# Patient Record
Sex: Male | Born: 1935 | Race: White | Hispanic: No | Marital: Married | Smoking: Former smoker
Health system: Southern US, Community
[De-identification: ages and names within clinical notes are randomized; demographics above are authoritative.]

## PROBLEM LIST (undated history)

## (undated) DIAGNOSIS — C160 Malignant neoplasm of cardia: Secondary | ICD-10-CM

## (undated) DIAGNOSIS — I10 Essential (primary) hypertension: Secondary | ICD-10-CM

## (undated) DIAGNOSIS — Z9889 Other specified postprocedural states: Secondary | ICD-10-CM

## (undated) DIAGNOSIS — I2510 Atherosclerotic heart disease of native coronary artery without angina pectoris: Secondary | ICD-10-CM

## (undated) DIAGNOSIS — R05 Cough: Secondary | ICD-10-CM

## (undated) DIAGNOSIS — I639 Cerebral infarction, unspecified: Secondary | ICD-10-CM

## (undated) DIAGNOSIS — E785 Hyperlipidemia, unspecified: Secondary | ICD-10-CM

## (undated) DIAGNOSIS — R011 Cardiac murmur, unspecified: Secondary | ICD-10-CM

## (undated) DIAGNOSIS — I499 Cardiac arrhythmia, unspecified: Secondary | ICD-10-CM

## (undated) DIAGNOSIS — IMO0001 Reserved for inherently not codable concepts without codable children: Secondary | ICD-10-CM

## (undated) DIAGNOSIS — G459 Transient cerebral ischemic attack, unspecified: Secondary | ICD-10-CM

## (undated) DIAGNOSIS — H3500 Unspecified background retinopathy: Secondary | ICD-10-CM

## (undated) DIAGNOSIS — H3530 Unspecified macular degeneration: Secondary | ICD-10-CM

## (undated) DIAGNOSIS — IMO0002 Reserved for concepts with insufficient information to code with codable children: Secondary | ICD-10-CM

## (undated) DIAGNOSIS — R112 Nausea with vomiting, unspecified: Secondary | ICD-10-CM

## (undated) HISTORY — PX: PERCUTANEOUS CORONARY STENT INTERVENTION (PCI-S): SHX6016

## (undated) HISTORY — DX: Unspecified background retinopathy: H35.00

## (undated) HISTORY — DX: Hyperlipidemia, unspecified: E78.5

## (undated) HISTORY — DX: Reserved for inherently not codable concepts without codable children: IMO0001

## (undated) HISTORY — DX: Cough: R05

## (undated) HISTORY — DX: Unspecified macular degeneration: H35.30

## (undated) HISTORY — DX: Transient cerebral ischemic attack, unspecified: G45.9

## (undated) HISTORY — DX: Reserved for concepts with insufficient information to code with codable children: IMO0002

## (undated) HISTORY — DX: Essential (primary) hypertension: I10

## (undated) HISTORY — PX: APPENDECTOMY: SHX54

## (undated) HISTORY — PX: EYE SURGERY: SHX253

---

## 1983-08-09 HISTORY — PX: CHOLECYSTECTOMY: SHX55

## 1993-08-08 DIAGNOSIS — I639 Cerebral infarction, unspecified: Secondary | ICD-10-CM

## 1993-08-08 HISTORY — DX: Cerebral infarction, unspecified: I63.9

## 1997-08-08 DIAGNOSIS — G459 Transient cerebral ischemic attack, unspecified: Secondary | ICD-10-CM

## 1997-08-08 HISTORY — DX: Transient cerebral ischemic attack, unspecified: G45.9

## 1997-11-28 ENCOUNTER — Ambulatory Visit (HOSPITAL_COMMUNITY): Admission: RE | Admit: 1997-11-28 | Discharge: 1997-11-28 | Payer: Self-pay | Admitting: Urology

## 1997-12-19 ENCOUNTER — Other Ambulatory Visit: Admission: RE | Admit: 1997-12-19 | Discharge: 1997-12-19 | Payer: Self-pay | Admitting: Urology

## 1999-06-30 ENCOUNTER — Ambulatory Visit (HOSPITAL_COMMUNITY): Admission: RE | Admit: 1999-06-30 | Discharge: 1999-06-30 | Payer: Self-pay | Admitting: Gastroenterology

## 1999-06-30 ENCOUNTER — Encounter (INDEPENDENT_AMBULATORY_CARE_PROVIDER_SITE_OTHER): Payer: Self-pay

## 1999-07-21 ENCOUNTER — Encounter (INDEPENDENT_AMBULATORY_CARE_PROVIDER_SITE_OTHER): Payer: Self-pay | Admitting: Specialist

## 1999-07-21 ENCOUNTER — Ambulatory Visit (HOSPITAL_COMMUNITY): Admission: RE | Admit: 1999-07-21 | Discharge: 1999-07-21 | Payer: Self-pay | Admitting: Gastroenterology

## 1999-08-19 ENCOUNTER — Other Ambulatory Visit: Admission: RE | Admit: 1999-08-19 | Discharge: 1999-08-19 | Payer: Self-pay | Admitting: Urology

## 1999-08-31 ENCOUNTER — Encounter (INDEPENDENT_AMBULATORY_CARE_PROVIDER_SITE_OTHER): Payer: Self-pay

## 1999-08-31 ENCOUNTER — Ambulatory Visit (HOSPITAL_COMMUNITY): Admission: RE | Admit: 1999-08-31 | Discharge: 1999-08-31 | Payer: Self-pay | Admitting: Gastroenterology

## 2000-05-26 ENCOUNTER — Encounter: Payer: Self-pay | Admitting: *Deleted

## 2000-05-26 ENCOUNTER — Ambulatory Visit (HOSPITAL_COMMUNITY): Admission: RE | Admit: 2000-05-26 | Discharge: 2000-05-26 | Payer: Self-pay | Admitting: *Deleted

## 2000-07-10 ENCOUNTER — Encounter: Payer: Self-pay | Admitting: Neurosurgery

## 2000-07-12 ENCOUNTER — Encounter: Payer: Self-pay | Admitting: Neurosurgery

## 2000-07-12 ENCOUNTER — Observation Stay (HOSPITAL_COMMUNITY): Admission: RE | Admit: 2000-07-12 | Discharge: 2000-07-13 | Payer: Self-pay | Admitting: Neurosurgery

## 2000-07-21 ENCOUNTER — Encounter: Payer: Self-pay | Admitting: Neurosurgery

## 2000-07-21 ENCOUNTER — Encounter: Admission: RE | Admit: 2000-07-21 | Discharge: 2000-07-21 | Payer: Self-pay | Admitting: Neurosurgery

## 2000-08-08 HISTORY — PX: CERVICAL SPINE SURGERY: SHX589

## 2000-09-05 ENCOUNTER — Encounter: Admission: RE | Admit: 2000-09-05 | Discharge: 2000-09-05 | Payer: Self-pay | Admitting: Neurosurgery

## 2000-09-05 ENCOUNTER — Encounter: Payer: Self-pay | Admitting: Neurosurgery

## 2000-09-19 ENCOUNTER — Encounter (INDEPENDENT_AMBULATORY_CARE_PROVIDER_SITE_OTHER): Payer: Self-pay

## 2000-09-19 ENCOUNTER — Encounter: Payer: Self-pay | Admitting: Internal Medicine

## 2000-09-19 ENCOUNTER — Ambulatory Visit (HOSPITAL_COMMUNITY): Admission: RE | Admit: 2000-09-19 | Discharge: 2000-09-19 | Payer: Self-pay | Admitting: Gastroenterology

## 2000-10-12 ENCOUNTER — Encounter: Admission: RE | Admit: 2000-10-12 | Discharge: 2000-10-12 | Payer: Self-pay | Admitting: Neurosurgery

## 2000-10-12 ENCOUNTER — Encounter: Payer: Self-pay | Admitting: Neurosurgery

## 2001-04-26 ENCOUNTER — Encounter: Admission: RE | Admit: 2001-04-26 | Discharge: 2001-04-26 | Payer: Self-pay | Admitting: Neurosurgery

## 2001-04-26 ENCOUNTER — Encounter: Payer: Self-pay | Admitting: Neurosurgery

## 2001-05-04 ENCOUNTER — Encounter: Payer: Self-pay | Admitting: Neurosurgery

## 2001-05-04 ENCOUNTER — Encounter: Admission: RE | Admit: 2001-05-04 | Discharge: 2001-05-04 | Payer: Self-pay | Admitting: Neurosurgery

## 2001-11-08 ENCOUNTER — Ambulatory Visit (HOSPITAL_COMMUNITY): Admission: RE | Admit: 2001-11-08 | Discharge: 2001-11-08 | Payer: Self-pay | Admitting: Gastroenterology

## 2001-11-08 ENCOUNTER — Encounter: Payer: Self-pay | Admitting: Internal Medicine

## 2002-11-22 ENCOUNTER — Encounter: Admission: RE | Admit: 2002-11-22 | Discharge: 2002-11-22 | Payer: Self-pay | Admitting: Family Medicine

## 2002-11-22 ENCOUNTER — Encounter: Payer: Self-pay | Admitting: Family Medicine

## 2005-01-25 ENCOUNTER — Encounter: Payer: Self-pay | Admitting: Internal Medicine

## 2005-01-25 ENCOUNTER — Ambulatory Visit (HOSPITAL_COMMUNITY): Admission: RE | Admit: 2005-01-25 | Discharge: 2005-01-25 | Payer: Self-pay | Admitting: Gastroenterology

## 2005-01-25 LAB — HM COLONOSCOPY

## 2005-06-17 ENCOUNTER — Encounter: Admission: RE | Admit: 2005-06-17 | Discharge: 2005-06-17 | Payer: Self-pay | Admitting: Family Medicine

## 2006-03-01 ENCOUNTER — Ambulatory Visit: Payer: Self-pay | Admitting: Family Medicine

## 2006-10-23 ENCOUNTER — Ambulatory Visit: Payer: Self-pay | Admitting: Family Medicine

## 2006-11-03 ENCOUNTER — Ambulatory Visit: Payer: Self-pay | Admitting: Family Medicine

## 2006-11-03 LAB — CONVERTED CEMR LAB
ALT: 39 units/L (ref 0–40)
AST: 35 units/L (ref 0–37)
BUN: 26 mg/dL — ABNORMAL HIGH (ref 6–23)
CO2: 33 meq/L — ABNORMAL HIGH (ref 19–32)
Calcium: 9.2 mg/dL (ref 8.4–10.5)
Chloride: 108 meq/L (ref 96–112)
Cholesterol: 191 mg/dL (ref 0–200)
Creatinine, Ser: 1.2 mg/dL (ref 0.4–1.5)
GFR calc Af Amer: 77 mL/min
GFR calc non Af Amer: 64 mL/min
Glucose, Bld: 99 mg/dL (ref 70–99)
HDL: 45.1 mg/dL (ref >39.0)
LDL Cholesterol: 128 mg/dL — ABNORMAL HIGH (ref 0–99)
Potassium: 4 meq/L (ref 3.5–5.1)
Sodium: 145 meq/L (ref 135–145)
Total CHOL/HDL Ratio: 4.2
Total CK: 320 units/L (ref 7–195)
Triglycerides: 90 mg/dL (ref 0–149)
VLDL: 18 mg/dL (ref 0–40)

## 2007-01-06 ENCOUNTER — Ambulatory Visit: Payer: Self-pay | Admitting: Family Medicine

## 2007-03-01 ENCOUNTER — Ambulatory Visit: Payer: Self-pay | Admitting: Family Medicine

## 2007-03-01 DIAGNOSIS — E785 Hyperlipidemia, unspecified: Secondary | ICD-10-CM | POA: Insufficient documentation

## 2007-03-01 DIAGNOSIS — I10 Essential (primary) hypertension: Secondary | ICD-10-CM | POA: Insufficient documentation

## 2007-03-01 LAB — CONVERTED CEMR LAB
BUN: 23 mg/dL (ref 6–23)
CO2: 34 meq/L — ABNORMAL HIGH (ref 19–32)
Calcium: 9.5 mg/dL (ref 8.4–10.5)
Chloride: 100 meq/L (ref 96–112)
Creatinine, Ser: 1.2 mg/dL (ref 0.4–1.5)
GFR calc Af Amer: 77 mL/min
GFR calc non Af Amer: 64 mL/min
Glucose, Bld: 120 mg/dL — ABNORMAL HIGH (ref 70–99)
Potassium: 3.2 meq/L — ABNORMAL LOW (ref 3.5–5.1)
Sodium: 142 meq/L (ref 135–145)

## 2007-03-02 ENCOUNTER — Telehealth (INDEPENDENT_AMBULATORY_CARE_PROVIDER_SITE_OTHER): Payer: Self-pay | Admitting: *Deleted

## 2007-03-09 ENCOUNTER — Ambulatory Visit: Payer: Self-pay | Admitting: Family Medicine

## 2007-03-09 ENCOUNTER — Telehealth (INDEPENDENT_AMBULATORY_CARE_PROVIDER_SITE_OTHER): Payer: Self-pay | Admitting: *Deleted

## 2007-03-09 LAB — CONVERTED CEMR LAB: Potassium: 3.9 meq/L (ref 3.5–5.1)

## 2007-04-04 ENCOUNTER — Ambulatory Visit: Payer: Self-pay | Admitting: Family Medicine

## 2007-05-07 ENCOUNTER — Ambulatory Visit: Payer: Self-pay | Admitting: Family Medicine

## 2007-05-07 DIAGNOSIS — F519 Sleep disorder not due to a substance or known physiological condition, unspecified: Secondary | ICD-10-CM | POA: Insufficient documentation

## 2007-05-07 LAB — CONVERTED CEMR LAB
BUN: 25 mg/dL — ABNORMAL HIGH (ref 6–23)
CO2: 34 meq/L — ABNORMAL HIGH (ref 19–32)
Calcium: 10 mg/dL (ref 8.4–10.5)
Chloride: 103 meq/L (ref 96–112)
Creatinine, Ser: 1.6 mg/dL — ABNORMAL HIGH (ref 0.4–1.5)
GFR calc Af Amer: 55 mL/min
GFR calc non Af Amer: 46 mL/min
Glucose, Bld: 141 mg/dL — ABNORMAL HIGH (ref 70–99)
Potassium: 4.2 meq/L (ref 3.5–5.1)
Sodium: 143 meq/L (ref 135–145)

## 2007-05-08 ENCOUNTER — Telehealth (INDEPENDENT_AMBULATORY_CARE_PROVIDER_SITE_OTHER): Payer: Self-pay | Admitting: *Deleted

## 2007-05-10 ENCOUNTER — Ambulatory Visit: Payer: Self-pay | Admitting: Family Medicine

## 2007-05-10 ENCOUNTER — Encounter: Admission: RE | Admit: 2007-05-10 | Discharge: 2007-05-10 | Payer: Self-pay | Admitting: Family Medicine

## 2007-05-10 LAB — CONVERTED CEMR LAB
Bilirubin Urine: NEGATIVE
Hemoglobin, Urine: NEGATIVE
Ketones, ur: NEGATIVE mg/dL
Leukocytes, UA: NEGATIVE
Nitrite: NEGATIVE
Protein, ur: NEGATIVE mg/dL
Specific Gravity, Urine: 1.016 (ref 1.005–1.03)
Urine Glucose: NEGATIVE mg/dL
Urobilinogen, UA: 0.2 (ref 0.0–1.0)
pH: 6 (ref 5.0–8.0)

## 2007-05-17 ENCOUNTER — Telehealth (INDEPENDENT_AMBULATORY_CARE_PROVIDER_SITE_OTHER): Payer: Self-pay | Admitting: *Deleted

## 2007-05-17 LAB — CONVERTED CEMR LAB
BUN: 30 mg/dL — ABNORMAL HIGH (ref 6–23)
CO2: 33 meq/L — ABNORMAL HIGH (ref 19–32)
Calcium: 9.6 mg/dL (ref 8.4–10.5)
Chloride: 104 meq/L (ref 96–112)
Creatinine, Ser: 1.5 mg/dL (ref 0.4–1.5)
Creatinine,U: 112.9 mg/dL
GFR calc Af Amer: 60 mL/min
GFR calc non Af Amer: 49 mL/min
Glucose, Bld: 111 mg/dL — ABNORMAL HIGH (ref 70–99)
Hgb A1c MFr Bld: 6.5 % — ABNORMAL HIGH (ref 4.6–6.0)
Microalb Creat Ratio: 10.6 mg/g (ref 0.0–30.0)
Microalb, Ur: 1.2 mg/dL (ref 0.0–1.9)
Potassium: 4.1 meq/L (ref 3.5–5.1)
Sodium: 142 meq/L (ref 135–145)

## 2007-06-06 ENCOUNTER — Ambulatory Visit: Payer: Self-pay | Admitting: Family Medicine

## 2007-06-07 DIAGNOSIS — E119 Type 2 diabetes mellitus without complications: Secondary | ICD-10-CM | POA: Insufficient documentation

## 2007-06-14 ENCOUNTER — Ambulatory Visit: Payer: Self-pay | Admitting: Family Medicine

## 2007-06-15 ENCOUNTER — Telehealth (INDEPENDENT_AMBULATORY_CARE_PROVIDER_SITE_OTHER): Payer: Self-pay | Admitting: Family Medicine

## 2007-06-15 LAB — CONVERTED CEMR LAB
ALT: 32 units/L (ref 0–53)
AST: 29 units/L (ref 0–37)
Cholesterol: 207 mg/dL (ref 0–200)
Direct LDL: 149.4 mg/dL
HDL: 39.6 mg/dL (ref >39.0)
Total CHOL/HDL Ratio: 5.2
Triglycerides: 134 mg/dL (ref 0–149)
VLDL: 27 mg/dL (ref 0–40)

## 2007-06-18 ENCOUNTER — Encounter (INDEPENDENT_AMBULATORY_CARE_PROVIDER_SITE_OTHER): Payer: Self-pay | Admitting: Family Medicine

## 2007-06-25 ENCOUNTER — Telehealth (INDEPENDENT_AMBULATORY_CARE_PROVIDER_SITE_OTHER): Payer: Self-pay | Admitting: *Deleted

## 2007-06-25 ENCOUNTER — Ambulatory Visit: Payer: Self-pay | Admitting: Cardiology

## 2007-07-06 ENCOUNTER — Ambulatory Visit: Payer: Self-pay | Admitting: Family Medicine

## 2007-07-09 ENCOUNTER — Encounter: Admission: RE | Admit: 2007-07-09 | Discharge: 2007-07-19 | Payer: Self-pay | Admitting: Family Medicine

## 2007-07-09 ENCOUNTER — Encounter (INDEPENDENT_AMBULATORY_CARE_PROVIDER_SITE_OTHER): Payer: Self-pay | Admitting: Family Medicine

## 2007-07-12 LAB — CONVERTED CEMR LAB: Total CK: 215 units/L (ref 7–195)

## 2007-07-25 ENCOUNTER — Telehealth (INDEPENDENT_AMBULATORY_CARE_PROVIDER_SITE_OTHER): Payer: Self-pay | Admitting: *Deleted

## 2007-07-25 ENCOUNTER — Ambulatory Visit: Payer: Self-pay | Admitting: Family Medicine

## 2007-08-06 ENCOUNTER — Telehealth (INDEPENDENT_AMBULATORY_CARE_PROVIDER_SITE_OTHER): Payer: Self-pay | Admitting: *Deleted

## 2007-08-10 ENCOUNTER — Telehealth (INDEPENDENT_AMBULATORY_CARE_PROVIDER_SITE_OTHER): Payer: Self-pay | Admitting: *Deleted

## 2007-08-20 ENCOUNTER — Encounter (INDEPENDENT_AMBULATORY_CARE_PROVIDER_SITE_OTHER): Payer: Self-pay | Admitting: Family Medicine

## 2007-09-06 ENCOUNTER — Ambulatory Visit: Payer: Self-pay | Admitting: Family Medicine

## 2007-09-07 LAB — CONVERTED CEMR LAB: Hgb A1c MFr Bld: 6 % (ref 4.6–6.0)

## 2007-09-10 ENCOUNTER — Encounter (INDEPENDENT_AMBULATORY_CARE_PROVIDER_SITE_OTHER): Payer: Self-pay | Admitting: *Deleted

## 2007-09-27 ENCOUNTER — Ambulatory Visit: Payer: Self-pay | Admitting: Family Medicine

## 2007-09-27 ENCOUNTER — Telehealth (INDEPENDENT_AMBULATORY_CARE_PROVIDER_SITE_OTHER): Payer: Self-pay | Admitting: *Deleted

## 2007-09-27 ENCOUNTER — Encounter (INDEPENDENT_AMBULATORY_CARE_PROVIDER_SITE_OTHER): Payer: Self-pay | Admitting: *Deleted

## 2007-09-27 LAB — CONVERTED CEMR LAB
ALT: 30 units/L (ref 0–53)
AST: 28 units/L (ref 0–37)
Albumin: 4 g/dL (ref 3.5–5.2)
Alkaline Phosphatase: 59 units/L (ref 39–117)
Bilirubin, Direct: 0.2 mg/dL (ref 0.0–0.3)
Cholesterol: 125 mg/dL (ref 0–200)
HDL: 44.2 mg/dL (ref >39.0)
LDL Cholesterol: 70 mg/dL (ref 0–99)
Total Bilirubin: 1 mg/dL (ref 0.3–1.2)
Total CHOL/HDL Ratio: 2.8
Total Protein: 7.4 g/dL (ref 6.0–8.3)
Triglycerides: 55 mg/dL (ref 0–149)
VLDL: 11 mg/dL (ref 0–40)

## 2007-09-28 ENCOUNTER — Encounter (INDEPENDENT_AMBULATORY_CARE_PROVIDER_SITE_OTHER): Payer: Self-pay | Admitting: Family Medicine

## 2008-01-24 ENCOUNTER — Telehealth (INDEPENDENT_AMBULATORY_CARE_PROVIDER_SITE_OTHER): Payer: Self-pay | Admitting: *Deleted

## 2008-01-30 ENCOUNTER — Ambulatory Visit: Payer: Self-pay | Admitting: Internal Medicine

## 2008-02-01 ENCOUNTER — Ambulatory Visit: Payer: Self-pay | Admitting: Internal Medicine

## 2008-02-06 ENCOUNTER — Telehealth (INDEPENDENT_AMBULATORY_CARE_PROVIDER_SITE_OTHER): Payer: Self-pay | Admitting: *Deleted

## 2008-02-06 LAB — CONVERTED CEMR LAB
BUN: 25 mg/dL — ABNORMAL HIGH (ref 6–23)
CO2: 31 meq/L (ref 19–32)
Calcium: 9.5 mg/dL (ref 8.4–10.5)
Chloride: 104 meq/L (ref 96–112)
Cholesterol: 218 mg/dL (ref 0–200)
Creatinine, Ser: 1.4 mg/dL (ref 0.4–1.5)
Direct LDL: 149.3 mg/dL
GFR calc Af Amer: 64 mL/min
GFR calc non Af Amer: 53 mL/min
Glucose, Bld: 100 mg/dL — ABNORMAL HIGH (ref 70–99)
HDL: 47.3 mg/dL (ref >39.0)
Hgb A1c MFr Bld: 6.2 % — ABNORMAL HIGH (ref 4.6–6.0)
Potassium: 3.8 meq/L (ref 3.5–5.1)
Sodium: 141 meq/L (ref 135–145)
TSH: 1.89 microintl units/mL (ref 0.35–5.50)
Total CHOL/HDL Ratio: 4.6
Triglycerides: 73 mg/dL (ref 0–149)
VLDL: 15 mg/dL (ref 0–40)

## 2008-02-26 ENCOUNTER — Ambulatory Visit: Payer: Self-pay | Admitting: Internal Medicine

## 2008-02-26 ENCOUNTER — Telehealth (INDEPENDENT_AMBULATORY_CARE_PROVIDER_SITE_OTHER): Payer: Self-pay | Admitting: *Deleted

## 2008-03-26 ENCOUNTER — Encounter: Payer: Self-pay | Admitting: Internal Medicine

## 2008-05-28 ENCOUNTER — Encounter (INDEPENDENT_AMBULATORY_CARE_PROVIDER_SITE_OTHER): Payer: Self-pay | Admitting: *Deleted

## 2008-06-23 ENCOUNTER — Ambulatory Visit: Payer: Self-pay | Admitting: Internal Medicine

## 2008-06-25 ENCOUNTER — Encounter (INDEPENDENT_AMBULATORY_CARE_PROVIDER_SITE_OTHER): Payer: Self-pay | Admitting: *Deleted

## 2008-06-25 LAB — CONVERTED CEMR LAB
Basophils Absolute: 0 10*3/uL (ref 0.0–0.1)
Basophils Relative: 0.6 % (ref 0.0–3.0)
Creatinine,U: 164.8 mg/dL
Eosinophils Absolute: 0.1 10*3/uL (ref 0.0–0.7)
Eosinophils Relative: 1.8 % (ref 0.0–5.0)
HCT: 42.7 % (ref 39.0–52.0)
Hemoglobin: 14.7 g/dL (ref 13.0–17.0)
Hgb A1c MFr Bld: 6 % (ref 4.6–6.0)
Lymphocytes Relative: 33.6 % (ref 12.0–46.0)
MCHC: 34.3 g/dL (ref 30.0–36.0)
MCV: 93.4 fL (ref 78.0–100.0)
Microalb Creat Ratio: 6.7 mg/g (ref 0.0–30.0)
Microalb, Ur: 1.1 mg/dL (ref 0.0–1.9)
Monocytes Absolute: 0.4 10*3/uL (ref 0.1–1.0)
Monocytes Relative: 7.2 % (ref 3.0–12.0)
Neutro Abs: 3.6 10*3/uL (ref 1.4–7.7)
Neutrophils Relative %: 56.8 % (ref 43.0–77.0)
Platelets: 193 10*3/uL (ref 150–400)
RBC: 4.57 M/uL (ref 4.22–5.81)
RDW: 12.6 % (ref 11.5–14.6)
WBC: 6.2 10*3/uL (ref 4.5–10.5)

## 2008-09-30 ENCOUNTER — Ambulatory Visit: Payer: Self-pay | Admitting: Internal Medicine

## 2008-10-02 LAB — CONVERTED CEMR LAB
BUN: 27 mg/dL — ABNORMAL HIGH (ref 6–23)
CO2: 33 meq/L — ABNORMAL HIGH (ref 19–32)
Calcium: 9.8 mg/dL (ref 8.4–10.5)
Chloride: 101 meq/L (ref 96–112)
Creatinine, Ser: 1.1 mg/dL (ref 0.4–1.5)
GFR calc Af Amer: 85 mL/min
GFR calc non Af Amer: 70 mL/min
Glucose, Bld: 100 mg/dL — ABNORMAL HIGH (ref 70–99)
Hgb A1c MFr Bld: 6.1 % — ABNORMAL HIGH (ref 4.6–6.0)
Potassium: 3.8 meq/L (ref 3.5–5.1)
Sodium: 142 meq/L (ref 135–145)

## 2008-10-16 ENCOUNTER — Encounter: Payer: Self-pay | Admitting: Internal Medicine

## 2009-01-30 ENCOUNTER — Ambulatory Visit: Payer: Self-pay | Admitting: Internal Medicine

## 2009-02-02 LAB — CONVERTED CEMR LAB: Hgb A1c MFr Bld: 6.3 % (ref 4.6–6.5)

## 2009-03-30 ENCOUNTER — Encounter: Payer: Self-pay | Admitting: Internal Medicine

## 2009-03-30 LAB — HM DIABETES EYE EXAM

## 2009-04-07 ENCOUNTER — Ambulatory Visit: Payer: Self-pay | Admitting: Internal Medicine

## 2009-04-28 ENCOUNTER — Ambulatory Visit: Payer: Self-pay | Admitting: Internal Medicine

## 2009-04-28 LAB — CONVERTED CEMR LAB: Rapid Strep: NEGATIVE

## 2009-04-29 ENCOUNTER — Ambulatory Visit: Payer: Self-pay | Admitting: Internal Medicine

## 2009-05-05 ENCOUNTER — Telehealth: Payer: Self-pay | Admitting: Internal Medicine

## 2009-05-05 LAB — CONVERTED CEMR LAB
Basophils Absolute: 0 10*3/uL (ref 0.0–0.1)
Basophils Relative: 0.3 % (ref 0.0–3.0)
Eosinophils Absolute: 0.2 10*3/uL (ref 0.0–0.7)
Eosinophils Relative: 2.9 % (ref 0.0–5.0)
HCT: 41.2 % (ref 39.0–52.0)
Hemoglobin: 14.1 g/dL (ref 13.0–17.0)
Lymphocytes Relative: 35 % (ref 12.0–46.0)
Lymphs Abs: 2.3 10*3/uL (ref 0.7–4.0)
MCHC: 34.3 g/dL (ref 30.0–36.0)
MCV: 91.7 fL (ref 78.0–100.0)
Monocytes Absolute: 0.5 10*3/uL (ref 0.1–1.0)
Monocytes Relative: 8 % (ref 3.0–12.0)
Neutro Abs: 3.7 10*3/uL (ref 1.4–7.7)
Neutrophils Relative %: 53.8 % (ref 43.0–77.0)
Platelets: 207 10*3/uL (ref 150.0–400.0)
RBC: 4.49 M/uL (ref 4.22–5.81)
RDW: 12.4 % (ref 11.5–14.6)
Sed Rate: 13 mm/hr (ref 0–22)
WBC: 6.7 10*3/uL (ref 4.5–10.5)

## 2009-08-08 DIAGNOSIS — R053 Chronic cough: Secondary | ICD-10-CM

## 2009-08-08 HISTORY — DX: Chronic cough: R05.3

## 2009-09-03 ENCOUNTER — Encounter: Payer: Self-pay | Admitting: Internal Medicine

## 2009-09-04 ENCOUNTER — Encounter: Payer: Self-pay | Admitting: Internal Medicine

## 2009-11-23 ENCOUNTER — Ambulatory Visit: Payer: Self-pay | Admitting: Family Medicine

## 2009-11-23 DIAGNOSIS — R05 Cough: Secondary | ICD-10-CM

## 2009-11-24 ENCOUNTER — Ambulatory Visit: Payer: Self-pay | Admitting: Family Medicine

## 2009-11-24 LAB — CONVERTED CEMR LAB: Hgb A1c MFr Bld: 6.1 % (ref 4.6–6.5)

## 2009-12-16 ENCOUNTER — Ambulatory Visit: Payer: Self-pay | Admitting: Internal Medicine

## 2009-12-16 DIAGNOSIS — H409 Unspecified glaucoma: Secondary | ICD-10-CM | POA: Insufficient documentation

## 2009-12-18 ENCOUNTER — Ambulatory Visit: Payer: Self-pay | Admitting: Internal Medicine

## 2009-12-18 ENCOUNTER — Telehealth (INDEPENDENT_AMBULATORY_CARE_PROVIDER_SITE_OTHER): Payer: Self-pay | Admitting: *Deleted

## 2009-12-18 ENCOUNTER — Telehealth: Payer: Self-pay | Admitting: Internal Medicine

## 2009-12-18 LAB — CONVERTED CEMR LAB: Potassium: 4.9 meq/L (ref 3.5–5.1)

## 2009-12-21 LAB — CONVERTED CEMR LAB
BUN: 32 mg/dL — ABNORMAL HIGH (ref 6–23)
CO2: 32 meq/L (ref 19–32)
Calcium: 9.6 mg/dL (ref 8.4–10.5)
Chloride: 104 meq/L (ref 96–112)
Creatinine, Ser: 1.3 mg/dL (ref 0.4–1.5)
GFR calc non Af Amer: 56.51 mL/min (ref >60)
Glucose, Bld: 114 mg/dL — ABNORMAL HIGH (ref 70–99)
Potassium: 5.8 meq/L — ABNORMAL HIGH (ref 3.5–5.1)
Sodium: 144 meq/L (ref 135–145)
TSH: 1.55 microintl units/mL (ref 0.35–5.50)

## 2010-01-07 ENCOUNTER — Ambulatory Visit: Payer: Self-pay | Admitting: Internal Medicine

## 2010-01-11 LAB — CONVERTED CEMR LAB: IgE (Immunoglobulin E), Serum: 20.3 intl units/mL (ref 0.0–180.0)

## 2010-01-21 ENCOUNTER — Encounter: Payer: Self-pay | Admitting: Internal Medicine

## 2010-02-15 ENCOUNTER — Ambulatory Visit: Payer: Self-pay | Admitting: Internal Medicine

## 2010-03-01 ENCOUNTER — Ambulatory Visit (HOSPITAL_COMMUNITY): Admission: RE | Admit: 2010-03-01 | Discharge: 2010-03-01 | Payer: Self-pay | Admitting: Internal Medicine

## 2010-03-01 ENCOUNTER — Encounter: Payer: Self-pay | Admitting: Internal Medicine

## 2010-03-05 ENCOUNTER — Telehealth: Payer: Self-pay | Admitting: Internal Medicine

## 2010-03-16 ENCOUNTER — Ambulatory Visit: Payer: Self-pay | Admitting: Internal Medicine

## 2010-04-01 ENCOUNTER — Encounter: Payer: Self-pay | Admitting: Internal Medicine

## 2010-04-23 ENCOUNTER — Ambulatory Visit: Payer: Self-pay | Admitting: Internal Medicine

## 2010-04-28 ENCOUNTER — Telehealth (INDEPENDENT_AMBULATORY_CARE_PROVIDER_SITE_OTHER): Payer: Self-pay | Admitting: *Deleted

## 2010-08-12 ENCOUNTER — Ambulatory Visit: Admit: 2010-08-12 | Payer: Self-pay | Admitting: Internal Medicine

## 2010-09-05 LAB — CONVERTED CEMR LAB
BUN: 26 mg/dL — ABNORMAL HIGH (ref 6–23)
CO2: 32 meq/L (ref 19–32)
Calcium: 9.5 mg/dL (ref 8.4–10.5)
Chloride: 103 meq/L (ref 96–112)
Cholesterol: 226 mg/dL — ABNORMAL HIGH (ref 0–200)
Creatinine, Ser: 1.4 mg/dL (ref 0.4–1.5)
Creatinine,U: 259.6 mg/dL
Direct LDL: 162.9 mg/dL
GFR calc non Af Amer: 52.75 mL/min (ref >60)
Glucose, Bld: 103 mg/dL — ABNORMAL HIGH (ref 70–99)
HDL: 53.5 mg/dL (ref >39.00)
Hgb A1c MFr Bld: 6.4 % (ref 4.6–6.5)
Microalb Creat Ratio: 1.4 mg/g (ref 0.0–30.0)
Microalb, Ur: 3.6 mg/dL — ABNORMAL HIGH (ref 0.0–1.9)
Potassium: 4.1 meq/L (ref 3.5–5.1)
Sodium: 142 meq/L (ref 135–145)
Total CHOL/HDL Ratio: 4
Triglycerides: 82 mg/dL (ref 0.0–149.0)
VLDL: 16.4 mg/dL (ref 0.0–40.0)

## 2010-09-09 NOTE — Assessment & Plan Note (Signed)
Summary: TICK BITE, CHECK A1C///SPH   Vital Signs:  Patient profile:   76 year old male Weight:      179.2 pounds Temp:     99.2 degrees F BP sitting:   100 / 70  Vitals Entered By: Shary Decamp (November 23, 2009 1:10 PM) CC: removed tick from chest on 4/14, chest red, irritated   History of Present Illness: 76 yo man here today for tick bite.  occurred 4/14.  pt's wife pulled tick off in 2 pieces.  using neosporin.  area itches and burns- keeping pt awake.  pt reports it was a deer tick.    denies HAs, N/V, body aches, fevers.  both brothers had Lyme Dz.    overdue for A1C.  would like that done today.  has appt upcoming w/ PCP to discuss results.  cough- reports persistant cough since Christmas.  reminds pt of when he had histoplasmosis 'years ago'.  spit into a kleenex and sputum had pink tinge.  2 siblings w/ lung cancer- 1 smoked, 1 did not.  cough is typically productive.  remote hx of tobacco use- quit smoking 40 yrs ago.  no fevers, chills, recent wt loss, foreign travel.    Current Medications (verified): 1)  Triamterene-Hctz 37.5-25 Mg  Caps (Triamterene-Hctz) .Marland Kitchen.. 1 By Mouth Once Daily- Cpx and Labs Due Now 2)  Freestyle Lite   Strp (Glucose Blood) .... Use As Directed Three Times Daily 3)  Freestyle Lancets   Misc (Lancets) .... Use As Directed Daily 4)  Basa 5)  Avodart 0.5 Mg Caps (Dutasteride) .... Per Urology  Allergies (verified): 1)  ! * Zetia 2)  ! Lipitor 3)  ! * Lovastatin  Past History:  Past Medical History: Hypertension Hyperlipidemia: diet controlled , statins and zetia  intolerant  Diabetes mellitus, type II Transient ischemic attack, hx of (1999) hx of histoplasmosis  Family History: CAD - no HTN - M, F, bro, sis DM - F, bro, sis, grandparents stroke - no colon Ca - F prostate Ca - no lung cancer- brother, sister  Review of Systems      See HPI  Physical Exam  General:  alert and well-developed.   Neck:  No deformities, masses, or  tenderness noted. Lungs:  normal respiratory effort, no intercostal retractions, no accessory muscle use, and normal breath sounds.   Heart:  normal rate, regular rhythm, and no murmur.   Skin:  3-4 cm area of erythema surrounding central scab on R clavicle Cervical Nodes:  No lymphadenopathy noted   Impression & Recommendations:  Problem # 1:  TICK BITE (ICD-E906.4) Assessment New no evidence of retained parts.  + cellulitis surrounding bite site.  low grade temp.  will start doxy for both cellulitis and possible Lyme/RMSF.  reviewed supportive care and red flags that should prompt return.  Pt expresses understanding and is in agreement w/ this plan.  Problem # 2:  COUGH (ICD-786.2) Assessment: New pt w/ 4+ months of cough.  hx of histoplasmosis and family hx of lung CA.  no other URI or constitutional complaints.  check CXR to r/o mass or active process.  lung exam WNL today. Orders: T-2 View CXR (71020TC)  Problem # 3:  DIABETES MELLITUS, TYPE II (ICD-250.00) Assessment: Unchanged due for A1C. Orders: Venipuncture (16109) TLB-A1C / Hgb A1C (Glycohemoglobin) (83036-A1C)  Complete Medication List: 1)  Triamterene-hctz 37.5-25 Mg Caps (Triamterene-hctz) .Marland Kitchen.. 1 by mouth once daily- cpx and labs due now 2)  Freestyle Lite Strp (Glucose blood) .Marland KitchenMarland KitchenMarland Kitchen  Use as directed three times daily 3)  Freestyle Lancets Misc (Lancets) .... Use as directed daily 4)  Basa  5)  Avodart 0.5 Mg Caps (Dutasteride) .... Per urology 6)  Doxycycline Hyclate 100 Mg Caps (Doxycycline hyclate) .... Take 1 tab twice a day  Patient Instructions: 1)  Please keep your follow up appt with Dr Drue Novel on May 18th 2)  Go to [address] to get your chest xray 3)  We'll notify you of your lab results 4)  Take the doxycycline as directed- take with food 5)  Call with any questions or concerns 6)  Happy Easter! Prescriptions: DOXYCYCLINE HYCLATE 100 MG CAPS (DOXYCYCLINE HYCLATE) Take 1 tab twice a day  #28 x 0   Entered  and Authorized by:   Neena Rhymes MD   Signed by:   Neena Rhymes MD on 11/23/2009   Method used:   Electronically to        [facility] [address] [id number]* (retail)       [address]       [place], [place]  [address]       Ph: [phone]       Fax: [phone]   RxID:   [id number]

## 2010-09-09 NOTE — Letter (Signed)
Summary: CMN for Diabetes Supplies/[facility]  CMN for Diabetes Supplies/[facility]   Imported By: Lanelle Bal 09/09/2009 10:15:43  _____________________________________________________________________  External Attachment:    Type:   Image     Comment:   External Document

## 2010-09-09 NOTE — Assessment & Plan Note (Signed)
Summary: FU/WANTS A1C CHECK/KDC   Vital Signs:  Patient profile:   76 year old male Height:      71 inches Weight:      177.50 pounds BMI:     24.85 Temp:     98.2 degrees F oral Pulse rate:   78 / minute BP sitting:   120 / 60  Vitals Entered By: Kandice Hams (Dec 16, 2009 12:47 PM) CC: C/O CONTINUED COUGH SINCE JANUARY   History of Present Illness: routine office visit feels well except for continue  cough since January 2011 occ.  he produces sputum, no frank hemoptysis feels well, no systemic symptoms  Allergies: 1)  ! * Zetia 2)  ! Lipitor 3)  ! * Lovastatin  Past History:  Past Medical History: Hypertension Hyperlipidemia: diet controlled , statins and zetia  intolerant  Diabetes mellitus, type II (+) retinopathy and mild glaucoma Transient ischemic attack, hx of (1999) hx of histoplasmosis  Past Surgical History: Reviewed history from 01/30/2008 and no changes required. Cspine surgery (plate) Cholecystectomy  Social History: Married Retired 2 children tobacco- none in 40 years   Review of Systems       No GERD symptoms no postnasal dripping no URI type symptoms saw his  ophthalmologist recently, he does have retinopathy, mild glaucoma, but is overall stable  Physical Exam  General:  alert and well-developed.   Neck:  no masses.   Lungs:  normal respiratory effort, no intercostal retractions, no accessory muscle use, and normal breath sounds.   Heart:  normal rate, regular rhythm, no murmur, and no gallop.   Extremities:  no edema   Impression & Recommendations:  Problem # 1:  COUGH (ICD-786.2)  persistent cough, review of systems essentially negative, not on ACE inhibitors. Etiology not clear recent chest x-ray normal Plan: Pulmonary referral  Orders: Pulmonary Referral (Pulmonary)  Problem # 2:  GLAUCOMA (ICD-365.9) sees the ophthalmologist routinely  Problem # 3:  DIABETES MELLITUS, TYPE II (ICD-250.00) well-controlled on  diet does have mild retinopathy and mild glaucoma in the eyes Labs Reviewed: Creat: 1.1 (09/30/2008)       Reviewed HgBA1c results: 6.1 (11/23/2009)  6.3 (01/30/2009)  Problem # 4:  HYPERLIPIDEMIA (ICD-272.4)  Labs Reviewed: SGOT: 28 (09/27/2007)   SGPT: 30 (09/27/2007)   HDL:47.3 (02/01/2008), 44.2 (09/27/2007)  LDL:DEL (02/01/2008), 70 (14/78/2956)  Chol:218 (02/01/2008), 125 (09/27/2007)  Trig:73 (02/01/2008), 55 (09/27/2007)  Problem # 5:  HYPERTENSION (ICD-401.9)  at goal  His updated medication list for this problem includes:    Triamterene-hctz 37.5-25 Mg Caps (Triamterene-hctz) .Marland Kitchen... 1 by mouth once daily- cpx and labs due now  BP today: 120/60 Prior BP: 100/70 (11/23/2009)  Labs Reviewed: K+: 3.8 (09/30/2008) Creat: : 1.1 (09/30/2008)   Chol: 218 (02/01/2008)   HDL: 47.3 (02/01/2008)   LDL: DEL (02/01/2008)   TG: 73 (02/01/2008)  Orders: Venipuncture (21308) TLB-BMP (Basic Metabolic Panel-BMET) (80048-METABOL) TLB-TSH (Thyroid Stimulating Hormone) (84443-TSH)  Complete Medication List: 1)  Triamterene-hctz 37.5-25 Mg Caps (Triamterene-hctz) .Marland Kitchen.. 1 by mouth once daily- cpx and labs due now 2)  Freestyle Lite Strp (Glucose blood) .... Use as directed three times daily 3)  Freestyle Lancets Misc (Lancets) .... Use as directed daily 4)  Basa  5)  Avodart 0.5 Mg Caps (Dutasteride) .... Per urology 6)  Finasteride 5 Mg Tabs (Finasteride) .Marland Kitchen.. 1 by mouth once daily  Patient Instructions: 1)  Please schedule a follow-up appointment in 4 months (fasting, yearly checkup)

## 2010-09-09 NOTE — Progress Notes (Signed)
Summary: neg MCT  Phone Note Outgoing Call   Call placed by: Vernie Murders,  March 05, 2010 9:33 AM Call placed to: Patient Summary of Call: Call report to pt- MCT neg, no change in recs. Results in scan folder. Spoke with pt and notified of results/recs per MW. Initial call taken by: Vernie Murders,  March 05, 2010 9:32 AM

## 2010-09-09 NOTE — Assessment & Plan Note (Signed)
Summary: Pulmonary/ new pt eval for cough c/w gerd or uacs   Visit Type:  Initial Consult Copy to:  Dr. Drue Novel Primary Provider/Referring Provider:  Dr. Drue Novel  CC:  Cough.  History of Present Illness: 43 yowm quit smoking 1976 no respiratory problems then developed year round sneezing on a daily basis x 2007 typically in afternoons.   January 07, 2010 cc abrupt onset cough with uri around the first of the 2011, present daily, seems worse sitting still after supper resolves sleeping and when outdoors and active.  cough is productive.  Still sneezing. Pt denies any significant sore throat, dysphagia, itching, sneezing,  nasal congestion or excess secretions,  fever, chills, sweats, unintended wt loss, pleuritic or exertional cp, hempoptysis, change in activity tolerance  orthopnea pnd or leg swelling Pt also denies any obvious fluctuation in symptoms with weather or environmental change or other alleviating or aggravating factors other than as discussed.     Anticoagulation Management History:      Positive risk factors for bleeding include an age of 104 years or older and presence of serious comorbidities.  The bleeding index is 'intermediate risk'.  Positive CHADS2 values include History of HTN and History of Diabetes.  Negative CHADS2 values include Age > 51 years old.     Current Medications (verified): 1)  Triamterene-Hctz 37.5-25 Mg  Caps (Triamterene-Hctz) .Marland Kitchen.. 1 By Mouth Once Daily- Cpx and Labs Due Now 2)  Freestyle Lite   Strp (Glucose Blood) .... Use As Directed Three Times Daily 3)  Freestyle Lancets   Misc (Lancets) .... Use As Directed Daily 4)  Aspirin 81 Mg Tbec (Aspirin) .Marland Kitchen.. 1 Once Daily 5)  Finasteride 5 Mg Tabs (Finasteride) .Marland Kitchen.. 1 By Mouth Once Daily  Allergies (verified): 1)  ! * Zetia 2)  ! Lipitor 3)  ! * Lovastatin  Past History:  Past Medical History: Hypertension Hyperlipidemia: diet controlled , statins and zetia  intolerant  Diabetes mellitus, type II (+)  retinopathy and mild glaucoma Transient ischemic attack, hx of (1999) hx of histoplasmosis Chronic cough, onset Jan 2011     - allergy profile January 07, 2010 >>  Family History: CAD - no HTN - M, F, bro, sis DM - F, bro, sis, grandparents stroke - no colon Ca - F prostate Ca - no lung cancer- brother, sister neg atopy or asthma  Social History: Married Retired 2 children Former smoker.  Quit in 1976.  Smoked 1 ppd x 18 yrs  Review of Systems       The patient complains of productive cough and sneezing.  The patient denies shortness of breath with activity, shortness of breath at rest, non-productive cough, coughing up blood, chest pain, irregular heartbeats, acid heartburn, indigestion, loss of appetite, weight change, abdominal pain, difficulty swallowing, sore throat, tooth/dental problems, headaches, nasal congestion/difficulty breathing through nose, itching, ear ache, anxiety, depression, hand/feet swelling, joint stiffness or pain, rash, change in color of mucus, and fever.    Vital Signs:  Patient profile:   76 year old male Weight:      179 pounds O2 Sat:      97 % on Room air Temp:     98.0 degrees F oral Pulse rate:   84 / minute BP sitting:   142 / 84  (left arm)  Vitals Entered By: Vernie Murders (January 07, 2010 11:39 AM)  O2 Flow:  Room air  Physical Exam  Additional Exam:  amb wm nad with occ throat clearling  177 > 175 January 07, 2010  HEENT: nl dentition, turbinates, and orophanx. Nl external ear canals without cough reflex NECK :  without JVD/Nodes/TM/ nl carotid upstrokes bilaterally LUNGS: no acc muscle use, clear to A and P bilaterally without cough on insp or exp maneuvers CV:  RRR  no s3 or murmur or increase in P2, no edema  ABD:  soft and nontender with nl excursion in the supine position. No bruits or organomegaly, bowel sounds nl MS:  warm without deformities, calf tenderness, cyanosis or clubbing SKIN: warm and dry without lesions   NEURO:   alert, approp, no deficits     CXR  Procedure date:  11/24/2009  Findings:      Comparison: Chest 06/17/2005.   Findings: Calcified granuloma is again seen in the right upper lobe. Calcified right hilar lymph nodes again noted.  Lungs otherwise clear.  Heart size normal.  No effusion.   IMPRESSION: No acute disease.  Stable compared to prior exam.  Impression & Recommendations:  Problem # 1:  COUGH (ICD-786.2)  Orders: T-Allergy Profile [place], [place], [place], [facility], [place] [phone]) New Patient Level V [phone])  The most common causes of chronic cough in immunocompetent adults include: upper airway cough syndrome (UACS), previously referred to as postnasal drip syndrome,  caused by variety of rhinosinus conditions; (2) asthma; (3) GERD; (4) chronic bronchitis from cigarette smoking or other inhaled environmental irritants; (5) nonasthmatic eosinophilic bronchitis; and (6) bronchiectasis. These conditions, singly or in combination, have accounted for up to 94% of the causes of chronic cough in prospective studies.   most likely this is  Classic Upper airway cough syndrome, so named because it's frequently impossible to sort out how much is  CR/sinusitis with freq throat clearing (which can be related to primary GERD)   vs  causing  secondary extra esophageal GERD from wide swings in gastric pressure that occur with throat clearing, promoting self use of mint and menthol lozenges that reduce the lower esophageal sphincter tone and exacerbate the problem further These are the same pts who not infrequently have failed to tolerate ace inhibitors,  dry powder inhalers or biphosphonates or report having reflux symptoms that don't respond to standard doses of PPI   For now rx as GERD since occurs after supper never sleeping or with exercise.  The standardized cough guidelines recently published in Chest are a 14 step process, not a single office visit,  and are intended  to address this problem  logically,  with an alogrithm dependent on response to each progressive step  to determine a specific diagnosis with  minimal addtional testing needed. Therefore if compliance is an issue this empiric standardized approach simply won't work.   Medications Added to Medication List This Visit: 1)  Aspirin 81 Mg Tbec (Aspirin) .Marland Kitchen.. 1 once daily 2)  Dexilant 60 Mg Cpdr (Dexlansoprazole) .... Take  one 30-60 min before first meal of the day  Patient Instructions: 1)  GERD (REFLUX)  is a common cause of respiratory symptoms. It commonly presents without heartburn and can be treated with medication, but also with lifestyle changes including avoidance of late meals, excessive alcohol, smoking cessation, and avoid fatty foods, chocolate, peppermint, colas, red wine, and acidic juices such as orange juice. NO MINT OR MENTHOL PRODUCTS SO NO COUGH DROPS  2)  USE SUGARLESS CANDY INSTEAD (jolley ranchers)  3)  NO OIL BASED VITAMINS  4)  Dexilant 60 Take  one 30-60 min before first meal of the day  5)  and pepcid 20 mg one at bedtime  6)  Please schedule a follow-up appointment in 4 weeks, sooner if needed

## 2010-09-09 NOTE — Letter (Signed)
Summary: CMN for Diabetes Supplies/[facility]  CMN for [facility] Supplies/[facility]   Imported By: Lanelle Bal 09/10/2009 12:35:19  _____________________________________________________________________  External Attachment:    Type:   Image     Comment:   External Document

## 2010-09-09 NOTE — Progress Notes (Signed)
Summary: elevated K  Phone Note Outgoing Call Call back at [facility] Phone [phone]   Summary of Call: K 5.8 -- pt will go to [facility] today to have STAT K Shary Decamp  Dec 18, 2009 11:33 AM

## 2010-09-09 NOTE — Progress Notes (Signed)
Summary: elevated K  Phone Note Call from Patient   Summary of Call: Patient states that he does not want to have K rechecked today because he has been eating a lot of almonds lately & feels like that it why his potassium is elevated.  He would like to hold almonds x 1 week & recheck.  Advised pt why having elevated K was dangerous & the importance of rechecking it...Marland KitchenMarland KitchenMarland Kitchen  patient requested that I speak to Dr. Alanson Aly [facility]  Dec 18, 2009 12:08 PM   Follow-up for Phone Call        discussed with pt that Dr. Drue Novel really wants K rechecked today Shary Decamp  Dec 18, 2009 12:50 PM

## 2010-09-09 NOTE — Assessment & Plan Note (Signed)
Summary: CPX & LAB/CBS   Vital Signs:  Patient profile:   76 year old male Weight:      173.50 pounds Pulse rate:   71 / minute Pulse rhythm:   regular BP sitting:   124 / 82  (left arm) Cuff size:   large  Vitals Entered By: Army Fossa CMA (April 23, 2010 8:00 AM) CC: CPX, fasting Comments pharm- [facility] declines td  flu shot   History of Present Illness: Here for Medicare AWV:  1.   Risk factors based on Past M, S, F history: yes  2.   Physical Activities: active, yard work (heavy), golf  weekly , wood work  3.   Depression/mood: denies , no problems noted , occasionally. insomnia  4.   Hearing: moderate problems in noisy enviroments  5.   ADL's:  totally independent  6.   Fall Risk: no falls , low risk  7.   Home Safety: feels safe at home  8.   Height, weight, &visual acuity: see VS, poor vision (cataracts, retinopathy--f/u closely w/                 ophtalmology) 9.   Counseling: yes , see below  10.   Labs ordered based on risk factors: yes  11.           Referral Coordination: if needed  12.           Care Plan: see a/p  13.            Cognitive Assessment : motor skills, memory and cogniti0on seem appropiate   in addition, we discussed the following issues  Hypertension-- good medication compliance , ambulatory BPs wnl per patient  Hyperlipidemia: on diet only  Diabetes mellitus, occasionally checks ambulatory CBGs all within range ,  w/ (+) retinopathy , just saw ophtalmology  Chronic cough, still there  Preventive Screening-Counseling & Management  Caffeine-Diet-Exercise     Does Patient Exercise: yes     Type of exercise: walking     Times/week: 5  Current Medications (verified): 1)  Triamterene-Hctz 37.5-25 Mg  Caps (Triamterene-Hctz) .Marland Kitchen.. 1 By Mouth Once Daily- Cpx and Labs Due Now 2)  Freestyle Lite   Strp (Glucose Blood) .... Use As Directed Three Times Daily 3)  Freestyle Lancets   Misc (Lancets) .... Use As Directed Daily 4)   Aspirin 81 Mg Tbec (Aspirin) .Marland Kitchen.. 1 Once Daily 5)  Finasteride 5 Mg Tabs (Finasteride) .Marland Kitchen.. 1 By Mouth Once Daily 6)  Krill Oil 1000 Mg Caps (Krill Oil) .Marland Kitchen.. 1 Once Daily  Allergies: 1)  ! * Zetia 2)  ! Lipitor 3)  ! * Lovastatin  Past History:  Past Medical History: Reviewed history from 03/16/2010 and no changes required. Hypertension Hyperlipidemia: diet controlled , statins and zetia  intolerant  Diabetes mellitus, type II (+) retinopathy and mild glaucoma Transient ischemic attack, hx of (1999) hx of histoplasmosis Chronic cough, onset Jan 2011     - allergy profile January 07, 2010 >> neg     - Max GERD RX / off oils June 2 2011x 3 weeks only but no benefit     - Sinus Ct rec February 15, 2010 >> refused due to claustrophobia     - MCT neg for asthma o7/25/11      - Add 1st gen H1 February 15, 2010  >>  better   Past Surgical History: Reviewed history from 01/30/2008 and no changes required. Cspine surgery (plate)  Cholecystectomy  Social History: Married Retired 2 children Former smoker.  Quit in 1976.  Smoked 1 ppd x 18 yrs active, see HPI Does Patient Exercise:  yes  Review of Systems CV:  Denies chest pain or discomfort, palpitations, and swelling of feet. Resp:  Denies coughing up blood and wheezing. GI:  Denies bloody stools, nausea, and vomiting; no GERD symptoms . GU:  Denies hematuria, urinary frequency, and urinary hesitancy.  Physical Exam  General:  alert, well-developed, and well-nourished.   Neck:  no masses, no thyromegaly, and normal carotid upstroke.   Lungs:  normal respiratory effort, no intercostal retractions, no accessory muscle use, and normal breath sounds.   Heart:  normal rate, regular rhythm, no murmur, and no gallop.   Abdomen:  soft, non-tender, no distention, no masses, no guarding, and no rigidity.   Extremities:  no lower extremity edema Neurologic:  alert & oriented X3, strength normal in all extremities, and gait normal.   Psych:   Cognition and judgment appear intact. Alert and cooperative with normal attention span and concentration.  not anxious appearing and not depressed appearing.     Impression & Recommendations:  Problem # 1:  HEALTH SCREENING (ICD-V70.0)  Td--  ?? Pneumonia shot-- never  benefits of above shots discussed, not interested  flu shot-- today  Shingles immunization, information provided  colonoscopy-- last  11-2009, Dr. Randa Evens, negative, next in 5 years (per patient) PSA and DRE -- 09-2009, Dr. Aldean Ast, urology (per patient)  diet and exercise discussed    Orders: Medicare -1st Annual Wellness Visit [phone])  Problem # 2:  DIABETES MELLITUS, TYPE II (ICD-250.00) diet controlled Retinopathy closely follow up by ophthalmology His updated medication list for this problem includes:    Aspirin 81 Mg Tbec (Aspirin) .Marland Kitchen... 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 -- referred to specialist (03/30/2009) Reviewed HgBA1c results: 6.1 (11/23/2009)  6.3 (01/30/2009)  Orders: TLB-A1C / Hgb A1C (Glycohemoglobin) (83036-A1C) TLB-Microalbumin/Creat Ratio, Urine (82043-MALB) Specimen Handling (98119)  Problem # 3:  HYPERLIPIDEMIA (ICD-272.4)  due for labs, diet controlled, intolerant to most medicines   Labs Reviewed: SGOT: 28 (09/27/2007)   SGPT: 30 (09/27/2007)   HDL:47.3 (02/01/2008), 44.2 (09/27/2007)  LDL:DEL (02/01/2008), 70 (14/78/2956)  Chol:218 (02/01/2008), 125 (09/27/2007)  Trig:73 (02/01/2008), 55 (09/27/2007)  Orders: TLB-Lipid Panel (80061-LIPID)  Problem # 4:  HYPERTENSION (ICD-401.9)  EKG for baseline Good control Labs His updated medication list for this problem includes:    Triamterene-hctz 37.5-25 Mg Caps (Triamterene-hctz) .Marland Kitchen... 1 by mouth once daily- cpx and labs due now    BP today: 124/82 Prior BP: 130/72 (03/16/2010)  Labs Reviewed: K+: 4.9 (12/18/2009) Creat: : 1.3 (12/16/2009)    Chol: 218 (02/01/2008)   HDL: 47.3 (02/01/2008)   LDL: DEL (02/01/2008)   TG: 73 (02/01/2008)  Orders: Venipuncture (21308) TLB-BMP (Basic Metabolic Panel-BMET) (80048-METABOL) Specimen Handling (65784) EKG w/ Interpretation (93000)  Problem # 5:  asked to come back in 6 months  Problem # 6:  COUGH (ICD-786.2) persisting cough  Status post the above by pulmonary He was recommended to take Prilosec but for some reason he can't tolerate it. Trial with dexilant  one in the morning before breakfast. 15 day supply provided If cough persists, recommend to see the specialist again  Complete Medication List: 1)  Triamterene-hctz 37.5-25 Mg Caps (Triamterene-hctz) .Marland Kitchen.. 1 by mouth once daily- cpx and labs due now 2)  Freestyle Lite Strp (Glucose  blood) .... Use as directed three times daily 3)  Freestyle Lancets Misc (Lancets) .... Use as directed daily 4)  Aspirin 81 Mg Tbec (Aspirin) .Marland Kitchen.. 1 once daily 5)  Finasteride 5 Mg Tabs (Finasteride) .Marland Kitchen.. 1 by mouth once daily 6)  Krill Oil 1000 Mg Caps (Krill oil) .Marland Kitchen.. 1 once daily  Other Orders: Flu Vaccine 6yrs + MEDICARE PATIENTS (Z6109) Administration Flu vaccine - MCR (U0454)  Patient Instructions: 1)  Please schedule a follow-up appointment in 6 months .  Prescriptions: TRIAMTERENE-HCTZ 37.5-25 MG  CAPS (TRIAMTERENE-HCTZ) 1 by mouth once daily- CPX AND LABS DUE NOW  #90 Each x 2   Entered by:   Army Fossa CMA   Authorized by:   Nolon Rod. Paz MD   Signed by:   Nolon Rod. Paz MD on 04/24/2010   Method used:   Electronically to        [id number] (retail)       [address]       [place], [place]  [address]       Ph: [phone]       Fax: [phone]   RxID:   [id number]    Risk Factors:  Exercise:  yes    Times per week:  5    Type:  walking Flu Vaccine Consent Questions     Do you have a history of severe allergic reactions to this vaccine? no    Any prior history  of allergic reactions to egg and/or gelatin? no    Do you have a sensitivity to the preservative Thimersol? no    Do you have a past history of Guillan-Barre Syndrome? no    Do you currently have an acute febrile illness? no    Have you ever had a severe reaction to latex? no    Vaccine information given and explained to patient? yes    Are you currently pregnant? no    Lot Number:[id number]   Exp Date:02/05/2011   Site Given  Left Deltoid IM   .lbmedflu

## 2010-09-09 NOTE — Letter (Signed)
Summary: [facility]   Imported By: Lanelle Bal 04/19/2010 14:16:32  _____________________________________________________________________  External Attachment:    Type:   Image     Comment:   External Document

## 2010-09-09 NOTE — Procedures (Signed)
Summary: Colonoscopy--essentially negative, repeat 5 years  Colonoscopy / [facility]   Imported By: Lennie Odor 01/29/2010 14:50:01  _____________________________________________________________________  External Attachment:    Type:   Image     Comment:   External Document

## 2010-09-09 NOTE — Assessment & Plan Note (Signed)
Summary: Pulmonary/ ext summary final f/u ov   Copy to:  Dr. Drue Novel Primary Provider/Referring Provider:  Dr. Drue Novel  CC:  4 wk followup.  Pt states cough is much improed- still has occ dry cough that " comes and goes".  .  History of Present Illness: 17 yowm quit smoking 1976 no respiratory problems then developed year round sneezing on a daily basis x 2007 typically in afternoons.   January 07, 2010 cc abrupt onset cough with uri around the first of the 2011, present daily, seems worse sitting still after supper resolves sleeping and when outdoors and active.  cough is not  very productive.  Still sneezing.  no purulent secretions. imp uacs rec diet Dexilant 60 Take  one 30-60 min before first meal of the day  and pepcid 20 mg one at bedtime >  not effective at all.   February 15, 2010 4 wk followup.  Pt states that his cough is the same- no better or worse.   He notices cough more in the evenings.  Cough made worse after cutting his grass.  started back on Krill oil after 3 weeks but did not feel any better off it and on max acid rx.  no overt sinus symptoms or hb.  rec Acid reflux is a suspect here and needs to be eliminated  completely before considering additional studies or treatment options. To suppress this maximally, take prilosec otc before first meal and  pepcid 20 mg (otc) at bedtime plus diet measures as listed.  Add chlortrimeton 4 mg after supper,  at bedtime and as needed during the day for the tickle. Rec sinus ct , declined due to clostrophobia;  methacholine study neg for asthma 7/25  March 16, 2010 4 wk followup.  Pt states cough is much improved- still has occ dry cough that " comes and goes".   back on Krill oil not taking ppi or pepcid but H1st seem to help a lot and has found less need for it that when orginally started.  Pt denies any significant sore throat, dysphagia, itching, sneezing,  nasal congestion or excess secretions,  fever, chills, sweats, unintended wt loss, pleuritic or  exertional cp, hempoptysis, change in activity tolerance  orthopnea pnd or leg swelling.  Pt also denies any obvious fluctuation in symptoms with weather or environmental change or other alleviating or aggravating factors.       Current Medications (verified): 1)  Triamterene-Hctz 37.5-25 Mg  Caps (Triamterene-Hctz) .Marland Kitchen.. 1 By Mouth Once Daily- Cpx and Labs Due Now 2)  Freestyle Lite   Strp (Glucose Blood) .... Use As Directed Three Times Daily 3)  Freestyle Lancets   Misc (Lancets) .... Use As Directed Daily 4)  Aspirin 81 Mg Tbec (Aspirin) .Marland Kitchen.. 1 Once Daily 5)  Finasteride 5 Mg Tabs (Finasteride) .Marland Kitchen.. 1 By Mouth Once Daily 6)  Krill Oil 1000 Mg Caps (Krill Oil) .Marland Kitchen.. 1 Once Daily 7)  Chlorpheniramine Maleate 4 Mg Tabs (Chlorpheniramine Maleate) .Marland Kitchen.. 1 Every 6 Hours As Needed  Allergies (verified): 1)  ! * Zetia 2)  ! Lipitor 3)  ! * Lovastatin  Past History:  Past Medical History: Hypertension Hyperlipidemia: diet controlled , statins and zetia  intolerant  Diabetes mellitus, type II (+) retinopathy and mild glaucoma Transient ischemic attack, hx of (1999) hx of histoplasmosis Chronic cough, onset Jan 2011     - allergy profile January 07, 2010 >> neg     - Max GERD RX / off  oils June 2 2011x 3 weeks only but no benefit     - Sinus Ct rec February 15, 2010 >> refused due to claustrophobia     - MCT neg for asthma o7/25/11      - Add 1st gen H1 February 15, 2010  >>  better   Vital Signs:  Patient profile:   76 year old male Weight:      174 pounds O2 Sat:      98 % on Room air Temp:     97.8 degrees F oral Pulse rate:   68 / minute BP sitting:   130 / 72  (left arm)  Vitals Entered By: Vernie Murders (March 16, 2010 10:55 AM)  O2 Flow:  Room air  Physical Exam  Additional Exam:  amb wm nad with  no longer  throat clearling wt  175 January 07, 2010 > 174 February 15, 2010  > 174 March 16, 2010  HEENT: nl dentition, turbinates, and orophanx. Nl external ear canals without cough  reflex NECK :  without JVD/Nodes/TM/ nl carotid upstrokes bilaterally LUNGS: no acc muscle use, clear to A and P bilaterally without cough on insp or exp maneuvers CV:  RRR  no s3 or murmur or increase in P2, no edema  ABD:  soft and nontender with nl excursion in the supine position. No bruits or organomegaly, bowel sounds nl MS:  warm without deformities, calf tenderness, cyanosis or clubbing    Impression & Recommendations:  Problem # 1:  COUGH (ICD-786.2)  The most common causes of chronic cough in immunocompetent adults include: upper airway cough syndrome (UACS), previously referred to as postnasal drip syndrome,  caused by variety of rhinosinus conditions; (2) asthma; (3) GERD; (4) chronic bronchitis from cigarette smoking or other inhaled environmental irritants; (5) nonasthmatic eosinophilic bronchitis; and (6) bronchiectasis. These conditions, singly or in combination, have accounted for up to 94% of the causes of chronic cough in prospective studies.   He does not have asthma and did not have improvement with treatment for reflux but always difficult to know in upper airway cough syndromes because  Classic Upper airway cough syndrome, so named because it's frequently impossible to sort out how much is  CR/sinusitis with freq throat clearing (which can be related to primary GERD)   vs  causing  secondary extra esophageal GERD from wide swings in gastric pressure that occur with throat clearing, promoting self use of mint and menthol lozenges that reduce the lower esophageal sphincter tone and exacerbate the problem further These are the same pts who not infrequently have failed to tolerate ace inhibitors,  dry powder inhalers or biphosphonates or report having reflux symptoms that don't respond to standard doses of PPI.  For now continue 1st gen H1 per guidelines, f/u here as needed.   Orders: Est. Patient Level IV (24401)  Medications Added to Medication List This Visit: 1)   Chlorpheniramine Maleate 4 Mg Tabs (Chlorpheniramine maleate) .Marland Kitchen.. 1 every 6 hours as needed  Clinical Reports Reviewed:  CXR:  11/24/2009: CXR Results:  Comparison: Chest 06/17/2005.   Findings: Calcified granuloma is again seen in the right upper lobe. Calcified right hilar lymph nodes again noted.  Lungs otherwise clear.  Heart size normal.  No effusion.   IMPRESSION: No acute disease.  Stable compared to prior exam.  06/17/2005: CXR Results:   IMPRESSION:   No active lung disease.  Calcified granuloma and hilar nodes   secondary to prior granulomatous disease.  Read By:  Juline Patch,  M.D.  07/12/2000: CXR Results:   IMPRESSION:  NO RADIOGRAPHIC EVIDENCE OF AN ACUTE   CARDIOPULMONARY PROCESS.   TRANSCRIBED DATE:  MJW  REM                                           Read EA:VWUJ L.   Judyann Munson, M.D.   Patient Instructions: 1)  GERD (REFLUX)  is a common cause of respiratory symptoms. It commonly presents without heartburn and can be treated with medication, but also with lifestyle changes including avoidance of late meals, excessive alcohol, smoking cessation, and avoid fatty foods, chocolate, peppermint, colas, red wine, and acidic juices such as orange juice. NO MINT OR MENTHOL PRODUCTS SO NO COUGH DROPS  2)  USE SUGARLESS CANDY INSTEAD (jolley ranchers)  3)  Minimize  OIL BASED VITAMINS   4)  continue chlortrimeton as needed, return if needed

## 2010-09-09 NOTE — Assessment & Plan Note (Signed)
Summary: Pulmonary/ f/u ov - resume acid rx  add  1st gen H1   Copy to:  Dr. Drue Novel Primary Provider/Referring Provider:  Dr. Drue Novel  CC:  4 wk followup.  Pt states that his cough is the same- no better or worse.   He notices cough more in the evenings.  Cough made worse after cutting his grass.Marland Kitchen  History of Present Illness: 64 yowm quit smoking 1976 no respiratory problems then developed year round sneezing on a daily basis x 2007 typically in afternoons.   January 07, 2010 cc abrupt onset cough with uri around the first of the 2011, present daily, seems worse sitting still after supper resolves sleeping and when outdoors and active.  cough is not  very productive.  Still sneezing.  no purulent secretions. imp uacs rec diet Dexilant 60 Take  one 30-60 min before first meal of the day  and pepcid 20 mg one at bedtime >  not effective at all.   February 15, 2010 4 wk followup.  Pt states that his cough is the same- no better or worse.   He notices cough more in the evenings.  Cough made worse after cutting his grass.  started back on Krill oil after 3 weeks but did not feel any better off it and on max acid rx.  no overt sinus symptoms or hb.  Pt denies any significant sore throat, dysphagia, itching, sneezing,  nasal congestion or excess secretions,  fever, chills, sweats, unintended wt loss, pleuritic or exertional cp, hempoptysis, change in activity tolerance  orthopnea pnd or leg swelling     Current Medications (verified): 1)  Triamterene-Hctz 37.5-25 Mg  Caps (Triamterene-Hctz) .Marland Kitchen.. 1 By Mouth Once Daily- Cpx and Labs Due Now 2)  Freestyle Lite   Strp (Glucose Blood) .... Use As Directed Three Times Daily 3)  Freestyle Lancets   Misc (Lancets) .... Use As Directed Daily 4)  Aspirin 81 Mg Tbec (Aspirin) .Marland Kitchen.. 1 Once Daily 5)  Finasteride 5 Mg Tabs (Finasteride) .Marland Kitchen.. 1 By Mouth Once Daily 6)  Dexilant 60 Mg Cpdr (Dexlansoprazole) .... Take  One 30-60 Min Before First Meal of The Day 7)  Krill Oil 1000  Mg Caps (Krill Oil) .Marland Kitchen.. 1 Once Daily  Allergies (verified): 1)  ! * Zetia 2)  ! Lipitor 3)  ! * Lovastatin  Past History:  Past Medical History: Hypertension Hyperlipidemia: diet controlled , statins and zetia  intolerant  Diabetes mellitus, type II (+) retinopathy and mild glaucoma Transient ischemic attack, hx of (1999) hx of histoplasmosis Chronic cough, onset Jan 2011     - allergy profile January 07, 2010 >> neg     - Max GERD RX / off oils June 2 2011x 3 weeks only but no benefit     - Sinus Ct rec February 15, 2010 >> refused due to claustrophobia     - Add 1st gen H1 February 15, 2010   Vital Signs:  Patient profile:   76 year old male Weight:      174 pounds O2 Sat:      99 % on Room air Temp:     98.2 degrees F oral Pulse rate:   88 / minute BP sitting:   138 / 78  (left arm)  Vitals Entered By: Vernie Murders (February 15, 2010 9:22 AM)  O2 Flow:  Room air  Physical Exam  Additional Exam:  amb wm nad with occ throat clearling wt  175 January 07, 2010 > 174 February 15, 2010  HEENT: nl dentition, turbinates, and orophanx. Nl external ear canals without cough reflex NECK :  without JVD/Nodes/TM/ nl carotid upstrokes bilaterally LUNGS: no acc muscle use, clear to A and P bilaterally without cough on insp or exp maneuvers CV:  RRR  no s3 or murmur or increase in P2, no edema  ABD:  soft and nontender with nl excursion in the supine position. No bruits or organomegaly, bowel sounds nl MS:  warm without deformities, calf tenderness, cyanosis or clubbing    Impression & Recommendations:  Problem # 1:  COUGH (ICD-786.2) I had an extended discussion with the patient today lasting 15 to 20 minutes of a 25 minute visit on the following issues:   The standardized cough guidelines recently published in Chest are a 14 step process, not a single office visit,  and are intended  to address this problem logically,  with an alogrithm dependent on response to each progressive step  to  determine a specific diagnosis with  minimal addtional testing needed. Therefore if compliance is an issue this empiric standardized approach simply won't work.  So far no response to max gerd rx albeit only one half of the normal duration so resume acid rx x 2 weeks then MCT and in meantime try 1st Gen H1 per cough guidelines.  Medications Added to Medication List This Visit: 1)  Krill Oil 1000 Mg Caps (Krill oil) .Marland Kitchen.. 1 once daily  Other Orders: Misc. Referral (Misc. Ref) Misc. Referral (Misc. Ref)  Patient Instructions: 1)  Acid reflux is a suspect here and needs to be eliminated  completely before considering additional studies or treatment options. To suppress this maximally, take prilosec otc before first meal and  pepcid 20 mg (otc) at bedtime plus diet measures as listed.  2)  Add chlortrimeton 4 mg after supper,  at bedtime and as needed during the day for the tickle. 3)  See Patient Care Coordinator before leaving for sinus ct now and methacholine challenge in two weeks which you can call and cancel   Appended Document: Orders Update     Clinical Lists Changes  Orders: Added new Service order of Est. Patient Level IV (81191) - Signed

## 2010-09-09 NOTE — Progress Notes (Signed)
Summary: refill  Phone Note Refill Request Message from:  Fax from Pharmacy on April 28, 2010 8:47 AM  Refills Requested: Medication #1:  TRIAMTERENE-HCTZ 37.5-25 MG  CAPS 1 by mouth once daily- CPX AND LABS DUE NOW [facility] [address] - fax [phone]  Initial call taken by: Okey Regal [place],  April 28, 2010 8:47 AM  Follow-up for Phone Call        pharm did not receive. Army Fossa CMA  April 28, 2010 8:52 AM     Prescriptions: TRIAMTERENE-HCTZ 37.5-25 MG  CAPS (TRIAMTERENE-HCTZ) 1 by mouth once daily- CPX AND LABS DUE NOW  #90 Each x 2   Entered by:   Army Fossa CMA   Authorized by:   Nolon Rod. Paz MD   Signed by:   Army Fossa CMA on 04/28/2010   Method used:   Electronically to        [id number]* (retail)       [address]       [place], [place]  [address]       Ph: [phone]       Fax: [phone]   RxID:   [id number]

## 2010-11-27 ENCOUNTER — Encounter: Payer: Self-pay | Admitting: Internal Medicine

## 2010-11-29 ENCOUNTER — Ambulatory Visit (INDEPENDENT_AMBULATORY_CARE_PROVIDER_SITE_OTHER): Payer: Medicare Other | Admitting: Internal Medicine

## 2010-11-29 ENCOUNTER — Encounter: Payer: Self-pay | Admitting: Internal Medicine

## 2010-11-29 DIAGNOSIS — I10 Essential (primary) hypertension: Secondary | ICD-10-CM

## 2010-11-29 DIAGNOSIS — E119 Type 2 diabetes mellitus without complications: Secondary | ICD-10-CM

## 2010-11-29 DIAGNOSIS — B351 Tinea unguium: Secondary | ICD-10-CM

## 2010-11-29 LAB — BASIC METABOLIC PANEL
CO2: 31 mEq/L (ref 19–32)
Calcium: 9.9 mg/dL (ref 8.4–10.5)
GFR: 52.67 mL/min — ABNORMAL LOW (ref >60.00)
Glucose, Bld: 105 mg/dL — ABNORMAL HIGH (ref 70–99)
Potassium: 4.6 mEq/L (ref 3.5–5.1)
Sodium: 137 mEq/L (ref 135–145)

## 2010-11-29 LAB — HEMOGLOBIN A1C: Hgb A1c MFr Bld: 6.2 % (ref 4.6–6.5)

## 2010-11-29 MED ORDER — CICLOPIROX 8 % EX SOLN: Freq: Every day | CUTANEOUS | Status: AC

## 2010-11-29 NOTE — Assessment & Plan Note (Signed)
Findings consistent with onychomycoses, options discussed. No action Oral medication, cost and liver side effect discussed Topical medication. Elected topical medication, see prescription

## 2010-11-29 NOTE — Assessment & Plan Note (Signed)
Labs, See instructions

## 2010-11-29 NOTE — Patient Instructions (Addendum)
Schedule a mole removal at your earliest convenience  check your BP 3 times a week, call if > 140/85 consistently

## 2010-11-29 NOTE — Progress Notes (Signed)
  Subjective:    Patient ID: Charles [name], male    DOB: Jul 15, 1935, 76 y.o.   MRN: [id number]  HPI  He had a lesion on the left arm (triceps area) that was stable until a month ago when he noticed a change in the color and increasing in size. No bleeding. He complains of pain and a medial right toe. He has onychomycoses. Wonders if that is related.  Past Medical History  Diagnosis Date  . Hypertension   . Hyperlipidemia     diet controlled, statins and zetia intolerant  . Diabetes mellitus   . Retinopathy     mild glaucomea  . Transient ischemic attack 1999  . Histoplasmosis   . Chronic cough 08/2009    -allergy profile- june 2,2011- neg. -Max GERD rx/ off oils January 07 2010 x 3 weeks only but no benefit. - Sinus CT rec July 11,2011- refused due to claustrophobia -MCT neg for asthma 03/01/10 -Add 1st Gen H1 July 11,2011- better   Past Surgical History  Procedure Date  . Cervical spine surgery     plate  . Cholecystectomy     Review of Systems He has diabetes, his blood sugars have been in the lower side lately, has been as low as 50. He is on diet only, his lifestyle has not changed. He has no symptoms related to hypoglycemia.     Objective:   Physical Exam  Constitutional: He is oriented to person, place, and time. He appears well-developed and well-nourished.  Neurological: He is alert and oriented to person, place, and time.  Skin:       Thick nails R great and 3th toe. The skin between the toes is normal. Left arm, tricipital area. He has a slightly elevated, hyperchromic, bicolor , oval 3/4 cm lesion           Assessment & Plan:  Skin lesion, left arm. It needs to be removed due to recent changes. Patient will schedule at his earliest convenience

## 2010-11-29 NOTE — Assessment & Plan Note (Addendum)
CBGs better w/o any changes in life style Labs

## 2010-12-01 ENCOUNTER — Telehealth: Payer: Self-pay | Admitting: *Deleted

## 2010-12-01 NOTE — Telephone Encounter (Signed)
Message copied by Army Fossa on Wed Dec 01, 2010  9:34 AM ------      Message from: Charles [name]      Created: Tue Nov 30, 2010  9:25 PM       Advise patient:      DM well controlled       BMP ok       Plan is the same

## 2010-12-01 NOTE — Telephone Encounter (Signed)
Message left for patient to return my call.  

## 2010-12-02 NOTE — Telephone Encounter (Signed)
Spoke w/ pt aware of labs.  

## 2010-12-02 NOTE — Telephone Encounter (Signed)
Message left for patient to return my call.  

## 2010-12-10 ENCOUNTER — Encounter: Payer: Self-pay | Admitting: Internal Medicine

## 2010-12-10 ENCOUNTER — Ambulatory Visit (INDEPENDENT_AMBULATORY_CARE_PROVIDER_SITE_OTHER): Payer: Medicare Other | Admitting: Internal Medicine

## 2010-12-10 VITALS — BP 134/80 | HR 72 | Wt 175.8 lb

## 2010-12-10 DIAGNOSIS — D229 Melanocytic nevi, unspecified: Secondary | ICD-10-CM

## 2010-12-10 DIAGNOSIS — D239 Other benign neoplasm of skin, unspecified: Secondary | ICD-10-CM

## 2010-12-10 NOTE — Progress Notes (Signed)
  Subjective:    Patient ID: Charles [name], male    DOB: Jul 08, 1935, 76 y.o.   MRN: [id number]  HPI mole removal  Review of Systems     Objective:   Physical Exam At the left arm, triceps area, has a 1 cm oval, bicolor lesion       Assessment & Plan:  Procedure note: In a sterile fashion and under local anesthesia with 1 cc of lidocaine 2% without, we did an elliptical incision, 1.5 cm in size, lesion was sent to pathology, 3 stitches applied. Patient will keep the area clean and dry. Come back in one week. Will call if any redness, swelling or discharge.

## 2010-12-17 ENCOUNTER — Encounter: Payer: Self-pay | Admitting: Internal Medicine

## 2010-12-17 ENCOUNTER — Ambulatory Visit (INDEPENDENT_AMBULATORY_CARE_PROVIDER_SITE_OTHER): Payer: Medicare Other | Admitting: Internal Medicine

## 2010-12-17 VITALS — BP 126/82 | HR 70 | Wt 176.2 lb

## 2010-12-17 DIAGNOSIS — Z5189 Encounter for other specified aftercare: Secondary | ICD-10-CM

## 2010-12-17 NOTE — Progress Notes (Signed)
  Subjective:    Patient ID: Charles [name], male    DOB: [birth year], 76 y.o.   MRN: [id number]  HPI Here for suture removal   Review of Systems No reported problems    Objective:   Physical Exam Wound healing well, no discharge or evidence of infection       Assessment & Plan:  Stitches removed without problems. Pathology benign,  discussed with patient

## 2010-12-21 NOTE — Assessment & Plan Note (Signed)
[facility]                               LIPID CLINIC NOTE   HAMMAD, FINKLER                      MRN:          [id number]  DATE:06/25/2007                            DOB:          [birth year]    [name] is a pleasant gentleman seen in the Lipid Clinic today for  initial evaluation and medication titration associated with his  dyslipidemia.  Mr. Kulikowski is seen with his wife today.  He states that  he has recently learned of his new diagnosis of diabetes and that this  is his focus for right now.  He states that he is scheduled to have 6  hours of diabetes education on a diabetic diet and that he feels  strongly that he will focus on this endeavor.  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  Mr.  Homann' diet includes that he likes sweets but he does not eat  regularly.  He is motivated by his new diagnosis of diabetes in his  words and states that he is looking forward to his diabetes classes so  that he can work to reduce his long term sequela of diabetes.  When  asked about his biggest concern he relates loss of limb or eyesight as  those things that scare him most about diabetes.   PAST FAMILY HISTORY:  A mother who passed away of liver cancer, father  with history of colon cancer and stroke, several brothers, one of whom  has cholesterol issues but has been able to tolerate Zocor.   PAST MEDICAL HISTORY:  1. Diabetes which is newly diagnosed.  2. Hypertension.  3. Dyslipidemia.   CURRENT MEDICATIONS:  1. Maxzide 37.5/25 mg daily.  2. Finasteride 5 mg daily.  3. Ramelteon 8 mg daily at bedtime.   ALLERGIES AND INTOLERANCES:  INCLUDE LIPITOR WHICH CAUSED FATIGUE,  MUSCLE WEAKNESS AND  FOR HIS LEGS TO GO OUT FROM UNDER HIM; ZETIA YIELDED  VERTIGO WHICH HE ADAMANTLY STATES HE WILL NOT RESUME AS HE WORKS WITH  WOOD CUTTING MATERIALS REGULARLY AND VERTIGO COULD END UP IN HIM LOSING  A LIMB.   FAMILY HISTORY:  As noted above.   SOCIAL HISTORY:  Patient does not smoke, nor has he ever smoked.  He  does not drink alcohol on a regular basis.   PHYSICAL EXAMINATION:  Weight today is 189 pounds, blood pressure is  152/82 in the left arm, 160/90 in the right, his heart rate is 84, his  respirations are 16 and comfortable.   Labs obtained on June 14, 2007 reveal total cholesterol 207,  triglyceride 134, HDL 39.6, LDL 149.4.  LFTs are within normal limits.   DISCUSSION:  The patient has had elevated CKs of up to 300 in the Spring  associated possibly with his Lipitor.  The patient was  evaluated by Dr.  Phylliss Bob in Rheumatology who felt that this was an unspecific finding and  that he is an extremely active individual.  The patient verbalizes that  he is extremely motivated to improve his diet and exercise and improve  his overall health conditioning regimen.   ASSESSMENT:  The patient has hyperlipidemia, new diagnosis of diabetes  thus his LDL goal is somewhere less than 100.  After reviewing the  patient's diet at length it is very clear that he has significantly  reduced his LDL over the past several years with diet and exercise,  however it is not very realistic for the patient to believe or for me to  encourage the patient that with diet alone he can cut his LDL in half  again.  The patient appears willing and verbalizes a willingness to  reconsider medications which I am heartened by.  He however wants to  make absolutely sure that he has least likelihood of muscle ache and  pain and I have ensured him that we will work with him to find a therapy  that is doable for him in the longterm.   PLAN:  I will ask the patient in the next 2 weeks to obtain a CK so that  we can  assess after a normal week's activity what his CK is with normal  activity.  I anticipate that it is higher than the upper limit of  reference range but I will be very interested to see that.  Patient will  have that drawn and I will follow up with him via telephone when the  result is received.  I anticipate since he has had problems with Lipitor  we might try Crestor 5 mg every other day to see if that is helpful and  then we will proceed accordingly.  Patient's questions have been  answered, he agrees to the plan for followup and seems to understand the  rationale for use of medications in the described fashion.   Thank you for the opportunity to see this pleasant patient.      Shelby Dubin, PharmD, BCPS, CPP  Electronically Signed      Rollene Rotunda, MD, [facility]  Electronically Signed   MP/MedQ  DD: 07/03/2007  DT: 07/03/2007  Job #: [id number]   cc:   Leanne Chang, M.D.

## 2010-12-21 NOTE — Assessment & Plan Note (Signed)
[facility]                               LIPID CLINIC NOTE   Charles, [name]                      MRN:          [id number]  DATE:07/18/2007                            DOB:          [birth year]    I spoke with [name] last evening at 6 o'clock in the evening on  July 18, 2007 to discuss  his CK total results.  We were able to  agree that simvastatin 40 mg daily was an appropriate therapy with his  slightly elevated CK at 215 at baseline.  It is of note that [name]'  brother was able to tolerate on simvastatin without issue after failing  Lipitor.  I have called at the patient's request this morning, July 19, 2007 at 9:15 to [facility] on [address] [employer] at [employer] on  simvastatin 40 one tablet daily with 3 refills, a month's supply under  my name and that of J. Hochrein, M.D.  The patient and I will speak over  the next several weeks as we work towards 1 month followup of liver  function and lipids.  The patient will call with questions or problems  in the meantime.  I have called the patient this morning after speaking  with the pharmacist to let him know that this prescription has been  called.  I left a voice mail for him there.      Shelby Dubin, PharmD, BCPS, CPP  Electronically Signed      Rollene Rotunda, MD, [facility]  Electronically Signed   MP/MedQ  DD: 07/19/2007  DT: 07/19/2007  Job #: [id number]

## 2010-12-21 NOTE — Assessment & Plan Note (Signed)
[facility]                                 ON-CALL NOTE   TEGH, FRANEK                      MRN:          [id number]  DATE:07/19/2007                            DOB:          [birth year]    TIME OF DISCUSSION:  1:45pm.   [name] is a patient in the lipid clinic. He called me back today  after I called his drug store with his Simvastatin 40 prescription and  asked that be changed to Lovastatin 40 mg daily. He says that in further  conversation with his brother, his brother is actually tolerating  Lovastatin daily instead of Simvastatin daily. I told him that I would  be happy to make that change, that it is a less potent statin, and is  one that we will have to monitor very closely with the others. He agrees  to call with muscle ache, pain, weakness, fatigue, or other problems. He  has been counseled to take this in the evening with a meal to improve  absorption and potency. He agrees to do this. At his request, I will  call this now to [facility] on [address] and ask the pharmacist to  remove the refill and delete the prescriptions for the Simvastatin.   TIME SPENT:  10 minutes.      Shelby Dubin, PharmD, BCPS, CPP  Electronically Signed      Rollene Rotunda, MD, [facility]  Electronically Signed   MP/MedQ  DD: 07/19/2007  DT: 07/20/2007  Job #: [id number]   cc:   Leanne Chang, M.D.

## 2010-12-24 NOTE — Op Note (Signed)
NAME:  MARLAN, STEWARD               ACCOUNT NO.:  [id number]   MEDICAL RECORD NO.:  [id number]          PATIENT TYPE:  AMB   LOCATION:  ENDO                         FACILITY:  [facility]   PHYSICIAN:  James L. Malon Kindle., M.D.DATE OF BIRTH:  28-Feb-1935   DATE OF PROCEDURE:  01/25/2005  DATE OF DISCHARGE:                                 OPERATIVE REPORT   PROCEDURE:  Colonoscopy.   MEDICATIONS:  Fentanyl 75 mcg, Versed 7.5 mg IV.   SCOPE:  Pediatric adjustable colonoscope.   INDICATIONS FOR PROCEDURE:  Follow-up for previous large polyp.   DESCRIPTION OF PROCEDURE:  The procedure explained to the patient and  consent obtained.  Left lateral decubitus position.  Digital examination  performed and Olympus pediatric scope inserted and advanced.  Prep  excellent.  Cecum reached.  Ileocecal valve and appendiceal orifice seen.  Scope withdrawn and mucosa carefully examined.  No polyps throughout the  entire colon.  No diverticular disease.  No masses, etc.  Rectum free of  polyps.  Large internal hemorrhoids seen in rectum.   ASSESSMENT:  1.  History of polyps with negative colonoscopy at this time.  V12.72  2.  Internal hemorrhoids.  455.0.   PLAN:  Will give a hemorrhoid instruction sheet and will recommend yearly  hemoccults.  Repeat procedure in 5 years.       JLE/MEDQ  D:  01/25/2005  T:  01/25/2005  Job:  [id number]   cc:   Criss Alvine, M.D.

## 2010-12-24 NOTE — Procedures (Signed)
[place]  Patient:    Charles [name], Charles [name] Visit Number: [id number] MRN: [id number]          Service Type: END Location: ENDO Attending Physician:  Orland Mustard Dictated by:   Llana Aliment. Randa Evens, M.D. Proc. Date: 11/08/01 Admit Date:  11/08/2001 Discharge Date: 11/08/2001   CC:         Redmond Baseman, M.D.   Procedure Report  PROCEDURE PERFORMED:  Colonoscopy.  ENDOSCOPIST:  Llana Aliment. Randa Evens, M.D.  MEDICATIONS USED:  Fentanyl 100 mcg, Versed 10 mg IV.  INSTRUMENT:  Adult Olympus video colonoscope.  INDICATIONS:  The patient had a very large polyp requiring multiple treatments with argon coagulator, the last one a year ago with no residual polyp.  This is done as a one year follow up.  DESCRIPTION OF PROCEDURE:  The procedure had been explained to the patient and consent obtained.  With the patient in the left lateral decubitus position, the Olympus adult video colonoscope was inserted and advanced under direct visualization.  The prep was excellent and we were able to advance to the cecum without difficulty.  The ileocecal valve and appendiceal orifice were seen.  The scope was withdrawn.  The cecum, ascending colon, hepatic flexure, transverse colon, splenic flexure, descending and sigmoid colon were seen well upon withdrawal.  The rectum was also seen well.  No polyps were seen.  No obvious problem.  ASSESSMENT:  No evidence of further polyps.  PLAN:  Will recommend repeating in three years. Dictated by:   Llana Aliment. Randa Evens, M.D. Attending Physician:  Orland Mustard DD:  11/08/01 TD:  11/08/01 Job: [id number]

## 2010-12-24 NOTE — Procedures (Signed)
[facility]  Patient:    Charles [name], Charles [name]                        MRN: [id number] Proc. Date: 09/19/00 Adm. Date:  [id number] Attending:  Orland Mustard CC:         Willis Modena. Dreiling, M.D.   Procedure Report  PROCEDURE:  Colonoscopy and polypectomy.  MEDICATIONS: 1. Fentanyl 75 mcg. 2. Versed 7 mg. 3. Phenergan 25 mg IV.  INDICATIONS:  The patient with previous rectal polyp.  This was a massive polyp requiring multiple treatments with Argon plasma coagulator.  The last was one year ago and no residual polyp was left.  This was done as a follow-up for one year.  SCOPE:  Adult Olympus video colonoscope.  DESCRIPTION OF PROCEDURE:  The procedure had been explained to the patient and consent was obtained.  With the patient in the left lateral decubitus position the adult Olympus video colonoscope was inserted and advanced under direct visualization.  The prep was quite good.  We were able to advance to the cecum without great deal of difficulty.  The cecum, ascending colon, hepatic flexure, transverse colon, splenic flexure, descending and sigmoid colon were seen well upon removal.  No polyps were seen until we got to the rectum. At this point, the massive scar seen on previous polyps was seen.  There was no sign of residual polyp except right at the corner, there was 0.5 cm sessile polyp which was removed partially with the snare and we went back and cauterized with the hot biopsy forceps.  No other abnormalities were seen in the rectum.  ASSESSMENT:  Massive rectal polyp essentially gone other than small area of 0.5 cm polyp which could be residual or a new polyp.  This was cauterized.  PLAN:  Routine post polypectomy instructions.  Recommend repeating in one year. DD:  09/19/00 TD:  09/19/00 Job: [id number]

## 2011-05-09 ENCOUNTER — Other Ambulatory Visit: Payer: Self-pay | Admitting: Internal Medicine

## 2011-05-09 NOTE — Telephone Encounter (Signed)
Rx Done . 

## 2011-07-21 ENCOUNTER — Encounter: Payer: Self-pay | Admitting: Internal Medicine

## 2011-07-25 ENCOUNTER — Ambulatory Visit (INDEPENDENT_AMBULATORY_CARE_PROVIDER_SITE_OTHER): Payer: Medicare Other | Admitting: Internal Medicine

## 2011-07-25 VITALS — BP 150/82 | HR 77 | Temp 97.4°F | Ht 71.0 in | Wt 181.0 lb

## 2011-07-25 DIAGNOSIS — I10 Essential (primary) hypertension: Secondary | ICD-10-CM

## 2011-07-25 DIAGNOSIS — E785 Hyperlipidemia, unspecified: Secondary | ICD-10-CM

## 2011-07-25 DIAGNOSIS — B351 Tinea unguium: Secondary | ICD-10-CM

## 2011-07-25 DIAGNOSIS — E119 Type 2 diabetes mellitus without complications: Secondary | ICD-10-CM

## 2011-07-25 LAB — CBC WITH DIFFERENTIAL/PLATELET
Basophils Absolute: 0 10*3/uL (ref 0.0–0.1)
Basophils Relative: 0.4 % (ref 0.0–3.0)
Eosinophils Absolute: 0.1 10*3/uL (ref 0.0–0.7)
Eosinophils Relative: 1.9 % (ref 0.0–5.0)
HCT: 44.2 % (ref 39.0–52.0)
Hemoglobin: 15 g/dL (ref 13.0–17.0)
Lymphocytes Relative: 24.6 % (ref 12.0–46.0)
Lymphs Abs: 1.9 10*3/uL (ref 0.7–4.0)
MCHC: 34 g/dL (ref 30.0–36.0)
MCV: 93.3 fl (ref 78.0–100.0)
Monocytes Absolute: 0.5 10*3/uL (ref 0.1–1.0)
Monocytes Relative: 6.1 % (ref 3.0–12.0)
Neutro Abs: 5.1 10*3/uL (ref 1.4–7.7)
Neutrophils Relative %: 67 % (ref 43.0–77.0)
Platelets: 211 10*3/uL (ref 150.0–400.0)
RBC: 4.73 Mil/uL (ref 4.22–5.81)
RDW: 13.6 % (ref 11.5–14.6)
WBC: 7.6 10*3/uL (ref 4.5–10.5)

## 2011-07-25 LAB — MICROALBUMIN / CREATININE URINE RATIO: Microalb Creat Ratio: 1.2 mg/g (ref 0.0–30.0)

## 2011-07-25 LAB — BASIC METABOLIC PANEL
BUN: 27 mg/dL — ABNORMAL HIGH (ref 6–23)
CO2: 29 mEq/L (ref 19–32)
Chloride: 104 mEq/L (ref 96–112)
Creatinine, Ser: 1.3 mg/dL (ref 0.4–1.5)
Potassium: 3.9 mEq/L (ref 3.5–5.1)

## 2011-07-25 LAB — HEMOGLOBIN A1C: Hgb A1c MFr Bld: 6.1 % (ref 4.6–6.5)

## 2011-07-25 LAB — LIPID PANEL: Total CHOL/HDL Ratio: 4

## 2011-07-25 LAB — LDL CHOLESTEROL, DIRECT: Direct LDL: 151.6 mg/dL

## 2011-07-25 MED ORDER — CICLOPIROX 8 % EX SOLN: Freq: Every day | CUTANEOUS | Status: DC

## 2011-07-25 NOTE — Assessment & Plan Note (Signed)
On diet only, CBGs in the 90s Labs New glucometer provided Feet exam : no neuropathy

## 2011-07-25 NOTE — Assessment & Plan Note (Signed)
Started treatment ~4-12, topical, no major change so far Plan: complete 1 year therapy, if further treatment desired we could Cx the nail and use oral med vs derm referral

## 2011-07-25 NOTE — Assessment & Plan Note (Signed)
Due for labs

## 2011-07-25 NOTE — Patient Instructions (Signed)
Check the  blood pressure 2 or 3 times a week, be sure it is less than 140/85. If it is consistently higher, let me know before the next visit, will adjust your meds  Next visit : 6 months, physical exam

## 2011-07-25 NOTE — Progress Notes (Signed)
  Subjective:    Patient ID: Charles [name], male    DOB: May 04, 1935, 76 y.o.   MRN: [id number]  HPI Routine office visit Diabetes--on diet only, but sugars usually 120s, lately has been in the 90s without changes in his diet or exercise. Wonders if he needs a new glucometer. Hypertension--lately, his BP has been elevated in the 150s. We compared his monitor with ours, the one he has reads 10 mm higher than ours. Back pain--recently saw his neurosurgeon, and they're doing a trial of Motrin. Patient is aware of GI side effects. NSAIDs may  be affecting his BP, patient aware. Onychomycoses--good compliance with Penlac  Past Medical History: Hypertension Hyperlipidemia: diet controlled , statins and zetia  intolerant  Diabetes mellitus, type II (+) retinopathy, retinal bleed ~ 2010 and mild glaucoma Transient ischemic attack, hx of (1999) hx of histoplasmosis Chronic cough, onset Jan 2011     - allergy profile January 07, 2010 >> neg     - Max GERD RX / off oils June 2 2011x 3 weeks only but no benefit     - Sinus Ct rec February 15, 2010 >> refused due to claustrophobia     - MCT neg for asthma o7/25/11      - Add 1st gen H1 February 15, 2010  >>  better   Past Surgical History: Cspine surgery (plate) Cholecystectomy  Social History: Married Retired 2 children Former smoker.  Quit in 1976.  Smoked 1 ppd x 18 yrs active, see HPI Does Patient Exercise:  yes  F H: Sister had a PE after a car trip  Review of Systems No chest pain or shortness or breath No nausea, vomiting, diarrhea    Objective:   Physical Exam  Constitutional: He is oriented to person, place, and time. He appears well-developed and well-nourished.  Cardiovascular: Normal rate, regular rhythm and normal heart sounds.   No murmur heard. Pulmonary/Chest: Effort normal and breath sounds normal. No respiratory distress. He has no wheezes. He has no rales.  Musculoskeletal:       DIABETIC FEET EXAM: No lower extremity  edema Normal pedal pulses bilaterally Skin normal Thick-yellow nails R 1 and 3 toes Pinprick examination of the feet normal.   Neurological: He is alert and oriented to person, place, and time.  Psychiatric: He has a normal mood and affect. His behavior is normal. Judgment and thought content normal.      Assessment & Plan:

## 2011-07-25 NOTE — Assessment & Plan Note (Addendum)
At goal?  BP maybe temporarily elevated because he is taking Motrin for back pain as prescribed by neurosurgery. I reminded patient about GI side effects. Plan: no change for now, see instructions, ARBs?

## 2011-11-25 ENCOUNTER — Other Ambulatory Visit: Payer: Self-pay | Admitting: Internal Medicine

## 2011-11-25 NOTE — Telephone Encounter (Signed)
Refill done.  

## 2012-01-06 ENCOUNTER — Other Ambulatory Visit: Payer: Self-pay | Admitting: Internal Medicine

## 2012-01-06 NOTE — Telephone Encounter (Signed)
Refill done.  

## 2012-01-13 ENCOUNTER — Encounter: Payer: Self-pay | Admitting: Internal Medicine

## 2012-01-13 ENCOUNTER — Ambulatory Visit (INDEPENDENT_AMBULATORY_CARE_PROVIDER_SITE_OTHER): Payer: Medicare Other | Admitting: Internal Medicine

## 2012-01-13 VITALS — BP 126/80 | HR 67 | Temp 97.7°F | Ht 71.0 in | Wt 179.6 lb

## 2012-01-13 DIAGNOSIS — E785 Hyperlipidemia, unspecified: Secondary | ICD-10-CM

## 2012-01-13 DIAGNOSIS — R5383 Other fatigue: Secondary | ICD-10-CM

## 2012-01-13 DIAGNOSIS — B351 Tinea unguium: Secondary | ICD-10-CM

## 2012-01-13 DIAGNOSIS — E119 Type 2 diabetes mellitus without complications: Secondary | ICD-10-CM

## 2012-01-13 DIAGNOSIS — R5381 Other malaise: Secondary | ICD-10-CM

## 2012-01-13 DIAGNOSIS — I10 Essential (primary) hypertension: Secondary | ICD-10-CM

## 2012-01-13 LAB — HEMOGLOBIN A1C: Hgb A1c MFr Bld: 6.3 % (ref 4.6–6.5)

## 2012-01-13 LAB — BASIC METABOLIC PANEL
Chloride: 104 mEq/L (ref 96–112)
Creatinine, Ser: 1.3 mg/dL (ref 0.4–1.5)
Potassium: 4.2 mEq/L (ref 3.5–5.1)
Sodium: 141 mEq/L (ref 135–145)

## 2012-01-13 LAB — HEMOGLOBIN: Hemoglobin: 14.4 g/dL (ref 13.0–17.0)

## 2012-01-13 NOTE — Progress Notes (Signed)
  Subjective:    Patient ID: Charles [name], male    DOB: 12-10-1934, 77 y.o.   MRN: [id number]  HPI Here with several issues: Diabetes, on no medications, blood sugars usually 100, occasionally drops to 60, he feels slightly lethargic , symptoms decreased with an apple. Also has occasional  extreme fatigue, this happens usually after he does some yard work. Symptoms not associated with chest pain, shortness of breath, palpitation, cough or wheezing. No actual fainting. He is otherwise reactive, he walks 4 miles 3 times a week and plays golf one time a week without any problems.  Also 8 weeks ago had a mechanical fall, he was playing with his dog, landed on the right side. Since then he is having right shoulder pain, overall pain is better but he's not 100%  Also concerned about onychomycosis. He has been using Penlac for a while without improvement.  Past Medical History: Hypertension Hyperlipidemia: diet controlled , statins and zetia  intolerant   Diabetes mellitus, type II (+) retinopathy, retinal bleed ~ 2010 and mild glaucoma Transient ischemic attack, hx of (1999) hx of histoplasmosis Chronic cough, onset Jan 2011     - allergy profile January 07, 2010 >> neg     - Max GERD RX / off oils June 2 2011x 3 weeks only but no benefit     - Sinus Ct rec February 15, 2010 >> refused due to claustrophobia     - MCT neg for asthma o7/25/11       - Add 1st gen H1 February 15, 2010  >>  better   Past Surgical History: Cspine surgery (plate) Cholecystectomy  Social History: Married Retired 2 children Former smoker.  Quit in 1976.  Smoked 1 ppd x 18 yrs active, see HPI Does Patient Exercise:  yes  F H: Sister had a PE after a car trip   Review of Systems See history of present illness. Denies nausea, vomiting, diarrhea. He remembers having a stress test in the remote past. No heavy snoring, no sleepy through out the day    Objective:   Physical Exam General -- alert, well-developed.  No apparent distress.  Lungs -- normal respiratory effort, no intercostal retractions, no accessory muscle use, and normal breath sounds.   Heart-- normal rate, regular rhythm, no murmur, and no gallop.   Abdomen--soft, non-tender, no distention, no masses, no HSM, no guarding, and no rigidity.   Extremities-- no pretibial edema bilaterally ; shoulders without deformities, range of motion normal. He does have a click in the right shoulder with passive motion. He has a dystrophic left great toe nail Neurologic-- alert & oriented X3 and strength normal in all extremities. Psych-- Cognition and judgment appear intact. Alert and cooperative with normal attention span and concentration.  not anxious appearing and not depressed appearing.       Assessment & Plan:

## 2012-01-13 NOTE — Assessment & Plan Note (Signed)
Unable to treat at this point, statins and Zetia intolerant

## 2012-01-13 NOTE — Assessment & Plan Note (Addendum)
Reports occasional blood sugar in the 60s. he's not taking any medication that could induce hypoglycemia. Plan:  Recommend to keep an eye on his CBGs, definitely eat 3 meals a day + a  healthy snack in between.  Call if symptoms persist.  I also wrote a prescription for glucometer strips will check twice a day given "hypoglycemia".

## 2012-01-13 NOTE — Assessment & Plan Note (Signed)
No improvement with topical treatment. Advise patient if this is indeed onychomycosis treatment will require several months of oral therapy, medications are toxic, pt  elected not to treat.

## 2012-01-13 NOTE — Assessment & Plan Note (Signed)
Ongoing symptoms for months. Symptoms are atypical, on one hand he is able to walk for miles without problems on the other hand he gets extremely fatigued sometimes. Plan: Rule out anemia, thyroid disease. EKG shows normal sinus rhythm. Plan: Treadmill to rule out occult  angina

## 2012-01-13 NOTE — Assessment & Plan Note (Signed)
Check a BMP, BP does not seem to be over controlled. Reports SBP never less than 110

## 2012-01-14 ENCOUNTER — Encounter: Payer: Self-pay | Admitting: Internal Medicine

## 2012-01-18 ENCOUNTER — Encounter: Payer: Self-pay | Admitting: *Deleted

## 2012-02-14 ENCOUNTER — Encounter: Payer: Self-pay | Admitting: *Deleted

## 2012-02-17 ENCOUNTER — Encounter: Payer: Self-pay | Admitting: Internal Medicine

## 2012-02-17 ENCOUNTER — Ambulatory Visit (INDEPENDENT_AMBULATORY_CARE_PROVIDER_SITE_OTHER): Payer: Medicare Other | Admitting: Internal Medicine

## 2012-02-17 VITALS — BP 142/78 | HR 71 | Temp 98.0°F | Ht 70.75 in | Wt 180.0 lb

## 2012-02-17 DIAGNOSIS — R5383 Other fatigue: Secondary | ICD-10-CM

## 2012-02-17 DIAGNOSIS — Z23 Encounter for immunization: Secondary | ICD-10-CM

## 2012-02-17 DIAGNOSIS — Z0000 Encounter for general adult medical examination without abnormal findings: Secondary | ICD-10-CM

## 2012-02-17 DIAGNOSIS — I10 Essential (primary) hypertension: Secondary | ICD-10-CM

## 2012-02-17 DIAGNOSIS — R5381 Other malaise: Secondary | ICD-10-CM

## 2012-02-17 DIAGNOSIS — E119 Type 2 diabetes mellitus without complications: Secondary | ICD-10-CM

## 2012-02-17 DIAGNOSIS — E785 Hyperlipidemia, unspecified: Secondary | ICD-10-CM

## 2012-02-17 MED ORDER — ZOSTER VACCINE LIVE 19400 UNT/0.65ML ~~LOC~~ SOLR: 0.6500 mL | Freq: Once | SUBCUTANEOUS | Status: AC

## 2012-02-17 NOTE — Assessment & Plan Note (Addendum)
Td-- today Pneumonia shot-- today  Shingles immunization, information provided, Rx provided   colonoscopy-- last  11-2009, Dr. Randa Evens, negative, next in 5 years (per patient) PSA and DRE -- per  urology (per patient)  diet and exercise discussed

## 2012-02-17 NOTE — Patient Instructions (Signed)
Check the  blood pressure 2 or 3 times a week, be sure it is between 110/60 and 140/85. If it is consistently higher or lower, let me know  

## 2012-02-17 NOTE — Assessment & Plan Note (Signed)
Recent BMP is stable. See instructions

## 2012-02-17 NOTE — Assessment & Plan Note (Signed)
Unable to treat d/t statins and Zetia intolerance

## 2012-02-17 NOTE — Progress Notes (Signed)
  Subjective:    Patient ID: Charles [name], male    DOB: 05-25-1935, 77 y.o.   MRN: [id number]  HPI Here for Medicare AWV: 1. Risk factors based on Past M, S, F history: yes  2. Physical Activities: active, yard work (heavy), golf  weekly , wood work  3. Depression/mood: denies ,no problems noted , occasionally. insomnia  4. Hearing: moderate problems in noisy environments, slt worse, declined referral; has chronic B              tinnitus  5. ADL's:  totally independent  6. Fall Risk: no falls , prevention discussed  7. Home Safety: feels safe at home  8. Height, weight, &visual acuity: see VS, poor vision (cataracts, retinopathy--f/u closely w/                         ophtalmology) 9. Counseling: yes , see below  10. Labs ordered based on risk factors: yes  11.        Referral Coordination: if needed  12.        Care Plan: see a/p  13.            Cognitive Assessment : motor skills, memory and cognition seem appropiate   in addition, we discussed the following issues BPH, symptoms well-controlled, sees urology. Hypertension, good medication compliance, ambulatory BPs never more than 145/75. Diabetes, CBG is usually around 120. See assessment and plan. Fatigue, see assessment and plan   Past Medical History: Hypertension Hyperlipidemia: diet controlled , statins and zetia  intolerant   Diabetes mellitus, type II BPH (+) retinopathy, retinal bleed ~ 2010 and mild glaucoma Transient ischemic attack, hx of (1999) hx of histoplasmosis Chronic cough, onset Jan 2011     - allergy profile January 07, 2010 >> neg     - Max GERD RX / off oils June 2 2011x 3 weeks only but no benefit     - Sinus Ct rec February 15, 2010 >> refused due to claustrophobia     - MCT neg for asthma o7/25/11       - Add 1st gen H1 February 15, 2010  >>  better   Past Surgical History: Cspine surgery (plate) Cholecystectomy  Social History: Married, Retired, 2 children Former smoker.  Quit in 1976.  Smoked 1 ppd  x 18 yrs ETOH-- no Diet-- described as healthy   Family  History Sister had a PE after a car trip CAD-- no Strokes-- F started  at age 62s DM-- several  Colon ca-- F dx at age 59 Prostate ca-- no  Review of Systems No chest pain or shortness of breath No nausea, vomiting, diarrhea or blood in the stools. No dysuria or gross hematuria.    Objective:   Physical Exam General -- alert, well-developed, and well-nourished.   Neck --no thyromegaly , normal carotid pulse Lungs -- normal respiratory effort, no intercostal retractions, no accessory muscle use, and normal breath sounds.   Heart-- normal rate, regular rhythm, no murmur, and no gallop.   Abdomen--soft, non-tender, no distention, no masses, no HSM, no guarding, and no rigidity.no bruit    Extremities-- no pretibial edema bilaterally Neurologic-- alert & oriented X3 and strength normal in all extremities. Psych-- Cognition and judgment appear intact. Alert and cooperative with normal attention span and concentration.  not anxious appearing and not depressed appearing.       Assessment & Plan:

## 2012-02-17 NOTE — Assessment & Plan Note (Signed)
Since the last time we talk about this issue, a stress test was ordered but not done. Patient reports that fatigue is related CBGs less than 110 and usually improves after he eats something (Patient does not take any DM med). At this point is not interested in pursuing a stress test, states he feels really well in general. Plan: Observation

## 2012-02-17 NOTE — Assessment & Plan Note (Signed)
Well controlled per recent hemoglobin A1c. Diet and exercise are healthy. No change

## 2012-04-20 ENCOUNTER — Encounter: Payer: Self-pay | Admitting: Family Medicine

## 2012-04-20 ENCOUNTER — Ambulatory Visit (INDEPENDENT_AMBULATORY_CARE_PROVIDER_SITE_OTHER): Payer: Medicare Other | Admitting: Family Medicine

## 2012-04-20 VITALS — BP 125/78 | HR 73 | Temp 97.7°F | Ht 70.5 in | Wt 176.4 lb

## 2012-04-20 DIAGNOSIS — T6391XA Toxic effect of contact with unspecified venomous animal, accidental (unintentional), initial encounter: Secondary | ICD-10-CM

## 2012-04-20 DIAGNOSIS — T63461A Toxic effect of venom of wasps, accidental (unintentional), initial encounter: Secondary | ICD-10-CM | POA: Insufficient documentation

## 2012-04-20 MED ORDER — METHYLPREDNISOLONE ACETATE 80 MG/ML IJ SUSP
80.0000 mg | Freq: Once | INTRAMUSCULAR | Status: AC
  Administered 2012-04-20: 80 mg via INTRAMUSCULAR

## 2012-04-20 MED ORDER — PREDNISONE 20 MG PO TABS: ORAL_TABLET | ORAL | Status: DC

## 2012-04-20 NOTE — Patient Instructions (Addendum)
This is a very large hive (urticaria) Continue benadryl as needed Start the prednisone tomorrow morning- take both pills at the same time, take w/ food The prednisone may cause your sugars to go up in the short term- this is to be expected so please watch what you are eating Call with any questions or concerns Hang in there!!!

## 2012-04-20 NOTE — Progress Notes (Signed)
  Subjective:    Patient ID: Charles [name], male    DOB: 1935/01/09, 77 y.o.   MRN: [id number]  HPI Insect sting- occurred 1 week ago.  pt believes it was a yellow jacket, was stung on L upper arm.  That night, arm got very painful.  Took antihistamine w/ some relief.  Wednesday are was very swollen w/ 'little raised places'.  Used topical steroid cream w/out relief.  Then put vinegar on arm.  Resumed antihistamine yesterday w/ some relief.  Swelling will occur down the arm, + redness, 'really severe itching and burning'   Review of Systems For ROS see HPI     Objective:   Physical Exam  Vitals reviewed. Constitutional: He appears well-developed and well-nourished. No distress.  Skin: Skin is warm and dry. There is erythema (pt w/ large urticaria on L upper and lower arm (2 separate areas)).       No redness or swelling at site of sting, no evidence of superimposed infection.          Assessment & Plan:

## 2012-04-20 NOTE — Assessment & Plan Note (Signed)
New.  Site of sting is clean and w/out evidence of infxn.  Pt w/ 2 large hives on arm inferior to sting/bite.  depomedrol given in office, start steroids tomorrow.  Benadryl prn.  Reviewed supportive care and red flags that should prompt return.  Pt expressed understanding and is in agreement w/ plan.

## 2012-04-25 ENCOUNTER — Encounter (HOSPITAL_COMMUNITY): Payer: Self-pay | Admitting: *Deleted

## 2012-04-25 ENCOUNTER — Emergency Department (HOSPITAL_COMMUNITY)
Admission: EM | Admit: 2012-04-25 | Discharge: 2012-04-25 | Disposition: A | Discharge status: Home / Self Care | Payer: Medicare Other | Attending: Emergency Medicine | Admitting: Emergency Medicine

## 2012-04-25 DIAGNOSIS — IMO0002 Reserved for concepts with insufficient information to code with codable children: Secondary | ICD-10-CM

## 2012-04-25 DIAGNOSIS — E119 Type 2 diabetes mellitus without complications: Secondary | ICD-10-CM | POA: Insufficient documentation

## 2012-04-25 DIAGNOSIS — E785 Hyperlipidemia, unspecified: Secondary | ICD-10-CM | POA: Insufficient documentation

## 2012-04-25 DIAGNOSIS — W261XXA Contact with sword or dagger, initial encounter: Secondary | ICD-10-CM | POA: Insufficient documentation

## 2012-04-25 DIAGNOSIS — I10 Essential (primary) hypertension: Secondary | ICD-10-CM | POA: Insufficient documentation

## 2012-04-25 DIAGNOSIS — S61209A Unspecified open wound of unspecified finger without damage to nail, initial encounter: Secondary | ICD-10-CM | POA: Insufficient documentation

## 2012-04-25 DIAGNOSIS — Z79899 Other long term (current) drug therapy: Secondary | ICD-10-CM | POA: Insufficient documentation

## 2012-04-25 DIAGNOSIS — W260XXA Contact with knife, initial encounter: Secondary | ICD-10-CM | POA: Insufficient documentation

## 2012-04-25 NOTE — ED Provider Notes (Signed)
History     CSN: [id number]  Arrival date & time 04/25/12  1811   First MD Initiated Contact with Patient 04/25/12 2128      Chief Complaint  Patient presents with  . Extremity Laceration    (Consider location/radiation/quality/duration/timing/severity/associated sxs/prior treatment) HPI Comments: Patient presents with a finger laceration that occurred earlier this evening. He reports cutting up chicken and cutting his finger. The laceration is located on his left 5th finger. He reports copious amount of blood. He held pressure in the wound to stop the bleeding. He reports moderate pain that does not radiate. He denies numbness, tingling, weakness of finger. He takes 81mg  aspirin as blood thinner.    Past Medical History  Diagnosis Date  . Hypertension   . Hyperlipidemia     diet controlled, statins and zetia intolerant  . Diabetes mellitus   . Retinopathy     mild glaucomea  . Transient ischemic attack 1999  . Histoplasmosis   . Chronic cough 08/2009    -allergy profile- june 2,2011- neg. -Max GERD rx/ off oils January 07 2010 x 3 weeks only but no benefit. - Sinus CT rec July 11,2011- refused due to claustrophobia -MCT neg for asthma 03/01/10 -Add 1st Gen H1 July 11,2011- better    Past Surgical History  Procedure Date  . Cervical spine surgery     plate  . Cholecystectomy     Family History  Problem Relation Age of Onset  . Coronary artery disease Neg Hx   . Stroke Neg Hx   . Colon cancer Neg Hx   . Prostate cancer Neg Hx   . Atopy Neg Hx   . Asthma Neg Hx   . Hypertension Mother   . Hypertension Father   . Diabetes Father   . Cancer Father     colon  . Hypertension Brother   . Cancer Brother     lung  . Hypertension Sister   . Cancer Sister     lung  . Diabetes Brother   . Diabetes Sister   . Diabetes      Grandparents   . Lung cancer Brother   . Lung cancer Sister     History  Substance Use Topics  . Smoking status: Former Games developer  . Smokeless  tobacco: Not on file   Comment: Stopped at age 41   . Alcohol Use: No      Review of Systems  Skin: Positive for wound.  All other systems reviewed and are negative.    Allergies  Atorvastatin; Ezetimibe; and Lovastatin  Home Medications   Current Outpatient Rx  Name Route Sig Dispense Refill  . ASPIRIN 81 MG PO TABS Oral Take 81 mg by mouth daily.      Marland Kitchen FINASTERIDE 5 MG PO TABS Oral Take 5 mg by mouth daily.      Marland Kitchen FREESTYLE LITE TEST VI STRP  USE AS DIRECTED THREE TIMES DAILY 100 each 6  . FREESTYLE LITE TEST VI In Vitro by In Vitro route.      Marland Kitchen KRILL OIL 1000 MG PO CAPS Oral Take 1 capsule by mouth daily.      Marland Kitchen FREESTYLE LANCETS MISC Other 1 each by Other route as needed. Use as instructed     . PREDNISONE 20 MG PO TABS  2 tabs daily x5 days.  Take both tabs at same time. 10 tablet 0  . TRIAMTERENE-HCTZ 37.5-25 MG PO TABS  TAKE 1 TABLET BY MOUTH EVERY DAY 90 tablet  1    BP 164/76  Pulse 68  Temp 98.4 F (36.9 C) (Oral)  Resp 18  SpO2 100%  Physical Exam  Nursing note and vitals reviewed. Constitutional: He is oriented to person, place, and time. He appears well-developed and well-nourished. No distress.  HENT:  Head: Normocephalic and atraumatic.  Eyes: Conjunctivae normal and EOM are normal. No scleral icterus.  Neck: Normal range of motion. Neck supple.  Cardiovascular: Normal rate, regular rhythm and intact distal pulses.  Exam reveals no gallop and no friction rub.   No murmur heard.      Sufficient capillary refill of hands.   Pulmonary/Chest: Effort normal and breath sounds normal.  Abdominal: He exhibits no distension.  Musculoskeletal: Normal range of motion.  Neurological: He is alert and oriented to person, place, and time. Coordination normal.       Strength and sensation equal and intact bilaterally.   Skin: Skin is warm and dry. He is not diaphoretic.       1 cm horizontal laceration of distal left 5th finger that is no longer bleeding.      Psychiatric: He has a normal mood and affect. His behavior is normal.    ED Course  Procedures (including critical care time)  LACERATION REPAIR Performed by: Emilia Beck Authorized by: Emilia Beck Consent: Verbal consent obtained. Risks and benefits: risks, benefits and alternatives were discussed Consent given by: patient Patient identity confirmed: provided demographic data Prepped and Draped in normal sterile fashion Wound explored  Laceration Location: distal left 5th finger  Laceration Length: 1.0 cm  No Foreign Bodies seen or palpated  Anesthesia: local infiltration  Local anesthetic: lidocaine 2% without epinephrine  Anesthetic total: 1.0  ml  Irrigation method: syringe Amount of cleaning: standard  Skin closure: 4.0 prolene  Number of sutures: 3  Technique: simple  Patient tolerance: Patient tolerated the procedure well with no immediate complications.   Labs Reviewed - No data to display No results found.   No diagnosis found.    MDM  9:56 PM Patient's laceration repaired and dressed without complication. Patient instructed to go to PCP in 10 days for suture removal. Patient declines pain medication. Tetanus not given because patient had one 10 days ago. No further evaluation needed at this time.         Emilia Beck, PA-C 04/25/12 2204

## 2012-04-25 NOTE — ED Notes (Signed)
Pt cut left small finger while cutting up chicken; presents with laceration to end of pinky finger; bleeding controlled with pressure; last tetanus ten days ago

## 2012-04-26 NOTE — ED Provider Notes (Signed)
Medical screening examination/treatment/procedure(s) were performed by non-physician practitioner and as supervising physician I was immediately available for consultation/collaboration.  Brazos Sandoval T Jakolby Sedivy, MD 04/26/12 2326 

## 2012-05-08 ENCOUNTER — Ambulatory Visit: Payer: Medicare Other | Admitting: Internal Medicine

## 2012-06-14 ENCOUNTER — Other Ambulatory Visit: Payer: Self-pay | Admitting: Internal Medicine

## 2012-06-14 NOTE — Telephone Encounter (Signed)
Refill done.  

## 2012-12-18 ENCOUNTER — Ambulatory Visit (INDEPENDENT_AMBULATORY_CARE_PROVIDER_SITE_OTHER): Payer: Medicare Other | Admitting: Internal Medicine

## 2012-12-18 VITALS — BP 128/74 | HR 72 | Temp 98.0°F | Wt 180.0 lb

## 2012-12-18 DIAGNOSIS — I10 Essential (primary) hypertension: Secondary | ICD-10-CM

## 2012-12-18 DIAGNOSIS — E119 Type 2 diabetes mellitus without complications: Secondary | ICD-10-CM

## 2012-12-18 DIAGNOSIS — I209 Angina pectoris, unspecified: Secondary | ICD-10-CM

## 2012-12-18 DIAGNOSIS — I208 Other forms of angina pectoris: Secondary | ICD-10-CM | POA: Insufficient documentation

## 2012-12-18 DIAGNOSIS — R079 Chest pain, unspecified: Secondary | ICD-10-CM

## 2012-12-18 LAB — CBC WITH DIFFERENTIAL/PLATELET
Basophils Relative: 0.7 % (ref 0.0–3.0)
Eosinophils Relative: 2.1 % (ref 0.0–5.0)
HCT: 41.1 % (ref 39.0–52.0)
Hemoglobin: 13.9 g/dL (ref 13.0–17.0)
Lymphs Abs: 2.6 10*3/uL (ref 0.7–4.0)
MCV: 90.9 fl (ref 78.0–100.0)
Monocytes Absolute: 0.6 10*3/uL (ref 0.1–1.0)
Monocytes Relative: 8.8 % (ref 3.0–12.0)
Platelets: 214 10*3/uL (ref 150.0–400.0)
RBC: 4.52 Mil/uL (ref 4.22–5.81)
WBC: 6.6 10*3/uL (ref 4.5–10.5)

## 2012-12-18 LAB — COMPREHENSIVE METABOLIC PANEL
Alkaline Phosphatase: 50 U/L (ref 39–117)
BUN: 25 mg/dL — ABNORMAL HIGH (ref 6–23)
CO2: 31 mEq/L (ref 19–32)
Creatinine, Ser: 1.5 mg/dL (ref 0.4–1.5)
GFR: 48.37 mL/min — ABNORMAL LOW (ref >60.00)
Glucose, Bld: 106 mg/dL — ABNORMAL HIGH (ref 70–99)
Sodium: 140 mEq/L (ref 135–145)
Total Bilirubin: 0.6 mg/dL (ref 0.3–1.2)

## 2012-12-18 MED ORDER — NITROGLYCERIN 0.4 MG SL SUBL: 0.4000 mg | SUBLINGUAL_TABLET | SUBLINGUAL | Status: DC | PRN

## 2012-12-18 NOTE — Patient Instructions (Addendum)
Increase aspirin to 4 tablets a day ( or one aspirin 325 mg daily) If chest pain, take nitroglycerin, one tablet under tongue every 5 minutes 3 times. If the pain continue after 10 minutes, call 911. ---- Please get your x-ray at the other [facility]  office located at: [address] [place], across from [facility].  Please go to the basement, this is a walk-in facility, they are open from 8:30 to 5:30 PM. Phone number [phone].

## 2012-12-18 NOTE — Progress Notes (Signed)
Subjective:    Patient ID: Charles [name], male    DOB: May 30, 1935, 78 y.o.   MRN: [id number]  HPI Last visit 02-2012 The patient main concern today is chest pain. Chest pain started approximately 4 months ago, initially had sx walking 2-3 blocks, gradually is getting worse and has pain with less exercise. Now he develops pain going to the second floor of his house. Yesterday, he did some yard work and the pain lasted "longer than usual" --->  few hours. The pain is located at the upper anterior chest, some radiation to the left shoulder, no radiation to the neck. Is not associated with nausea, vomiting, diaphoresis or feeling fainty.  Past Medical History: Hypertension Hyperlipidemia: diet controlled , statins and zetia  intolerant   Diabetes mellitus, type II BPH (+) retinopathy, retinal bleed ~ 2010 and mild glaucoma Transient ischemic attack, hx of (1999) hx of histoplasmosis Chronic cough, onset Jan 2011     - allergy profile January 07, 2010 >> neg     - Max GERD RX / off oils June 2 2011x 3 weeks only but no benefit     - Sinus Ct rec February 15, 2010 >> refused due to claustrophobia     - MCT neg for asthma o7/25/11       - Add 1st gen H1 February 15, 2010  >>  better   Past Surgical History: Cspine surgery (plate) Cholecystectomy  Social History: Married, Retired, 2 children Former smoker.  Quit in 1976.  Smoked 1 ppd x 18 yrs ETOH-- no Diet-- described as healthy   Family  History Sister had a PE after a car trip CAD-- no Strokes-- F started  at age 109s DM-- several   Colon ca-- F dx at age 47 Prostate ca-- no   Review of Systems No recent airplane trip. Has chronic cough, it has been an  issue for the last couple of months. No fever or chills. No LE edema,no calves pain. no abdominal pain, no GERD symptoms. He is not taking any new medication.     Objective:   Physical Exam  General -- alert, well-developed,No apparent distress Neck --no thyromegaly   Lungs -- normal respiratory effort, no intercostal retractions, no accessory muscle use, and normal breath sounds.   Heart-- normal rate, regular rhythm, no murmur, and no gallop.   Abdomen--soft, non-tender, no distention, no masses, no HSM, no guarding, and no rigidity.   Extremities-- no pretibial edema bilaterally Neurologic-- alert & oriented X3 and strength normal in all extremities. Psych-- Cognition and judgment appear intact. Alert and cooperative with normal attention span and concentration.  not anxious appearing and not depressed appearing.      Assessment & Plan:  Angina , 78 year-old gentleman with hypertension, untreated hyperlipidemia and diabetes who presented with history consistent with crescendo angina. EKGs show normal sinus rhythm My advice is to be admitted to the hospital, call for cardiology, likely he will need a cardiac catheterization. The patient strongly refuses, states he can not go to the hospital at this point due to some family issues. In no uncertain terms I told him that I am very concerned about his symptoms, if this is his heart, he could have a heart attack at any point in the near future,risk of death, CHF discuss. The patient still refuses to go to the hospital. I asked him to see the cardiology tomorrow, states he can't go, likes to be seen in 2 days. Plan: Set up an  outpatient cardiology referral. Increase aspirin to 325 Nitroglycerin when necessary Labs and chest x-ray 911 is chest pain lasts more than 10 minutes.

## 2012-12-19 ENCOUNTER — Encounter: Payer: Self-pay | Admitting: Internal Medicine

## 2012-12-19 ENCOUNTER — Ambulatory Visit (INDEPENDENT_AMBULATORY_CARE_PROVIDER_SITE_OTHER)
Admission: RE | Admit: 2012-12-19 | Discharge: 2012-12-19 | Disposition: A | Discharge status: Home / Self Care | Payer: Medicare Other | Source: Ambulatory Visit | Attending: Internal Medicine | Admitting: Internal Medicine

## 2012-12-19 DIAGNOSIS — R079 Chest pain, unspecified: Secondary | ICD-10-CM

## 2012-12-19 NOTE — Assessment & Plan Note (Signed)
Check a hemoglobin A1c 

## 2012-12-19 NOTE — Assessment & Plan Note (Signed)
Seems  well-controlled, labs 

## 2012-12-20 ENCOUNTER — Encounter: Payer: Self-pay | Admitting: Cardiovascular Disease

## 2012-12-20 ENCOUNTER — Ambulatory Visit (INDEPENDENT_AMBULATORY_CARE_PROVIDER_SITE_OTHER): Payer: Medicare Other | Admitting: Cardiovascular Disease

## 2012-12-20 ENCOUNTER — Telehealth: Payer: Self-pay | Admitting: General Practice

## 2012-12-20 VITALS — BP 150/90 | HR 70 | Ht 71.0 in | Wt 179.2 lb

## 2012-12-20 DIAGNOSIS — R079 Chest pain, unspecified: Secondary | ICD-10-CM

## 2012-12-20 DIAGNOSIS — I10 Essential (primary) hypertension: Secondary | ICD-10-CM

## 2012-12-20 DIAGNOSIS — E785 Hyperlipidemia, unspecified: Secondary | ICD-10-CM

## 2012-12-20 DIAGNOSIS — I208 Other forms of angina pectoris: Secondary | ICD-10-CM

## 2012-12-20 DIAGNOSIS — E119 Type 2 diabetes mellitus without complications: Secondary | ICD-10-CM

## 2012-12-20 DIAGNOSIS — I209 Angina pectoris, unspecified: Secondary | ICD-10-CM

## 2012-12-20 NOTE — Telephone Encounter (Signed)
Pt wife called in wanting some advise from Pt appointment with Dr. Drue Novel on 5/13. No DPR on file but since Pt was in with Xray and could not be spoken with advised Pt wife that the next step would be a Cardiology Outpatient Referral per Dr. Drue Novel and that someone from our office would be in contact with her to set up this appt.

## 2012-12-20 NOTE — Patient Instructions (Signed)
Will follow up after stress test to discuss whether further evaluation is necessary. Please continue taking aspirin on a daily basis. Take nitroglycerin sublingually one tablet as needed for chest pain. You may repeat this every 5 minutes until symptoms are relieved, but call for emergency medical assistance if symptoms are not relieved by the third sublingual tablet. We will discuss how aggressive we should be regarding treatment of high cholesterol depending on the results of the stress test.

## 2012-12-20 NOTE — Assessment & Plan Note (Signed)
Charles [name] reports symptoms that are highly consistent with effort angina, as Dr. Lottie Dawson has very astutely pointed out

## 2012-12-20 NOTE — Progress Notes (Signed)
Patient ID: Charles [name], male   DOB: February 13, 1935, 78 y.o.   MRN: [id number] HPI  Charles [name] is referred in consultation by Dr. Drue Novel, who has very astutely diagnosed exertional angina. Charles [name] is in the habit of walking about 4 miles a day but over the winter he noticed that during the cold weather he would develop chest tightness. Because of this he curtailed his activity. He thought that he was due to some type of bronchial problem.  When the weather improves he begin his walking routine again and the symptoms returned. He has also noticed that walking uphill causes dyspnea in addition to chest discomfort. If he pushes himself he can sometimes "walk through" the chest discomfort. He has never experienced chest pain or dyspnea at rest. A few weeks ago he broke some post holes and had more severe and prolonged chest discomfort.  He has moderate dyslipidemia with elevated total and LDL cholesterol. Attempts at statin treatment (atorvastatin and lovastatin) were successful in lowering LDL cholesterol levels were associated with myalgias. These medicines were then stopped. He had dizziness with zetia.  He has diabetes mellitus that is well controlled with diet and exercise. He smoked in the remote past but has not smoked in over 30 years. There is no family history of premature coronary disease.   He had a transient ischemic attack in the 1990s and reportedly carotid ultrasound performed at that time was normal. Lab tests show moderate renal impairment with a creatinine clearance of around 50 mL per minute.  Allergies  Allergen Reactions  . Atorvastatin     REACTION: myalgia  . Ezetimibe     REACTION: dizzy  . Lovastatin     REACTION: weakness    Current Outpatient Prescriptions  Medication Sig Dispense Refill  . aspirin 81 MG tablet Take 81 mg by mouth daily.        . finasteride (PROSCAR) 5 MG tablet Take 5 mg by mouth daily.        Marland Kitchen FREESTYLE LITE test strip USE AS DIRECTED THREE TIMES  DAILY  100 each  6  . Glucose Blood (FREESTYLE LITE TEST VI) by In Vitro route.        Marland Kitchen KRILL OIL 1000 MG CAPS Take 1 capsule by mouth daily.        . Lancets (FREESTYLE) lancets 1 each by Other route as needed. Use as instructed       . nitroGLYCERIN (NITROSTAT) 0.4 MG SL tablet Place 1 tablet (0.4 mg total) under the tongue every 5 (five) minutes as needed for chest pain.  20 tablet  3  . triamterene-hydrochlorothiazide (MAXZIDE-25) 37.5-25 MG per tablet TAKE 1 TABLET BY MOUTH EVERY DAY  90 tablet  1   No current facility-administered medications for this visit.    Past Medical History  Diagnosis Date  . Hypertension   . Hyperlipidemia     diet controlled, statins and zetia intolerant  . Diabetes mellitus   . Retinopathy     mild glaucomea  . Transient ischemic attack 1999  . Histoplasmosis   . Chronic cough 08/2009    -allergy profile- june 2,2011- neg. -Max GERD rx/ off oils January 07 2010 x 3 weeks only but no benefit. - Sinus CT rec July 11,2011- refused due to claustrophobia -MCT neg for asthma 03/01/10 -Add 1st Gen H1 July 11,2011- better  . Macular degeneration     Past Surgical History  Procedure Laterality Date  . Cervical spine surgery  2002  plate  . Cholecystectomy  1985    Family History  Problem Relation Age of Onset  . Coronary artery disease Neg Hx   . Stroke Neg Hx   . Colon cancer Neg Hx   . Prostate cancer Neg Hx   . Atopy Neg Hx   . Asthma Neg Hx   . Hypertension Mother   . Hypertension Father   . Diabetes Father   . Cancer Father     colon  . Hypertension Brother   . Cancer Brother     lung  . Hypertension Sister   . Cancer Sister     lung  . Diabetes Brother   . Diabetes Sister   . Diabetes      Grandparents   . Lung cancer Brother   . Lung cancer Sister     History   Social History  . Marital Status: Married    Spouse Name: N/A    Number of Children: 2  . Years of Education: N/A   Occupational History  . reitred    Social  History Main Topics  . Smoking status: Former Games developer  . Smokeless tobacco: Not on file     Comment: Stopped at age 74   . Alcohol Use: Yes     Comment: seldom  . Drug Use: No  . Sexually Active: Not on file   Other Topics Concern  . Not on file   Social History Narrative  . No narrative on file    ROS: He describes exertional chest discomfort and exertional dyspnea that appeared to occur in a predictable pattern, consistent relieved by rest. There is no clear evidence of a progressive for accelerating pattern. The more intense recent symptoms appear to be related to more intense physical activity. He appears to have erectile dysfunction as well.  The patient specifically denies any chest pain at rest, dyspnea at rest orthopnea, paroxysmal nocturnal dyspnea, syncope, palpitations, focal neurological deficits, intermittent claudication, lower extremity edema, unexplained weight gain, cough, hemoptysis or wheezing.  PHYSICAL EXAM BP 150/90  Pulse 70  Ht 5\' 11"  (1.803 m)  Wt 179 lb 3.2 oz (81.285 kg)  BMI 25 kg/m2  General: Alert, oriented x3, no distress Head: no evidence of trauma, PERRL, EOMI, no exophtalmos or lid lag, no myxedema, no xanthelasma; normal ears, nose and oropharynx Neck: normal jugular venous pulsations and no hepatojugular reflux; brisk carotid pulses without delay and no carotid bruits Chest: clear to auscultation, no signs of consolidation by percussion or palpation, normal fremitus, symmetrical and full respiratory excursions Cardiovascular: normal position and quality of the apical impulse, regular rhythm, normal first and second heart sounds, no murmurs, rubs or gallops Abdomen: no tenderness or distention, no masses by palpation, no abnormal pulsatility or arterial bruits, normal bowel sounds, no hepatosplenomegaly Extremities: no clubbing, cyanosis or edema; 2+ radial, ulnar and brachial pulses bilaterally; 2+ right femoral, posterior tibial and dorsalis pedis  pulses; 2+ left femoral, posterior tibial and dorsalis pedis pulses; no subclavian or femoral bruits Neurological: grossly nonfocal   EKG: Normal sinus rhythm no repolarization abnormalities no sign of previous myocardial injury  Lipid Panel     Component Value Date/Time   CHOL 228* 07/25/2011 0846   TRIG 159.0* 07/25/2011 0846   HDL 51.70 07/25/2011 0846   CHOLHDL 4 07/25/2011 0846   VLDL 31.8 07/25/2011 0846   LDLCALC 70 09/27/2007 0818   direct LDL on the labs from 2012 is 151  BMET    Component Value Date/Time  NA 140 12/18/2012 1424   K 3.6 12/18/2012 1424   CL 102 12/18/2012 1424   CO2 31 12/18/2012 1424   GLUCOSE 106* 12/18/2012 1424   BUN 25* 12/18/2012 1424   CREATININE 1.5 12/18/2012 1424   CALCIUM 9.4 12/18/2012 1424   GFRNONAA 52.75 04/23/2010 0000   GFRAA 85 09/30/2008 0907     ASSESSMENT AND PLAN:   I believe it is highly likely that Mr. Burgen has coronary artery disease. The severity and extent of this problem cannot be readily determined on clinical grounds alone. We discussed the fact that as long as his symptoms remain predictable and exertion-related there is no reason to perform urgent invasive procedures, but that it firmly establishing the diagnosis of coronary disease is important. This would lead to more aggressive attempts at risk factor modification including in my opinion, another attempt at statin therapy.  We discussed the relative virtues of functional imaging (stress nuclear scan) versus proceeding directly to coronary angiography. Since his symptoms are exertional related only and he has some degree of renal impairment we have decided to start off with a nuclear stress test. If there is evidence of a large reversible defect he should then proceed to coronary angiography with appropriate measures taken to protect renal function.  It is important that he continue taking daily aspirin. He has received a prescription for sublingual nitroglycerin from Dr. Drue Novel.  and we reviewed the appropriate way of taking this medication.  Saarah Dewing

## 2012-12-28 ENCOUNTER — Other Ambulatory Visit: Payer: Self-pay | Admitting: Internal Medicine

## 2012-12-28 NOTE — Telephone Encounter (Signed)
Refill done.  

## 2013-01-04 ENCOUNTER — Other Ambulatory Visit: Payer: Self-pay

## 2013-01-04 ENCOUNTER — Ambulatory Visit (HOSPITAL_COMMUNITY)
Admission: RE | Admit: 2013-01-04 | Discharge: 2013-01-04 | Disposition: A | Discharge status: Home / Self Care | Payer: Medicare Other | Source: Ambulatory Visit | Attending: Cardiovascular Disease | Admitting: Cardiovascular Disease

## 2013-01-04 DIAGNOSIS — R079 Chest pain, unspecified: Secondary | ICD-10-CM | POA: Insufficient documentation

## 2013-01-04 DIAGNOSIS — I2089 Other forms of angina pectoris: Secondary | ICD-10-CM

## 2013-01-04 DIAGNOSIS — E119 Type 2 diabetes mellitus without complications: Secondary | ICD-10-CM | POA: Insufficient documentation

## 2013-01-04 DIAGNOSIS — R5381 Other malaise: Secondary | ICD-10-CM | POA: Insufficient documentation

## 2013-01-04 DIAGNOSIS — R42 Dizziness and giddiness: Secondary | ICD-10-CM | POA: Insufficient documentation

## 2013-01-04 DIAGNOSIS — R0989 Other specified symptoms and signs involving the circulatory and respiratory systems: Secondary | ICD-10-CM | POA: Insufficient documentation

## 2013-01-04 DIAGNOSIS — R0609 Other forms of dyspnea: Secondary | ICD-10-CM | POA: Insufficient documentation

## 2013-01-04 DIAGNOSIS — I208 Other forms of angina pectoris: Secondary | ICD-10-CM

## 2013-01-04 DIAGNOSIS — Z8673 Personal history of transient ischemic attack (TIA), and cerebral infarction without residual deficits: Secondary | ICD-10-CM | POA: Insufficient documentation

## 2013-01-04 DIAGNOSIS — E663 Overweight: Secondary | ICD-10-CM | POA: Insufficient documentation

## 2013-01-04 DIAGNOSIS — I10 Essential (primary) hypertension: Secondary | ICD-10-CM | POA: Insufficient documentation

## 2013-01-04 MED ORDER — TECHNETIUM TC 99M SESTAMIBI GENERIC - CARDIOLITE
32.0000 | Freq: Once | INTRAVENOUS | Status: AC | PRN
  Administered 2013-01-04: 32 via INTRAVENOUS

## 2013-01-04 MED ORDER — TECHNETIUM TC 99M SESTAMIBI GENERIC - CARDIOLITE
11.0000 | Freq: Once | INTRAVENOUS | Status: AC | PRN
  Administered 2013-01-04: 11 via INTRAVENOUS

## 2013-01-04 NOTE — Procedures (Addendum)
[facility] [address] [place] [address] [phone]  Cardiology Nuclear Med Study  Charles [name] is a 78 y.o. male     MRN : [id number]     DOB: 19-Apr-1935  Procedure Date: 01/04/2013  Nuclear Med Background Indication for Stress Test:  Evaluation for Ischemia History:  NO PRIOR HISTORY REPORTED Cardiac Risk Factors: History of Smoking, Hypertension, Lipids, NIDDM, Overweight and TIA  Symptoms:  Chest Pain, Dizziness, DOE, Fatigue and Light-Headedness   Nuclear Pre-Procedure Caffeine/Decaff Intake:  1:00am NPO After: 11 AM   IV Site: R Hand  IV 0.9% NS with Angio Cath:  22g  Chest Size (in):  44"  IV Started by: Emmit Pomfret, RN  Height: 5\' 11"  (1.803 m)  Cup Size: n/a  BMI:  Body mass index is 24.98 kg/(m^2). Weight:  179 lb (81.194 kg)   Tech Comments:  N/A    Nuclear Med Study 1 or 2 day study: 1 day  Stress Test Type:  Stress  Order Authorizing Provider:  Thurmon Fair, MD   Resting Radionuclide: Technetium 37m Sestamibi  Resting Radionuclide Dose: 11.0 mCi   Stress Radionuclide:  Technetium 2m Sestamibi  Stress Radionuclide Dose: 32.0 mCi           Stress Protocol Rest HR: 86 Stress HR: 129  Rest BP: 151/75 Stress BP: 188/78  Exercise Time (min): 6:35 METS: 7.90   Predicted Max HR: 144 bpm % Max HR: 89.58 bpm Rate Pressure Product: 21308  Dose of Adenosine (mg):  n/a Dose of Lexiscan: n/a mg  Dose of Atropine (mg): n/a Dose of Dobutamine: n/a mcg/kg/min (at max HR)  Stress Test Technologist: Ernestene Mention, CCT Nuclear Technologist: Gonzella Lex, CNMT   Rest Procedure:  Myocardial perfusion imaging was performed at rest 45 minutes following the intravenous administration of Technetium 54m Sestamibi. Stress Procedure:  The patient performed treadmill exercise using a Bruce  Protocol for 6 minutes and 35 seconds. The patient stopped due to a burning sensation in chest. Patient denied chest pain.  There were significant ST-T wave changes.  Technetium 20m Sestamibi was injected at peak exercise and myocardial perfusion imaging was performed after a brief delay.  Transient Ischemic Dilatation (Normal <1.22):  1.13 Lung/Heart Ratio (Normal <0.45):  0.33 QGS EDV:  62 ml QGS ESV:  25 ml LV Ejection Fraction: 59%  Signed by Gonzella Lex, CNMT  PHYSICIAN INTERPRETATION  Rest ECG: NSR - Normal EKG  Stress ECG: Significant ST abnormalities consistent with ischemia. 3-4 mm ST Depressions in II, III, aVF, 1 m STE in aVL (3 mm STE aVR), ~2 mm ST Depression V4-V6. Finally returned to baseline by ~10 min into recovery  QPS Raw Data Images:  Acquisition technically good; normal left ventricular size. Stress Images:  There is decreased uptake in the inferior wall.  There is a small to medium sized, moderate intensity, reversible perfusion defect noted in the Mid to Apical inferior Wall. Rest Images:  Normal homogeneous uptake in all areas of the myocardium. Subtraction (SDS):  These findings are consistent with ischemia.  -- in the RCA (PDA) distribution.  Impression Exercise Capacity:  Good exercise capacity. BP Response:  Normal blood pressure response. Clinical Symptoms:  Significant chest pain.  Exercise limiting "chest burning". ECG Impression:  Significant ST abnormalities consistent with ischemia.   -- see above.  LV Wall Motion:  NL LV Function; NL Wall Motion  Comparison with Prior Nuclear Study: No images to compare  Overall  Impression: Intermediate to  High Risk Overall Study; Intermediate Risk Perfusion Imaging with grossly abnormal (High Risk) ECG portion & limiting Chest Discomfort (Angina) -- Duke TM Score is -21.5.  Recommend cardiac catheterization.   Marykay Lex, MD  01/04/2013 5:31 PM

## 2013-01-07 ENCOUNTER — Ambulatory Visit (INDEPENDENT_AMBULATORY_CARE_PROVIDER_SITE_OTHER): Payer: Medicare Other | Admitting: Cardiovascular Disease

## 2013-01-07 ENCOUNTER — Encounter: Payer: Self-pay | Admitting: Cardiovascular Disease

## 2013-01-07 ENCOUNTER — Ambulatory Visit
Admission: RE | Admit: 2013-01-07 | Discharge: 2013-01-07 | Disposition: A | Discharge status: Home / Self Care | Payer: Medicare Other | Source: Ambulatory Visit | Attending: Cardiovascular Disease | Admitting: Cardiovascular Disease

## 2013-01-07 VITALS — BP 142/76 | HR 80 | Resp 16 | Ht 71.0 in | Wt 180.0 lb

## 2013-01-07 DIAGNOSIS — R931 Abnormal findings on diagnostic imaging of heart and coronary circulation: Secondary | ICD-10-CM | POA: Insufficient documentation

## 2013-01-07 DIAGNOSIS — Z01818 Encounter for other preprocedural examination: Secondary | ICD-10-CM

## 2013-01-07 DIAGNOSIS — D689 Coagulation defect, unspecified: Secondary | ICD-10-CM

## 2013-01-07 DIAGNOSIS — I209 Angina pectoris, unspecified: Secondary | ICD-10-CM

## 2013-01-07 DIAGNOSIS — R9439 Abnormal result of other cardiovascular function study: Secondary | ICD-10-CM

## 2013-01-07 DIAGNOSIS — I208 Other forms of angina pectoris: Secondary | ICD-10-CM

## 2013-01-07 LAB — CBC
MCH: 30.4 pg (ref 26.0–34.0)
MCV: 87.9 fL (ref 78.0–100.0)
Platelets: 244 10*3/uL (ref 150–400)
RBC: 4.94 MIL/uL (ref 4.22–5.81)

## 2013-01-07 LAB — BASIC METABOLIC PANEL
BUN: 27 mg/dL — ABNORMAL HIGH (ref 6–23)
Chloride: 103 mEq/L (ref 96–112)
Glucose, Bld: 79 mg/dL (ref 70–99)
Potassium: 4.3 mEq/L (ref 3.5–5.3)

## 2013-01-07 NOTE — Patient Instructions (Signed)
Do not take triamterene HCTZ the day before the procedure or the day of the procedure. Your physician has requested that you have a cardiac catheterization. Cardiac catheterization is used to diagnose and/or treat various heart conditions. Doctors may recommend this procedure for a number of different reasons. The most common reason is to evaluate chest pain. Chest pain can be a symptom of coronary artery disease (CAD), and cardiac catheterization can show whether plaque is narrowing or blocking your heart's arteries. This procedure is also used to evaluate the valves, as well as measure the blood flow and oxygen levels in different parts of your heart. For further information please visit [email or web address] Please follow instruction sheet, as given.

## 2013-01-07 NOTE — Progress Notes (Signed)
Patient ID: Charles [name], male   DOB: 1934-09-13, 78 y.o.   MRN: [id number]  Reason for office visit Exertional angina; followup nuclear stress test  Charles [name] returns to see [place] today because his stress myocardial perfusion study showed reversible ischemia in the inferior wall. He had slightly limited functional capacity, able to exercise on the Bruce protocol for 6 minutes and 30 seconds. There were very prominent ST segment depressions in multiple leads. The nuclear images showing reversible mid-to distal inferior wall defect.  Charles [name] was referred in consultation by Dr. Drue Novel, who has very astutely diagnosed exertional angina. Charles [name] is in the habit of walking about 4 miles a day but over the winter he noticed that during the cold weather he would develop chest tightness. Because of this he curtailed his activity. He thought that he was due to some type of bronchial problem.  When the weather improves he begin his walking routine again and the symptoms returned. He has also noticed that walking uphill causes dyspnea in addition to chest discomfort. If he pushes himself he can sometimes "walk through" the chest discomfort. He has never experienced chest pain or dyspnea at rest. A few weeks ago he broke some post holes and had more severe and prolonged chest discomfort.  He has moderate dyslipidemia with elevated total and LDL cholesterol. Attempts at statin treatment (atorvastatin and lovastatin) were successful in lowering LDL cholesterol levels were associated with myalgias. These medicines were then stopped. He had dizziness with zetia.  He has diabetes mellitus that is well controlled with diet and exercise. He smoked in the remote past but has not smoked in over 30 years. There is no family history of premature coronary disease.  He had a transient ischemic attack in the 1990s and reportedly carotid ultrasound performed at that time was normal. Lab tests show moderate renal impairment with a  creatinine clearance of around 50 mL per minute.   Allergies  Allergen Reactions  . Atorvastatin     REACTION: myalgia  . Ezetimibe     REACTION: dizzy  . Lovastatin     REACTION: weakness    Current Outpatient Prescriptions  Medication Sig Dispense Refill  . aspirin 81 MG tablet Take 81 mg by mouth daily.        . finasteride (PROSCAR) 5 MG tablet Take 5 mg by mouth daily.        Marland Kitchen FREESTYLE LITE test strip USE AS DIRECTED THREE TIMES DAILY  100 each  6  . Glucose Blood (FREESTYLE LITE TEST VI) by In Vitro route.        Marland Kitchen KRILL OIL 1000 MG CAPS Take 1 capsule by mouth daily.        . nitroGLYCERIN (NITROSTAT) 0.4 MG SL tablet Place 1 tablet (0.4 mg total) under the tongue every 5 (five) minutes as needed for chest pain.  20 tablet  3  . triamterene-hydrochlorothiazide (MAXZIDE-25) 37.5-25 MG per tablet TAKE 1 TABLET BY MOUTH EVERY DAY  90 tablet  2  . Lancets (FREESTYLE) lancets 1 each by Other route as needed. Use as instructed        No current facility-administered medications for this visit.    Past Medical History  Diagnosis Date  . Hypertension   . Hyperlipidemia     diet controlled, statins and zetia intolerant  . Diabetes mellitus   . Retinopathy     mild glaucomea  . Transient ischemic attack 1999  . Histoplasmosis   . Chronic  cough 08/2009    -allergy profile- june 2,2011- neg. -Max GERD rx/ off oils January 07 2010 x 3 weeks only but no benefit. - Sinus CT rec July 11,2011- refused due to claustrophobia -MCT neg for asthma 03/01/10 -Add 1st Gen H1 July 11,2011- better  . Macular degeneration     Past Surgical History  Procedure Laterality Date  . Cervical spine surgery  2002    plate  . Cholecystectomy  1985    Family History  Problem Relation Age of Onset  . Coronary artery disease Neg Hx   . Stroke Neg Hx   . Colon cancer Neg Hx   . Prostate cancer Neg Hx   . Atopy Neg Hx   . Asthma Neg Hx   . Hypertension Mother   . Hypertension Father   . Diabetes  Father   . Cancer Father     colon  . Hypertension Brother   . Cancer Brother     lung  . Hypertension Sister   . Cancer Sister     lung  . Diabetes Brother   . Diabetes Sister   . Diabetes      Grandparents   . Lung cancer Brother   . Lung cancer Sister     History   Social History  . Marital Status: Married    Spouse Name: N/A    Number of Children: 2  . Years of Education: N/A   Occupational History  . reitred    Social History Main Topics  . Smoking status: Former Games developer  . Smokeless tobacco: Not on file     Comment: Stopped at age 15   . Alcohol Use: Yes     Comment: seldom  . Drug Use: No  . Sexually Active: Not on file   Other Topics Concern  . Not on file   Social History Narrative  . No narrative on file    Review of systems: He believes his exertional chest tightness has progressed slightly since the last appointment. It sounds like he is just more aware of it. He exercises about the same amount. The patient specifically denies any chest pain at rest, dyspnea at rest or with exertion, orthopnea, paroxysmal nocturnal dyspnea, syncope, palpitations, focal neurological deficits, intermittent claudication, lower extremity edema, unexplained weight gain, cough, hemoptysis or wheezing. Denies polyuria polydipsia frequency urgency hematuria, abdominal pain, gastrointestinal bleeding, arthralgia, major weight changes, mood swings, allergic reactions, easy bruising or bleeding, headaches, weakness, fever, chills.   PHYSICAL EXAM BP 142/76  Pulse 80  Resp 16  Ht 5\' 11"  (1.803 m)  Wt 180 lb (81.647 kg)  BMI 25.12 kg/m2  General: Alert, oriented x3, no distress Head: no evidence of trauma, PERRL, EOMI, no exophtalmos or lid lag, no myxedema, no xanthelasma; normal ears, nose and oropharynx Neck: normal jugular venous pulsations and no hepatojugular reflux; brisk carotid pulses without delay and no carotid bruits Chest: clear to auscultation, no signs of  consolidation by percussion or palpation, normal fremitus, symmetrical and full respiratory excursions Cardiovascular: normal position and quality of the apical impulse, regular rhythm, normal first and second heart sounds, no murmurs, rubs or gallops Abdomen: no tenderness or distention, no masses by palpation, no abnormal pulsatility or arterial bruits, normal bowel sounds, no hepatosplenomegaly Extremities: no clubbing, cyanosis or edema; 2+ radial, ulnar and brachial pulses bilaterally; 2+ right femoral, posterior tibial and dorsalis pedis pulses; 2+ left femoral, posterior tibial and dorsalis pedis pulses; no subclavian or femoral bruits Neurological: grossly nonfocal  EKG: Sinus rhythm    Lipid Panel     Component Value Date/Time   CHOL 228* 07/25/2011 0846   TRIG 159.0* 07/25/2011 0846   HDL 51.70 07/25/2011 0846   CHOLHDL 4 07/25/2011 0846   VLDL 31.8 07/25/2011 0846   LDLCALC 70 09/27/2007 0818    BMET    Component Value Date/Time   NA 140 12/18/2012 1424   K 3.6 12/18/2012 1424   CL 102 12/18/2012 1424   CO2 31 12/18/2012 1424   GLUCOSE 106* 12/18/2012 1424   BUN 25* 12/18/2012 1424   CREATININE 1.5 12/18/2012 1424   CALCIUM 9.4 12/18/2012 1424   GFRNONAA 52.75 04/23/2010 0000   GFRAA 85 09/30/2008 0907     ASSESSMENT AND PLAN  Abnormal nuclear cardiac imaging test Recommend coronary angiography/cardiac catheterization. We discussed the risks and benefits of diagnostic procedure as well as possible percutaneous angioplasty and stenting. He has several pertinent questions and wishes to proceed with cardiac catheterization. We will hold his diuretic before the procedure. He has diet-controlled diabetes mellitus and borderline renal function. Will insure preprocedural hydration and limit the total amount of contrast used to the strict necessary amount.     Junious Silk, MD, [facility] [phone] office [phone]  pager

## 2013-01-07 NOTE — Assessment & Plan Note (Signed)
Recommend coronary angiography/cardiac catheterization. We discussed the risks and benefits of diagnostic procedure as well as possible percutaneous angioplasty and stenting. He has several pertinent questions and wishes to proceed with cardiac catheterization. We will hold his diuretic before the procedure. He has diet-controlled diabetes mellitus and borderline renal function. Will insure preprocedural hydration and limit the total amount of contrast used to the strict necessary amount.

## 2013-01-08 ENCOUNTER — Encounter (HOSPITAL_COMMUNITY): Payer: Self-pay | Admitting: Pharmacy Technician

## 2013-01-08 ENCOUNTER — Other Ambulatory Visit: Payer: Self-pay | Admitting: *Deleted

## 2013-01-08 DIAGNOSIS — Z01810 Encounter for preprocedural cardiovascular examination: Secondary | ICD-10-CM

## 2013-01-10 ENCOUNTER — Encounter: Payer: Self-pay | Admitting: *Deleted

## 2013-01-11 ENCOUNTER — Encounter (HOSPITAL_COMMUNITY)
Admission: RE | Disposition: A | Discharge status: Home / Self Care | Payer: Self-pay | Source: Ambulatory Visit | Attending: Cardiovascular Disease

## 2013-01-11 ENCOUNTER — Encounter (HOSPITAL_COMMUNITY): Payer: Medicare Other

## 2013-01-11 ENCOUNTER — Ambulatory Visit (HOSPITAL_COMMUNITY)
Admission: RE | Admit: 2013-01-11 | Discharge: 2013-01-11 | Disposition: A | Discharge status: Home / Self Care | Payer: Medicare Other | Source: Ambulatory Visit | Attending: Cardiovascular Disease | Admitting: Cardiovascular Disease

## 2013-01-11 DIAGNOSIS — H409 Unspecified glaucoma: Secondary | ICD-10-CM | POA: Insufficient documentation

## 2013-01-11 DIAGNOSIS — Z7982 Long term (current) use of aspirin: Secondary | ICD-10-CM | POA: Insufficient documentation

## 2013-01-11 DIAGNOSIS — I209 Angina pectoris, unspecified: Secondary | ICD-10-CM | POA: Insufficient documentation

## 2013-01-11 DIAGNOSIS — Z79899 Other long term (current) drug therapy: Secondary | ICD-10-CM | POA: Insufficient documentation

## 2013-01-11 DIAGNOSIS — I2510 Atherosclerotic heart disease of native coronary artery without angina pectoris: Secondary | ICD-10-CM | POA: Insufficient documentation

## 2013-01-11 DIAGNOSIS — Z888 Allergy status to other drugs, medicaments and biological substances status: Secondary | ICD-10-CM | POA: Insufficient documentation

## 2013-01-11 DIAGNOSIS — E119 Type 2 diabetes mellitus without complications: Secondary | ICD-10-CM | POA: Insufficient documentation

## 2013-01-11 DIAGNOSIS — I2089 Other forms of angina pectoris: Secondary | ICD-10-CM

## 2013-01-11 DIAGNOSIS — E785 Hyperlipidemia, unspecified: Secondary | ICD-10-CM | POA: Insufficient documentation

## 2013-01-11 DIAGNOSIS — Z87891 Personal history of nicotine dependence: Secondary | ICD-10-CM | POA: Insufficient documentation

## 2013-01-11 DIAGNOSIS — Z8673 Personal history of transient ischemic attack (TIA), and cerebral infarction without residual deficits: Secondary | ICD-10-CM | POA: Insufficient documentation

## 2013-01-11 DIAGNOSIS — Z01810 Encounter for preprocedural cardiovascular examination: Secondary | ICD-10-CM

## 2013-01-11 DIAGNOSIS — I208 Other forms of angina pectoris: Secondary | ICD-10-CM

## 2013-01-11 DIAGNOSIS — N289 Disorder of kidney and ureter, unspecified: Secondary | ICD-10-CM | POA: Insufficient documentation

## 2013-01-11 DIAGNOSIS — R931 Abnormal findings on diagnostic imaging of heart and coronary circulation: Secondary | ICD-10-CM

## 2013-01-11 HISTORY — PX: LEFT HEART CATHETERIZATION WITH CORONARY ANGIOGRAM: SHX5451

## 2013-01-11 LAB — GLUCOSE, CAPILLARY
Glucose-Capillary: 91 mg/dL (ref 70–99)
Glucose-Capillary: 87 mg/dL (ref 70–99)

## 2013-01-11 SURGERY — LEFT HEART CATHETERIZATION WITH CORONARY ANGIOGRAM
Anesthesia: LOCAL

## 2013-01-11 MED ORDER — SODIUM CHLORIDE 0.9 % IJ SOLN: 3.0000 mL | Freq: Two times a day (BID) | INTRAMUSCULAR | Status: DC

## 2013-01-11 MED ORDER — MIDAZOLAM HCL 2 MG/2ML IJ SOLN
INTRAMUSCULAR | Status: AC
  Filled 2013-01-11: qty 2

## 2013-01-11 MED ORDER — HEPARIN SODIUM (PORCINE) 1000 UNIT/ML IJ SOLN
INTRAMUSCULAR | Status: AC
  Filled 2013-01-11: qty 1

## 2013-01-11 MED ORDER — SODIUM CHLORIDE 0.9 % IV SOLN: 250.0000 mL | INTRAVENOUS | Status: DC | PRN

## 2013-01-11 MED ORDER — LIDOCAINE HCL (PF) 1 % IJ SOLN
INTRAMUSCULAR | Status: AC
  Filled 2013-01-11: qty 30

## 2013-01-11 MED ORDER — VERAPAMIL HCL 2.5 MG/ML IV SOLN
INTRAVENOUS | Status: AC
  Filled 2013-01-11: qty 2

## 2013-01-11 MED ORDER — HEPARIN (PORCINE) IN NACL 2-0.9 UNIT/ML-% IJ SOLN
INTRAMUSCULAR | Status: AC
  Filled 2013-01-11: qty 1000

## 2013-01-11 MED ORDER — SODIUM CHLORIDE 0.9 % IV SOLN: INTRAVENOUS | Status: DC

## 2013-01-11 MED ORDER — SODIUM CHLORIDE 0.9 % IJ SOLN: 3.0000 mL | INTRAMUSCULAR | Status: DC | PRN

## 2013-01-11 MED ORDER — FENTANYL CITRATE 0.05 MG/ML IJ SOLN
INTRAMUSCULAR | Status: AC
  Filled 2013-01-11: qty 2

## 2013-01-11 MED ORDER — ONDANSETRON HCL 4 MG/2ML IJ SOLN: 4.0000 mg | Freq: Four times a day (QID) | INTRAMUSCULAR | Status: DC | PRN

## 2013-01-11 MED ORDER — ACETAMINOPHEN 325 MG PO TABS: 650.0000 mg | ORAL_TABLET | ORAL | Status: DC | PRN

## 2013-01-11 MED ORDER — SODIUM CHLORIDE 0.9 % IV SOLN
INTRAVENOUS | Status: DC
  Administered 2013-01-11: 14:00:00 via INTRAVENOUS

## 2013-01-11 NOTE — H&P (Signed)
Date of Initial H&P: 01/07/13  History reviewed, patient examined, no change in status, stable for surgery. 78 year old with exertional angina and inferior wall ischemia on nuclear stress test. Here for diagnostic heart catheterization and possible angioplasty/stent. This procedure has been fully reviewed with the patient and written informed consent has been obtained.  Thurmon Fair, MD, [name] [phone] office [phone] pager

## 2013-01-11 NOTE — Op Note (Signed)
CARDIAC CATHETERIZATION REPORT   Procedures performed:  1. Left heart catheterization  2. Selective coronary angiography  3. Left ventriculography   Reason for procedure:  Stable angina pectoris Abnormal nuclear stress test/stress echo  Procedure performed by: Thurmon Fair, MD, [facility]  Complications: none   Estimated blood loss: less than 5 mL   History:  78 year old man with complaints typical for exertional angina pectoris with a slow pattern of worsening. Nuclear stress test showed evidence of ischemia. He is here for diagnostic left heart catheterization and possible percutaneous revascularization if appropriate.  Consent: The risks, benefits, and details of the procedure were explained to the patient. Risks including death, MI, stroke, bleeding, limb ischemia, renal failure and allergy were described and accepted by the patient. Informed written consent was obtained prior to proceeding.  Technique: The patient was brought to the cardiac catheterization laboratory in the fasting state. He was prepped and draped in the usual sterile fashion. Local anesthesia with 1% lidocaine was administered to the right wrist area. Using the modified Seldinger technique a 5 French right radial artery sheath was introduced without difficulty. Under fluoroscopic guidance, using 5 [place] JL4, JR and angled pigtail catheters, selective cannulation of the left coronary artery, right coronary artery and left ventricle were respectively performed. Several coronary angiograms in a variety of projections were recorded, as well as a left ventriculogram in the RAO projection. Left ventricular pressure and a pull back to the aorta were recorded. No immediate complications occurred. At the end of the procedure, all catheters were removed. After the procedure, hemostasis will be achieved with manual pressure.  Contrast used: 65 mL Omnipaque  Angiographic Findings:  1. The left main coronary artery exhibited  significant atherosclerosis and calcification, including a 50% stenosis in its midportion. It bifurcates in the usual fashion into the left anterior descending artery and left circumflex coronary artery.  2. The left anterior descending artery is a medium size vessel that barely reaches the apex and generates 3 major diagonal branches. The first diagonal branch is very proximal, very large and operates as a ramus intermedius type vessel.  There is evidence of mild luminal irregularities and mild calcification in the LAD artery. The maximum diameter stenosis is probably 40%, no hemodynamically meaningful stenoses are seen. The large first diagonal artery has a smooth 60-70% proximal stenosis 3. The left circumflex coronary artery is a relatively small-size vessel non- dominant vessel that generates only one major oblique marginal artery. There is evidence of moderate luminal irregularities and moderate calcification. There are 2 sequential hemodynamically meaningful stenoses in the proximal third of the oblique marginal artery, which is diffusely diseased. The maximum diameter stenosis is approximately 70%. 4. The right coronary artery is a very large-size dominant vessel that generates a branching posterior lateral ventricular system as well as a very long posterior descending artery that supplies the left ventricular apex. There is evidence of extensive luminal irregularities and moderate calcification. Multiple hemodynamically meaningful stenoses are seen. There is a 85-90% stenosis in the mid third of the right coronary artery. There is a 85-90% stenosis in the proximal posterior descending artery, that appears irregular and may be ulcerated. There is a 60-70% stenosis in the mid posterior descending artery. The posterior lateral ventricular branch is relatively free of disease. 5. The left ventricle is normal in size. The left ventricle systolic function is normal to hyperdynamic with an estimated ejection  fraction of 65-70%. Regional wall motion abnormalities are not seen. No left ventricular thrombus is seen. There  is no mitral insufficiency. The ascending aorta appears normal. There is no aortic valve stenosis by pullback. The left ventricular end-diastolic pressure is 14 mm Hg.    IMPRESSIONS:  3 vessel coronary artery disease with a 50% stenosis in the left main coronary artery and a high-grade ulcerated stenosis in the mid right coronary artery as well as lesions in secondary branches.  The patient has preserved left extrasystolic function, but has diabetes mellitus. The patient is symptomatic (exertional angina). RECOMMENDATION:  Coronary artery bypass surgery: Suggest LIMA to LAD, SVG to ramus intermedius, SVG to OM, SVG to posterior descending artery, SVG to posterolateral ventricular artery.

## 2013-01-16 ENCOUNTER — Encounter: Payer: Medicare Other | Admitting: Thoracic Surgery (Cardiothoracic Vascular Surgery)

## 2013-01-16 ENCOUNTER — Encounter: Payer: Self-pay | Admitting: Thoracic Surgery (Cardiothoracic Vascular Surgery)

## 2013-01-16 ENCOUNTER — Institutional Professional Consult (permissible substitution) (INDEPENDENT_AMBULATORY_CARE_PROVIDER_SITE_OTHER): Payer: Medicare Other | Admitting: Thoracic Surgery (Cardiothoracic Vascular Surgery)

## 2013-01-16 ENCOUNTER — Other Ambulatory Visit: Payer: Self-pay | Admitting: *Deleted

## 2013-01-16 VITALS — BP 144/82 | HR 92 | Resp 20 | Ht 71.0 in | Wt 180.0 lb

## 2013-01-16 DIAGNOSIS — I2510 Atherosclerotic heart disease of native coronary artery without angina pectoris: Secondary | ICD-10-CM

## 2013-01-16 NOTE — Progress Notes (Signed)
PCP is [name], MD Referring Provider is Croitoru, [name], MD  Chief Complaint  Patient presents with  . Coronary Artery Disease    surgical eval for severe CAD, cardic cath 01/11/13    HPI: 78 year old male with multiple cardiac risk factors presents with chief complaint of chest pain with exertion.  Charles [name] is a 78 year old gentleman with a history of hypertension, hyperlipidemia, and type 2 diabetes (diet controlled). He first noted tightness and pressure in his chest with exertion back in January. He had been walking about 4 miles a day. He began to have chest pressure towards the end of his walks. Over time that progressed to earlier and earlier in his walking. He attributed this to bronchitis and the cold weather and just cut back back on his exercise. A few weeks ago he tried to resume his walking, however, he began experiencing chest tightness and pressure once again. He saw Dr. Drue Novel who thought this was angina. A stress test was performed and showed evidence of inferior ischemia. He then underwent cardiac catheterization by Dr. Royann Shivers and was found to have left main and three-vessel coronary disease. He has well-preserved ventricular function with ejection fraction 65%. He has not had any rest or nocturnal symptoms.   Past Medical History  Diagnosis Date  . Hypertension   . Hyperlipidemia     diet controlled, statins and zetia intolerant  . Diabetes mellitus   . Retinopathy     mild glaucomea  . Transient ischemic attack 1999  . Histoplasmosis   . Chronic cough 08/2009    -allergy profile- june 2,2011- neg. -Max GERD rx/ off oils January 07 2010 x 3 weeks only but no benefit. - Sinus CT rec July 11,2011- refused due to claustrophobia -MCT neg for asthma 03/01/10 -Add 1st Gen H1 July 11,2011- better  . Macular degeneration     Past Surgical History  Procedure Laterality Date  . Cervical spine surgery  2002    plate  . Cholecystectomy  1985    Family History  Problem Relation  Age of Onset  . Coronary artery disease Neg Hx   . Stroke Neg Hx   . Colon cancer Neg Hx   . Prostate cancer Neg Hx   . Atopy Neg Hx   . Asthma Neg Hx   . Hypertension Mother   . Hypertension Father   . Diabetes Father   . Cancer Father     colon  . Hypertension Brother   . Cancer Brother     lung  . Hypertension Sister   . Cancer Sister     lung  . Diabetes Brother   . Diabetes Sister   . Diabetes      Grandparents   . Lung cancer Brother   . Lung cancer Sister     Social History History  Substance Use Topics  . Smoking status: Former Games developer  . Smokeless tobacco: Not on file     Comment: Stopped at age 76   . Alcohol Use: Yes     Comment: seldom    Current Outpatient Prescriptions  Medication Sig Dispense Refill  . aspirin EC 81 MG tablet Take 81 mg by mouth daily.      . Cholecalciferol (VITAMIN D3) 2000 UNITS TABS Take 2,000 Units by mouth daily.      . finasteride (PROSCAR) 5 MG tablet Take 5 mg by mouth daily.      Marland Kitchen FREESTYLE LITE test strip USE AS DIRECTED THREE TIMES DAILY  100  each  6  . Glucose Blood (FREESTYLE LITE TEST VI) by In Vitro route.        Marland Kitchen KRILL OIL 1000 MG CAPS Take 1,000 mg by mouth daily.       . Lancets (FREESTYLE) lancets 1 each by Other route as needed. Use as instructed       . nitroGLYCERIN (NITROSTAT) 0.4 MG SL tablet Place 0.4 mg under the tongue every 5 (five) minutes as needed for chest pain.      Marland Kitchen OVER THE COUNTER MEDICATION Take 1 tablet by mouth daily. Macular degeneration vitamin.      Marland Kitchen triamterene-hydrochlorothiazide (DYAZIDE) 37.5-25 MG per capsule Take 1 capsule by mouth daily.       No current facility-administered medications for this visit.    Allergies  Allergen Reactions  . Atorvastatin     REACTION: myalgia  . Ezetimibe     REACTION: dizzy  . Lovastatin     REACTION: weakness    Review of Systems  Constitutional: Positive for activity change, fatigue and unexpected weight change (weight gain).  HENT:  Positive for hearing loss.   Eyes:       Floaters  Respiratory: Positive for cough (non productive) and shortness of breath.   Cardiovascular: Positive for chest pain.  Genitourinary:       BPH- symptoms OK on meds  Musculoskeletal:       Leg cramps  Neurological:       TIA after cervical spine surgery 1999  All other systems reviewed and are negative.    BP 144/82  Pulse 92  Resp 20  Ht 5\' 11"  (1.803 m)  Wt 180 lb (81.647 kg)  BMI 25.12 kg/m2  SpO2 98% Physical Exam  Vitals reviewed. Constitutional: He is oriented to person, place, and time. He appears well-developed and well-nourished. No distress.  HENT:  Head: Normocephalic and atraumatic.  Eyes: EOM are normal. Pupils are equal, round, and reactive to light.  Neck: No tracheal deviation present. No thyromegaly present.  Limited flexion, no bruits  Cardiovascular: Normal rate, regular rhythm, normal heart sounds and intact distal pulses.  Exam reveals no gallop and no friction rub.   No murmur heard. Pulmonary/Chest: Effort normal and breath sounds normal. He has no wheezes. He has no rales.  Abdominal: Soft. There is no tenderness.  Well healed surgical scar  Musculoskeletal: Normal range of motion. He exhibits no edema.  Lymphadenopathy:    He has no cervical adenopathy.  Neurological: He is alert and oriented to person, place, and time. No cranial nerve deficit.  Skin: Skin is warm and dry.  Psychiatric: He has a normal mood and affect.     Diagnostic Tests: Cardiac catheterization CARDIAC CATHETERIZATION REPORT   Procedures performed:  1. Left heart catheterization  2. Selective coronary angiography  3. Left ventriculography  Reason for procedure:  Stable angina pectoris  Abnormal nuclear stress test/stress echo  Procedure performed by: Thurmon Fair, MD, [facility]  Complications: none  Estimated blood loss: less than 5 mL  History: 78 year old man with complaints typical for exertional angina pectoris  with a slow pattern of worsening. Nuclear stress test showed evidence of ischemia. He is here for diagnostic left heart catheterization and possible percutaneous revascularization if appropriate.  Consent: The risks, benefits, and details of the procedure were explained to the patient. Risks including death, MI, stroke, bleeding, limb ischemia, renal failure and allergy were described and accepted by the patient. Informed written consent was obtained prior to proceeding.  Technique: The patient was brought to the cardiac catheterization laboratory in the fasting state. He was prepped and draped in the usual sterile fashion. Local anesthesia with 1% lidocaine was administered to the right wrist area. Using the modified Seldinger technique a 5 French right radial artery sheath was introduced without difficulty. Under fluoroscopic guidance, using 5 [place] JL4, JR and angled pigtail catheters, selective cannulation of the left coronary artery, right coronary artery and left ventricle were respectively performed. Several coronary angiograms in a variety of projections were recorded, as well as a left ventriculogram in the RAO projection. Left ventricular pressure and a pull back to the aorta were recorded. No immediate complications occurred. At the end of the procedure, all catheters were removed. After the procedure, hemostasis will be achieved with manual pressure.  Contrast used: 65 mL Omnipaque  Angiographic Findings:  1. The left main coronary artery exhibited significant atherosclerosis and calcification, including a 50% stenosis in its midportion. It bifurcates in the usual fashion into the left anterior descending artery and left circumflex coronary artery.  2. The left anterior descending artery is a medium size vessel that barely reaches the apex and generates 3 major diagonal branches. The first diagonal branch is very proximal, very large and operates as a ramus intermedius type vessel.  There is  evidence of mild luminal irregularities and mild calcification in the LAD artery. The maximum diameter stenosis is probably 40%, no hemodynamically meaningful stenoses are seen.  The large first diagonal artery has a smooth 60-70% proximal stenosis  3. The left circumflex coronary artery is a relatively small-size vessel non- dominant vessel that generates only one major oblique marginal artery. There is evidence of moderate luminal irregularities and moderate calcification. There are 2 sequential hemodynamically meaningful stenoses in the proximal third of the oblique marginal artery, which is diffusely diseased. The maximum diameter stenosis is approximately 70%.  4. The right coronary artery is a very large-size dominant vessel that generates a branching posterior lateral ventricular system as well as a very long posterior descending artery that supplies the left ventricular apex. There is evidence of extensive luminal irregularities and moderate calcification. Multiple hemodynamically meaningful stenoses are seen. There is a 85-90% stenosis in the mid third of the right coronary artery. There is a 85-90% stenosis in the proximal posterior descending artery, that appears irregular and may be ulcerated. There is a 60-70% stenosis in the mid posterior descending artery. The posterior lateral ventricular branch is relatively free of disease.  5. The left ventricle is normal in size. The left ventricle systolic function is normal to hyperdynamic with an estimated ejection fraction of 65-70%. Regional wall motion abnormalities are not seen. No left ventricular thrombus is seen. There is no mitral insufficiency. The ascending aorta appears normal. There is no aortic valve stenosis by pullback. The left ventricular end-diastolic pressure is 14 mm Hg.   IMPRESSIONS:  3 vessel coronary artery disease with a 50% stenosis in the left main coronary artery and a high-grade ulcerated stenosis in the mid right coronary  artery as well as lesions in secondary branches.  The patient has preserved left extrasystolic function, but has diabetes mellitus.  The patient is symptomatic (exertional angina).  RECOMMENDATION:  Coronary artery bypass surgery: Suggest LIMA to LAD, SVG to ramus intermedius, SVG to OM, SVG to posterior descending artery, SVG to posterolateral ventricular artery.        Impression: Mr. Cumming is a 78 year old gentleman with multiple cardiac risk factors but no prior history of  coronary disease. He has a six-month history of exertional angina. He has had recent progression of his symptoms. A stress test showed inferior ischemia. Cardiac catheterization revealed left main and three-vessel coronary disease with preserved left ventricular function. Coronary bypass grafting is indicated for survival benefit and relief of symptoms.  I discussed with the patient and his wife the general nature of the procedure, need for general anesthesia, and the incisions to be used. I discussed the expected hospital stay, overall recovery and short and long term outcomes. We discussed the indications, risks, benefits, and alternatives. They understand the risks include but are not limited to death, stroke, MI, DVT/PE, bleeding, possible need for transfusion, infections, cardiac arrhythmias, and other organ system dysfunction including respiratory, renal, or GI complications. They understand and accept these risks and agree to proceed.   Plan: Coronary bypass grafting on Monday, June 16. He will be admitted on the day of procedure.

## 2013-01-17 ENCOUNTER — Encounter (HOSPITAL_COMMUNITY): Payer: Self-pay | Admitting: Pharmacy Technician

## 2013-01-17 ENCOUNTER — Encounter (HOSPITAL_COMMUNITY)
Admission: RE | Admit: 2013-01-17 | Discharge: 2013-01-17 | Disposition: A | Discharge status: Home / Self Care | Payer: Medicare Other | Source: Ambulatory Visit | Attending: Thoracic Surgery (Cardiothoracic Vascular Surgery) | Admitting: Thoracic Surgery (Cardiothoracic Vascular Surgery)

## 2013-01-17 ENCOUNTER — Encounter (HOSPITAL_COMMUNITY): Payer: Self-pay

## 2013-01-17 ENCOUNTER — Ambulatory Visit (HOSPITAL_COMMUNITY)
Admission: RE | Admit: 2013-01-17 | Discharge: 2013-01-17 | Disposition: A | Discharge status: Home / Self Care | Payer: Medicare Other | Source: Ambulatory Visit | Attending: Thoracic Surgery (Cardiothoracic Vascular Surgery) | Admitting: Thoracic Surgery (Cardiothoracic Vascular Surgery)

## 2013-01-17 VITALS — BP 164/75 | HR 72 | Temp 97.8°F | Resp 20 | Ht 71.5 in | Wt 179.1 lb

## 2013-01-17 DIAGNOSIS — Z01810 Encounter for preprocedural cardiovascular examination: Secondary | ICD-10-CM | POA: Insufficient documentation

## 2013-01-17 DIAGNOSIS — Z01811 Encounter for preprocedural respiratory examination: Secondary | ICD-10-CM | POA: Insufficient documentation

## 2013-01-17 DIAGNOSIS — R059 Cough, unspecified: Secondary | ICD-10-CM | POA: Insufficient documentation

## 2013-01-17 DIAGNOSIS — R0609 Other forms of dyspnea: Secondary | ICD-10-CM | POA: Insufficient documentation

## 2013-01-17 DIAGNOSIS — I2510 Atherosclerotic heart disease of native coronary artery without angina pectoris: Secondary | ICD-10-CM | POA: Insufficient documentation

## 2013-01-17 DIAGNOSIS — R0989 Other specified symptoms and signs involving the circulatory and respiratory systems: Secondary | ICD-10-CM | POA: Insufficient documentation

## 2013-01-17 DIAGNOSIS — Z01812 Encounter for preprocedural laboratory examination: Secondary | ICD-10-CM | POA: Insufficient documentation

## 2013-01-17 DIAGNOSIS — R05 Cough: Secondary | ICD-10-CM | POA: Insufficient documentation

## 2013-01-17 DIAGNOSIS — Z01818 Encounter for other preprocedural examination: Secondary | ICD-10-CM | POA: Insufficient documentation

## 2013-01-17 HISTORY — DX: Other specified postprocedural states: Z98.890

## 2013-01-17 HISTORY — DX: Cerebral infarction, unspecified: I63.9

## 2013-01-17 HISTORY — DX: Atherosclerotic heart disease of native coronary artery without angina pectoris: I25.10

## 2013-01-17 HISTORY — DX: Other specified postprocedural states: R11.2

## 2013-01-17 LAB — URINALYSIS, ROUTINE W REFLEX MICROSCOPIC
Ketones, ur: NEGATIVE mg/dL
Leukocytes, UA: NEGATIVE
Nitrite: NEGATIVE
Specific Gravity, Urine: 1.019 (ref 1.005–1.030)
Urobilinogen, UA: 0.2 mg/dL (ref 0.0–1.0)
pH: 6.5 (ref 5.0–8.0)

## 2013-01-17 LAB — COMPREHENSIVE METABOLIC PANEL
ALT: 31 U/L (ref 0–53)
Alkaline Phosphatase: 63 U/L (ref 39–117)
CO2: 23 mEq/L (ref 19–32)
Chloride: 102 mEq/L (ref 96–112)
GFR calc Af Amer: 53 mL/min — ABNORMAL LOW (ref >90)
GFR calc non Af Amer: 46 mL/min — ABNORMAL LOW (ref >90)
Glucose, Bld: 96 mg/dL (ref 70–99)
Potassium: 4 mEq/L (ref 3.5–5.1)
Sodium: 138 mEq/L (ref 135–145)
Total Bilirubin: 0.6 mg/dL (ref 0.3–1.2)

## 2013-01-17 LAB — SURGICAL PCR SCREEN: Staphylococcus aureus: POSITIVE — AB

## 2013-01-17 LAB — BLOOD GAS, ARTERIAL
Bicarbonate: 25.8 mEq/L — ABNORMAL HIGH (ref 20.0–24.0)
O2 Saturation: 98.9 %
pO2, Arterial: 98 mmHg (ref 80.0–100.0)

## 2013-01-17 LAB — CBC
Hemoglobin: 15.3 g/dL (ref 13.0–17.0)
MCH: 30.9 pg (ref 26.0–34.0)
RBC: 4.95 MIL/uL (ref 4.22–5.81)
WBC: 8.4 10*3/uL (ref 4.0–10.5)

## 2013-01-17 LAB — TYPE AND SCREEN: Antibody Screen: NEGATIVE

## 2013-01-17 LAB — PULMONARY FUNCTION TEST

## 2013-01-17 NOTE — Pre-Procedure Instructions (Addendum)
Charles [name]  01/17/2013   Your procedure is scheduled on:  6.16.14  Report to [facility] at 1000 AM.  Call this number if you have problems the morning of surgery: [phone]   Remember:   Do not eat food or drink liquids after midnight.   Take these medicines the morning of surgery with A SIP OF WATER: proscar STOP ibuprofen, krill oil now   Do not wear jewelry, make-up or nail polish.  Do not wear lotions, powders, or perfumes. You may wear deodorant.  Do not shave 48 hours prior to surgery. Men may shave face and neck.  Do not bring valuables to the hospital.  [facility] is not responsible                   for any belongings or valuables.  Contacts, dentures or bridgework may not be worn into surgery.  Leave suitcase in the car. After surgery it may be brought to your room.  For patients admitted to the hospital, checkout time is 11:00 AM the day of  discharge.   Patients discharged the day of surgery will not be allowed to drive  home.  Name and phone number of your driver:   Special Instructions: Incentive Spirometry - Practice and bring it with you on the day of surgery. Shower using CHG 2 nights before surgery and the night before surgery.  If you shower the day of surgery use CHG.  Use special wash - you have one bottle of CHG for all showers.  You should use approximately 1/3 of the bottle for each shower.   Please read over the following fact sheets that you were given: Pain Booklet, Coughing and Deep Breathing, Blood Transfusion Information, Open Heart Packet, MRSA Information and Surgical Site Infection Prevention

## 2013-01-17 NOTE — Progress Notes (Signed)
VASCULAR LAB PRELIMINARY  PRELIMINARY  PRELIMINARY  PRELIMINARY  Pre-op Cardiac Surgery  Carotid Findings:  Bilateral:  Less than 39% ICA stenosis.  Vertebral artery flow is antegrade.      Upper Extremity Right Left  Brachial Pressures 155  Triphasic  163  Triphasic   Radial Waveforms Triphasic  Triphasic   Ulnar Waveforms Triphasic  Triphasic   Palmar Arch (Allen's Test) Within normal limits  Within normal limits     Lower  Extremity Right Left  Dorsalis Pedis 164  Biphasic  182 Biphasic   Anterior Tibial    Posterior Tibial 164 Monophasic  178 Biphasic   Ankle/Brachial Indices 1.01 Within normal limits  1.12  Within normal limits     Brycelynn Stampley, RVT 01/17/2013, 2:08 PM

## 2013-01-17 NOTE — Progress Notes (Signed)
Cath 5/14 doppler,pft's done today. pft's not resulted yet

## 2013-01-20 MED ORDER — DEXTROSE 5 % IV SOLN
750.0000 mg | INTRAVENOUS | Status: DC
  Filled 2013-01-20: qty 750

## 2013-01-20 MED ORDER — VANCOMYCIN HCL 10 G IV SOLR
1250.0000 mg | INTRAVENOUS | Status: AC
  Administered 2013-01-21: 1250 mg via INTRAVENOUS
  Filled 2013-01-20 (×2): qty 1250

## 2013-01-20 MED ORDER — EPINEPHRINE HCL 1 MG/ML IJ SOLN
0.5000 ug/min | INTRAVENOUS | Status: DC
  Filled 2013-01-20: qty 4

## 2013-01-20 MED ORDER — PLASMA-LYTE 148 IV SOLN
INTRAVENOUS | Status: AC
  Administered 2013-01-21: 13:00:00
  Filled 2013-01-20: qty 2.5

## 2013-01-20 MED ORDER — MAGNESIUM SULFATE 50 % IJ SOLN
40.0000 meq | INTRAMUSCULAR | Status: DC
  Filled 2013-01-20: qty 10

## 2013-01-20 MED ORDER — POTASSIUM CHLORIDE 2 MEQ/ML IV SOLN
80.0000 meq | INTRAVENOUS | Status: DC
  Filled 2013-01-20: qty 40

## 2013-01-20 MED ORDER — DEXTROSE 5 % IV SOLN
1.5000 g | INTRAVENOUS | Status: AC
  Administered 2013-01-21: .75 g via INTRAVENOUS
  Administered 2013-01-21: 1.5 g via INTRAVENOUS
  Filled 2013-01-20 (×2): qty 1.5

## 2013-01-20 MED ORDER — SODIUM CHLORIDE 0.9 % IV SOLN
INTRAVENOUS | Status: DC
  Filled 2013-01-20: qty 30

## 2013-01-20 MED ORDER — SODIUM CHLORIDE 0.9 % IV SOLN
INTRAVENOUS | Status: AC
  Administered 2013-01-21: 69.8 mL/h via INTRAVENOUS
  Filled 2013-01-20: qty 40

## 2013-01-20 MED ORDER — SODIUM CHLORIDE 0.9 % IV SOLN
INTRAVENOUS | Status: AC
  Administered 2013-01-21: 1 [IU]/h via INTRAVENOUS
  Filled 2013-01-20: qty 1

## 2013-01-20 MED ORDER — NITROGLYCERIN IN D5W 200-5 MCG/ML-% IV SOLN
2.0000 ug/min | INTRAVENOUS | Status: AC
  Administered 2013-01-21: 16.67 ug/min via INTRAVENOUS
  Filled 2013-01-20: qty 250

## 2013-01-20 MED ORDER — DEXMEDETOMIDINE HCL IN NACL 400 MCG/100ML IV SOLN
0.1000 ug/kg/h | INTRAVENOUS | Status: AC
  Administered 2013-01-21: 0.2 ug/kg/h via INTRAVENOUS
  Filled 2013-01-20: qty 100

## 2013-01-20 MED ORDER — DOPAMINE-DEXTROSE 3.2-5 MG/ML-% IV SOLN
2.0000 ug/kg/min | INTRAVENOUS | Status: DC
  Filled 2013-01-20: qty 250

## 2013-01-20 MED ORDER — PHENYLEPHRINE HCL 10 MG/ML IJ SOLN
30.0000 ug/min | INTRAVENOUS | Status: AC
  Administered 2013-01-21: 25 ug/min via INTRAVENOUS
  Filled 2013-01-20: qty 2

## 2013-01-21 ENCOUNTER — Inpatient Hospital Stay (HOSPITAL_COMMUNITY): Payer: Medicare Other

## 2013-01-21 ENCOUNTER — Inpatient Hospital Stay (HOSPITAL_COMMUNITY)
Admission: RE | Admit: 2013-01-21 | Discharge: 2013-01-25 | DRG: 236 | Disposition: A | Discharge status: Home / Self Care | Payer: Medicare Other | Source: Ambulatory Visit | Attending: Thoracic Surgery (Cardiothoracic Vascular Surgery) | Admitting: Thoracic Surgery (Cardiothoracic Vascular Surgery)

## 2013-01-21 ENCOUNTER — Encounter (HOSPITAL_COMMUNITY): Payer: Self-pay | Admitting: Anesthesiology

## 2013-01-21 ENCOUNTER — Encounter (HOSPITAL_COMMUNITY)
Admission: RE | Disposition: A | Discharge status: Home / Self Care | Payer: Self-pay | Source: Ambulatory Visit | Attending: Thoracic Surgery (Cardiothoracic Vascular Surgery)

## 2013-01-21 ENCOUNTER — Encounter: Payer: Self-pay | Admitting: Internal Medicine

## 2013-01-21 ENCOUNTER — Inpatient Hospital Stay (HOSPITAL_COMMUNITY): Payer: Medicare Other | Admitting: Anesthesiology

## 2013-01-21 DIAGNOSIS — Z87891 Personal history of nicotine dependence: Secondary | ICD-10-CM

## 2013-01-21 DIAGNOSIS — I25810 Atherosclerosis of coronary artery bypass graft(s) without angina pectoris: Secondary | ICD-10-CM

## 2013-01-21 DIAGNOSIS — I10 Essential (primary) hypertension: Secondary | ICD-10-CM | POA: Diagnosis present

## 2013-01-21 DIAGNOSIS — J9819 Other pulmonary collapse: Secondary | ICD-10-CM | POA: Diagnosis not present

## 2013-01-21 DIAGNOSIS — I2510 Atherosclerotic heart disease of native coronary artery without angina pectoris: Principal | ICD-10-CM | POA: Diagnosis present

## 2013-01-21 DIAGNOSIS — E785 Hyperlipidemia, unspecified: Secondary | ICD-10-CM | POA: Diagnosis present

## 2013-01-21 DIAGNOSIS — I493 Ventricular premature depolarization: Secondary | ICD-10-CM

## 2013-01-21 DIAGNOSIS — H3530 Unspecified macular degeneration: Secondary | ICD-10-CM | POA: Diagnosis present

## 2013-01-21 DIAGNOSIS — I472 Ventricular tachycardia, unspecified: Secondary | ICD-10-CM | POA: Diagnosis not present

## 2013-01-21 DIAGNOSIS — I4729 Other ventricular tachycardia: Secondary | ICD-10-CM | POA: Diagnosis not present

## 2013-01-21 DIAGNOSIS — R609 Edema, unspecified: Secondary | ICD-10-CM

## 2013-01-21 DIAGNOSIS — Z8673 Personal history of transient ischemic attack (TIA), and cerebral infarction without residual deficits: Secondary | ICD-10-CM

## 2013-01-21 DIAGNOSIS — H409 Unspecified glaucoma: Secondary | ICD-10-CM | POA: Diagnosis present

## 2013-01-21 DIAGNOSIS — E119 Type 2 diabetes mellitus without complications: Secondary | ICD-10-CM | POA: Diagnosis present

## 2013-01-21 DIAGNOSIS — D62 Acute posthemorrhagic anemia: Secondary | ICD-10-CM | POA: Diagnosis not present

## 2013-01-21 DIAGNOSIS — I209 Angina pectoris, unspecified: Secondary | ICD-10-CM | POA: Diagnosis present

## 2013-01-21 DIAGNOSIS — D696 Thrombocytopenia, unspecified: Secondary | ICD-10-CM | POA: Diagnosis not present

## 2013-01-21 HISTORY — PX: CORONARY ARTERY BYPASS GRAFT: SHX141

## 2013-01-21 LAB — POCT I-STAT, CHEM 8
Chloride: 108 mEq/L (ref 96–112)
Creatinine, Ser: 1.3 mg/dL (ref 0.50–1.35)
HCT: 28 % — ABNORMAL LOW (ref 39.0–52.0)
Hemoglobin: 9.5 g/dL — ABNORMAL LOW (ref 13.0–17.0)
Potassium: 4.4 mEq/L (ref 3.5–5.1)
Sodium: 142 mEq/L (ref 135–145)

## 2013-01-21 LAB — POCT I-STAT 4, (NA,K, GLUC, HGB,HCT)
Glucose, Bld: 99 mg/dL (ref 70–99)
Glucose, Bld: 109 mg/dL — ABNORMAL HIGH (ref 70–99)
Glucose, Bld: 141 mg/dL — ABNORMAL HIGH (ref 70–99)
HCT: 40 % (ref 39.0–52.0)
HCT: 26 % — ABNORMAL LOW (ref 39.0–52.0)
HCT: 35 % — ABNORMAL LOW (ref 39.0–52.0)
HCT: 31 % — ABNORMAL LOW (ref 39.0–52.0)
HCT: 34 % — ABNORMAL LOW (ref 39.0–52.0)
Hemoglobin: 13.6 g/dL (ref 13.0–17.0)
Hemoglobin: 8.8 g/dL — ABNORMAL LOW (ref 13.0–17.0)
Hemoglobin: 9.2 g/dL — ABNORMAL LOW (ref 13.0–17.0)
Hemoglobin: 11.6 g/dL — ABNORMAL LOW (ref 13.0–17.0)
Potassium: 3.7 mEq/L (ref 3.5–5.1)
Potassium: 4.8 mEq/L (ref 3.5–5.1)
Potassium: 3.3 mEq/L — ABNORMAL LOW (ref 3.5–5.1)
Sodium: 141 mEq/L (ref 135–145)
Sodium: 138 mEq/L (ref 135–145)

## 2013-01-21 LAB — CREATININE, SERUM
Creatinine, Ser: 1.14 mg/dL (ref 0.50–1.35)
GFR calc Af Amer: 70 mL/min — ABNORMAL LOW (ref >90)
GFR calc non Af Amer: 61 mL/min — ABNORMAL LOW (ref >90)

## 2013-01-21 LAB — POCT I-STAT 3, ART BLOOD GAS (G3+)
Acid-Base Excess: 3 mmol/L — ABNORMAL HIGH (ref 0.0–2.0)
Bicarbonate: 26.6 mEq/L — ABNORMAL HIGH (ref 20.0–24.0)
Bicarbonate: 24.5 mEq/L — ABNORMAL HIGH (ref 20.0–24.0)
O2 Saturation: 100 %
O2 Saturation: 99 %
TCO2: 28 mmol/L (ref 0–100)
TCO2: 26 mmol/L (ref 0–100)
pCO2 arterial: 42.4 mmHg (ref 35.0–45.0)
pCO2 arterial: 38.8 mmHg (ref 35.0–45.0)
pH, Arterial: 7.417 (ref 7.350–7.450)
pH, Arterial: 7.405 (ref 7.350–7.450)
pO2, Arterial: 395 mmHg — ABNORMAL HIGH (ref 80.0–100.0)

## 2013-01-21 LAB — HEMOGLOBIN AND HEMATOCRIT, BLOOD
HCT: 28 % — ABNORMAL LOW (ref 39.0–52.0)
Hemoglobin: 9.7 g/dL — ABNORMAL LOW (ref 13.0–17.0)

## 2013-01-21 LAB — CBC
HCT: 36.6 % — ABNORMAL LOW (ref 39.0–52.0)
Hemoglobin: 12.6 g/dL — ABNORMAL LOW (ref 13.0–17.0)
MCHC: 34.4 g/dL (ref 30.0–36.0)
MCV: 90.4 fL (ref 78.0–100.0)
RDW: 13 % (ref 11.5–15.5)

## 2013-01-21 LAB — MAGNESIUM: Magnesium: 2.8 mg/dL — ABNORMAL HIGH (ref 1.5–2.5)

## 2013-01-21 LAB — GLUCOSE, CAPILLARY: Glucose-Capillary: 114 mg/dL — ABNORMAL HIGH (ref 70–99)

## 2013-01-21 LAB — PLATELET COUNT: Platelets: 138 10*3/uL — ABNORMAL LOW (ref 150–400)

## 2013-01-21 SURGERY — CORONARY ARTERY BYPASS GRAFTING (CABG)
Anesthesia: General | Site: Chest | Wound class: Clean

## 2013-01-21 MED ORDER — LACTATED RINGERS IV SOLN: INTRAVENOUS | Status: DC

## 2013-01-21 MED ORDER — MUPIROCIN 2 % EX OINT
1.0000 "application " | TOPICAL_OINTMENT | Freq: Two times a day (BID) | CUTANEOUS | Status: DC
  Administered 2013-01-21 – 2013-01-25 (×8): 1 via NASAL
  Filled 2013-01-21: qty 22

## 2013-01-21 MED ORDER — DEXTROSE 5 % IV SOLN
1.5000 g | Freq: Two times a day (BID) | INTRAVENOUS | Status: AC
  Administered 2013-01-21 – 2013-01-23 (×4): 1.5 g via INTRAVENOUS
  Filled 2013-01-21 (×4): qty 1.5

## 2013-01-21 MED ORDER — ASPIRIN 81 MG PO CHEW: 324.0000 mg | CHEWABLE_TABLET | Freq: Every day | ORAL | Status: DC

## 2013-01-21 MED ORDER — SODIUM CHLORIDE 0.9 % IV SOLN
INTRAVENOUS | Status: DC
  Administered 2013-01-21 (×2): via INTRAVENOUS

## 2013-01-21 MED ORDER — ACETAMINOPHEN 160 MG/5ML PO SOLN
975.0000 mg | Freq: Four times a day (QID) | ORAL | Status: DC
  Administered 2013-01-24: 975 mg
  Filled 2013-01-21: qty 40.6

## 2013-01-21 MED ORDER — LIDOCAINE HCL (CARDIAC) 20 MG/ML IV SOLN
INTRAVENOUS | Status: DC | PRN
  Administered 2013-01-21: 40 mg via INTRAVENOUS

## 2013-01-21 MED ORDER — METOPROLOL TARTRATE 12.5 MG HALF TABLET
12.5000 mg | ORAL_TABLET | Freq: Two times a day (BID) | ORAL | Status: DC
  Administered 2013-01-22 – 2013-01-23 (×3): 12.5 mg via ORAL
  Filled 2013-01-21 (×5): qty 1

## 2013-01-21 MED ORDER — DEXMEDETOMIDINE HCL IN NACL 200 MCG/50ML IV SOLN
0.1000 ug/kg/h | INTRAVENOUS | Status: DC
  Administered 2013-01-21: 0.7 ug/kg/h via INTRAVENOUS
  Filled 2013-01-21: qty 50

## 2013-01-21 MED ORDER — FENTANYL CITRATE 0.05 MG/ML IJ SOLN
INTRAMUSCULAR | Status: DC | PRN
  Administered 2013-01-21: 150 ug via INTRAVENOUS
  Administered 2013-01-21: 75 ug via INTRAVENOUS
  Administered 2013-01-21: 500 ug via INTRAVENOUS
  Administered 2013-01-21: 75 ug via INTRAVENOUS
  Administered 2013-01-21: 100 ug via INTRAVENOUS
  Administered 2013-01-21: 250 ug via INTRAVENOUS

## 2013-01-21 MED ORDER — PANTOPRAZOLE SODIUM 40 MG PO TBEC
40.0000 mg | DELAYED_RELEASE_TABLET | Freq: Every day | ORAL | Status: DC
  Administered 2013-01-23: 40 mg via ORAL
  Filled 2013-01-21: qty 1

## 2013-01-21 MED ORDER — DOCUSATE SODIUM 100 MG PO CAPS
200.0000 mg | ORAL_CAPSULE | Freq: Every day | ORAL | Status: DC
  Administered 2013-01-22 – 2013-01-25 (×4): 200 mg via ORAL
  Filled 2013-01-21 (×4): qty 2

## 2013-01-21 MED ORDER — FENTANYL CITRATE 0.05 MG/ML IJ SOLN
INTRAMUSCULAR | Status: AC
  Administered 2013-01-21: 50 ug via INTRAVENOUS
  Administered 2013-01-21: 100 ug via INTRAVENOUS
  Filled 2013-01-21: qty 2

## 2013-01-21 MED ORDER — ACETAMINOPHEN 500 MG PO TABS
1000.0000 mg | ORAL_TABLET | Freq: Four times a day (QID) | ORAL | Status: DC
  Administered 2013-01-22 – 2013-01-23 (×7): 1000 mg via ORAL
  Filled 2013-01-21 (×18): qty 2

## 2013-01-21 MED ORDER — VECURONIUM BROMIDE 10 MG IV SOLR
INTRAVENOUS | Status: DC | PRN
  Administered 2013-01-21: 8 mg via INTRAVENOUS

## 2013-01-21 MED ORDER — SODIUM CHLORIDE 0.9 % IV SOLN
INTRAVENOUS | Status: DC | PRN
  Administered 2013-01-21 (×3): via INTRAVENOUS

## 2013-01-21 MED ORDER — SODIUM CHLORIDE 0.9 % IV SOLN: 250.0000 mL | INTRAVENOUS | Status: DC

## 2013-01-21 MED ORDER — 0.9 % SODIUM CHLORIDE (POUR BTL) OPTIME
TOPICAL | Status: DC | PRN
  Administered 2013-01-21: 6000 mL

## 2013-01-21 MED ORDER — SODIUM CHLORIDE 0.9 % IJ SOLN: 3.0000 mL | INTRAMUSCULAR | Status: DC | PRN

## 2013-01-21 MED ORDER — METOCLOPRAMIDE HCL 5 MG/ML IJ SOLN
10.0000 mg | Freq: Four times a day (QID) | INTRAMUSCULAR | Status: AC
  Administered 2013-01-21 – 2013-01-22 (×4): 10 mg via INTRAVENOUS
  Filled 2013-01-21 (×4): qty 2

## 2013-01-21 MED ORDER — ROCURONIUM BROMIDE 100 MG/10ML IV SOLN
INTRAVENOUS | Status: DC | PRN
  Administered 2013-01-21: 100 mg via INTRAVENOUS

## 2013-01-21 MED ORDER — METOPROLOL TARTRATE 25 MG/10 ML ORAL SUSPENSION
12.5000 mg | Freq: Two times a day (BID) | ORAL | Status: DC
  Administered 2013-01-21: 12.5 mg
  Filled 2013-01-21 (×5): qty 5

## 2013-01-21 MED ORDER — METOPROLOL TARTRATE 12.5 MG HALF TABLET
ORAL_TABLET | ORAL | Status: AC
  Filled 2013-01-21: qty 1

## 2013-01-21 MED ORDER — ASPIRIN EC 325 MG PO TBEC
325.0000 mg | DELAYED_RELEASE_TABLET | Freq: Every day | ORAL | Status: DC
Start: 2013-01-22 — End: 2013-01-25
  Administered 2013-01-22 – 2013-01-25 (×4): 325 mg via ORAL
  Filled 2013-01-21 (×4): qty 1

## 2013-01-21 MED ORDER — HEPARIN SODIUM (PORCINE) 1000 UNIT/ML IJ SOLN
INTRAMUSCULAR | Status: DC | PRN
  Administered 2013-01-21: 2000 [IU] via INTRAVENOUS
  Administered 2013-01-21: 33000 [IU] via INTRAVENOUS

## 2013-01-21 MED ORDER — FAMOTIDINE IN NACL 20-0.9 MG/50ML-% IV SOLN
20.0000 mg | Freq: Two times a day (BID) | INTRAVENOUS | Status: DC
  Administered 2013-01-21: 20 mg via INTRAVENOUS
  Filled 2013-01-21: qty 50

## 2013-01-21 MED ORDER — MORPHINE SULFATE 2 MG/ML IJ SOLN
1.0000 mg | INTRAMUSCULAR | Status: AC | PRN
  Administered 2013-01-21: 2 mg via INTRAVENOUS
  Filled 2013-01-21: qty 2

## 2013-01-21 MED ORDER — PHENYLEPHRINE HCL 10 MG/ML IJ SOLN
0.0000 ug/min | INTRAVENOUS | Status: DC
  Filled 2013-01-21: qty 2

## 2013-01-21 MED ORDER — PROPOFOL 10 MG/ML IV BOLUS
INTRAVENOUS | Status: DC | PRN
  Administered 2013-01-21: 60 mg via INTRAVENOUS
  Administered 2013-01-21: 100 mg via INTRAVENOUS

## 2013-01-21 MED ORDER — ARTIFICIAL TEARS OP OINT
TOPICAL_OINTMENT | OPHTHALMIC | Status: DC | PRN
  Administered 2013-01-21: 1 via OPHTHALMIC

## 2013-01-21 MED ORDER — PROTAMINE SULFATE 10 MG/ML IV SOLN
INTRAVENOUS | Status: DC | PRN
  Administered 2013-01-21: 290 mg via INTRAVENOUS
  Administered 2013-01-21: 10 mg via INTRAVENOUS

## 2013-01-21 MED ORDER — ONDANSETRON HCL 4 MG/2ML IJ SOLN
4.0000 mg | Freq: Four times a day (QID) | INTRAMUSCULAR | Status: DC | PRN
  Administered 2013-01-22 (×2): 4 mg via INTRAVENOUS
  Filled 2013-01-21 (×2): qty 2

## 2013-01-21 MED ORDER — CHLORHEXIDINE GLUCONATE CLOTH 2 % EX PADS
6.0000 | MEDICATED_PAD | Freq: Every day | CUTANEOUS | Status: DC
  Administered 2013-01-21 – 2013-01-22 (×2): 6 via TOPICAL

## 2013-01-21 MED ORDER — DOPAMINE-DEXTROSE 3.2-5 MG/ML-% IV SOLN
INTRAVENOUS | Status: DC | PRN
  Administered 2013-01-21: 3 ug/kg/min via INTRAVENOUS

## 2013-01-21 MED ORDER — HEMOSTATIC AGENTS (NO CHARGE) OPTIME
TOPICAL | Status: DC | PRN
  Administered 2013-01-21: 1 via TOPICAL

## 2013-01-21 MED ORDER — MORPHINE SULFATE 2 MG/ML IJ SOLN
2.0000 mg | INTRAMUSCULAR | Status: DC | PRN
  Administered 2013-01-22 (×5): 4 mg via INTRAVENOUS
  Administered 2013-01-22: 2 mg via INTRAVENOUS
  Filled 2013-01-21 (×2): qty 1
  Filled 2013-01-21: qty 2
  Filled 2013-01-21: qty 1
  Filled 2013-01-21: qty 2
  Filled 2013-01-21: qty 1
  Filled 2013-01-21: qty 2

## 2013-01-21 MED ORDER — BISACODYL 10 MG RE SUPP: 10.0000 mg | Freq: Every day | RECTAL | Status: DC

## 2013-01-21 MED ORDER — SODIUM CHLORIDE 0.9 % IJ SOLN
3.0000 mL | Freq: Two times a day (BID) | INTRAMUSCULAR | Status: DC
  Administered 2013-01-22 (×2): 3 mL via INTRAVENOUS

## 2013-01-21 MED ORDER — VANCOMYCIN HCL IN DEXTROSE 1-5 GM/200ML-% IV SOLN
1000.0000 mg | Freq: Once | INTRAVENOUS | Status: AC
  Administered 2013-01-22: 1000 mg via INTRAVENOUS
  Filled 2013-01-21: qty 200

## 2013-01-21 MED ORDER — CHLORHEXIDINE GLUCONATE 4 % EX LIQD: 30.0000 mL | CUTANEOUS | Status: DC

## 2013-01-21 MED ORDER — POTASSIUM CHLORIDE 10 MEQ/50ML IV SOLN
10.0000 meq | INTRAVENOUS | Status: AC
  Administered 2013-01-21 (×3): 10 meq via INTRAVENOUS

## 2013-01-21 MED ORDER — NITROGLYCERIN IN D5W 200-5 MCG/ML-% IV SOLN: 0.0000 ug/min | INTRAVENOUS | Status: DC

## 2013-01-21 MED ORDER — SODIUM CHLORIDE 0.9 % IJ SOLN
OROMUCOSAL | Status: DC | PRN
  Administered 2013-01-21 (×3): via TOPICAL

## 2013-01-21 MED ORDER — ALBUMIN HUMAN 5 % IV SOLN
250.0000 mL | INTRAVENOUS | Status: AC | PRN
  Administered 2013-01-21 (×4): 250 mL via INTRAVENOUS
  Filled 2013-01-21: qty 500

## 2013-01-21 MED ORDER — METOPROLOL TARTRATE 1 MG/ML IV SOLN: 2.5000 mg | INTRAVENOUS | Status: DC | PRN

## 2013-01-21 MED ORDER — CALCIUM CHLORIDE 10 % IV SOLN
1.0000 g | Freq: Once | INTRAVENOUS | Status: AC
  Administered 2013-01-21: 1 g via INTRAVENOUS
  Filled 2013-01-21: qty 10

## 2013-01-21 MED ORDER — MAGNESIUM SULFATE 40 MG/ML IJ SOLN
4.0000 g | Freq: Once | INTRAMUSCULAR | Status: AC
  Administered 2013-01-21: 4 g via INTRAVENOUS
  Filled 2013-01-21: qty 100

## 2013-01-21 MED ORDER — INSULIN REGULAR BOLUS VIA INFUSION
0.0000 [IU] | Freq: Three times a day (TID) | INTRAVENOUS | Status: DC
Start: 2013-01-21 — End: 2013-01-23
  Filled 2013-01-21: qty 10

## 2013-01-21 MED ORDER — SODIUM CHLORIDE 0.45 % IV SOLN
INTRAVENOUS | Status: DC
  Administered 2013-01-21: 17:00:00 via INTRAVENOUS

## 2013-01-21 MED ORDER — MIDAZOLAM HCL 2 MG/2ML IJ SOLN
2.0000 mg | INTRAMUSCULAR | Status: DC | PRN
  Administered 2013-01-21 (×2): 2 mg via INTRAVENOUS
  Filled 2013-01-21 (×3): qty 2

## 2013-01-21 MED ORDER — OXYCODONE HCL 5 MG PO TABS
5.0000 mg | ORAL_TABLET | ORAL | Status: DC | PRN
  Administered 2013-01-22 – 2013-01-23 (×5): 10 mg via ORAL
  Administered 2013-01-25: 5 mg via ORAL
  Filled 2013-01-21: qty 2
  Filled 2013-01-21: qty 1
  Filled 2013-01-21 (×4): qty 2

## 2013-01-21 MED ORDER — MIDAZOLAM HCL 2 MG/2ML IJ SOLN
INTRAMUSCULAR | Status: AC
  Administered 2013-01-21: 3 mg via INTRAVENOUS
  Administered 2013-01-21 (×3): 2 mg via INTRAVENOUS
  Administered 2013-01-21: 3 mg via INTRAVENOUS
  Filled 2013-01-21: qty 2

## 2013-01-21 MED ORDER — SODIUM CHLORIDE 0.9 % IV SOLN
INTRAVENOUS | Status: DC
  Filled 2013-01-21: qty 1

## 2013-01-21 MED ORDER — ACETAMINOPHEN 10 MG/ML IV SOLN
1000.0000 mg | Freq: Once | INTRAVENOUS | Status: AC
  Administered 2013-01-21: 1000 mg via INTRAVENOUS
  Filled 2013-01-21: qty 100

## 2013-01-21 MED ORDER — POTASSIUM CHLORIDE 10 MEQ/50ML IV SOLN
10.0000 meq | Freq: Once | INTRAVENOUS | Status: AC
  Administered 2013-01-21: 10 meq via INTRAVENOUS

## 2013-01-21 MED ORDER — LACTATED RINGERS IV SOLN: 500.0000 mL | Freq: Once | INTRAVENOUS | Status: AC | PRN

## 2013-01-21 MED ORDER — LACTATED RINGERS IV SOLN
INTRAVENOUS | Status: DC | PRN
  Administered 2013-01-21 (×2): via INTRAVENOUS

## 2013-01-21 MED ORDER — LACTATED RINGERS IV SOLN
INTRAVENOUS | Status: DC | PRN
  Administered 2013-01-21: 10:00:00 via INTRAVENOUS

## 2013-01-21 MED ORDER — METOPROLOL TARTRATE 12.5 MG HALF TABLET
12.5000 mg | ORAL_TABLET | Freq: Once | ORAL | Status: AC
  Administered 2013-01-21: 12.5 mg via ORAL

## 2013-01-21 MED ORDER — BISACODYL 5 MG PO TBEC
10.0000 mg | DELAYED_RELEASE_TABLET | Freq: Every day | ORAL | Status: DC
  Administered 2013-01-22 – 2013-01-23 (×2): 10 mg via ORAL
  Filled 2013-01-21 (×3): qty 2

## 2013-01-21 MED ORDER — FINASTERIDE 5 MG PO TABS
5.0000 mg | ORAL_TABLET | Freq: Every day | ORAL | Status: DC
  Administered 2013-01-22 – 2013-01-25 (×4): 5 mg via ORAL
  Filled 2013-01-21 (×4): qty 1

## 2013-01-21 SURGICAL SUPPLY — 90 items
ATTRACTOMAT 16X20 MAGNETIC DRP (DRAPES) ×2 IMPLANT
BAG DECANTER FOR FLEXI CONT (MISCELLANEOUS) ×2 IMPLANT
BANDAGE ELASTIC 4 VELCRO ST LF (GAUZE/BANDAGES/DRESSINGS) ×2 IMPLANT
BANDAGE ELASTIC 6 VELCRO ST LF (GAUZE/BANDAGES/DRESSINGS) ×2 IMPLANT
BANDAGE GAUZE ELAST BULKY 4 IN (GAUZE/BANDAGES/DRESSINGS) ×2 IMPLANT
BASKET HEART (ORDER IN 25'S) (MISCELLANEOUS) ×1
BASKET HEART (ORDER IN 25S) (MISCELLANEOUS) ×1 IMPLANT
BLADE STERNUM SYSTEM 6 (BLADE) ×2 IMPLANT
CANISTER SUCTION 2500CC (MISCELLANEOUS) ×2 IMPLANT
CANNULA EZ GLIDE AORTIC 21FR (CANNULA) ×2 IMPLANT
CANNULA VENOUS LOW PROF 34X46 (CANNULA) ×2 IMPLANT
CATH CPB KIT HENDRICKSON (MISCELLANEOUS) ×2 IMPLANT
CATH ROBINSON RED A/P 18FR (CATHETERS) ×2 IMPLANT
CATH THORACIC 36FR (CATHETERS) ×2 IMPLANT
CATH THORACIC 36FR RT ANG (CATHETERS) ×2 IMPLANT
CLIP FOGARTY SPRING 6M (CLIP) ×1 IMPLANT
CLIP TI MEDIUM 24 (CLIP) IMPLANT
CLIP TI WIDE RED SMALL 24 (CLIP) ×3 IMPLANT
CLOTH BEACON ORANGE TIMEOUT ST (SAFETY) ×2 IMPLANT
COVER SURGICAL LIGHT HANDLE (MISCELLANEOUS) ×2 IMPLANT
CRADLE DONUT ADULT HEAD (MISCELLANEOUS) ×2 IMPLANT
DRAPE CARDIOVASCULAR INCISE (DRAPES) ×2
DRAPE SLUSH/WARMER DISC (DRAPES) ×2 IMPLANT
DRAPE SRG 135X102X78XABS (DRAPES) ×1 IMPLANT
DRSG COVADERM 4X14 (GAUZE/BANDAGES/DRESSINGS) ×2 IMPLANT
ELECT REM PT RETURN 9FT ADLT (ELECTROSURGICAL) ×4
ELECTRODE REM PT RTRN 9FT ADLT (ELECTROSURGICAL) ×2 IMPLANT
GLOVE BIO SURGEON STRL SZ 6 (GLOVE) ×1 IMPLANT
GLOVE BIO SURGEON STRL SZ 6.5 (GLOVE) ×8 IMPLANT
GLOVE BIOGEL PI IND STRL 6 (GLOVE) IMPLANT
GLOVE BIOGEL PI IND STRL 6.5 (GLOVE) IMPLANT
GLOVE BIOGEL PI IND STRL 7.0 (GLOVE) IMPLANT
GLOVE BIOGEL PI INDICATOR 6 (GLOVE) ×1
GLOVE BIOGEL PI INDICATOR 6.5 (GLOVE) ×3
GLOVE BIOGEL PI INDICATOR 7.0 (GLOVE) ×4
GLOVE EUDERMIC 7 POWDERFREE (GLOVE) ×6 IMPLANT
GOWN PREVENTION PLUS XLARGE (GOWN DISPOSABLE) ×4 IMPLANT
GOWN STRL NON-REIN LRG LVL3 (GOWN DISPOSABLE) ×13 IMPLANT
HEMOSTAT POWDER SURGIFOAM 1G (HEMOSTASIS) ×6 IMPLANT
HEMOSTAT SURGICEL 2X14 (HEMOSTASIS) ×2 IMPLANT
INSERT FOGARTY XLG (MISCELLANEOUS) IMPLANT
KIT BASIN OR (CUSTOM PROCEDURE TRAY) ×2 IMPLANT
KIT ROOM TURNOVER OR (KITS) ×2 IMPLANT
KIT SUCTION CATH 14FR (SUCTIONS) ×4 IMPLANT
KIT VASOVIEW W/TROCAR VH 2000 (KITS) ×2 IMPLANT
MARKER GRAFT CORONARY BYPASS (MISCELLANEOUS) ×6 IMPLANT
NS IRRIG 1000ML POUR BTL (IV SOLUTION) ×11 IMPLANT
PACK OPEN HEART (CUSTOM PROCEDURE TRAY) ×2 IMPLANT
PAD ARMBOARD 7.5X6 YLW CONV (MISCELLANEOUS) ×4 IMPLANT
PAD ELECT DEFIB RADIOL ZOLL (MISCELLANEOUS) ×2 IMPLANT
PENCIL BUTTON HOLSTER BLD 10FT (ELECTRODE) ×2 IMPLANT
PUNCH AORTIC ROTATE 4.0MM (MISCELLANEOUS) IMPLANT
PUNCH AORTIC ROTATE 4.5MM 8IN (MISCELLANEOUS) ×1 IMPLANT
PUNCH AORTIC ROTATE 5MM 8IN (MISCELLANEOUS) IMPLANT
SPONGE GAUZE 4X4 12PLY (GAUZE/BANDAGES/DRESSINGS) ×4 IMPLANT
SPONGE LAP 4X18 X RAY DECT (DISPOSABLE) ×1 IMPLANT
SUT BONE WAX W31G (SUTURE) ×2 IMPLANT
SUT MNCRL AB 4-0 PS2 18 (SUTURE) ×1 IMPLANT
SUT PROLENE 3 0 SH DA (SUTURE) ×2 IMPLANT
SUT PROLENE 4 0 RB 1 (SUTURE)
SUT PROLENE 4 0 SH DA (SUTURE) IMPLANT
SUT PROLENE 4-0 RB1 .5 CRCL 36 (SUTURE) IMPLANT
SUT PROLENE 6 0 C 1 30 (SUTURE) ×5 IMPLANT
SUT PROLENE 7 0 BV1 MDA (SUTURE) ×4 IMPLANT
SUT PROLENE 8 0 BV175 6 (SUTURE) ×2 IMPLANT
SUT SILK  1 MH (SUTURE)
SUT SILK 1 MH (SUTURE) IMPLANT
SUT STEEL 6MS V (SUTURE) ×1 IMPLANT
SUT STEEL STERNAL CCS#1 18IN (SUTURE) IMPLANT
SUT STEEL SZ 6 DBL 3X14 BALL (SUTURE) ×1 IMPLANT
SUT VIC AB 1 CTX 36 (SUTURE) ×4
SUT VIC AB 1 CTX36XBRD ANBCTR (SUTURE) ×2 IMPLANT
SUT VIC AB 2-0 CT1 27 (SUTURE) ×2
SUT VIC AB 2-0 CT1 TAPERPNT 27 (SUTURE) IMPLANT
SUT VIC AB 2-0 CTX 27 (SUTURE) IMPLANT
SUT VIC AB 3-0 SH 27 (SUTURE)
SUT VIC AB 3-0 SH 27X BRD (SUTURE) IMPLANT
SUT VIC AB 3-0 X1 27 (SUTURE) IMPLANT
SUT VICRYL 4-0 PS2 18IN ABS (SUTURE) IMPLANT
SUTURE E-PAK OPEN HEART (SUTURE) ×2 IMPLANT
SYSTEM SAHARA CHEST DRAIN ATS (WOUND CARE) ×2 IMPLANT
TAPE CLOTH SURG 4X10 WHT LF (GAUZE/BANDAGES/DRESSINGS) ×1 IMPLANT
TAPE PAPER 3X10 WHT MICROPORE (GAUZE/BANDAGES/DRESSINGS) ×1 IMPLANT
TOWEL OR 17X24 6PK STRL BLUE (TOWEL DISPOSABLE) ×4 IMPLANT
TOWEL OR 17X26 10 PK STRL BLUE (TOWEL DISPOSABLE) ×4 IMPLANT
TRAY FOLEY IC TEMP SENS 14FR (CATHETERS) ×2 IMPLANT
TUBE FEEDING 8FR 16IN STR KANG (MISCELLANEOUS) ×1 IMPLANT
TUBING INSUFFLATION 10FT LAP (TUBING) ×2 IMPLANT
UNDERPAD 30X30 INCONTINENT (UNDERPADS AND DIAPERS) ×2 IMPLANT
WATER STERILE IRR 1000ML POUR (IV SOLUTION) ×4 IMPLANT

## 2013-01-21 NOTE — Anesthesia Procedure Notes (Signed)
Procedure Name: Intubation Date/Time: 01/21/2013 11:58 AM Performed by: Tyrone Nine Pre-anesthesia Checklist: Patient identified, Timeout performed, Emergency Drugs available, Suction available and Patient being monitored Patient Re-evaluated:Patient Re-evaluated prior to inductionOxygen Delivery Method: Circle system utilized Preoxygenation: Pre-oxygenation with 100% oxygen Intubation Type: IV induction Ventilation: Mask ventilation without difficulty and Oral airway inserted - appropriate to patient size Laryngoscope Size: Mac and 3 Grade View: Grade II Tube type: Oral Tube size: 8.0 mm Number of attempts: 1 Airway Equipment and Method: Stylet Placement Confirmation: ETT inserted through vocal cords under direct vision,  breath sounds checked- equal and bilateral and positive ETCO2 Secured at: 23 cm Tube secured with: Tape Dental Injury: Teeth and Oropharynx as per pre-operative assessment

## 2013-01-21 NOTE — Brief Op Note (Addendum)
01/21/2013  3:29 PM  PATIENT:  Janeann Forehand  78 y.o. male  PRE-OPERATIVE DIAGNOSIS:  CAD (INCLUDED LEFT MAIN DISEASE)  POST-OPERATIVE DIAGNOSIS:  CAD(INCLUDED LEFT MAIN DISEASE)   PROCEDURE: MEDIAN STERNOTOMY for Coronary Arery Bypass Grafting x 5 (LIMA TO LAD, SVG SEQUENTIALLY TO RAMUS INT AND OM1, SVG SEQUENTIALLY TO DISTAL RCA AND PDA) with EVH from the right thigh and calf  SURGEON:  Surgeon(s) and Role:    * Loreli Slot, MD - Primary  PHYSICIAN ASSISTANT: Doree Fudge PA-C   ANESTHESIA:   general  EBL:  Total I/O In: 2700 [I.V.:2700] Out: 300 [Urine:300]   DRAINS: Chest Tube(s) in the Mediastinal and pleural spaces   COUNTS CORRECT:  YES  PLAN OF CARE: Admit to inpatient   PATIENT DISPOSITION:  ICU - intubated and hemodynamically stable.   Delay start of Pharmacological VTE agent (>24hrs) due to surgical blood loss or risk of bleeding: yes  PRE OP WEIGHT: 81 kg   XC= 79 min CPB = 109 min

## 2013-01-21 NOTE — Preoperative (Signed)
Beta Blockers   Reason not to administer Beta Blockers:Not Applicable 

## 2013-01-21 NOTE — H&P (View-Only) (Signed)
PCP is Jose Paz, MD Referring Provider is Croitoru, Mihai, MD  Chief Complaint  Patient presents with  . Coronary Artery Disease    surgical eval for severe CAD, cardic cath 01/11/13    HPI: 78-year-old male with multiple cardiac risk factors presents with chief complaint of chest pain with exertion.  Charles [name] is a 78-year-old gentleman with a history of hypertension, hyperlipidemia, and type 2 diabetes (diet controlled). He first noted tightness and pressure in his chest with exertion back in January. He had been walking about 4 miles a day. He began to have chest pressure towards the end of his walks. Over time that progressed to earlier and earlier in his walking. He attributed this to bronchitis and the cold weather and just cut back back on his exercise. A few weeks ago he tried to resume his walking, however, he began experiencing chest tightness and pressure once again. He saw Dr. Paz who thought this was angina. A stress test was performed and showed evidence of inferior ischemia. He then underwent cardiac catheterization by Dr. Croitoru and was found to have left main and three-vessel coronary disease. He has well-preserved ventricular function with ejection fraction 65%. He has not had any rest or nocturnal symptoms.   Past Medical History  Diagnosis Date  . Hypertension   . Hyperlipidemia     diet controlled, statins and zetia intolerant  . Diabetes mellitus   . Retinopathy     mild glaucomea  . Transient ischemic attack 1999  . Histoplasmosis   . Chronic cough 08/2009    -allergy profile- june 2,2011- neg. -Max GERD rx/ off oils January 07 2010 x 3 weeks only but no benefit. - Sinus CT rec July 11,2011- refused due to claustrophobia -MCT neg for asthma 03/01/10 -Add 1st Gen H1 July 11,2011- better  . Macular degeneration     Past Surgical History  Procedure Laterality Date  . Cervical spine surgery  2002    plate  . Cholecystectomy  1985    Family History  Problem Relation  Age of Onset  . Coronary artery disease Neg Hx   . Stroke Neg Hx   . Colon cancer Neg Hx   . Prostate cancer Neg Hx   . Atopy Neg Hx   . Asthma Neg Hx   . Hypertension Mother   . Hypertension Father   . Diabetes Father   . Cancer Father     colon  . Hypertension Brother   . Cancer Brother     lung  . Hypertension Sister   . Cancer Sister     lung  . Diabetes Brother   . Diabetes Sister   . Diabetes      Grandparents   . Lung cancer Brother   . Lung cancer Sister     Social History History  Substance Use Topics  . Smoking status: Former Smoker  . Smokeless tobacco: Not on file     Comment: Stopped at age 37   . Alcohol Use: Yes     Comment: seldom    Current Outpatient Prescriptions  Medication Sig Dispense Refill  . aspirin EC 81 MG tablet Take 81 mg by mouth daily.      . Cholecalciferol (VITAMIN D3) 2000 UNITS TABS Take 2,000 Units by mouth daily.      . finasteride (PROSCAR) 5 MG tablet Take 5 mg by mouth daily.      . FREESTYLE LITE test strip USE AS DIRECTED THREE TIMES DAILY  100   each  6  . Glucose Blood (FREESTYLE LITE TEST VI) by In Vitro route.        . KRILL OIL 1000 MG CAPS Take 1,000 mg by mouth daily.       . Lancets (FREESTYLE) lancets 1 each by Other route as needed. Use as instructed       . nitroGLYCERIN (NITROSTAT) 0.4 MG SL tablet Place 0.4 mg under the tongue every 5 (five) minutes as needed for chest pain.      . OVER THE COUNTER MEDICATION Take 1 tablet by mouth daily. Macular degeneration vitamin.      . triamterene-hydrochlorothiazide (DYAZIDE) 37.5-25 MG per capsule Take 1 capsule by mouth daily.       No current facility-administered medications for this visit.    Allergies  Allergen Reactions  . Atorvastatin     REACTION: myalgia  . Ezetimibe     REACTION: dizzy  . Lovastatin     REACTION: weakness    Review of Systems  Constitutional: Positive for activity change, fatigue and unexpected weight change (weight gain).  HENT:  Positive for hearing loss.   Eyes:       Floaters  Respiratory: Positive for cough (non productive) and shortness of breath.   Cardiovascular: Positive for chest pain.  Genitourinary:       BPH- symptoms OK on meds  Musculoskeletal:       Leg cramps  Neurological:       TIA after cervical spine surgery 1999  All other systems reviewed and are negative.    BP 144/82  Pulse 92  Resp 20  Ht 5' 11" (1.803 m)  Wt 180 lb (81.647 kg)  BMI 25.12 kg/m2  SpO2 98% Physical Exam  Vitals reviewed. Constitutional: He is oriented to person, place, and time. He appears well-developed and well-nourished. No distress.  HENT:  Head: Normocephalic and atraumatic.  Eyes: EOM are normal. Pupils are equal, round, and reactive to light.  Neck: No tracheal deviation present. No thyromegaly present.  Limited flexion, no bruits  Cardiovascular: Normal rate, regular rhythm, normal heart sounds and intact distal pulses.  Exam reveals no gallop and no friction rub.   No murmur heard. Pulmonary/Chest: Effort normal and breath sounds normal. He has no wheezes. He has no rales.  Abdominal: Soft. There is no tenderness.  Well healed surgical scar  Musculoskeletal: Normal range of motion. He exhibits no edema.  Lymphadenopathy:    He has no cervical adenopathy.  Neurological: He is alert and oriented to person, place, and time. No cranial nerve deficit.  Skin: Skin is warm and dry.  Psychiatric: He has a normal mood and affect.     Diagnostic Tests: Cardiac catheterization CARDIAC CATHETERIZATION REPORT   Procedures performed:  1. Left heart catheterization  2. Selective coronary angiography  3. Left ventriculography  Reason for procedure:  Stable angina pectoris  Abnormal nuclear stress test/stress echo  Procedure performed by: Mihai Croitoru, MD, FACC  Complications: none  Estimated blood loss: less than 5 mL  History: 78-year-old man with complaints typical for exertional angina pectoris  with a slow pattern of worsening. Nuclear stress test showed evidence of ischemia. He is here for diagnostic left heart catheterization and possible percutaneous revascularization if appropriate.  Consent: The risks, benefits, and details of the procedure were explained to the patient. Risks including death, MI, stroke, bleeding, limb ischemia, renal failure and allergy were described and accepted by the patient. Informed written consent was obtained prior to proceeding.    Technique: The patient was brought to the cardiac catheterization laboratory in the fasting state. He was prepped and draped in the usual sterile fashion. Local anesthesia with 1% lidocaine was administered to the right wrist area. Using the modified Seldinger technique a 5 French right radial artery sheath was introduced without difficulty. Under fluoroscopic guidance, using 5 French JL4, JR and angled pigtail catheters, selective cannulation of the left coronary artery, right coronary artery and left ventricle were respectively performed. Several coronary angiograms in a variety of projections were recorded, as well as a left ventriculogram in the RAO projection. Left ventricular pressure and a pull back to the aorta were recorded. No immediate complications occurred. At the end of the procedure, all catheters were removed. After the procedure, hemostasis will be achieved with manual pressure.  Contrast used: 65 mL Omnipaque  Angiographic Findings:  1. The left main coronary artery exhibited significant atherosclerosis and calcification, including a 50% stenosis in its midportion. It bifurcates in the usual fashion into the left anterior descending artery and left circumflex coronary artery.  2. The left anterior descending artery is a medium size vessel that barely reaches the apex and generates 3 major diagonal branches. The first diagonal branch is very proximal, very large and operates as a ramus intermedius type vessel.  There is  evidence of mild luminal irregularities and mild calcification in the LAD artery. The maximum diameter stenosis is probably 40%, no hemodynamically meaningful stenoses are seen.  The large first diagonal artery has a smooth 60-70% proximal stenosis  3. The left circumflex coronary artery is a relatively small-size vessel non- dominant vessel that generates only one major oblique marginal artery. There is evidence of moderate luminal irregularities and moderate calcification. There are 2 sequential hemodynamically meaningful stenoses in the proximal third of the oblique marginal artery, which is diffusely diseased. The maximum diameter stenosis is approximately 70%.  4. The right coronary artery is a very large-size dominant vessel that generates a branching posterior lateral ventricular system as well as a very long posterior descending artery that supplies the left ventricular apex. There is evidence of extensive luminal irregularities and moderate calcification. Multiple hemodynamically meaningful stenoses are seen. There is a 85-90% stenosis in the mid third of the right coronary artery. There is a 85-90% stenosis in the proximal posterior descending artery, that appears irregular and may be ulcerated. There is a 60-70% stenosis in the mid posterior descending artery. The posterior lateral ventricular branch is relatively free of disease.  5. The left ventricle is normal in size. The left ventricle systolic function is normal to hyperdynamic with an estimated ejection fraction of 65-70%. Regional wall motion abnormalities are not seen. No left ventricular thrombus is seen. There is no mitral insufficiency. The ascending aorta appears normal. There is no aortic valve stenosis by pullback. The left ventricular end-diastolic pressure is 14 mm Hg.   IMPRESSIONS:  3 vessel coronary artery disease with a 50% stenosis in the left main coronary artery and a high-grade ulcerated stenosis in the mid right coronary  artery as well as lesions in secondary branches.  The patient has preserved left extrasystolic function, but has diabetes mellitus.  The patient is symptomatic (exertional angina).  RECOMMENDATION:  Coronary artery bypass surgery: Suggest LIMA to LAD, SVG to ramus intermedius, SVG to OM, SVG to posterior descending artery, SVG to posterolateral ventricular artery.        Impression: Charles [name] is a 78-year-old gentleman with multiple cardiac risk factors but no prior history of   coronary disease. He has a six-month history of exertional angina. He has had recent progression of his symptoms. A stress test showed inferior ischemia. Cardiac catheterization revealed left main and three-vessel coronary disease with preserved left ventricular function. Coronary bypass grafting is indicated for survival benefit and relief of symptoms.  I discussed with the patient and his wife the general nature of the procedure, need for general anesthesia, and the incisions to be used. I discussed the expected hospital stay, overall recovery and short and long term outcomes. We discussed the indications, risks, benefits, and alternatives. They understand the risks include but are not limited to death, stroke, MI, DVT/PE, bleeding, possible need for transfusion, infections, cardiac arrhythmias, and other organ system dysfunction including respiratory, renal, or GI complications. They understand and accept these risks and agree to proceed.   Plan: Coronary bypass grafting on Monday, June 16. He will be admitted on the day of procedure. 

## 2013-01-21 NOTE — OR Nursing (Signed)
First call to SICU @ 1600.  Also call at this time to volunteer desk to notify family that patient was off bypass.  1626 second call to SICU

## 2013-01-21 NOTE — Transfer of Care (Signed)
Immediate Anesthesia Transfer of Care Note  Patient: Charles [name]  Procedure(s) Performed: Procedure(s): Coronary Arery Bypass Grafting  Times Five Using Left Internal Mammary Artery and Right Saphenous Leg Vein Harvested Endoscopically (N/A)  Patient Location: PACU and SICU  Anesthesia Type:General  Level of Consciousness: unresponsive and Patient remains intubated per anesthesia plan  Airway & Oxygen Therapy: Patient remains intubated per anesthesia plan and Patient placed on Ventilator (see vital sign flow sheet for setting)  Post-op Assessment: Report given to PACU RN and Post -op Vital signs reviewed and stable  Post vital signs: Reviewed and stable  Complications: No apparent anesthesia complications

## 2013-01-21 NOTE — Progress Notes (Signed)
S/p CABG x 5  Intubated, sedated  BP 177/106  Pulse 80  Temp(Src) 98 F (36.7 C) (Oral)  Resp 20  Ht 5\' 11"  (1.803 m)  Wt 179 lb (81.194 kg)  BMI 24.98 kg/m2  SpO2 97%   Intake/Output Summary (Last 24 hours) at 01/21/13 1815 Last data filed at 01/21/13 1708  Gross per 24 hour  Intake   3995 ml  Output   1850 ml  Net   2145 ml      Looks great, good hemodynamics, minimal CT output  Wean vent

## 2013-01-21 NOTE — Interval H&P Note (Signed)
History and Physical Interval Note:  01/21/2013 11:16 AM  Charles [name]  has presented today for surgery, with the diagnosis of CAD LEFT MAIN  The various methods of treatment have been discussed with the patient and family. After consideration of risks, benefits and other options for treatment, the patient has consented to  Procedure(s): CORONARY ARTERY BYPASS GRAFTING (CABG) (N/A) as a surgical intervention .  The patient's history has been reviewed, patient examined, no change in status, stable for surgery.  I have reviewed the patient's chart and labs.  Questions were answered to the patient's satisfaction.     Terena Bohan C

## 2013-01-21 NOTE — Anesthesia Preprocedure Evaluation (Addendum)
Anesthesia Evaluation  Patient identified by MRN, date of birth, ID band Patient awake    Reviewed: Allergy & Precautions, H&P , NPO status , Patient's Chart, lab work & pertinent test results  History of Anesthesia Complications (+) PONV  Airway Mallampati: II TM Distance: >3 FB Neck ROM: full    Dental  (+) Teeth Intact and Dental Advisory Given   Pulmonary Current Smoker,    Pulmonary exam normal       Cardiovascular hypertension, + angina + CAD Rhythm:regular Rate:Normal     Neuro/Psych CVA    GI/Hepatic   Endo/Other  diabetes, Type 2  Renal/GU      Musculoskeletal   Abdominal Normal abdominal exam  (+)   Peds  Hematology   Anesthesia Other Findings Small Mouth  Reproductive/Obstetrics                          Anesthesia Physical Anesthesia Plan  ASA: III  Anesthesia Plan: General   Post-op Pain Management:    Induction: Intravenous  Airway Management Planned: Oral ETT  Additional Equipment: Arterial line, CVP and PA Cath  Intra-op Plan:   Post-operative Plan: Post-operative intubation/ventilation  Informed Consent: I have reviewed the patients History and Physical, chart, labs and discussed the procedure including the risks, benefits and alternatives for the proposed anesthesia with the patient or authorized representative who has indicated his/her understanding and acceptance.   Dental advisory given  Plan Discussed with: CRNA, Anesthesiologist and Surgeon  Anesthesia Plan Comments:        Anesthesia Quick Evaluation

## 2013-01-21 NOTE — Progress Notes (Signed)
Wean began at 2200, pt failed on CPAP/PS due to lack of patient effort.  Patient previously restarted on precedex and given prn doses of versed for agitation.  Will leave precedex off and attempt wean again.

## 2013-01-22 ENCOUNTER — Encounter (HOSPITAL_COMMUNITY): Payer: Self-pay | Admitting: Thoracic Surgery (Cardiothoracic Vascular Surgery)

## 2013-01-22 ENCOUNTER — Inpatient Hospital Stay (HOSPITAL_COMMUNITY): Payer: Medicare Other

## 2013-01-22 DIAGNOSIS — E119 Type 2 diabetes mellitus without complications: Secondary | ICD-10-CM

## 2013-01-22 DIAGNOSIS — I2510 Atherosclerotic heart disease of native coronary artery without angina pectoris: Principal | ICD-10-CM

## 2013-01-22 LAB — GLUCOSE, CAPILLARY
Glucose-Capillary: 101 mg/dL — ABNORMAL HIGH (ref 70–99)
Glucose-Capillary: 123 mg/dL — ABNORMAL HIGH (ref 70–99)
Glucose-Capillary: 125 mg/dL — ABNORMAL HIGH (ref 70–99)
Glucose-Capillary: 126 mg/dL — ABNORMAL HIGH (ref 70–99)
Glucose-Capillary: 168 mg/dL — ABNORMAL HIGH (ref 70–99)
Glucose-Capillary: 91 mg/dL (ref 70–99)
Glucose-Capillary: 100 mg/dL — ABNORMAL HIGH (ref 70–99)
Glucose-Capillary: 98 mg/dL (ref 70–99)
Glucose-Capillary: 112 mg/dL — ABNORMAL HIGH (ref 70–99)
Glucose-Capillary: 120 mg/dL — ABNORMAL HIGH (ref 70–99)
Glucose-Capillary: 145 mg/dL — ABNORMAL HIGH (ref 70–99)

## 2013-01-22 LAB — POCT I-STAT 3, ART BLOOD GAS (G3+)
Acid-base deficit: 3 mmol/L — ABNORMAL HIGH (ref 0.0–2.0)
O2 Saturation: 97 %
Patient temperature: 36.6
Patient temperature: 36.9
pCO2 arterial: 44.3 mmHg (ref 35.0–45.0)
pH, Arterial: 7.316 — ABNORMAL LOW (ref 7.350–7.450)
pH, Arterial: 7.346 — ABNORMAL LOW (ref 7.350–7.450)
pO2, Arterial: 98 mmHg (ref 80.0–100.0)

## 2013-01-22 LAB — POCT I-STAT, CHEM 8
Calcium, Ion: 1.23 mmol/L (ref 1.13–1.30)
Creatinine, Ser: 1.4 mg/dL — ABNORMAL HIGH (ref 0.50–1.35)
Glucose, Bld: 145 mg/dL — ABNORMAL HIGH (ref 70–99)
HCT: 36 % — ABNORMAL LOW (ref 39.0–52.0)
Hemoglobin: 12.2 g/dL — ABNORMAL LOW (ref 13.0–17.0)
Potassium: 4.3 mEq/L (ref 3.5–5.1)
TCO2: 25 mmol/L (ref 0–100)

## 2013-01-22 LAB — BASIC METABOLIC PANEL
BUN: 22 mg/dL (ref 6–23)
CO2: 24 mEq/L (ref 19–32)
Calcium: 8.8 mg/dL (ref 8.4–10.5)
GFR calc non Af Amer: 58 mL/min — ABNORMAL LOW (ref >90)
Glucose, Bld: 126 mg/dL — ABNORMAL HIGH (ref 70–99)
Potassium: 3.9 mEq/L (ref 3.5–5.1)
Sodium: 139 mEq/L (ref 135–145)

## 2013-01-22 LAB — CBC
HCT: 31 % — ABNORMAL LOW (ref 39.0–52.0)
Hemoglobin: 10.6 g/dL — ABNORMAL LOW (ref 13.0–17.0)
MCH: 31.1 pg (ref 26.0–34.0)
MCHC: 34.2 g/dL (ref 30.0–36.0)
MCV: 90.3 fL (ref 78.0–100.0)
Platelets: 110 10*3/uL — ABNORMAL LOW (ref 150–400)
Platelets: 153 10*3/uL (ref 150–400)
RBC: 3.31 MIL/uL — ABNORMAL LOW (ref 4.22–5.81)
RBC: 3.41 MIL/uL — ABNORMAL LOW (ref 4.22–5.81)
RBC: 3.85 MIL/uL — ABNORMAL LOW (ref 4.22–5.81)
RDW: 13.1 % (ref 11.5–15.5)
WBC: 15.3 10*3/uL — ABNORMAL HIGH (ref 4.0–10.5)
WBC: 20.7 10*3/uL — ABNORMAL HIGH (ref 4.0–10.5)

## 2013-01-22 LAB — CREATININE, SERUM
Creatinine, Ser: 1.36 mg/dL — ABNORMAL HIGH (ref 0.50–1.35)
GFR calc Af Amer: 57 mL/min — ABNORMAL LOW (ref >90)

## 2013-01-22 MED ORDER — LORAZEPAM 0.5 MG PO TABS
0.5000 mg | ORAL_TABLET | Freq: Four times a day (QID) | ORAL | Status: DC | PRN
  Administered 2013-01-22 – 2013-01-23 (×2): 0.5 mg via ORAL
  Filled 2013-01-22 (×2): qty 1

## 2013-01-22 MED ORDER — POTASSIUM CHLORIDE 10 MEQ/50ML IV SOLN
10.0000 meq | INTRAVENOUS | Status: AC
Start: 2013-01-22 — End: 2013-01-22
  Administered 2013-01-22 (×4): 10 meq via INTRAVENOUS
  Filled 2013-01-22: qty 100

## 2013-01-22 MED ORDER — INSULIN ASPART 100 UNIT/ML ~~LOC~~ SOLN
0.0000 [IU] | SUBCUTANEOUS | Status: DC
  Administered 2013-01-22 – 2013-01-23 (×3): 2 [IU] via SUBCUTANEOUS

## 2013-01-22 MED ORDER — FUROSEMIDE 10 MG/ML IJ SOLN
40.0000 mg | Freq: Once | INTRAMUSCULAR | Status: AC
  Administered 2013-01-22: 40 mg via INTRAVENOUS

## 2013-01-22 MED ORDER — CYCLOBENZAPRINE HCL 10 MG PO TABS
5.0000 mg | ORAL_TABLET | Freq: Three times a day (TID) | ORAL | Status: DC | PRN
  Administered 2013-01-22 – 2013-01-23 (×2): 5 mg via ORAL
  Filled 2013-01-22 (×2): qty 1

## 2013-01-22 MED ORDER — ENOXAPARIN SODIUM 40 MG/0.4ML ~~LOC~~ SOLN
40.0000 mg | Freq: Every day | SUBCUTANEOUS | Status: DC
  Administered 2013-01-22 – 2013-01-24 (×3): 40 mg via SUBCUTANEOUS
  Filled 2013-01-22 (×4): qty 0.4

## 2013-01-22 MED ORDER — INSULIN DETEMIR 100 UNIT/ML ~~LOC~~ SOLN
20.0000 [IU] | Freq: Every day | SUBCUTANEOUS | Status: DC
  Administered 2013-01-22: 20 [IU] via SUBCUTANEOUS
  Filled 2013-01-22 (×2): qty 0.2

## 2013-01-22 MED FILL — Sodium Chloride IV Soln 0.9%: INTRAVENOUS | Qty: 1000 | Status: AC

## 2013-01-22 MED FILL — Mannitol IV Soln 20%: INTRAVENOUS | Qty: 500 | Status: AC

## 2013-01-22 MED FILL — Lidocaine HCl IV Inj 20 MG/ML: INTRAVENOUS | Qty: 5 | Status: AC

## 2013-01-22 MED FILL — Sodium Chloride Irrigation Soln 0.9%: Qty: 1000 | Status: AC

## 2013-01-22 MED FILL — Heparin Sodium (Porcine) Inj 1000 Unit/ML: INTRAMUSCULAR | Qty: 10 | Status: AC

## 2013-01-22 MED FILL — Potassium Chloride Inj 2 mEq/ML: INTRAVENOUS | Qty: 40 | Status: AC

## 2013-01-22 MED FILL — Electrolyte-R (PH 7.4) Solution: INTRAVENOUS | Qty: 3000 | Status: AC

## 2013-01-22 MED FILL — Magnesium Sulfate Inj 50%: INTRAMUSCULAR | Qty: 10 | Status: AC

## 2013-01-22 MED FILL — Heparin Sodium (Porcine) Inj 1000 Unit/ML: INTRAMUSCULAR | Qty: 30 | Status: AC

## 2013-01-22 MED FILL — Sodium Bicarbonate IV Soln 8.4%: INTRAVENOUS | Qty: 50 | Status: AC

## 2013-01-22 NOTE — Procedures (Signed)
Extubation Procedure Note  Patient Details:   Name: Charles [name] DOB: 1935/04/16 MRN: [id number]   Airway Documentation:  Airway 8 mm (Active)  Secured at (cm) 24 cm 01/21/2013 11:32 PM  Measured From Lips 01/21/2013 11:32 PM  Secured Location Right 01/21/2013 11:32 PM  Secured By Caron Presume Tape 01/21/2013 11:32 PM  Site Condition Dry 01/21/2013  5:03 PM    Evaluation  O2 sats: stable throughout Complications: No apparent complications Patient did tolerate procedure well. Bilateral Breath Sounds: Clear Suctioning: Airway;Oral  Pt. Extubated to 4 lpm nasal cannula.  NIF -22, VC 1200. Positive cuff leak noted.  Pt. Able to vocalize post extubation. Vital signs stable.   Clent Ridges 01/22/2013, 12:37 AM

## 2013-01-22 NOTE — Progress Notes (Signed)
Patient ID: Charles [name], male   DOB: 1934/10/01, 78 y.o.   MRN: [id number] EVENING ROUNDS NOTE :     [address]       [place] [id number]             [phone]                 1 Day Post-Op Procedure(s) (LRB): Coronary Arery Bypass Grafting  Times Five Using Left Internal Mammary Artery and Right Saphenous Leg Vein Harvested Endoscopically (N/A)  Total Length of Stay:  LOS: 1 day  BP 132/78  Pulse 92  Temp(Src) 98 F (36.7 C) (Oral)  Resp 22  Ht 5\' 11"  (1.803 m)  Wt 193 lb 5.5 oz (87.7 kg)  BMI 26.98 kg/m2  SpO2 95%  .Intake/Output     06/17 0701 - 06/18 0700   I.V. (mL/kg) 299.9 (3.4)   Blood    NG/GT    IV Piggyback 250   Total Intake(mL/kg) 549.9 (6.3)   Urine (mL/kg/hr) 1590 (1.5)   Emesis/NG output    Blood    Chest Tube 20 (0)   Total Output 1610   Net -1060.1         . sodium chloride Stopped (01/22/13 0900)  . sodium chloride 20 mL/hr at 01/21/13 2143  . sodium chloride    . dexmedetomidine Stopped (01/21/13 2200)  . insulin (NOVOLIN-R) infusion 2 Units/hr (01/22/13 1000)  . lactated ringers 20 mL/hr at 01/21/13 1700  . nitroGLYCERIN Stopped (01/22/13 0100)  . phenylephrine (NEO-SYNEPHRINE) Adult infusion Stopped (01/21/13 1915)     Lab Results  Component Value Date   WBC 20.7* 01/22/2013   HGB 12.2* 01/22/2013   HCT 36.0* 01/22/2013   PLT 153 01/22/2013   GLUCOSE 145* 01/22/2013   CHOL 228* 07/25/2011   TRIG 159.0* 07/25/2011   HDL 51.70 07/25/2011   LDLDIRECT 151.6 07/25/2011   LDLCALC 70 09/27/2007   ALT 31 01/17/2013   AST 24 01/17/2013   NA 140 01/22/2013   K 4.3 01/22/2013   CL 106 01/22/2013   CREATININE 1.40* 01/22/2013   BUN 25* 01/22/2013   CO2 24 01/22/2013   TSH 0.72 12/18/2012   INR 1.46 01/21/2013   HGBA1C 6.1* 01/17/2013   MICROALBUR 2.4* 07/25/2011  Mildly agitated Started on ativan tonight    Delight Ovens MD  Beeper [phone] Office [phone] 01/22/2013 7:23 PM

## 2013-01-22 NOTE — Op Note (Signed)
NAMEMarland Kitchen  IOKEPA, GEFFRE NO.:  [id number]  MEDICAL RECORD NO.:  [id number]  LOCATION:  2311                         FACILITY:  [facility]  PHYSICIAN:  Salvatore Decent. Dorris Fetch, M.D.DATE OF BIRTH:  03/08/1935  DATE OF PROCEDURE:  01/21/2013 DATE OF DISCHARGE:                              OPERATIVE REPORT   PREOPERATIVE DIAGNOSIS:  Left main and three-vessel coronary artery disease.  POSTOPERATIVE DIAGNOSIS:  Left main and three-vessel coronary artery disease.  PROCEDURE:  Median sternotomy, extracorporeal circulation, coronary artery bypass grafting x5 (left internal mammary artery to left anterior descending, sequential saphenous vein graft to ramus intermedius and obtuse marginal 1, sequential saphenous vein graft to distal right coronary and posterior descending), endoscopic vein harvest right leg.  SURGEON:  Salvatore Decent. Dorris Fetch, M.D.  ASSISTANT:  Doree Fudge, PA-C  ANESTHESIA:  General.  FINDINGS:  Generally good quality targets, good quality conduits.  CLINICAL NOTE:  Charles [name] is a 78 year old gentleman with about a 6- month history of chest pressure with exertion.  He had a stress test, which showed inferior ischemia.  Cardiac catheterization revealed left main and three-vessel coronary artery disease with preserved left ventricular function.  He was advised to undergo coronary artery bypass grafting.  The indications, risks, benefits, and alternatives were discussed in detail with the patient.  He understood and accepted the risks and agreed to proceed.  OPERATIVE NOTE:  Mr. Galindo was brought to the preoperative holding area on January 21, 2013.  There Anesthesia placed a Swan-Ganz catheter and arterial blood pressure monitoring line.  Intravenous antibiotics were administered.  He was taken to the operating room, anesthetized, and intubated.  A Foley catheter was placed.  The chest, abdomen, and legs were prepped and draped in the usual sterile  fashion.  A median sternotomy was performed and the left internal mammary artery was harvested using standard technique.  Simultaneously, incision was made in the medial aspect of the right leg at the level of the knee.  The greater saphenous vein was harvested from groin to upper calf.  Both the mammary artery and saphenous vein were good quality.  2000 units of heparin was administered during the vessel harvest.  Remainder of the full heparin dose was given prior to opening the pericardium.  After harvesting the conduit, the pericardium was opened.  The ascending aorta was inspected.  There was no evidence of atherosclerotic disease. The ACT confirmed adequate anticoagulation.  The aorta was cannulated via concentric 2-0 Ethibond pledgeted pursestring sutures.  A dual-stage venous cannula was placed via pursestring suture in the right atrial appendage.  Cardiopulmonary bypass was instituted and the patient was cooled to 32 degrees Celsius.  The coronary arteries were inspected and the anastomotic sites were chosen.  The conduits were inspected and cut to length.  A foam pad was placed in the pericardium to insulate the heart and protect the left phrenic nerve.  A temperature probe was placed in the myocardial septum and a cardioplegic cannula was placed in the ascending aorta.  The aorta was crossclamped.  The left ventricle was emptied via the aortic root vent.  Cardiac arrest then was achieved with a combination of cold antegrade blood cardioplegia  and topical iced saline.  After achieving a complete diastolic arrest and adequate myocardial septal cooling with 1.5 L of cardioplegia, the following distal anastomoses were performed.  First, a reversed saphenous vein graft was placed sequentially to the distal right coronary and the posterior descending.  The posterolateral was too small to graft directly.  An arteriotomy was made in the distal right coronary just beyond the takeoff  the posterior descending.  This was a 1.5 mm vessel at the site.  A side-to-side anastomosis of the saphenous vein to the distal right was performed with a running 7-0 Prolene suture.  All anastomoses were probed proximally and distally at their completion prior to tying the sutures to ensure that the lumen had not been compromised.  There was good flow through this anastomosis. The distal end of the vein was beveled and then was anastomosed to the posterior descending distally.  The posterior descending had a tight Ostial plaque and then in the midportion of the vessel, there was another relatively tight plaque.  The graft was placed just beyond the second plaque. Again, this was an end-to-side anastomosis with a running 7-0 Prolene suture.  There was excellent flow through the graft.  Cardioplegia was administered.  There was good hemostasis at both anastomoses.  Next, a reversed saphenous vein graft was placed sequentially to the ramus intermedius and obtuse marginal 1.  These were both 1.5 mm targets.  There was some plaquing just proximal to the anastomosis, but both were good vessels at the site of the anastomosis.  A side-to-side anastomosis was performed to the ramus and an end-to-side to OM1, both with running 7-0 Prolene sutures, both probed easily proximally and distally at their completion.  Cardioplegia was administered down the graft.  There was good flow and good hemostasis.  Additional cardioplegia was administered down the veins as well as the aortic root.  The left internal mammary artery then was brought through a window in the pericardium and the distal end was beveled and was anastomosed end-to-side to the LAD.  The LAD was a 1.5 mm target vessel at the site of the anastomosis, but bifurcated into 2 smaller 1 mm branches just beyond the anastomosis.  The mammary artery was a 2 mm good quality conduit.  It was anastomosed end-to-side with a running 8-0 Prolene suture.   At the completion of the mammary to LAD anastomosis, the bulldog clamp was briefly removed to inspect for hemostasis. Immediate and rapid septal rewarming was noted.  The bulldog clamp was replaced and the mammary pedicle was tacked to the epicardial surface of the heart with 6-0 Prolene sutures.  Additional cardioplegia was administered.  The vein grafts were cut to length.  The proximal vein graft anastomoses then were performed to 4.5 mm punch aortotomies with running 6-0 Prolene sutures.  At the completion of the final proximal anastomosis, the patient was placed in Trendelenburg position.  Lidocaine was administered.  The aortic root was de-aired.  The bulldog clamp was again removed from the left mammary artery.  After de-airing the aortic root, the aortic crossclamp was removed.  The total crossclamp time was 79 minutes.  While rewarming was completed, all proximal and distal anastomoses were inspected for hemostasis.  Epicardial pacing wires were placed on the right ventricle and right atrium.  The patient did require a single defibrillation with 10 joules.  He was DDD paced after the defibrillation.  A dopamine infusion was started at 3 mcg per kg per minute.  When  he rewarmed to a core temperature of 37 degrees Celsius, he was weaned from cardiopulmonary bypass on the first attempt.  The total bypass time was 129  minutes.  The initial cardiac output was 4 L/minute, and he remained hemodynamically stable throughout the postbypass period.  A test dose protamine was administered and was well tolerated.  The atrial and aortic cannulae were removed.  The remainder of the protamine was administered without incident.  The chest was irrigated with warm saline.  Hemostasis was achieved.  The pericardium was reapproximated with interrupted 3-0 silk sutures.  It came together easily without tension or kinking the underlying grafts.  Left pleural and mediastinal chest tubes were  placed via separate subcostal incisions. The sternum was closed with a combination of single and double heavy gauge stainless steel wires.  The pectoralis fascia, subcutaneous tissue, and skin were closed in standard fashion.  All sponge, needle, and instrument counts were correct at the end of the procedure.  The patient was taken from the operating room to the surgical intensive care unit in good condition.     Salvatore Decent Dorris Fetch, M.D.     SCH/MEDQ  D:  01/21/2013  T:  01/22/2013  Job:  [id number]

## 2013-01-22 NOTE — Anesthesia Postprocedure Evaluation (Signed)
  Anesthesia Post-op Note  Patient: Charles [name]  Procedure(s) Performed: Procedure(s): Coronary Arery Bypass Grafting  Times Five Using Left Internal Mammary Artery and Right Saphenous Leg Vein Harvested Endoscopically (N/A)  Patient Location: SICU  Anesthesia Type:General  Level of Consciousness: sedated and Patient remains intubated per anesthesia plan  Airway and Oxygen Therapy: Patient remains intubated per anesthesia plan and Patient placed on Ventilator (see vital sign flow sheet for setting)  Post-op Pain: none  Post-op Assessment: Post-op Vital signs reviewed, Patient's Cardiovascular Status Stable, Respiratory Function Stable, Patent Airway, No signs of Nausea or vomiting and Pain level controlled  Post-op Vital Signs: stable  Complications: No apparent anesthesia complications

## 2013-01-22 NOTE — Progress Notes (Signed)
Utilization Review Completed. 01/22/2013  

## 2013-01-22 NOTE — Consult Note (Signed)
[facility]       CONSULTATION NOTE   Reason for Consult: CAD post CABG  Requesting Physician: Sunday Corn   Cardiologist: Arvie Bartholomew  HPI: This is a 78 y.o. male with a past medical history significant for recently diagnosed coronary artery disease with multivessel involvement, hypertension, hyperlipidemia, diet-controlled type 2 diabetes mellitus who is now 24 status post coronary bypass surgery. He presented with exertional angina pectoris and an abnormal myocardial perfusion study the lead to cardiac catheterization. There was 50% stenosis in the left main coronary artery and a high-grade ulcerated mid right coronary artery stenosis as well as stenosis in the proximal posterior descending artery and first diagonal artery and the major oblique marginal artery. Left ventricular systolic function is preserved. He underwent uncomplicated five-vessel coronary bypass surgery and is now extubated, off pressors and progressing well.  PMHx:  Past Medical History  Diagnosis Date  . Hypertension   . Hyperlipidemia     diet controlled, statins and zetia intolerant  . Retinopathy     mild glaucomea  . Transient ischemic attack 1999  . Histoplasmosis   . Chronic cough 08/2009    -allergy profile- june 2,2011- neg. -Max GERD rx/ off oils January 07 2010 x 3 weeks only but no benefit. - Sinus CT rec July 11,2011- refused due to claustrophobia -MCT neg for asthma 03/01/10 -Add 1st Gen H1 July 11,2011- better  . Macular degeneration   . PONV (postoperative nausea and vomiting)     extreme nausea  . Stroke     tia  . Anginal pain   . Coronary artery disease   . Cough   . Diabetes mellitus     no meds   Past Surgical History  Procedure Laterality Date  . Cervical spine surgery  2002    plate  . Cholecystectomy  1985  . Coronary artery bypass graft N/A 01/21/2013    Procedure: Coronary Arery Bypass Grafting  Times Five Using Left Internal Mammary Artery and  Right Saphenous Leg Vein Harvested Endoscopically;  Surgeon: Loreli Slot, MD;  Location: [facility] OR;  Service: Open Heart Surgery;  Laterality: N/A;    FAMHx: Family History  Problem Relation Age of Onset  . Coronary artery disease Neg Hx   . Stroke Neg Hx   . Colon cancer Neg Hx   . Prostate cancer Neg Hx   . Atopy Neg Hx   . Asthma Neg Hx   . Hypertension Mother   . Hypertension Father   . Diabetes Father   . Cancer Father     colon  . Hypertension Brother   . Cancer Brother     lung  . Hypertension Sister   . Cancer Sister     lung  . Diabetes Brother   . Diabetes Sister   . Diabetes      Grandparents   . Lung cancer Brother   . Lung cancer Sister     SOCHx:  reports that he quit smoking about 36 years ago. His smoking use included Cigarettes. He has a 10 pack-year smoking history. He does not have any smokeless tobacco history on file. He reports that  drinks alcohol. He reports that he does not use illicit drugs.  ALLERGIES: Allergies  Allergen Reactions  . Atorvastatin     REACTION: myalgia  . Ezetimibe     REACTION: dizzy  . Lovastatin     REACTION: weakness    ROS: Constitutional: negative for anorexia,  chills, fevers and sweats Eyes: negative for visual disturbance Ears, nose, mouth, throat, and face: negative for hoarseness Respiratory: negative for Dyspnea at rest Cardiovascular: negative for chest pain, irregular heart beat, orthopnea, palpitations, paroxysmal nocturnal dyspnea and syncope Gastrointestinal: negative for abdominal pain, melena, nausea and vomiting Hematologic/lymphatic: negative for bleeding and easy bruising Musculoskeletal:positive for back pain Neurological: negative Behavioral/Psych: negative for depression Endocrine: negative for diabetic symptoms including polydipsia and polyuria Allergic/Immunologic: negative  HOME MEDICATIONS: Prescriptions prior to admission  Medication Sig Dispense Refill  . aspirin EC 81 MG  tablet Take 162 mg by mouth daily.       . Cholecalciferol (VITAMIN D3) 2000 UNITS TABS Take 2,000 Units by mouth daily.      . finasteride (PROSCAR) 5 MG tablet Take 5 mg by mouth daily.      Marland Kitchen ibuprofen (ADVIL,MOTRIN) 200 MG tablet Take 600 mg by mouth every 8 (eight) hours as needed for pain.      Marland Kitchen KRILL OIL 1000 MG CAPS Take 1,000 mg by mouth daily.       . nitroGLYCERIN (NITROSTAT) 0.4 MG SL tablet Place 0.4 mg under the tongue every 5 (five) minutes as needed for chest pain.      Marland Kitchen OVER THE COUNTER MEDICATION Take 1 tablet by mouth daily. Macular degeneration vitamin.      Marland Kitchen triamterene-hydrochlorothiazide (DYAZIDE) 37.5-25 MG per capsule Take 1 capsule by mouth daily.        HOSPITAL MEDICATIONS: I have reviewed the patient's current medications.  VITALS: Blood pressure 141/49, pulse 82, temperature 98.9 F (37.2 C), temperature source Oral, resp. rate 16, height 5\' 11"  (1.803 m), weight 87.7 kg (193 lb 5.5 oz), SpO2 94.00%.  PHYSICAL EXAM: General appearance: alert, cooperative, no distress and Mild discomfort due to back pain; denies any incisional pain Neck: no adenopathy, no carotid bruit, no JVD, supple, symmetrical, trachea midline and thyroid not enlarged, symmetric, no tenderness/mass/nodules Lungs: clear to auscultation bilaterally and Mildly reduced breath sounds in both bases consistent with atelectasis Heart: regular rate and rhythm, S1, S2 normal, no murmur, click, rub or gallop Abdomen: soft, non-tender; bowel sounds normal; no masses,  no organomegaly Extremities: extremities normal, atraumatic, no cyanosis or edema and Healthy-appearing saphenectomy harvest site Pulses: 2+ and symmetric Skin: Skin color, texture, turgor normal. No rashes or lesions Neurologic: Grossly normal  LABS: Results for orders placed during the hospital encounter of 01/21/13 (from the past 48 hour(s))  GLUCOSE, CAPILLARY     Status: Abnormal   Collection Time    01/21/13  8:30 AM       Result Value Range   Glucose-Capillary 114 (*) 70 - 99 mg/dL  POCT I-STAT 4, (NA,K, GLUC, HGB,HCT)     Status: None   Collection Time    01/21/13 12:07 PM      Result Value Range   Sodium 142  135 - 145 mEq/L   Potassium 3.7  3.5 - 5.1 mEq/L   Glucose, Bld 99  70 - 99 mg/dL   HCT 16.1  09.6 - 04.5 %   Hemoglobin 13.6  13.0 - 17.0 g/dL  POCT I-STAT 4, (NA,K, GLUC, HGB,HCT)     Status: Abnormal   Collection Time    01/21/13  1:55 PM      Result Value Range   Sodium 141  135 - 145 mEq/L   Potassium 3.8  3.5 - 5.1 mEq/L   Glucose, Bld 112 (*) 70 - 99 mg/dL   HCT 40.9 (*) 81.1 -  52.0 %   Hemoglobin 11.9 (*) 13.0 - 17.0 g/dL  POCT I-STAT 4, (NA,K, GLUC, HGB,HCT)     Status: Abnormal   Collection Time    01/21/13  2:15 PM      Result Value Range   Sodium 135  135 - 145 mEq/L   Potassium 3.7  3.5 - 5.1 mEq/L   Glucose, Bld 109 (*) 70 - 99 mg/dL   HCT 16.1 (*) 09.6 - 04.5 %   Hemoglobin 8.8 (*) 13.0 - 17.0 g/dL  POCT I-STAT 3, BLOOD GAS (G3+)     Status: Abnormal   Collection Time    01/21/13  2:19 PM      Result Value Range   pH, Arterial 7.417  7.350 - 7.450   pCO2 arterial 42.4  35.0 - 45.0 mmHg   pO2, Arterial 341.0 (*) 80.0 - 100.0 mmHg   Bicarbonate 27.3 (*) 20.0 - 24.0 mEq/L   TCO2 29  0 - 100 mmol/L   O2 Saturation 100.0     Acid-Base Excess 2.0  0.0 - 2.0 mmol/L   Sample type ARTERIAL    POCT I-STAT 4, (NA,K, GLUC, HGB,HCT)     Status: Abnormal   Collection Time    01/21/13  3:17 PM      Result Value Range   Sodium 137  135 - 145 mEq/L   Potassium 4.8  3.5 - 5.1 mEq/L   Glucose, Bld 131 (*) 70 - 99 mg/dL   HCT 40.9 (*) 81.1 - 91.4 %   Hemoglobin 9.2 (*) 13.0 - 17.0 g/dL  HEMOGLOBIN AND HEMATOCRIT, BLOOD     Status: Abnormal   Collection Time    01/21/13  3:24 PM      Result Value Range   Hemoglobin 9.7 (*) 13.0 - 17.0 g/dL   HCT 78.2 (*) 95.6 - 21.3 %  PLATELET COUNT     Status: Abnormal   Collection Time    01/21/13  3:24 PM      Result Value Range    Platelets 138 (*) 150 - 400 K/uL  POCT I-STAT 4, (NA,K, GLUC, HGB,HCT)     Status: Abnormal   Collection Time    01/21/13  4:09 PM      Result Value Range   Sodium 138  135 - 145 mEq/L   Potassium 3.9  3.5 - 5.1 mEq/L   Glucose, Bld 170 (*) 70 - 99 mg/dL   HCT 08.6 (*) 57.8 - 46.9 %   Hemoglobin 10.5 (*) 13.0 - 17.0 g/dL  POCT I-STAT 3, BLOOD GAS (G3+)     Status: Abnormal   Collection Time    01/21/13  4:12 PM      Result Value Range   pH, Arterial 7.474 (*) 7.350 - 7.450   pCO2 arterial 36.2  35.0 - 45.0 mmHg   pO2, Arterial 395.0 (*) 80.0 - 100.0 mmHg   Bicarbonate 26.6 (*) 20.0 - 24.0 mEq/L   TCO2 28  0 - 100 mmol/L   O2 Saturation 100.0     Acid-Base Excess 3.0 (*) 0.0 - 2.0 mmol/L   Sample type ARTERIAL    CBC     Status: Abnormal   Collection Time    01/21/13  5:04 PM      Result Value Range   WBC 18.6 (*) 4.0 - 10.5 K/uL   RBC 4.05 (*) 4.22 - 5.81 MIL/uL   Hemoglobin 12.6 (*) 13.0 - 17.0 g/dL   HCT 62.9 (*) 52.8 - 41.3 %  MCV 90.4  78.0 - 100.0 fL   MCH 31.1  26.0 - 34.0 pg   MCHC 34.4  30.0 - 36.0 g/dL   RDW 09.8  11.9 - 14.7 %   Platelets 133 (*) 150 - 400 K/uL  PROTIME-INR     Status: Abnormal   Collection Time    01/21/13  5:04 PM      Result Value Range   Prothrombin Time 17.3 (*) 11.6 - 15.2 seconds   INR 1.46  0.00 - 1.49  APTT     Status: None   Collection Time    01/21/13  5:04 PM      Result Value Range   aPTT 31  24 - 37 seconds  POCT I-STAT 3, BLOOD GAS (G3+)     Status: Abnormal   Collection Time    01/21/13  5:12 PM      Result Value Range   pH, Arterial 7.405  7.350 - 7.450   pCO2 arterial 38.8  35.0 - 45.0 mmHg   pO2, Arterial 137.0 (*) 80.0 - 100.0 mmHg   Bicarbonate 24.5 (*) 20.0 - 24.0 mEq/L   TCO2 26  0 - 100 mmol/L   O2 Saturation 99.0     Patient temperature 36.2 C     Collection site ARTERIAL LINE     Drawn by Nurse     Sample type ARTERIAL    POCT I-STAT 4, (NA,K, GLUC, HGB,HCT)     Status: Abnormal   Collection Time     01/21/13  5:16 PM      Result Value Range   Sodium 139  135 - 145 mEq/L   Potassium 3.3 (*) 3.5 - 5.1 mEq/L   Glucose, Bld 141 (*) 70 - 99 mg/dL   HCT 82.9 (*) 56.2 - 13.0 %   Hemoglobin 11.6 (*) 13.0 - 17.0 g/dL  GLUCOSE, CAPILLARY     Status: None   Collection Time    01/21/13  6:32 PM      Result Value Range   Glucose-Capillary 98  70 - 99 mg/dL  GLUCOSE, CAPILLARY     Status: Abnormal   Collection Time    01/21/13  7:44 PM      Result Value Range   Glucose-Capillary 103 (*) 70 - 99 mg/dL  GLUCOSE, CAPILLARY     Status: None   Collection Time    01/21/13  8:56 PM      Result Value Range   Glucose-Capillary 91  70 - 99 mg/dL  GLUCOSE, CAPILLARY     Status: Abnormal   Collection Time    01/21/13  9:49 PM      Result Value Range   Glucose-Capillary 123 (*) 70 - 99 mg/dL  GLUCOSE, CAPILLARY     Status: Abnormal   Collection Time    01/21/13 10:58 PM      Result Value Range   Glucose-Capillary 126 (*) 70 - 99 mg/dL  CBC     Status: Abnormal   Collection Time    01/21/13 11:18 PM      Result Value Range   WBC 15.3 (*) 4.0 - 10.5 K/uL   RBC 3.31 (*) 4.22 - 5.81 MIL/uL   Hemoglobin 10.0 (*) 13.0 - 17.0 g/dL   HCT 86.5 (*) 78.4 - 69.6 %   MCV 90.3  78.0 - 100.0 fL   MCH 30.2  26.0 - 34.0 pg   MCHC 33.4  30.0 - 36.0 g/dL   RDW 29.5  28.4 - 13.2 %  Platelets 110 (*) 150 - 400 K/uL   Comment: PLATELET COUNT CONFIRMED BY SMEAR  MAGNESIUM     Status: Abnormal   Collection Time    01/21/13 11:18 PM      Result Value Range   Magnesium 2.8 (*) 1.5 - 2.5 mg/dL  CREATININE, SERUM     Status: Abnormal   Collection Time    01/21/13 11:18 PM      Result Value Range   Creatinine, Ser 1.14  0.50 - 1.35 mg/dL   GFR calc non Af Amer 61 (*) >90 mL/min   GFR calc Af Amer 70 (*) >90 mL/min   Comment:            The eGFR has been calculated     using the CKD EPI equation.     This calculation has not been     validated in all clinical     situations.     eGFR's persistently      <90 mL/min signify     possible Chronic Kidney Disease.  POCT I-STAT, CHEM 8     Status: Abnormal   Collection Time    01/21/13 11:19 PM      Result Value Range   Sodium 142  135 - 145 mEq/L   Potassium 4.4  3.5 - 5.1 mEq/L   Chloride 108  96 - 112 mEq/L   BUN 21  6 - 23 mg/dL   Creatinine, Ser 1.61  0.50 - 1.35 mg/dL   Glucose, Bld 096 (*) 70 - 99 mg/dL   Calcium, Ion 0.45 (*) 1.13 - 1.30 mmol/L   TCO2 25  0 - 100 mmol/L   Hemoglobin 9.5 (*) 13.0 - 17.0 g/dL   HCT 40.9 (*) 81.1 - 91.4 %  GLUCOSE, CAPILLARY     Status: Abnormal   Collection Time    01/21/13 11:54 PM      Result Value Range   Glucose-Capillary 125 (*) 70 - 99 mg/dL  POCT I-STAT 3, BLOOD GAS (G3+)     Status: Abnormal   Collection Time    01/22/13 12:21 AM      Result Value Range   pH, Arterial 7.346 (*) 7.350 - 7.450   pCO2 arterial 43.5  35.0 - 45.0 mmHg   pO2, Arterial 137.0 (*) 80.0 - 100.0 mmHg   Bicarbonate 23.9  20.0 - 24.0 mEq/L   TCO2 25  0 - 100 mmol/L   O2 Saturation 99.0     Acid-base deficit 2.0  0.0 - 2.0 mmol/L   Patient temperature 36.6 C     Sample type ARTERIAL    GLUCOSE, CAPILLARY     Status: Abnormal   Collection Time    01/22/13 12:58 AM      Result Value Range   Glucose-Capillary 126 (*) 70 - 99 mg/dL  POCT I-STAT 3, BLOOD GAS (G3+)     Status: Abnormal   Collection Time    01/22/13  1:38 AM      Result Value Range   pH, Arterial 7.316 (*) 7.350 - 7.450   pCO2 arterial 44.3  35.0 - 45.0 mmHg   pO2, Arterial 98.0  80.0 - 100.0 mmHg   Bicarbonate 22.6  20.0 - 24.0 mEq/L   TCO2 24  0 - 100 mmol/L   O2 Saturation 97.0     Acid-base deficit 3.0 (*) 0.0 - 2.0 mmol/L   Patient temperature 36.9 C     Sample type ARTERIAL    GLUCOSE, CAPILLARY  Status: Abnormal   Collection Time    01/22/13  2:02 AM      Result Value Range   Glucose-Capillary 168 (*) 70 - 99 mg/dL  GLUCOSE, CAPILLARY     Status: Abnormal   Collection Time    01/22/13  3:04 AM      Result Value Range    Glucose-Capillary 144 (*) 70 - 99 mg/dL  GLUCOSE, CAPILLARY     Status: Abnormal   Collection Time    01/22/13  3:55 AM      Result Value Range   Glucose-Capillary 123 (*) 70 - 99 mg/dL  CBC     Status: Abnormal   Collection Time    01/22/13  4:03 AM      Result Value Range   WBC 15.0 (*) 4.0 - 10.5 K/uL   RBC 3.41 (*) 4.22 - 5.81 MIL/uL   Hemoglobin 10.6 (*) 13.0 - 17.0 g/dL   HCT 40.9 (*) 81.1 - 91.4 %   MCV 90.9  78.0 - 100.0 fL   MCH 31.1  26.0 - 34.0 pg   MCHC 34.2  30.0 - 36.0 g/dL   RDW 78.2  95.6 - 21.3 %   Platelets 101 (*) 150 - 400 K/uL   Comment: CONSISTENT WITH PREVIOUS RESULT  BASIC METABOLIC PANEL     Status: Abnormal   Collection Time    01/22/13  4:03 AM      Result Value Range   Sodium 139  135 - 145 mEq/L   Potassium 3.9  3.5 - 5.1 mEq/L   Chloride 107  96 - 112 mEq/L   CO2 24  19 - 32 mEq/L   Glucose, Bld 126 (*) 70 - 99 mg/dL   BUN 22  6 - 23 mg/dL   Creatinine, Ser 0.86  0.50 - 1.35 mg/dL   Calcium 8.8  8.4 - 57.8 mg/dL   GFR calc non Af Amer 58 (*) >90 mL/min   GFR calc Af Amer 67 (*) >90 mL/min   Comment:            The eGFR has been calculated     using the CKD EPI equation.     This calculation has not been     validated in all clinical     situations.     eGFR's persistently     <90 mL/min signify     possible Chronic Kidney Disease.  MAGNESIUM     Status: Abnormal   Collection Time    01/22/13  4:03 AM      Result Value Range   Magnesium 2.7 (*) 1.5 - 2.5 mg/dL  GLUCOSE, CAPILLARY     Status: Abnormal   Collection Time    01/22/13  5:02 AM      Result Value Range   Glucose-Capillary 101 (*) 70 - 99 mg/dL  GLUCOSE, CAPILLARY     Status: Abnormal   Collection Time    01/22/13  6:00 AM      Result Value Range   Glucose-Capillary 100 (*) 70 - 99 mg/dL  GLUCOSE, CAPILLARY     Status: Abnormal   Collection Time    01/22/13  7:07 AM      Result Value Range   Glucose-Capillary 108 (*) 70 - 99 mg/dL  GLUCOSE, CAPILLARY     Status: None    Collection Time    01/22/13  8:00 AM      Result Value Range   Glucose-Capillary 98  70 - 99 mg/dL  GLUCOSE, CAPILLARY     Status: Abnormal   Collection Time    01/22/13  9:29 AM      Result Value Range   Glucose-Capillary 101 (*) 70 - 99 mg/dL  GLUCOSE, CAPILLARY     Status: Abnormal   Collection Time    01/22/13 10:35 AM      Result Value Range   Glucose-Capillary 111 (*) 70 - 99 mg/dL  GLUCOSE, CAPILLARY     Status: Abnormal   Collection Time    01/22/13 11:39 AM      Result Value Range   Glucose-Capillary 112 (*) 70 - 99 mg/dL  GLUCOSE, CAPILLARY     Status: Abnormal   Collection Time    01/22/13 12:29 PM      Result Value Range   Glucose-Capillary 123 (*) 70 - 99 mg/dL  GLUCOSE, CAPILLARY     Status: Abnormal   Collection Time    01/22/13 12:45 PM      Result Value Range   Glucose-Capillary 120 (*) 70 - 99 mg/dL  GLUCOSE, CAPILLARY     Status: Abnormal   Collection Time    01/22/13  1:40 PM      Result Value Range   Glucose-Capillary 116 (*) 70 - 99 mg/dL    IMAGING: Dg Chest Portable 1 View In Am  01/22/2013   *RADIOLOGY REPORT*  Clinical Data: Coronary artery bypass  PORTABLE CHEST - 1 VIEW  Comparison: 01/21/2013  Findings: Endotracheal and NG tubes removed.  Chest tubes stable. Low volumes with subsegmental atelectasis bilaterally.  Swan-Ganz catheter retracted to the central right pulmonary artery.  Vascular congestion persist.  No sign of interstitial edema.  Right midlung calcified granuloma.  IMPRESSION: Extubated with increased volume loss.  Mild vascular congestion without interstitial edema.  No pneumothorax.  Chest tubes remain in place.   Original Report Authenticated By: Jolaine Click, M.D.   Dg Chest Portable 1 View  01/21/2013   *RADIOLOGY REPORT*  Clinical Data: Postoperative examination  PORTABLE CHEST - 1 VIEW  Comparison: 01/17/2013; 12/19/2012  Findings:  Grossly unchanged cardiac silhouette and mediastinal contours post median sternotomy CABG.   Endotracheal tube overlies tracheal air column with tip superior to the carina.  Enteric tube tip terminates inferior to the left hemidiaphragm.  Right jugular approach central venous catheter tip overlies the right main pulmonary outflow tract.  Interval placement of mediastinal drain and left-sided chest tube.  No pneumothorax.  Lung volumes are slightly reduced with corresponding worsening perihilar opacities favored to represent atelectasis.  Unchanged granuloma within the peripheral aspect the right mid lung with associated calcified right hilar lymph nodes.  No definite evidence of edema.  Post lower cervical ACDF.  IMPRESSION: 1.  Post median sternotomy and CABG.  Appropriate position support apparatus as above.  No pneumothorax. 2.  Slightly reduced lung volumes with perihilar atelectasis.  No evidence of edema.  3. Sequela of prior granulomatous infection as above.   Original Report Authenticated By: Tacey Ruiz, MD    IMPRESSION: 1. Progressing well postop without evidence of congestive heart failure, perioperative Infarction or arrhythmia. 2. Reportedly roughly 6 kg above preoperative weight; he has minimal visible edema.  RECOMMENDATION: Incentive spirometry, early mobilization, tight glycemic control  Time Spent Directly with Patient: 30 minutes  Thurmon Fair, MD, [facility] [phone] office [phone] pager  01/22/2013, 2:59 PM

## 2013-01-22 NOTE — Progress Notes (Signed)
1 Day Post-Op Procedure(s) (LRB): Coronary Arery Bypass Grafting  Times Five Using Left Internal Mammary Artery and Right Saphenous Leg Vein Harvested Endoscopically (N/A) Subjective: Some incisional pain, no nausea  Objective: Vital signs in last 24 hours: Temp:  [96.4 F (35.8 C)-98.6 F (37 C)] 98.4 F (36.9 C) (06/17 0800) Pulse Rate:  [78-101] 89 (06/17 0800) Cardiac Rhythm:  [-] Atrial paced (06/17 0800) Resp:  [8-26] 21 (06/17 0800) BP: (90-177)/(39-106) 110/54 mmHg (06/17 0800) SpO2:  [93 %-100 %] 95 % (06/17 0800) Arterial Line BP: (93-159)/(38-73) 137/52 mmHg (06/17 0800) FiO2 (%):  [40 %-50 %] 40 % (06/16 2358) Weight:  [193 lb 5.5 oz (87.7 kg)] 193 lb 5.5 oz (87.7 kg) (06/17 0500)  Hemodynamic parameters for last 24 hours: PAP: (16-45)/(6-24) 36/20 mmHg CO:  [2.6 L/min-5.4 L/min] 4.6 L/min CI:  [1.3 L/min/m2-2.7 L/min/m2] 2.3 L/min/m2  Intake/Output from previous day: 06/16 0701 - 06/17 0700 In: 6601.9 [I.V.:4376.9; Blood:445; NG/GT:60; IV Piggyback:1720] Out: 3435 [Urine:2195; Emesis/NG output:100; Blood:850; Chest Tube:290] Intake/Output this shift: Total I/O In: 102.4 [I.V.:52.4; IV Piggyback:50] Out: 60 [Urine:40; Chest Tube:20]  General appearance: alert and no distress Neurologic: intact Heart: regular rate and rhythm and friction rub heard - Lungs: diminished breath sounds bibasilar Abdomen: normal findings: soft, non-tender  Lab Results:  Recent Labs  01/21/13 2318 01/21/13 2319 01/22/13 0403  WBC 15.3*  --  15.0*  HGB 10.0* 9.5* 10.6*  HCT 29.9* 28.0* 31.0*  PLT 110*  --  101*   BMET:  Recent Labs  01/21/13 2319 01/22/13 0403  NA 142 139  K 4.4 3.9  CL 108 107  CO2  --  24  GLUCOSE 126* 126*  BUN 21 22  CREATININE 1.30 1.18  CALCIUM  --  8.8    PT/INR:  Recent Labs  01/21/13 1704  LABPROT 17.3*  INR 1.46   ABG    Component Value Date/Time   PHART 7.316* 01/22/2013 0138   HCO3 22.6 01/22/2013 0138   TCO2 24 01/22/2013 0138    ACIDBASEDEF 3.0* 01/22/2013 0138   O2SAT 97.0 01/22/2013 0138   CBG (last 3)   Recent Labs  01/22/13 0304 01/22/13 0355 01/22/13 0502  GLUCAP 144* 123* 101*    Assessment/Plan: S/P Procedure(s) (LRB): Coronary Arery Bypass Grafting  Times Five Using Left Internal Mammary Artery and Right Saphenous Leg Vein Harvested Endoscopically (N/A) POD # 1 CABG x 5 CV- good index, SR- dc swan  RESP- pulmonary hygiene  RENAL- lytes, creatinine OK- volume overload - diurese  ENDO- CBG well controlled- transition to levimir and SSI  Anemia secondary to ABL- mild, follow  DC CT  OOB   LOS: 1 day    Charles [name] C 01/22/2013

## 2013-01-23 ENCOUNTER — Inpatient Hospital Stay (HOSPITAL_COMMUNITY): Payer: Medicare Other

## 2013-01-23 DIAGNOSIS — I4949 Other premature depolarization: Secondary | ICD-10-CM

## 2013-01-23 DIAGNOSIS — I25810 Atherosclerosis of coronary artery bypass graft(s) without angina pectoris: Secondary | ICD-10-CM

## 2013-01-23 LAB — GLUCOSE, CAPILLARY
Glucose-Capillary: 109 mg/dL — ABNORMAL HIGH (ref 70–99)
Glucose-Capillary: 135 mg/dL — ABNORMAL HIGH (ref 70–99)

## 2013-01-23 LAB — BASIC METABOLIC PANEL
BUN: 29 mg/dL — ABNORMAL HIGH (ref 6–23)
Chloride: 104 mEq/L (ref 96–112)
Creatinine, Ser: 1.3 mg/dL (ref 0.50–1.35)
GFR calc Af Amer: 60 mL/min — ABNORMAL LOW (ref >90)
Glucose, Bld: 117 mg/dL — ABNORMAL HIGH (ref 70–99)
Potassium: 4 mEq/L (ref 3.5–5.1)
Sodium: 139 mEq/L (ref 135–145)

## 2013-01-23 LAB — CBC
HCT: 33.7 % — ABNORMAL LOW (ref 39.0–52.0)
MCH: 31 pg (ref 26.0–34.0)
MCHC: 33.5 g/dL (ref 30.0–36.0)
MCV: 92.3 fL (ref 78.0–100.0)
RDW: 13.7 % (ref 11.5–15.5)

## 2013-01-23 MED ORDER — SODIUM CHLORIDE 0.9 % IJ SOLN
3.0000 mL | Freq: Two times a day (BID) | INTRAMUSCULAR | Status: DC
  Administered 2013-01-23 – 2013-01-24 (×3): 3 mL via INTRAVENOUS

## 2013-01-23 MED ORDER — SODIUM CHLORIDE 0.9 % IJ SOLN
3.0000 mL | INTRAMUSCULAR | Status: DC | PRN
  Administered 2013-01-23: 3 mL via INTRAVENOUS

## 2013-01-23 MED ORDER — ZOLPIDEM TARTRATE 5 MG PO TABS
5.0000 mg | ORAL_TABLET | Freq: Every evening | ORAL | Status: DC | PRN
  Administered 2013-01-24: 5 mg via ORAL
  Filled 2013-01-23: qty 1

## 2013-01-23 MED ORDER — FUROSEMIDE 40 MG PO TABS
40.0000 mg | ORAL_TABLET | Freq: Every day | ORAL | Status: DC
  Administered 2013-01-23 – 2013-01-25 (×3): 40 mg via ORAL
  Filled 2013-01-23 (×3): qty 1

## 2013-01-23 MED ORDER — MOVING RIGHT ALONG BOOK
Freq: Once | Status: AC
  Administered 2013-01-23: 09:00:00
  Filled 2013-01-23: qty 1

## 2013-01-23 MED ORDER — ALUM & MAG HYDROXIDE-SIMETH 200-200-20 MG/5ML PO SUSP: 15.0000 mL | ORAL | Status: DC | PRN

## 2013-01-23 MED ORDER — METOPROLOL TARTRATE 25 MG/10 ML ORAL SUSPENSION
25.0000 mg | Freq: Two times a day (BID) | ORAL | Status: DC
  Filled 2013-01-23 (×3): qty 10

## 2013-01-23 MED ORDER — POTASSIUM CHLORIDE CRYS ER 20 MEQ PO TBCR
20.0000 meq | EXTENDED_RELEASE_TABLET | Freq: Two times a day (BID) | ORAL | Status: DC
  Administered 2013-01-23 – 2013-01-25 (×5): 20 meq via ORAL
  Filled 2013-01-23 (×5): qty 1

## 2013-01-23 MED ORDER — SODIUM CHLORIDE 0.9 % IV SOLN: 250.0000 mL | INTRAVENOUS | Status: DC | PRN

## 2013-01-23 MED ORDER — GUAIFENESIN-DM 100-10 MG/5ML PO SYRP
15.0000 mL | ORAL_SOLUTION | ORAL | Status: DC | PRN
  Administered 2013-01-25: 15 mL via ORAL
  Filled 2013-01-23: qty 15

## 2013-01-23 MED ORDER — MAGNESIUM HYDROXIDE 400 MG/5ML PO SUSP: 30.0000 mL | Freq: Every day | ORAL | Status: DC | PRN

## 2013-01-23 MED ORDER — METOPROLOL TARTRATE 25 MG PO TABS
25.0000 mg | ORAL_TABLET | Freq: Two times a day (BID) | ORAL | Status: DC
  Administered 2013-01-23: 25 mg via ORAL
  Filled 2013-01-23 (×3): qty 1

## 2013-01-23 MED ORDER — INSULIN ASPART 100 UNIT/ML ~~LOC~~ SOLN
0.0000 [IU] | Freq: Three times a day (TID) | SUBCUTANEOUS | Status: DC
  Administered 2013-01-24: 2 [IU] via SUBCUTANEOUS

## 2013-01-23 NOTE — Progress Notes (Signed)
2 Days Post-Op Procedure(s) (LRB): Coronary Arery Bypass Grafting  Times Five Using Left Internal Mammary Artery and Right Saphenous Leg Vein Harvested Endoscopically (N/A) Subjective: Feels better this AM  Objective: Vital signs in last 24 hours: Temp:  [98 F (36.7 C)-99.3 F (37.4 C)] 98.4 F (36.9 C) (06/18 0737) Pulse Rate:  [81-107] 93 (06/18 0800) Cardiac Rhythm:  [-] Normal sinus rhythm (06/18 0800) Resp:  [12-26] 15 (06/18 0800) BP: (103-160)/(31-115) 141/67 mmHg (06/18 0800) SpO2:  [87 %-98 %] 98 % (06/18 0800) Weight:  [188 lb 11.4 oz (85.6 kg)] 188 lb 11.4 oz (85.6 kg) (06/18 0400)  Hemodynamic parameters for last 24 hours:    Intake/Output from previous day: 06/17 0701 - 06/18 0700 In: 979.9 [P.O.:180; I.V.:499.9; IV Piggyback:300] Out: 2300 [Urine:2280; Chest Tube:20] Intake/Output this shift: Total I/O In: 60 [I.V.:10; IV Piggyback:50] Out: 125 [Urine:125]  General appearance: alert and no distress Neurologic: intact Heart: regular rate and rhythm Lungs: diminished breath sounds bibasilar Abdomen: normal findings: soft, non-tender  Lab Results:  Recent Labs  01/22/13 1600 01/22/13 1651 01/23/13 0410  WBC 20.7*  --  19.4*  HGB 12.1* 12.2* 11.3*  HCT 35.4* 36.0* 33.7*  PLT 153  --  125*   BMET:  Recent Labs  01/22/13 0403  01/22/13 1651 01/23/13 0410  NA 139  --  140 139  K 3.9  --  4.3 4.0  CL 107  --  106 104  CO2 24  --   --  27  GLUCOSE 126*  --  145* 117*  BUN 22  --  25* 29*  CREATININE 1.18  < > 1.40* 1.30  CALCIUM 8.8  --   --  9.1  < > = values in this interval not displayed.  PT/INR:  Recent Labs  01/21/13 1704  LABPROT 17.3*  INR 1.46   ABG    Component Value Date/Time   PHART 7.316* 01/22/2013 0138   HCO3 22.6 01/22/2013 0138   TCO2 25 01/22/2013 1651   ACIDBASEDEF 3.0* 01/22/2013 0138   O2SAT 97.0 01/22/2013 0138   CBG (last 3)   Recent Labs  01/22/13 2339 01/23/13 0355 01/23/13 0735  GLUCAP 133* 103* 109*     Assessment/Plan: S/P Procedure(s) (LRB): Coronary Arery Bypass Grafting  Times Five Using Left Internal Mammary Artery and Right Saphenous Leg Vein Harvested Endoscopically (N/A) Plan for transfer to step-down: see transfer orders  POD # 2- doing well  CV- stable, SR  RESP- continue pulmonary hygiene for bibasilar atelectasis  RENAL- lytes, creatinine OK, 9 lbs above preop- diurese  ENDO- CBG well controlled change to [facility] and HS   LOS: 2 days    Herald Vallin C 01/23/2013

## 2013-01-23 NOTE — Progress Notes (Addendum)
Pt ambulated to room 2035 on 2 liters [place].  Pt positioned comfortably in bed with accepting RN and NT in room.  Vitals remained stable throughout transfer. Report given to receiving RN.

## 2013-01-23 NOTE — Progress Notes (Signed)
[facility]  Subjective: Some incisional chest pain. No SOB. He is using incentive spirometer.  Objective: Vital signs in last 24 hours: Temp:  [98 F (36.7 C)-99.3 F (37.4 C)] 98.4 F (36.9 C) (06/18 0737) Pulse Rate:  [81-108] 94 (06/18 1000) Resp:  [12-26] 22 (06/18 1000) BP: (103-160)/(31-115) 147/76 mmHg (06/18 1000) SpO2:  [87 %-98 %] 94 % (06/18 1000) Weight:  [188 lb 11.4 oz (85.6 kg)] 188 lb 11.4 oz (85.6 kg) (06/18 0400)    Intake/Output from previous day: 06/17 0701 - 06/18 0700 In: 979.9 [P.O.:180; I.V.:499.9; IV Piggyback:300] Out: 2300 [Urine:2280; Chest Tube:20] Intake/Output this shift: Total I/O In: 60 [I.V.:10; IV Piggyback:50] Out: 225 [Urine:225]  Medications Current Facility-Administered Medications  Medication Dose Route Frequency Provider Last Rate Last Dose  . 0.9 %  sodium chloride infusion  250 mL Intravenous PRN Loreli Slot, MD      . acetaminophen (TYLENOL) tablet 1,000 mg  1,000 mg Oral Q6H Donielle Margaretann Loveless, PA-C   1,000 mg at 01/23/13 0010   Or  . acetaminophen (TYLENOL) solution 975 mg  975 mg Per Tube Q6H Donielle Margaretann Loveless, PA-C      . alum & mag hydroxide-simeth (MAALOX/MYLANTA) 200-200-20 MG/5ML suspension 15-30 mL  15-30 mL Oral Q4H PRN Loreli Slot, MD      . aspirin EC tablet 325 mg  325 mg Oral Daily Donielle Margaretann Loveless, PA-C   325 mg at 01/23/13 0930   Or  . aspirin chewable tablet 324 mg  324 mg Per Tube Daily Donielle Margaretann Loveless, PA-C      . bisacodyl (DULCOLAX) EC tablet 10 mg  10 mg Oral Daily Ardelle Balls, PA-C   10 mg at 01/23/13 0930   Or  . bisacodyl (DULCOLAX) suppository 10 mg  10 mg Rectal Daily Donielle Margaretann Loveless, PA-C      . cyclobenzaprine (FLEXERIL) tablet 5 mg  5 mg Oral TID PRN Loreli Slot, MD   5 mg at 01/23/13 0742  . docusate sodium (COLACE) capsule 200 mg  200 mg Oral Daily Donielle Margaretann Loveless, PA-C   200 mg at 01/23/13 0930  .  enoxaparin (LOVENOX) injection 40 mg  40 mg Subcutaneous QHS Loreli Slot, MD   40 mg at 01/22/13 2226  . finasteride (PROSCAR) tablet 5 mg  5 mg Oral Daily Donielle Margaretann Loveless, PA-C   5 mg at 01/23/13 0930  . furosemide (LASIX) tablet 40 mg  40 mg Oral Daily Loreli Slot, MD   40 mg at 01/23/13 0930  . guaiFENesin-dextromethorphan (ROBITUSSIN DM) 100-10 MG/5ML syrup 15 mL  15 mL Oral Q4H PRN Loreli Slot, MD      . insulin aspart (novoLOG) injection 0-15 Units  0-15 Units Subcutaneous TID WC Loreli Slot, MD      . LORazepam (ATIVAN) tablet 0.5 mg  0.5 mg Oral Q6H PRN Loreli Slot, MD   0.5 mg at 01/23/13 0009  . magnesium hydroxide (MILK OF MAGNESIA) suspension 30 mL  30 mL Oral Daily PRN Loreli Slot, MD      . metoprolol tartrate (LOPRESSOR) tablet 25 mg  25 mg Oral BID Loreli Slot, MD       Or  . metoprolol tartrate (LOPRESSOR) 25 mg/10 mL oral suspension 25 mg  25 mg Per Tube BID Loreli Slot, MD      . moving right along book   Does not apply Once Viviann Spare  Lars Pinks, MD      . mupirocin ointment (BACTROBAN) 2 % 1 application  1 application Nasal BID Loreli Slot, MD   1 application at 01/23/13 0930  . ondansetron (ZOFRAN) injection 4 mg  4 mg Intravenous Q6H PRN Ardelle Balls, PA-C   4 mg at 01/22/13 1424  . oxyCODONE (Oxy IR/ROXICODONE) immediate release tablet 5-10 mg  5-10 mg Oral Q3H PRN Ardelle Balls, PA-C   10 mg at 01/23/13 0742  . pantoprazole (PROTONIX) EC tablet 40 mg  40 mg Oral Daily Donielle Margaretann Loveless, PA-C   40 mg at 01/23/13 0930  . potassium chloride SA (K-DUR,KLOR-CON) CR tablet 20 mEq  20 mEq Oral BID Loreli Slot, MD   20 mEq at 01/23/13 0930  . sodium chloride 0.9 % injection 3 mL  3 mL Intravenous Q12H Loreli Slot, MD      . sodium chloride 0.9 % injection 3 mL  3 mL Intravenous PRN Loreli Slot, MD      . zolpidem (AMBIEN) tablet 5 mg  5 mg Oral QHS PRN  Loreli Slot, MD        PE: General appearance: alert, cooperative and no distress Lungs: clear to auscultation bilaterally and decreased BS at the bases Heart: regular rate and rhythm; 1/6 sem and friction rub heard at the apex Extremities: no LEE Pulses: 2+ and symmetric Skin: warm and dry Neurologic: Grossly normal  Lab Results:   Recent Labs  01/22/13 0403 01/22/13 1600 01/22/13 1651 01/23/13 0410  WBC 15.0* 20.7*  --  19.4*  HGB 10.6* 12.1* 12.2* 11.3*  HCT 31.0* 35.4* 36.0* 33.7*  PLT 101* 153  --  125*   BMET  Recent Labs  01/22/13 0403 01/22/13 1600 01/22/13 1651 01/23/13 0410  NA 139  --  140 139  K 3.9  --  4.3 4.0  CL 107  --  106 104  CO2 24  --   --  27  GLUCOSE 126*  --  145* 117*  BUN 22  --  25* 29*  CREATININE 1.18 1.36* 1.40* 1.30  CALCIUM 8.8  --   --  9.1   PT/INR  Recent Labs  01/21/13 1704  LABPROT 17.3*  INR 1.46    Assessment/Plan  Principal Problem:   CAD- S/P CABG x 5 (LIMA-LAD, SVG-ramus intermedius, SVG-OM1, SVG to distal RCA, SVG-PDA) 01/21/13 Active Problems:   DIABETES MELLITUS, TYPE II   HYPERLIPIDEMIA   HYPERTENSION  Plan: S/P CABG x 5 (left internal mammary artery to left anterior descending, sequential saphenous vein graft to ramus intermedius, and obtuse marginal 1, sequential saphenous vein graft to distal right coronary and posterior descending). POD #2.  Progressing well. Pt has ? Friction rub, best heard near the apex. He is hemodynamically stable and w/o significant chest pain or SOB. If pt later develops symptoms, ? Obtaining an echo. CT remains in place. Pt also noted to have frequent PVCs and brief runs of NSVT on telemetry. He has been asymptomatic. May consider increasing BB dose. BP is stable and should tolerate increase. Will continue to follow.    LOS: 2 days    Brittainy M. Delmer Islam 01/23/2013 10:31 AM   Patient seen and examined. Agree with assessment and plan. Feeling better today.  Continue diuresis; weight today 188, decreased by 5 from yesterday, but still 9 lbs increase from pre-op. Now on lopressor 25 mg bid, increased from 12.5. Monitor rhythm. Just transferred to 2000.  Lennette Bihari, MD, [facility] 01/23/2013 1:13 PM

## 2013-01-23 NOTE — Progress Notes (Signed)
[id number] Came to walk with pt. Would like to eat lunch now. Pt off his oxygen and sats at 83%RA. Put pt on 3L to get sats to above 90%. Daughter in room. Pt confused. Assisted to reclilner and set up for lunch. Put chair alarm in chair. Notified pt's RN that family would like to see her. Will follow up later to walk. Luetta Nutting RNBSN

## 2013-01-23 NOTE — Progress Notes (Signed)
CARDIAC REHAB PHASE I   PRE:  Rate/Rhythm: 103 ST  BP:  Supine:   Sitting: 130/70  Standing:    SaO2: 88-90 3L  MODE:  Ambulation: 450 ft   POST:  Rate/Rhythm: 108 ST  BP:  Supine:   Sitting: 152/70  Standing:    SaO2: 90-91 3L 1425-1445 Assisted X 2 used walker, gait belt and O2 3L to ambulate. Gait steady with walker, pt c/o of being SOB with walking. He took one standung rest stop in hall due to SOB. O2 sat after walk on 3L 90-91%. Pt back to recliner after walk with call light in reach, chair alarm on and daughter in room.   Melina Copa RN 01/23/2013 2:44 PM

## 2013-01-23 NOTE — Progress Notes (Signed)
Pt non compliant with sternal precautions.  Pt educated on sternal precautions using Teach Back method.  Pt able to state the meaning of sternal precautions and the reasoning behind it.  Will continue to encourage patient to use sternal precautions.

## 2013-01-24 ENCOUNTER — Other Ambulatory Visit: Payer: Self-pay | Admitting: *Deleted

## 2013-01-24 DIAGNOSIS — R609 Edema, unspecified: Secondary | ICD-10-CM

## 2013-01-24 DIAGNOSIS — I2510 Atherosclerotic heart disease of native coronary artery without angina pectoris: Secondary | ICD-10-CM

## 2013-01-24 LAB — CBC
Hemoglobin: 11.5 g/dL — ABNORMAL LOW (ref 13.0–17.0)
MCH: 30.7 pg (ref 26.0–34.0)
MCV: 92.3 fL (ref 78.0–100.0)
RBC: 3.75 MIL/uL — ABNORMAL LOW (ref 4.22–5.81)

## 2013-01-24 LAB — BASIC METABOLIC PANEL
CO2: 32 mEq/L (ref 19–32)
Calcium: 9.5 mg/dL (ref 8.4–10.5)
Creatinine, Ser: 1.21 mg/dL (ref 0.50–1.35)
Glucose, Bld: 112 mg/dL — ABNORMAL HIGH (ref 70–99)
Sodium: 144 mEq/L (ref 135–145)

## 2013-01-24 MED ORDER — METOPROLOL TARTRATE 25 MG PO TABS
25.0000 mg | ORAL_TABLET | Freq: Three times a day (TID) | ORAL | Status: DC
  Administered 2013-01-24 (×3): 25 mg via ORAL
  Filled 2013-01-24 (×6): qty 1

## 2013-01-24 MED ORDER — LOSARTAN POTASSIUM 25 MG PO TABS
25.0000 mg | ORAL_TABLET | Freq: Two times a day (BID) | ORAL | Status: DC
  Administered 2013-01-24 – 2013-01-25 (×3): 25 mg via ORAL
  Filled 2013-01-24 (×6): qty 1

## 2013-01-24 MED ORDER — DIPHENHYDRAMINE HCL 25 MG PO CAPS
25.0000 mg | ORAL_CAPSULE | Freq: Every day | ORAL | Status: DC
  Administered 2013-01-24: 25 mg via ORAL
  Filled 2013-01-24 (×2): qty 1

## 2013-01-24 MED ORDER — TRIAMTERENE-HCTZ 37.5-25 MG PO CAPS
1.0000 | ORAL_CAPSULE | Freq: Every day | ORAL | Status: DC
  Administered 2013-01-24 – 2013-01-25 (×2): 1 via ORAL
  Filled 2013-01-24 (×2): qty 1

## 2013-01-24 NOTE — Discharge Summary (Signed)
Physician Discharge Summary       [address] [place]       [place] [address]             [phone]    Patient ID: Charles [name] MRN: [id number] DOB/AGE: 14-Sep-1934 78 y.o.  Admit date: 01/21/2013 Discharge date: 01/25/2013  Admission Diagnoses: 1. Multivessel CAD 2.History of hypertension 3.History of hyperlipidemia 4. History of DM 5.History of TIA 6.History tobacco abuse 7.History of macular degeneration  Discharge Diagnoses:  1. Multivessel CAD 2.History of hypertension 3.History of hyperlipidemia 4. History of DM 5.History of TIA 6.History tobacco abuse 7.History of macular degeneration 8.Mild thrombocytopenia 9.Mild ABL anemia  Procedure (s):  Median sternotomy, extracorporeal circulation, coronary  artery bypass grafting x5 (left internal mammary artery to left anterior descending, sequential saphenous vein graft to ramus intermedius, and obtuse marginal 1, sequential saphenous vein graft to distal right coronary and posterior descending), endoscopic vein harvest right leg by Dr. Cornelius Moras on 01/21/2013.  History of Presenting Illness: This is a 78 year old male with multiple cardiac risk factors presents with chief complaint of chest pain with exertion.  Charles [name] is a 78 year old gentleman with a history of hypertension, hyperlipidemia, and type 2 diabetes (diet controlled). He first noted tightness and pressure in his chest with exertion back in January. He had been walking about 4 miles a day. He began to have chest pressure towards the end of his walks. Over time, that progressed to earlier and earlier in his walking. He attributed this to bronchitis and the cold weather and just cut back back on his exercise. A few weeks ago, he tried to resume his walking; however, he began experiencing chest tightness and pressure once again. He saw Dr. Drue Novel who thought this was angina. A stress test was performed and showed evidence of inferior ischemia. He then  underwent cardiac catheterization by Dr. Royann Shivers and was found to have left main and three-vessel coronary disease. He has well-preserved ventricular function with ejection fraction 65%. He has not had any rest or nocturnal symptoms. He was seen by Dr. Dorris Fetch for the consideration of coronary artery bypass grafting surgery. Potential risks, complications, and benefits of the surgery were discussed with the patient and he agreed to proceed. He underwent a CABG x 5 on 01/21/2013.   Brief Hospital Course:  The patient was extubated early the morning of post operative day one without difficulty. He remained afebrile and hemodynamically stable. [name], a line, chest tubes, and foley were removed early in the post operative course. Lopressor was started and titrated accordingly. He was volume over loaded and diuresed. He was weaned off the insulin drip. He is a diet controlled diabetic. The patient's HGA1C pre op was 6.4. He will require further surveillance as an outpatient.The patient was felt surgically stable for transfer from the ICU to PCTU for further convalescence on 01/23/2013. He continues to progress with cardiac rehab. He was ambulating on room air. He has been tolerating a diet and has had a bowel movement. He was hypertensive and was put on Cozaar. Epicardial pacing wires and chest tube sutures will be removed prior to discharge. Provided the patient remains afebrile, hemodynamically stable, and pending morning round evaluation, he will be surgically stable for discharge on 01/25/2013.   Latest Vital Signs: Blood pressure 131/64, pulse 86, temperature 98.7 F (37.1 C), temperature source Oral, resp. rate 19, height 5\' 11"  (1.803 m), weight 85.2 kg (187 lb 13.3 oz), SpO2 90.00%.  Physical Exam: Cardiovascular: RRR,  no murmurs, gallops, or rubs.  Pulmonary: Diminished at bases R>L; no rales, wheezes, or rhonchi.  Abdomen: Soft, non tender, bowel sounds present.  Extremities: Mild  bilateral lower extremity edema.  Wounds: Clean and dry. No erythema or signs of infection.   Discharge Condition:Stable  Recent laboratory studies:  Lab Results  Component Value Date   WBC 12.9* 01/24/2013   HGB 11.5* 01/24/2013   HCT 34.6* 01/24/2013   MCV 92.3 01/24/2013   PLT 129* 01/24/2013   Lab Results  Component Value Date   NA 139 01/25/2013   K 3.9 01/25/2013   CL 103 01/25/2013   CO2 28 01/25/2013   CREATININE 1.22 01/25/2013   GLUCOSE 109* 01/25/2013      Diagnostic Studies:  Dg Chest Port 1 View  01/23/2013   *RADIOLOGY REPORT*  Clinical Data: Postop cardiac surgery.  PORTABLE CHEST - 1 VIEW  Comparison: 01/22/2013  Findings: Swan-Ganz catheter has been removed.  The chest drains have been removed.  Right jugular central line is still present. No evidence for a pneumothorax.  Bibasilar densities are suggestive for atelectasis.  Median sternotomy wires are present.  IMPRESSION: Bibasilar atelectasis.  Negative for a pneumothorax.  Support apparatuses as described.   Original Report Authenticated By: Richarda Overlie, M.D.         Future Appointments Provider Department Dept Phone   02/19/2013 12:00 PM Loreli Slot, MD [facility] [phone]   02/20/2013 11:30 AM Thurmon Fair, MD [facility] [place] [phone]      Discharge Medications:   Medication List    STOP taking these medications       ibuprofen 200 MG tablet  Commonly known as:  ADVIL,MOTRIN     nitroGLYCERIN 0.4 MG SL tablet  Commonly known as:  NITROSTAT     triamterene-hydrochlorothiazide 37.5-25 MG per capsule  Commonly known as:  DYAZIDE      TAKE these medications       aspirin 325 MG EC tablet  Take 1 tablet (325 mg total) by mouth daily.     finasteride 5 MG tablet  Commonly known as:  PROSCAR  Take 5 mg by mouth daily.     furosemide 40 MG tablet  Commonly known as:  LASIX  Take 1 tablet (40 mg total) by  mouth daily. For one week then stop     KRILL OIL 1000 MG Caps  Take 1,000 mg by mouth daily.     losartan 50 MG tablet  Commonly known as:  COZAAR  Take 1 tablet (50 mg total) by mouth daily.     metoprolol tartrate 25 MG tablet  Commonly known as:  LOPRESSOR  Take 1.5 tablets (37.5 mg total) by mouth 2 (two) times daily.     OVER THE COUNTER MEDICATION  Take 1 tablet by mouth daily. Macular degeneration vitamin.     oxyCODONE 5 MG immediate release tablet  Commonly known as:  Oxy IR/ROXICODONE  Take 1 tablet (5 mg total) by mouth every 4 (four) hours as needed for pain.     potassium chloride SA 20 MEQ tablet  Commonly known as:  K-DUR,KLOR-CON  Take 1 tablet (20 mEq total) by mouth daily.     Vitamin D3 2000 UNITS Tabs  Take 2,000 Units by mouth daily.        The patient has been discharged on:   1.Beta Blocker:  Yes [ x  ]  No   [   ]                              If No, reason:  2.Ace Inhibitor/ARB: Yes [ x  ]                                     No  [    ]:  3.Statin:   Yes [  ]                  No  [x   ]                  If No, reason:Allergic  4.Marlowe Kays:  Yes  [  x ]                  No   [   ]                  If No, reason:  Follow Up Appointments: Follow-up Information   Follow up with Thurmon Fair, MD On 02/20/2013. (11:30AM.)    Contact information:   [address] [place] [address] [phone]       Follow up with Loreli Slot, MD. (PA/LAT CXR to be taken (at [facility] which is in the same building as Dr. Sunday Corn office) on 02/19/2013 at 11:15 am;Appointment with Dr. Dorris Fetch is on 02/19/2013 at 12:00 pm)    Contact information:   [address] [employer] [address] [place] [address] [phone]       Follow up with [name], MD. (Call for follow up regarding further surveillance of HGA1C 6.4)    Contact information:   [address] [facility] [address] [place]  [place] [address] [phone]       Signed: Doree Fudge MPA-C 01/25/2013, 8:26 AM

## 2013-01-24 NOTE — Progress Notes (Signed)
[facility]  Subjective: He feels that he is progressing well. No complaints.  Objective: Vital signs in last 24 hours: Temp:  [98.5 F (36.9 C)] 98.5 F (36.9 C) (06/19 0443) Pulse Rate:  [84-96] 84 (06/19 0443) Resp:  [19-22] 20 (06/19 0443) BP: (142-165)/(74-86) 158/75 mmHg (06/19 0443) SpO2:  [90 %-98 %] 90 % (06/19 0443) Weight:  [188 lb 4.4 oz (85.4 kg)] 188 lb 4.4 oz (85.4 kg) (06/19 0443) Last BM Date: 01/23/13  Intake/Output from previous day: 06/18 0701 - 06/19 0700 In: 300 [P.O.:240; I.V.:10; IV Piggyback:50] Out: 1000 [Urine:1000] Intake/Output this shift: Total I/O In: 480 [P.O.:480] Out: -   Medications Current Facility-Administered Medications  Medication Dose Route Frequency Provider Last Rate Last Dose  . 0.9 %  sodium chloride infusion  250 mL Intravenous PRN Loreli Slot, MD      . acetaminophen (TYLENOL) tablet 1,000 mg  1,000 mg Oral Q6H Donielle Margaretann Loveless, PA-C   1,000 mg at 01/23/13 2138   Or  . acetaminophen (TYLENOL) solution 975 mg  975 mg Per Tube Q6H Ardelle Balls, PA-C   975 mg at 01/24/13 0651  . alum & mag hydroxide-simeth (MAALOX/MYLANTA) 200-200-20 MG/5ML suspension 15-30 mL  15-30 mL Oral Q4H PRN Loreli Slot, MD      . aspirin EC tablet 325 mg  325 mg Oral Daily Donielle Margaretann Loveless, PA-C   325 mg at 01/23/13 0930   Or  . aspirin chewable tablet 324 mg  324 mg Per Tube Daily Donielle Margaretann Loveless, PA-C      . bisacodyl (DULCOLAX) EC tablet 10 mg  10 mg Oral Daily Donielle Margaretann Loveless, PA-C   10 mg at 01/23/13 0930   Or  . bisacodyl (DULCOLAX) suppository 10 mg  10 mg Rectal Daily Donielle Margaretann Loveless, PA-C      . cyclobenzaprine (FLEXERIL) tablet 5 mg  5 mg Oral TID PRN Loreli Slot, MD   5 mg at 01/23/13 1610  . diphenhydrAMINE (BENADRYL) capsule 25 mg  25 mg Oral QHS Loreli Slot, MD      . docusate sodium (COLACE) capsule 200 mg  200 mg Oral Daily Donielle Margaretann Loveless, PA-C   200 mg at 01/23/13 0930  . enoxaparin (LOVENOX) injection 40 mg  40 mg Subcutaneous QHS Loreli Slot, MD   40 mg at 01/23/13 2132  . finasteride (PROSCAR) tablet 5 mg  5 mg Oral Daily Donielle Margaretann Loveless, PA-C   5 mg at 01/23/13 0930  . furosemide (LASIX) tablet 40 mg  40 mg Oral Daily Loreli Slot, MD   40 mg at 01/23/13 0930  . guaiFENesin-dextromethorphan (ROBITUSSIN DM) 100-10 MG/5ML syrup 15 mL  15 mL Oral Q4H PRN Loreli Slot, MD      . insulin aspart (novoLOG) injection 0-15 Units  0-15 Units Subcutaneous TID WC Loreli Slot, MD      . LORazepam (ATIVAN) tablet 0.5 mg  0.5 mg Oral Q6H PRN Loreli Slot, MD   0.5 mg at 01/23/13 0009  . magnesium hydroxide (MILK OF MAGNESIA) suspension 30 mL  30 mL Oral Daily PRN Loreli Slot, MD      . metoprolol tartrate (LOPRESSOR) tablet 25 mg  25 mg Oral TID Ardelle Balls, PA-C      . mupirocin ointment (BACTROBAN) 2 % 1 application  1 application Nasal BID Loreli Slot, MD   1 application at 01/23/13 2133  .  ondansetron (ZOFRAN) injection 4 mg  4 mg Intravenous Q6H PRN Ardelle Balls, PA-C   4 mg at 01/22/13 1424  . oxyCODONE (Oxy IR/ROXICODONE) immediate release tablet 5-10 mg  5-10 mg Oral Q3H PRN Ardelle Balls, PA-C   10 mg at 01/23/13 0742  . pantoprazole (PROTONIX) EC tablet 40 mg  40 mg Oral Daily Donielle Margaretann Loveless, PA-C   40 mg at 01/23/13 0930  . potassium chloride SA (K-DUR,KLOR-CON) CR tablet 20 mEq  20 mEq Oral BID Loreli Slot, MD   20 mEq at 01/24/13 0036  . sodium chloride 0.9 % injection 3 mL  3 mL Intravenous Q12H Loreli Slot, MD   3 mL at 01/23/13 2135  . sodium chloride 0.9 % injection 3 mL  3 mL Intravenous PRN Loreli Slot, MD   3 mL at 01/23/13 2135  . zolpidem (AMBIEN) tablet 5 mg  5 mg Oral QHS PRN Loreli Slot, MD        PE: General appearance: alert, cooperative and no distress Lungs: clear to  auscultation bilaterally Heart: regular rate and rhythm and friction rub heard near apex Extremities: trace bilateral LEE Pulses: 2+ and symmetric Skin: warm and dry Neurologic: Grossly normal  Lab Results:   Recent Labs  01/22/13 1600 01/22/13 1651 01/23/13 0410 01/24/13 0455  WBC 20.7*  --  19.4* 12.9*  HGB 12.1* 12.2* 11.3* 11.5*  HCT 35.4* 36.0* 33.7* 34.6*  PLT 153  --  125* 129*   BMET  Recent Labs  01/22/13 0403  01/22/13 1651 01/23/13 0410 01/24/13 0455  NA 139  --  140 139 144  K 3.9  --  4.3 4.0 4.5  CL 107  --  106 104 106  CO2 24  --   --  27 32  GLUCOSE 126*  --  145* 117* 112*  BUN 22  --  25* 29* 29*  CREATININE 1.18  < > 1.40* 1.30 1.21  CALCIUM 8.8  --   --  9.1 9.5  < > = values in this interval not displayed. PT/INR  Recent Labs  01/21/13 1704  LABPROT 17.3*  INR 1.46   Filed Weights   01/22/13 0500 01/23/13 0400 01/24/13 0443  Weight: 193 lb 5.5 oz (87.7 kg) 188 lb 11.4 oz (85.6 kg) 188 lb 4.4 oz (85.4 kg)    Assessment/Plan  Principal Problem:   CAD- S/P CABG x 5 (LIMA-LAD, SVG-ramus intermedius, SVG-OM1, SVG to distal RCA, SVG-PDA) 01/21/13 Active Problems:   DIABETES MELLITUS, TYPE II   HYPERLIPIDEMIA   HYPERTENSION  Plan: S/P CABG x 5. POD#3. Progressing well. Now on telemetry floor. Chest pain free. He has been ambulating with cardiac rehab with little difficulty. He has friction rub on exam but is hemodynamically stable. No post-operative arrhthymias noted on telemetry. His Lopressor was increased to TID for better BP and rate control by TCTS. I agree with up titration. Will monitor response. Renal function is WNL today with SCr of 1.21. Can consider low dose ACE-I/ARB. Continue with Lasix for diuresis.  Will continue to follow.     LOS: 3 days    Brittainy M. Delmer Islam 01/24/2013 9:59 AM     Patient seen and examined. Agree with assessment and plan. Day 3 s/p CABG. I/O -700 yesterday, + 480 today. Stll up 9 lbs from  pre-op weight.  Notes cough. Will initiate ARB with DM, CAD; continue diuresis. Cardiac Rehab.   Lennette Bihari, MD, [facility] 01/24/2013 10:44  AM

## 2013-01-24 NOTE — Progress Notes (Signed)
CARDIAC REHAB PHASE I   PRE:  Rate/Rhythm: 100SR  BP:  Supine:   Sitting: 100/70  Standing:    SaO2: 92%RA  MODE:  Ambulation: 890 ft   POST:  Rate/Rhythm: 101  BP:  Supine:   Sitting: 168/72  Standing:    SaO2: 90-91%RA 1000-1025 Pt clearer today. Not confused. Walked 890 ft on RA with hand held asst x 2. Did not want to use the walker. Tolerated well. Wants to go home tomorrow. Can be asst x 1. To chair with chair alarm.    Luetta Nutting, RN BSN  01/24/2013 10:22 AM

## 2013-01-24 NOTE — Progress Notes (Addendum)
      [address]       [place] [address]             [phone]        3 Days Post-Op Procedure(s) (LRB): Coronary Arery Bypass Grafting  Times Five Using Left Internal Mammary Artery and Right Saphenous Leg Vein Harvested Endoscopically (N/A)  Subjective: Patient not sleeping. He wants to go home as soon as possible  Objective: Vital signs in last 24 hours: Temp:  [98.5 F (36.9 C)] 98.5 F (36.9 C) (06/19 0443) Pulse Rate:  [84-108] 84 (06/19 0443) Cardiac Rhythm:  [-] Normal sinus rhythm (06/19 0802) Resp:  [19-23] 20 (06/19 0443) BP: (124-165)/(74-107) 158/75 mmHg (06/19 0443) SpO2:  [90 %-98 %] 90 % (06/19 0443) Weight:  [85.4 kg (188 lb 4.4 oz)] 85.4 kg (188 lb 4.4 oz) (06/19 0443)  Pre op weight 81 kg Current Weight  01/24/13 85.4 kg (188 lb 4.4 oz)      Intake/Output from previous day: 06/18 0701 - 06/19 0700 In: 300 [P.O.:240; I.V.:10; IV Piggyback:50] Out: 1000 [Urine:1000]   Physical Exam:  Cardiovascular: RRR, no murmurs, gallops, or rubs. Pulmonary: Diminished at bases R>L; no rales, wheezes, or rhonchi. Abdomen: Soft, non tender, bowel sounds present. Extremities: Mild bilateral lower extremity edema. Wounds: Clean and dry.  No erythema or signs of infection.  Lab Results: CBC: Recent Labs  01/23/13 0410 01/24/13 0455  WBC 19.4* 12.9*  HGB 11.3* 11.5*  HCT 33.7* 34.6*  PLT 125* 129*   BMET:  Recent Labs  01/23/13 0410 01/24/13 0455  NA 139 144  K 4.0 4.5  CL 104 106  CO2 27 32  GLUCOSE 117* 112*  BUN 29* 29*  CREATININE 1.30 1.21  CALCIUM 9.1 9.5    PT/INR:  Lab Results  Component Value Date   INR 1.46 01/21/2013   INR 1.07 01/17/2013   INR 1.02 01/07/2013   ABG:  INR: Will add last result for INR, ABG once components are confirmed Will add last 4 CBG results once components are confirmed  Assessment/Plan:  1. CV - ST/SR. Hypertension.On Lopressor 25 bid. Will increase to tid for better HR and blood  pressure control. May need ACE or ARB for more blood pressure control 2.  Pulmonary - On 2 liters of oxygen via [place]-wean as tolerates.Encourage incentive spirometer 3. Volume Overload - On Lasix 40 daily 4.  Acute blood loss anemia - H and H stable at 11.5 and 34.6 5.Thrombocytopenia-platelets slightly increased to 129,000 6.DM-CBGs 109/135/140. Patient states he has been diet controlled. 7.Remove EPW in am 8.Benadryl at night for sleep (patient's request) 9.Possible home in 1-2 days  ZIMMERMAN,DONIELLE MPA-C 01/24/2013,8:19 AM  Looks great. Probably home in AM

## 2013-01-25 DIAGNOSIS — I10 Essential (primary) hypertension: Secondary | ICD-10-CM

## 2013-01-25 DIAGNOSIS — I2510 Atherosclerotic heart disease of native coronary artery without angina pectoris: Secondary | ICD-10-CM

## 2013-01-25 LAB — BASIC METABOLIC PANEL
BUN: 24 mg/dL — ABNORMAL HIGH (ref 6–23)
CO2: 28 mEq/L (ref 19–32)
GFR calc non Af Amer: 56 mL/min — ABNORMAL LOW (ref >90)
Glucose, Bld: 109 mg/dL — ABNORMAL HIGH (ref 70–99)
Potassium: 3.9 mEq/L (ref 3.5–5.1)

## 2013-01-25 LAB — GLUCOSE, CAPILLARY

## 2013-01-25 MED ORDER — METOPROLOL TARTRATE 25 MG PO TABS: 37.5000 mg | ORAL_TABLET | Freq: Two times a day (BID) | ORAL | Status: DC

## 2013-01-25 MED ORDER — LOSARTAN POTASSIUM 50 MG PO TABS: 50.0000 mg | ORAL_TABLET | Freq: Every day | ORAL | Status: DC

## 2013-01-25 MED ORDER — METOPROLOL TARTRATE 12.5 MG HALF TABLET: 37.5000 mg | ORAL_TABLET | Freq: Two times a day (BID) | ORAL | Status: DC

## 2013-01-25 MED ORDER — OXYCODONE HCL 5 MG PO TABS: 5.0000 mg | ORAL_TABLET | ORAL | Status: DC | PRN

## 2013-01-25 MED ORDER — FUROSEMIDE 40 MG PO TABS: 40.0000 mg | ORAL_TABLET | Freq: Every day | ORAL | Status: DC

## 2013-01-25 MED ORDER — POTASSIUM CHLORIDE CRYS ER 20 MEQ PO TBCR
20.0000 meq | EXTENDED_RELEASE_TABLET | Freq: Once | ORAL | Status: AC
  Administered 2013-01-25: 20 meq via ORAL
  Filled 2013-01-25: qty 1

## 2013-01-25 MED ORDER — METOPROLOL TARTRATE 25 MG PO TABS
37.5000 mg | ORAL_TABLET | Freq: Two times a day (BID) | ORAL | Status: DC
  Administered 2013-01-25: 37.5 mg via ORAL
  Filled 2013-01-25 (×2): qty 1

## 2013-01-25 MED ORDER — ASPIRIN 325 MG PO TBEC: 325.0000 mg | DELAYED_RELEASE_TABLET | Freq: Every day | ORAL | Status: DC

## 2013-01-25 MED ORDER — POTASSIUM CHLORIDE CRYS ER 20 MEQ PO TBCR: 20.0000 meq | EXTENDED_RELEASE_TABLET | Freq: Every day | ORAL | Status: DC

## 2013-01-25 NOTE — Care Management Note (Signed)
    Page 1 of 1   01/25/2013     4:38:31 PM   CARE MANAGEMENT NOTE 01/25/2013  Patient:  Charles [name], Charles [name]   Account Number:  [id number]  Date Initiated:  01/25/2013  Documentation initiated by:  Alika Eppes  Subjective/Objective Assessment:   PT S/P CABG X5 ON 01/21/13.  PTA, PT INDEPENDENT, LIVES WITH SPOUSE.     Action/Plan:   WIFE TO PROVIDE 24HR CARE AT DC.  NO HOME NEEDS.   Anticipated DC Date:  01/25/2013   Anticipated DC Plan:  HOME/SELF CARE      DC Planning Services  CM consult      Choice offered to / List presented to:             Status of service:  Completed, signed off Medicare Important Message given?   (If response is "NO", the following Medicare IM given date fields will be blank) Date Medicare IM given:   Date Additional Medicare IM given:    Discharge Disposition:  HOME/SELF CARE  Per UR Regulation:  Reviewed for med. necessity/level of care/duration of stay  If discussed at Long Length of Stay Meetings, dates discussed:    Comments:

## 2013-01-25 NOTE — Progress Notes (Addendum)
      [address]       [place] [address]             [phone]        4 Days Post-Op Procedure(s) (LRB): Coronary Arery Bypass Grafting  Times Five Using Left Internal Mammary Artery and Right Saphenous Leg Vein Harvested Endoscopically (N/A)  Subjective: Patient really wants to go home  Objective: Vital signs in last 24 hours: Temp:  [98.7 F (37.1 C)-99.4 F (37.4 C)] 98.7 F (37.1 C) (06/20 0433) Pulse Rate:  [86-93] 86 (06/20 0433) Cardiac Rhythm:  [-] Sinus tachycardia (06/20 0801) Resp:  [18-19] 19 (06/20 0433) BP: (131-156)/(64-78) 131/64 mmHg (06/20 0433) SpO2:  [90 %-96 %] 90 % (06/20 0433) Weight:  [85.2 kg (187 lb 13.3 oz)] 85.2 kg (187 lb 13.3 oz) (06/20 0433)  Pre op weight 81 kg Current Weight  01/25/13 85.2 kg (187 lb 13.3 oz)      Intake/Output from previous day: 06/19 0701 - 06/20 0700 In: 960 [P.O.:960] Out: -    Physical Exam:  Cardiovascular: RRR, no murmurs, gallops, or rubs. Pulmonary: Slightly diminished at bases R>L; no rales, wheezes, or rhonchi. Abdomen: Soft, non tender, bowel sounds present. Extremities: Mild bilateral lower extremity edema. Wounds: RLE wound is clean and dry.  No erythema or signs of infection. Sternal wound has trace serosanguinous ooze from distal incision, but no erythema  Lab Results: CBC:  Recent Labs  01/23/13 0410 01/24/13 0455  WBC 19.4* 12.9*  HGB 11.3* 11.5*  HCT 33.7* 34.6*  PLT 125* 129*   BMET:   Recent Labs  01/24/13 0455 01/25/13 0620  NA 144 139  K 4.5 3.9  CL 106 103  CO2 32 28  GLUCOSE 112* 109*  BUN 29* 24*  CREATININE 1.21 1.22  CALCIUM 9.5 9.3    PT/INR:  Lab Results  Component Value Date   INR 1.46 01/21/2013   INR 1.07 01/17/2013   INR 1.02 01/07/2013   ABG:  INR: Will add last result for INR, ABG once components are confirmed Will add last 4 CBG results once components are confirmed  Assessment/Plan:  1. CV - SR. On Lopressor 37.5 bid,  Cozaar 25 bid, and Dyazide 37.5/25 daily 2.  Pulmonary -Weaned off of oxygen.Encourage incentive spirometer 3. Volume Overload - On Lasix 40 daily. He is still 4-5 pounds above pre op weight 4.  Acute blood loss anemia - H and H stable at 11.5 and 34.6 5.Thrombocytopenia-platelets slightly increased to 129,000 6.DM-CBGs 135/140/109. Patient states he has been diet controlled. 7.Remove EPW 8.Benadryl at night for sleep (patient's request) 9.Trace serosanguinous ooze from distal sternal wound. No erythema. Monitor 10.Creatinine stable at 1.22 11.Likely home later today   Charles [name] MPA-C 01/25/2013,8:12 AM

## 2013-01-25 NOTE — Progress Notes (Signed)
CARDIAC REHAB PHASE I   PRE:  Rate/Rhythm: 92SR  BP:  Supine: 142/70  Sitting:   Standing:    SaO2:   MODE:  Ambulation: 790 ft   POST:  Rate/Rhythm: 116  BP:  Supine:   Sitting: 160/70  Standing:    SaO2: 98%RA 1045-1140 Pt walked 790 ft with hand held asst. Tolerated well. Education completed with pt and daughter. Discussed CRP 2 and permission given to refer to [facility] Phase 2. Put on discharge video for pt and family to view.   Luetta Nutting, RN BSN  01/25/2013 11:37 AM

## 2013-01-25 NOTE — Progress Notes (Signed)
Subjective: Doing well other than a dry cough which he had prior to coming to the hospital.  Objective: Vital signs in last 24 hours: Temp:  [98.7 F (37.1 C)-99.4 F (37.4 C)] 98.7 F (37.1 C) (06/20 0433) Pulse Rate:  [86-93] 86 (06/20 0433) Resp:  [18-19] 19 (06/20 0433) BP: (131-156)/(64-78) 131/64 mmHg (06/20 0433) SpO2:  [90 %-96 %] 90 % (06/20 0433) Weight:  [187 lb 13.3 oz (85.2 kg)] 187 lb 13.3 oz (85.2 kg) (06/20 0433) Last BM Date: 01/24/13  Intake/Output from previous day: 06/19 0701 - 06/20 0700 In: 960 [P.O.:960] Out: -  Intake/Output this shift: Total I/O In: 480 [P.O.:480] Out: -   Medications Current Facility-Administered Medications  Medication Dose Route Frequency Provider Last Rate Last Dose  . 0.9 %  sodium chloride infusion  250 mL Intravenous PRN Loreli Slot, MD      . acetaminophen (TYLENOL) tablet 1,000 mg  1,000 mg Oral Q6H Donielle Margaretann Loveless, PA-C   1,000 mg at 01/23/13 2138   Or  . acetaminophen (TYLENOL) solution 975 mg  975 mg Per Tube Q6H Ardelle Balls, PA-C   975 mg at 01/24/13 0651  . alum & mag hydroxide-simeth (MAALOX/MYLANTA) 200-200-20 MG/5ML suspension 15-30 mL  15-30 mL Oral Q4H PRN Loreli Slot, MD      . aspirin EC tablet 325 mg  325 mg Oral Daily Donielle Margaretann Loveless, PA-C   325 mg at 01/24/13 1031   Or  . aspirin chewable tablet 324 mg  324 mg Per Tube Daily Donielle Margaretann Loveless, PA-C      . bisacodyl (DULCOLAX) EC tablet 10 mg  10 mg Oral Daily Donielle Margaretann Loveless, PA-C   10 mg at 01/23/13 0930   Or  . bisacodyl (DULCOLAX) suppository 10 mg  10 mg Rectal Daily Donielle Margaretann Loveless, PA-C      . cyclobenzaprine (FLEXERIL) tablet 5 mg  5 mg Oral TID PRN Loreli Slot, MD   5 mg at 01/23/13 1610  . diphenhydrAMINE (BENADRYL) capsule 25 mg  25 mg Oral QHS Loreli Slot, MD   25 mg at 01/24/13 2110  . docusate sodium (COLACE) capsule 200 mg  200 mg Oral Daily Ardelle Balls, PA-C   200  mg at 01/24/13 1031  . enoxaparin (LOVENOX) injection 40 mg  40 mg Subcutaneous QHS Loreli Slot, MD   40 mg at 01/24/13 2111  . finasteride (PROSCAR) tablet 5 mg  5 mg Oral Daily Donielle Margaretann Loveless, PA-C   5 mg at 01/24/13 1032  . furosemide (LASIX) tablet 40 mg  40 mg Oral Daily Loreli Slot, MD   40 mg at 01/24/13 1033  . guaiFENesin-dextromethorphan (ROBITUSSIN DM) 100-10 MG/5ML syrup 15 mL  15 mL Oral Q4H PRN Loreli Slot, MD   15 mL at 01/25/13 0200  . insulin aspart (novoLOG) injection 0-15 Units  0-15 Units Subcutaneous TID WC Loreli Slot, MD   2 Units at 01/24/13 1715  . LORazepam (ATIVAN) tablet 0.5 mg  0.5 mg Oral Q6H PRN Loreli Slot, MD   0.5 mg at 01/23/13 0009  . losartan (COZAAR) tablet 25 mg  25 mg Oral BID Lennette Bihari, MD   25 mg at 01/24/13 2110  . magnesium hydroxide (MILK OF MAGNESIA) suspension 30 mL  30 mL Oral Daily PRN Loreli Slot, MD      . metoprolol tartrate (LOPRESSOR) tablet 37.5 mg  37.5 mg Oral BID  Donielle Margaretann Loveless, PA-C      . mupirocin ointment (BACTROBAN) 2 % 1 application  1 application Nasal BID Loreli Slot, MD   1 application at 01/24/13 2110  . ondansetron (ZOFRAN) injection 4 mg  4 mg Intravenous Q6H PRN Ardelle Balls, PA-C   4 mg at 01/22/13 1424  . oxyCODONE (Oxy IR/ROXICODONE) immediate release tablet 5-10 mg  5-10 mg Oral Q3H PRN Ardelle Balls, PA-C   5 mg at 01/25/13 0914  . pantoprazole (PROTONIX) EC tablet 40 mg  40 mg Oral Daily Donielle Margaretann Loveless, PA-C   40 mg at 01/23/13 0930  . potassium chloride SA (K-DUR,KLOR-CON) CR tablet 20 mEq  20 mEq Oral BID Loreli Slot, MD   20 mEq at 01/24/13 2111  . potassium chloride SA (K-DUR,KLOR-CON) CR tablet 20 mEq  20 mEq Oral Once Donielle M Zimmerman, PA-C      . sodium chloride 0.9 % injection 3 mL  3 mL Intravenous Q12H Loreli Slot, MD   3 mL at 01/24/13 2111  . sodium chloride 0.9 % injection 3 mL  3 mL  Intravenous PRN Loreli Slot, MD   3 mL at 01/23/13 2135  . triamterene-hydrochlorothiazide (DYAZIDE) 37.5-25 MG per capsule 1 each  1 each Oral Daily Loreli Slot, MD   1 each at 01/24/13 1719  . zolpidem (AMBIEN) tablet 5 mg  5 mg Oral QHS PRN Loreli Slot, MD   5 mg at 01/24/13 2110    PE: General appearance: alert, cooperative and no distress Lungs: clear to auscultation bilaterally Heart: regular rate and rhythm, S1, S2 normal, no murmur, click, rub or gallop Extremities: No LEE\ Pulses: 2+ and symmetric Neurologic: Grossly normal  Lab Results:   Recent Labs  01/22/13 1600 01/22/13 1651 01/23/13 0410 01/24/13 0455  WBC 20.7*  --  19.4* 12.9*  HGB 12.1* 12.2* 11.3* 11.5*  HCT 35.4* 36.0* 33.7* 34.6*  PLT 153  --  125* 129*   BMET  Recent Labs  01/23/13 0410 01/24/13 0455 01/25/13 0620  NA 139 144 139  K 4.0 4.5 3.9  CL 104 106 103  CO2 27 32 28  GLUCOSE 117* 112* 109*  BUN 29* 29* 24*  CREATININE 1.30 1.21 1.22  CALCIUM 9.1 9.5 9.3    Assessment/Plan  Principal Problem:   CAD- S/P CABG x 5 (LIMA-LAD, SVG-ramus intermedius, SVG-OM1, SVG to distal RCA, SVG-PDA) 01/21/13 Active Problems:   DIABETES MELLITUS, TYPE II   HYPERLIPIDEMIA   HYPERTENSION  Plan:  POD #4 CABG x 5.   NSR in the 90's with RBBB.  ASA, lasix 40mg  daily, Cozaar, lopressor 37.5, Dyazide.   He has a FU appt with Dr. Royann Shivers in two weeks.   LOS: 4 days    HAGER, BRYAN 01/25/2013 10:26 AM  I have seen and examined the patient along with Wilburt Finlay, PA.  I have reviewed the chart, notes and new data.  I agree with PA/NP's note.  Uncomplicated course p CABG. Will follow up in office. Thurmon Fair, MD, [facility] [phone] 01/25/2013, 5:30 PM

## 2013-01-25 NOTE — Progress Notes (Signed)
Discharged to home with family office visits in place teaching done  

## 2013-02-04 ENCOUNTER — Telehealth: Payer: Self-pay | Admitting: Cardiovascular Disease

## 2013-02-04 NOTE — Telephone Encounter (Signed)
Please call BP dropped over the week-end! Was advised to call  Here this morning!

## 2013-02-04 NOTE — Telephone Encounter (Signed)
Returned call.  Pt stated his BP med was changed when he was in the hospital.  Stated his BP is fluctuating.  Stated the lowest it has gotten is 102/64 and he did not take the lopressor.  Stated he felt a little light-headed w/ the BP.    BP Readings (Sat) 121/66, 136/67, 122/63, 107/63 (Sun) 136/75, 104/60, 102/64 ---> told to hold lopressor by another MD (today) 140/79 ---> took lopressor  Pt informed BPs are okay, a few elevated. Advised to continue meds as directed.  Pt advised to hold meds and call if BP <100/60 and having symptoms.  Pt verbalized understanding and agreed w/ plan.

## 2013-02-04 NOTE — Telephone Encounter (Signed)
Returned call to home number x 2.  Call to [facility], [place] Gammon and left message for pt to call back before 4pm.

## 2013-02-04 NOTE — Telephone Encounter (Signed)
Returning your call. °

## 2013-02-15 ENCOUNTER — Other Ambulatory Visit: Payer: Self-pay | Admitting: *Deleted

## 2013-02-15 DIAGNOSIS — I2510 Atherosclerotic heart disease of native coronary artery without angina pectoris: Secondary | ICD-10-CM

## 2013-02-19 ENCOUNTER — Ambulatory Visit (INDEPENDENT_AMBULATORY_CARE_PROVIDER_SITE_OTHER): Payer: Self-pay | Admitting: Thoracic Surgery (Cardiothoracic Vascular Surgery)

## 2013-02-19 ENCOUNTER — Encounter: Payer: Self-pay | Admitting: Thoracic Surgery (Cardiothoracic Vascular Surgery)

## 2013-02-19 ENCOUNTER — Ambulatory Visit
Admission: RE | Admit: 2013-02-19 | Discharge: 2013-02-19 | Disposition: A | Discharge status: Home / Self Care | Payer: Medicare Other | Source: Ambulatory Visit | Attending: Thoracic Surgery (Cardiothoracic Vascular Surgery) | Admitting: Thoracic Surgery (Cardiothoracic Vascular Surgery)

## 2013-02-19 VITALS — BP 149/78 | HR 67 | Resp 20 | Ht 71.0 in | Wt 177.0 lb

## 2013-02-19 DIAGNOSIS — I2510 Atherosclerotic heart disease of native coronary artery without angina pectoris: Secondary | ICD-10-CM

## 2013-02-19 DIAGNOSIS — Z951 Presence of aortocoronary bypass graft: Secondary | ICD-10-CM

## 2013-02-19 NOTE — Progress Notes (Addendum)
HPI:  Charles [name] returns today for a scheduled postoperative followup visit.  He had left main and three-vessel disease and underwent coronary bypass grafting x5 on June 16. His postoperative course was a concave and he was discharged home on postoperative day #4. Since discharge he had some difficulty with his blood pressure and. His medication doses have been adjusted. He is only taking metoprolol once a day rather than twice a day. Since that change was made he's not had any further problems with low blood pressure.  He had an adverse reaction to oxycodone causing hallucinations and anxiety. He's been taking only Tylenol for pain.  He complains of a sharp pain at the left sternal clavicular joint when he reaches down. He also complains of a sharp pain attached to the right of his midsternum. He's not having any shortness of breath. He's not having anginal pain. His appetite is slowly improving. He is having some difficulty with sleeping. He gets to sleep okay but he'll wake up in the middle of the night and then has difficulty getting back to sleep.  His exercise tolerance is good and overall he feels well.  Past Medical History  Diagnosis Date  . Hypertension   . Hyperlipidemia     diet controlled, statins and zetia intolerant  . Retinopathy     mild glaucomea  . Transient ischemic attack 1999  . Histoplasmosis   . Chronic cough 08/2009    -allergy profile- june 2,2011- neg. -Max GERD rx/ off oils January 07 2010 x 3 weeks only but no benefit. - Sinus CT rec July 11,2011- refused due to claustrophobia -MCT neg for asthma 03/01/10 -Add 1st Gen H1 July 11,2011- better  . Macular degeneration   . PONV (postoperative nausea and vomiting)     extreme nausea  . Stroke     tia  . Anginal pain   . Coronary artery disease   . Cough   . Diabetes mellitus     no meds      Current Outpatient Prescriptions  Medication Sig Dispense Refill  . acetaminophen (TYLENOL) 325 MG tablet Take 650 mg  by mouth every 6 (six) hours as needed for pain.      Marland Kitchen aspirin EC 325 MG EC tablet Take 1 tablet (325 mg total) by mouth daily.  30 tablet  0  . Cholecalciferol (VITAMIN D3) 2000 UNITS TABS Take 2,000 Units by mouth daily.      . finasteride (PROSCAR) 5 MG tablet Take 5 mg by mouth daily.      Marland Kitchen KRILL OIL 1000 MG CAPS Take 1,000 mg by mouth daily.       Marland Kitchen losartan (COZAAR) 50 MG tablet Take 1 tablet (50 mg total) by mouth daily.  30 tablet  1  . metoprolol tartrate (LOPRESSOR) 25 MG tablet Take 1.5 tablets (37.5 mg total) by mouth 2 (two) times daily.  90 tablet  1  . OVER THE COUNTER MEDICATION Take 1 tablet by mouth daily. Macular degeneration vitamin.       No current facility-administered medications for this visit.    Physical Exam BP 149/78  Pulse 67  Resp 20  Ht 5\' 11"  (1.803 m)  Wt 177 lb (80.287 kg)  BMI 24.7 kg/m2  SpO80 34% 78 year old male in no acute distress Neurologic alert and oriented x3 with no focal deficits Cardiac regular rate and rhythm normal S1 and S2 no rubs or murmurs or gallops Sternum stable, incision well-healed, point tenderness at left sternal clavicular  joint and right fourth costal cartilage Leg incision well-healed, no peripheral edema Lungs clear with equal breath sounds bilaterally  Diagnostic Tests: Chest x-ray 02/19/2013 *RADIOLOGY REPORT*  Clinical Data: Status post CABG in June 2014. Left-sided chest  pain.  CHEST - 2 VIEW  Comparison: Chest x-ray 01/23/2013.  Findings: There is a persistent small left pleural effusion. No  acute consolidative airspace disease. No evidence of pulmonary  edema. Heart size is normal. Mediastinal contours are  unremarkable. Status post median sternotomy for CABG. Well-  defined high density nodule in the lateral aspect of the right mid  hemithorax is unchanged and compatible with a calcified granuloma.  Orthopedic fixation hardware in the lower cervical spine is  incompletely visualized. Multiple midline  abdominal staples.  IMPRESSION:  1. Persistent small left pleural effusion.  2. No other radiographic evidence of acute cardiopulmonary  disease.  3. Postoperative changes, as above.  Original Report Authenticated By: Trudie Reed, M.D.  Impression: 78 year old gentleman who is now about a month out from coronary bypass grafting x5 left main and three-vessel disease. He is doing extremely well. He does have some pain in his left sternoclavicular joint and also has some right-sided costochondritis. He might benefit from nonsteroidal anti-inflammatories for that but says he has been told not to take those due to possible interaction with losartan. He does not want to use narcotics. I told him he could continue to use Tylenol as necessary and this pain should improve over time.  His exercise tolerance is good and continues to improve. I think he is ready for cardiac rehabilitation.  He will try Tylenol PM for his sleep to see if that helps at all. I think his sleep disturbance should improve over the next couple of weeks. If he continues to have difficulty sleeping beyond that consider a proper sleep aid, but I would like to avoid that if possible.  He will see Dr. Royann Shivers tomorrow.  Plan: He will continue to be followed by Dr. Royann Shivers his cardiac issues. I will be happy to see him back any time if I can be of any further assistance with his care.  I did instruct him to not lift anything over 10 pounds for another 2 weeks. After that he may gradually increase his use of his upper body. He may begin chipping and cutting at that time but should not take any full golf swings until 8 weeks after surgery. He may begin driving, appropriate precautions were discussed.

## 2013-02-20 ENCOUNTER — Encounter: Payer: Self-pay | Admitting: Cardiovascular Disease

## 2013-02-20 ENCOUNTER — Ambulatory Visit (INDEPENDENT_AMBULATORY_CARE_PROVIDER_SITE_OTHER): Payer: Medicare Other | Admitting: Cardiovascular Disease

## 2013-02-20 VITALS — BP 116/68 | HR 57 | Resp 16 | Ht 71.0 in | Wt 176.5 lb

## 2013-02-20 DIAGNOSIS — E782 Mixed hyperlipidemia: Secondary | ICD-10-CM

## 2013-02-20 DIAGNOSIS — I2510 Atherosclerotic heart disease of native coronary artery without angina pectoris: Secondary | ICD-10-CM

## 2013-02-20 DIAGNOSIS — Z79899 Other long term (current) drug therapy: Secondary | ICD-10-CM

## 2013-02-20 DIAGNOSIS — I25810 Atherosclerosis of coronary artery bypass graft(s) without angina pectoris: Secondary | ICD-10-CM

## 2013-02-20 MED ORDER — PRAVASTATIN SODIUM 40 MG PO TABS: 40.0000 mg | ORAL_TABLET | Freq: Every evening | ORAL | Status: DC

## 2013-02-20 MED ORDER — LOSARTAN POTASSIUM 50 MG PO TABS: 50.0000 mg | ORAL_TABLET | Freq: Every day | ORAL | Status: DC

## 2013-02-20 NOTE — Assessment & Plan Note (Signed)
Charles [name] has recovered quite well following bypass surgery. He has clinical and x-ray evidence of a small pleural effusion but has no respiratory difficulty. He remains in sinus rhythm today. By clinical exam he appears to be mildly anemic. His last hemoglobin was 11. I think he is ready to start cardiac rehabilitation. We reviewed the fact that bypass surgery is not curative procedure and that attention to risk factor modification needs to be redoubled to prevent the need for future revascularization procedures. He is eager to start a daily program of exercise and voluntarily stated that [facility] as [facility] graduates from cardiac rehabilitation. We need to find some way to bring his LDL cholesterol down to less than 100 (preferably less than 70) but he has a history of intolerance to statins in the past. He remembers taking atorvastatin for sure but is uncertain which other statins he was intolerance to. I think we will start with a low dose of pravastatin , and coenzyme Q10 and reevaluate his lipid profile in 3 months

## 2013-02-20 NOTE — Progress Notes (Signed)
Patient ID: Charles [name], male   DOB: [birth year], 78 y.o.   MRN: [id number]      Reason for office visit CAD status post recent CABG  Mr. Pellerito is now roughly 1 month status post 5 vessel bypass surgery performed by Dr. Dorris Fetch. He has recovered quite well following bypass surgery although he still is troubled by musculoskeletal complaints suggestive of costochondritis. He is eager to begin cardiac rehabilitation. A chest x-ray yesterday showed a small residual left pleural effusion. He does not have any respiratory difficulty.    Allergies  Allergen Reactions  . Atorvastatin     REACTION: myalgia  . Ezetimibe     REACTION: dizzy  . Lovastatin     REACTION: weakness  . Percocet (Oxycodone-Acetaminophen) Other (See Comments)    hallucinations    Current Outpatient Prescriptions  Medication Sig Dispense Refill  . acetaminophen (TYLENOL) 325 MG tablet Take 650 mg by mouth every 6 (six) hours as needed for pain.      Marland Kitchen aspirin EC 325 MG EC tablet Take 1 tablet (325 mg total) by mouth daily.  30 tablet  0  . Cholecalciferol (VITAMIN D3) 2000 UNITS TABS Take 2,000 Units by mouth daily.      Marland Kitchen co-enzyme Q-10 30 MG capsule Take 30 mg by mouth daily.      . finasteride (PROSCAR) 5 MG tablet Take 5 mg by mouth daily.      Marland Kitchen KRILL OIL 1000 MG CAPS Take 1,000 mg by mouth daily.       Marland Kitchen losartan (COZAAR) 50 MG tablet Take 1 tablet (50 mg total) by mouth daily.  90 tablet  3  . metoprolol tartrate (LOPRESSOR) 25 MG tablet Take 1.5 tablets (37.5 mg total) by mouth 2 (two) times daily.  90 tablet  1  . OVER THE COUNTER MEDICATION Take 1 tablet by mouth daily. Macular degeneration vitamin.      . pravastatin (PRAVACHOL) 40 MG tablet Take 1 tablet (40 mg total) by mouth every evening.  30 tablet  6   No current facility-administered medications for this visit.    Past Medical History  Diagnosis Date  . Hypertension   . Hyperlipidemia     diet controlled, statins and zetia intolerant    . Retinopathy     mild glaucomea  . Transient ischemic attack 1999  . Histoplasmosis   . Chronic cough 08/2009    -allergy profile- june 2,2011- neg. -Max GERD rx/ off oils January 07 2010 x 3 weeks only but no benefit. - Sinus CT rec July 11,2011- refused due to claustrophobia -MCT neg for asthma 03/01/10 -Add 1st Gen H1 July 11,2011- better  . Macular degeneration   . PONV (postoperative nausea and vomiting)     extreme nausea  . Stroke     tia  . Anginal pain   . Coronary artery disease   . Cough   . Diabetes mellitus     no meds    Past Surgical History  Procedure Laterality Date  . Cervical spine surgery  2002    plate  . Cholecystectomy  1985  . Coronary artery bypass graft N/A 01/21/2013    Procedure: Coronary Arery Bypass Grafting  Times Five Using Left Internal Mammary Artery and Right Saphenous Leg Vein Harvested Endoscopically;  Surgeon: Loreli Slot, MD;  Location: [facility] OR;  Service: Open Heart Surgery;  Laterality: N/A;    Family History  Problem Relation Age of Onset  . Coronary artery disease Neg  Hx   . Stroke Neg Hx   . Colon cancer Neg Hx   . Prostate cancer Neg Hx   . Atopy Neg Hx   . Asthma Neg Hx   . Hypertension Mother   . Hypertension Father   . Diabetes Father   . Cancer Father     colon  . Hypertension Brother   . Cancer Brother     lung  . Hypertension Sister   . Cancer Sister     lung  . Diabetes Brother   . Diabetes Sister   . Diabetes      Grandparents   . Lung cancer Brother   . Lung cancer Sister     History   Social History  . Marital Status: Married    Spouse Name: N/A    Number of Children: 2  . Years of Education: N/A   Occupational History  . reitred    Social History Main Topics  . Smoking status: Former Smoker -- 1.00 packs/day for 10 years    Types: Cigarettes    Quit date: 01/17/1977  . Smokeless tobacco: Never Used     Comment: Stopped at age 32   . Alcohol Use: Yes     Comment: seldom  . Drug Use: No   . Sexually Active: Not on file   Other Topics Concern  . Not on file   Social History Narrative  . No narrative on file    Review of systems: The patient specifically denies any anginal chest pain at rest or with exertion, dyspnea at rest or with exertion, orthopnea, paroxysmal nocturnal dyspnea, syncope, palpitations, focal neurological deficits, intermittent claudication, lower extremity edema, unexplained weight gain, cough, hemoptysis or wheezing.  The patient also denies abdominal pain, nausea, vomiting, dysphagia, diarrhea, constipation, polyuria, polydipsia, dysuria, hematuria, frequency, urgency, abnormal bleeding or bruising, fever, chills, unexpected weight changes, mood swings, change in skin or hair texture, change in voice quality, auditory or visual problems, allergic reactions or rashes.  He has discomfort in his left costochondral area as well as in the left sternoclavicular joint area especially when he stretches his head and shoulder in opposite directions.  PHYSICAL EXAM BP 116/68  Pulse 57  Resp 16  Ht 5\' 11"  (1.803 m)  Wt 176 lb 8 oz (80.06 kg)  BMI 24.63 kg/m2  General: Alert, oriented x3, no distress; he is very minimally pale; he appears to be in great spirits and in no discomfort today Head: no evidence of trauma, PERRL, EOMI, no exophtalmos or lid lag, no myxedema, no xanthelasma; normal ears, nose and oropharynx Neck: normal jugular venous pulsations and no hepatojugular reflux; brisk carotid pulses without delay and no carotid bruits Chest: clear to auscultation, there is a very small area of dullness to percussion at the left base, normal fremitus, symmetrical and full respiratory excursions; sternotomy scar is healing very well Cardiovascular: normal position and quality of the apical impulse, regular rhythm, normal first and second heart sounds, no murmurs, rubs or gallops Abdomen: no tenderness or distention, no masses by palpation, no abnormal  pulsatility or arterial bruits, normal bowel sounds, no hepatosplenomegaly Extremities: no clubbing, cyanosis or edema; 2+ radial, ulnar and brachial pulses bilaterally; 2+ right femoral, posterior tibial and dorsalis pedis pulses; 2+ left femoral, posterior tibial and dorsalis pedis pulses; no subclavian or femoral bruits; healed saphenectomy scars Neurological: grossly nonfocal   EKG: Sinus rhythm, minor intraventricular conduction delay  Lipid Panel     Component Value Date/Time   CHOL  228* 07/25/2011 0846   TRIG 159.0* 07/25/2011 0846   HDL 51.70 07/25/2011 0846   CHOLHDL 4 07/25/2011 0846   VLDL 31.8 07/25/2011 0846   LDLCALC 70 09/27/2007 0818    BMET    Component Value Date/Time   NA 139 01/25/2013 0620   K 3.9 01/25/2013 0620   CL 103 01/25/2013 0620   CO2 28 01/25/2013 0620   GLUCOSE 109* 01/25/2013 0620   BUN 24* 01/25/2013 0620   CREATININE 1.22 01/25/2013 0620   CREATININE 1.38* 01/07/2013 0945   CALCIUM 9.3 01/25/2013 0620   GFRNONAA 56* 01/25/2013 0620   GFRAA 65* 01/25/2013 0620     ASSESSMENT AND PLAN CAD- S/P CABG x 5 (LIMA-LAD, SVG-ramus intermedius, SVG-OM1, SVG to distal RCA, SVG-PDA) 01/21/13 Mr. Barg has recovered quite well following bypass surgery. He has clinical and x-ray evidence of a small pleural effusion but has no respiratory difficulty. He remains in sinus rhythm today. By clinical exam he appears to be mildly anemic. His last hemoglobin was 11. I think he is ready to start cardiac rehabilitation. We reviewed the fact that bypass surgery is not curative procedure and that attention to risk factor modification needs to be redoubled to prevent the need for future revascularization procedures. He is eager to start a daily program of exercise and voluntarily stated that [facility] as he graduates from cardiac rehabilitation. We need to find some way to bring his LDL cholesterol down to less than 100 (preferably less than 70) but he  has a history of intolerance to statins in the past. He remembers taking atorvastatin for sure but is uncertain which other statins he was intolerance to. I think we will start with a low dose of pravastatin , and coenzyme Q10 and reevaluate his lipid profile in 3 months  Orders Placed This Encounter  Procedures  . Lipid Profile  . Comp Met (CMET)  . EKG 12-Lead   Meds ordered this encounter  Medications  . pravastatin (PRAVACHOL) 40 MG tablet    Sig: Take 1 tablet (40 mg total) by mouth every evening.    Dispense:  30 tablet    Refill:  6  . DISCONTD: losartan (COZAAR) 50 MG tablet    Sig: Take 1 tablet (50 mg total) by mouth daily.    Dispense:  90 tablet    Refill:  3    Order Specific Question:  Supervising Provider    Answer:  HENDRICKSON, STEVEN C [[id number]]  . co-enzyme Q-10 30 MG capsule    Sig: Take 30 mg by mouth daily.  Marland Kitchen losartan (COZAAR) 50 MG tablet    Sig: Take 1 tablet (50 mg total) by mouth daily.    Dispense:  90 tablet    Refill:  3    Order Specific Question:  Supervising Provider    Answer:  Charlett Lango C [[id number]]    Jenasia Dolinar  Thurmon Fair, MD, [facility] [phone] office [phone] pager

## 2013-02-20 NOTE — Patient Instructions (Addendum)
Your physician recommends that you schedule a follow-up appointment in: 3 months  Have labs drawn one week prior to next appointment.  Start Pravastatin 40mg  take one at bedtime.  Start Co Q 10 daily

## 2013-02-25 ENCOUNTER — Encounter: Payer: Self-pay | Admitting: Internal Medicine

## 2013-02-25 ENCOUNTER — Ambulatory Visit (INDEPENDENT_AMBULATORY_CARE_PROVIDER_SITE_OTHER): Payer: Medicare Other | Admitting: Internal Medicine

## 2013-02-25 VITALS — BP 132/80 | HR 72 | Temp 97.7°F | Wt 177.6 lb

## 2013-02-25 DIAGNOSIS — I209 Angina pectoris, unspecified: Secondary | ICD-10-CM

## 2013-02-25 DIAGNOSIS — I25810 Atherosclerosis of coronary artery bypass graft(s) without angina pectoris: Secondary | ICD-10-CM

## 2013-02-25 DIAGNOSIS — I208 Other forms of angina pectoris: Secondary | ICD-10-CM

## 2013-02-25 DIAGNOSIS — I2089 Other forms of angina pectoris: Secondary | ICD-10-CM

## 2013-02-25 DIAGNOSIS — E119 Type 2 diabetes mellitus without complications: Secondary | ICD-10-CM

## 2013-02-25 DIAGNOSIS — E785 Hyperlipidemia, unspecified: Secondary | ICD-10-CM

## 2013-02-25 NOTE — Assessment & Plan Note (Signed)
Seems to be doing well, has postop anemia, labs

## 2013-02-25 NOTE — Patient Instructions (Addendum)
Please make an appointment for blood work tomorrow morning: FLP--- dx  high cholesterol CBC --- dx anemia Next visit with me in 4 months

## 2013-02-25 NOTE — Assessment & Plan Note (Signed)
Started Pravachol 2 days ago, so far no side effects, likes a FLP to see his baseline. See instructions

## 2013-02-25 NOTE — Assessment & Plan Note (Signed)
Resolved after CABG

## 2013-02-25 NOTE — Progress Notes (Signed)
  Subjective:    Patient ID: Charles [name], male    DOB: 1934-11-23, 78 y.o.   MRN: [id number]  HPI Routine checkup Since the last time he was here, he was diagnosed with CAD, status post CABG, doing well. Has some postop chest pain on the right side, already d/w his surgeon. Diabetes, ambulatory blood sugars around 140 two hours postprandial. Hypertension, good medication compliance, ambulatory BPs 130/60 on average. High cholesterol, started Pravachol 2 days ago, likes to get a baseline FLP,  so far no side effects.  Past Medical History  Diagnosis Date  . Hypertension   . Hyperlipidemia     diet controlled, statins and zetia intolerant  . Retinopathy     mild glaucomea  . Transient ischemic attack 1999  . Histoplasmosis   . Chronic cough 08/2009    -allergy profile- june 2,2011- neg. -Max GERD rx/ off oils January 07 2010 x 3 weeks only but no benefit. - Sinus CT rec July 11,2011- refused due to claustrophobia -MCT neg for asthma 03/01/10 -Add 1st Gen H1 July 11,2011- better  . Macular degeneration   . PONV (postoperative nausea and vomiting)     extreme nausea  . Coronary artery disease   . Diabetes mellitus     no meds   Past Surgical History  Procedure Laterality Date  . Cervical spine surgery  2002    plate  . Cholecystectomy  1985  . Coronary artery bypass graft N/A 01/21/2013    Procedure: Coronary Arery Bypass Grafting  Times Five Using Left Internal Mammary Artery and Right Saphenous Leg Vein Harvested Endoscopically;  Surgeon: Loreli Slot, MD;  Location: [facility] OR;  Service: Open Heart Surgery;  Laterality: N/A;    Review of Systems Diet--described as  Good. Exercise--ready to start cardiac rehabilitation soon. Denies nausea, vomiting, diarrhea or blood in the stools. No shortness of breath no cough.     Objective:   Physical Exam BP 132/80  Pulse 72  Temp(Src) 97.7 F (36.5 C) (Oral)  Wt 177 lb 9.6 oz (80.559 kg)  BMI 24.78 kg/m2  SpO2 95% General --  alert, well-developed,NAD .    Lungs -- normal respiratory effort, no intercostal retractions, no accessory muscle use, and normal breath sounds.   Heart-- normal rate, regular rhythm, no murmur, and no gallop.   Extremities-- no pretibial edema bilaterally  Neurologic-- alert & oriented X3 and strength normal in all extremities. Psych-- Cognition and judgment appear intact. Alert and cooperative with normal attention span and concentration.  not anxious appearing and not depressed appearing.      Assessment & Plan:

## 2013-02-25 NOTE — Assessment & Plan Note (Signed)
Last A1c 6.1. He has information home about diet, discuss referral to a nutritionist but he prefers to read the info he has

## 2013-02-26 ENCOUNTER — Other Ambulatory Visit (INDEPENDENT_AMBULATORY_CARE_PROVIDER_SITE_OTHER): Payer: Medicare Other

## 2013-02-26 DIAGNOSIS — E7800 Pure hypercholesterolemia, unspecified: Secondary | ICD-10-CM

## 2013-02-26 DIAGNOSIS — D649 Anemia, unspecified: Secondary | ICD-10-CM

## 2013-02-26 LAB — CBC WITH DIFFERENTIAL/PLATELET
Basophils Relative: 0.7 % (ref 0.0–3.0)
Eosinophils Absolute: 0.3 10*3/uL (ref 0.0–0.7)
Eosinophils Relative: 4.2 % (ref 0.0–5.0)
HCT: 37.9 % — ABNORMAL LOW (ref 39.0–52.0)
Lymphs Abs: 1.9 10*3/uL (ref 0.7–4.0)
MCHC: 33.3 g/dL (ref 30.0–36.0)
MCV: 91.1 fl (ref 78.0–100.0)
Monocytes Absolute: 0.6 10*3/uL (ref 0.1–1.0)
Platelets: 220 10*3/uL (ref 150.0–400.0)
RBC: 4.16 Mil/uL — ABNORMAL LOW (ref 4.22–5.81)
WBC: 8 10*3/uL (ref 4.5–10.5)

## 2013-02-26 LAB — LIPID PANEL
Cholesterol: 163 mg/dL (ref 0–200)
HDL: 45.1 mg/dL (ref >39.00)

## 2013-02-28 ENCOUNTER — Encounter (HOSPITAL_COMMUNITY)
Admission: RE | Admit: 2013-02-28 | Discharge: 2013-02-28 | Disposition: A | Discharge status: Home / Self Care | Payer: Medicare Other | Source: Ambulatory Visit | Attending: Cardiovascular Disease | Admitting: Cardiovascular Disease

## 2013-02-28 DIAGNOSIS — I2510 Atherosclerotic heart disease of native coronary artery without angina pectoris: Secondary | ICD-10-CM | POA: Insufficient documentation

## 2013-02-28 DIAGNOSIS — Z951 Presence of aortocoronary bypass graft: Secondary | ICD-10-CM | POA: Insufficient documentation

## 2013-02-28 DIAGNOSIS — Z8673 Personal history of transient ischemic attack (TIA), and cerebral infarction without residual deficits: Secondary | ICD-10-CM | POA: Insufficient documentation

## 2013-02-28 DIAGNOSIS — Z5189 Encounter for other specified aftercare: Secondary | ICD-10-CM | POA: Insufficient documentation

## 2013-02-28 NOTE — Progress Notes (Signed)
Cardiac Rehab Medication Review by a Pharmacist  Does the patient  feel that his/her medications are working for him/her?  yes  Has the patient been experiencing any side effects to the medications prescribed?  no  Does the patient measure his/her own blood pressure or blood glucose at home?  yes   Does the patient have any problems obtaining medications due to transportation or finances?   yes  Understanding of regimen: good Understanding of indications: good Potential of compliance: excellent    Pharmacist comments: 78 yo male, pleasant, cooperative; has a good understanding of his medications. He brought in a list of his medications today and denies missing doses, says wife helps him stay very compliant. BP doing very well per patient report.   Starr Lake 02/28/2013 8:39 AM

## 2013-03-04 ENCOUNTER — Encounter (HOSPITAL_COMMUNITY): Payer: Self-pay

## 2013-03-04 ENCOUNTER — Encounter (HOSPITAL_COMMUNITY)
Admission: RE | Admit: 2013-03-04 | Discharge: 2013-03-04 | Disposition: A | Discharge status: Home / Self Care | Payer: Medicare Other | Source: Ambulatory Visit | Attending: Cardiovascular Disease | Admitting: Cardiovascular Disease

## 2013-03-04 ENCOUNTER — Encounter: Payer: Self-pay | Admitting: *Deleted

## 2013-03-04 NOTE — Progress Notes (Signed)
Pt started cardiac rehab today.  Pt tolerated light exercise without difficulty.  [employer].  VSS, telemetry-intermittent complete RBBB/ incomplete RBBB.  Strips faxed to MD to review.   Pt oriented to exercise equipment and routine.  Understanding verbalized.  Pt psychosocial assessment reveals no barriers to rehab participation.  Pt quality of life is slightly altered by his recent illness. Pt expresses concern about his overall health. Pt exhibits positive coping skills and has supportive family.  Offered emotional support and reassurance.  Will continue to monitor.

## 2013-03-06 ENCOUNTER — Encounter (HOSPITAL_COMMUNITY)
Admission: RE | Admit: 2013-03-06 | Discharge: 2013-03-06 | Disposition: A | Discharge status: Home / Self Care | Payer: Medicare Other | Source: Ambulatory Visit | Attending: Cardiovascular Disease | Admitting: Cardiovascular Disease

## 2013-03-06 LAB — GLUCOSE, CAPILLARY: Glucose-Capillary: 89 mg/dL (ref 70–99)

## 2013-03-08 ENCOUNTER — Encounter (HOSPITAL_COMMUNITY)
Admission: RE | Admit: 2013-03-08 | Discharge: 2013-03-08 | Disposition: A | Discharge status: Home / Self Care | Payer: Medicare Other | Source: Ambulatory Visit | Attending: Cardiovascular Disease | Admitting: Cardiovascular Disease

## 2013-03-08 DIAGNOSIS — I2510 Atherosclerotic heart disease of native coronary artery without angina pectoris: Secondary | ICD-10-CM | POA: Insufficient documentation

## 2013-03-08 DIAGNOSIS — Z951 Presence of aortocoronary bypass graft: Secondary | ICD-10-CM | POA: Insufficient documentation

## 2013-03-08 DIAGNOSIS — Z8673 Personal history of transient ischemic attack (TIA), and cerebral infarction without residual deficits: Secondary | ICD-10-CM | POA: Insufficient documentation

## 2013-03-08 DIAGNOSIS — Z5189 Encounter for other specified aftercare: Secondary | ICD-10-CM | POA: Insufficient documentation

## 2013-03-08 LAB — GLUCOSE, CAPILLARY
Glucose-Capillary: 101 mg/dL — ABNORMAL HIGH (ref 70–99)
Glucose-Capillary: 129 mg/dL — ABNORMAL HIGH (ref 70–99)

## 2013-03-08 NOTE — Progress Notes (Signed)
Charles [name] 78 y.o. male Nutrition Note Spoke with pt.  Nutrition Plan and Nutrition Survey goals reviewed with pt. Pt is following Step 2 of the Therapeutic Lifestyle Changes diet. Pt is a diet-controlled diabetic. Last A1c indicates blood glucose well-controlled. This Clinical research associate went over Diabetes Education test results. Pt expressed understanding of the information reviewed. Pt aware of nutrition education classes offered.  Nutrition Diagnosis Food-and nutrition-related knowledge deficit related to lack of exposure to information as related to diagnosis of: ? CVD ? DM (A1c 6.1)   Nutrition RX/ Estimated Daily Nutrition Needs for: wt maintenance 2200-2500 Kcal, 70-80 gm fat, 14-17 gm sat fat, 2.1-2.5 gm trans-fat, <1500 mg sodium, 325 gm CHO   Nutrition Intervention   Pt's individual nutrition plan reviewed with pt.   Benefits of adopting Therapeutic Lifestyle Changes discussed when Medficts reviewed.   Pt to attend the Portion Distortion class - met 03/06/13   Pt to attend the  ? Nutrition I class                     ? Nutrition II class        ? Diabetes Blitz class       ? Diabetes Q & A class   Pt given handouts for: ? 325 gram Consistent Carbohydrate diet   Continue client-centered nutrition education by RD, as part of interdisciplinary care. Goal(s)   Pt to describe the benefit of including fruits, vegetables, whole grains, and low-fat dairy products in a heart healthy meal plan. Monitor and Evaluate progress toward nutrition goal with team. Nutrition Risk: Change to Moderate  Mickle Plumb, M.Ed, RD, LDN, CDE 03/08/2013 8:34 AM

## 2013-03-11 ENCOUNTER — Encounter (HOSPITAL_COMMUNITY)
Admission: RE | Admit: 2013-03-11 | Discharge: 2013-03-11 | Disposition: A | Discharge status: Home / Self Care | Payer: Medicare Other | Source: Ambulatory Visit | Attending: Cardiovascular Disease | Admitting: Cardiovascular Disease

## 2013-03-11 LAB — GLUCOSE, CAPILLARY: Glucose-Capillary: 151 mg/dL — ABNORMAL HIGH (ref 70–99)

## 2013-03-13 ENCOUNTER — Encounter (HOSPITAL_COMMUNITY)
Admission: RE | Admit: 2013-03-13 | Discharge: 2013-03-13 | Disposition: A | Discharge status: Home / Self Care | Payer: Medicare Other | Source: Ambulatory Visit | Attending: Cardiovascular Disease | Admitting: Cardiovascular Disease

## 2013-03-13 ENCOUNTER — Ambulatory Visit (INDEPENDENT_AMBULATORY_CARE_PROVIDER_SITE_OTHER): Payer: Medicare Other | Admitting: Cardiovascular Disease

## 2013-03-13 ENCOUNTER — Encounter: Payer: Self-pay | Admitting: Cardiovascular Disease

## 2013-03-13 VITALS — BP 120/78 | HR 60 | Ht 71.0 in | Wt 177.8 lb

## 2013-03-13 DIAGNOSIS — I10 Essential (primary) hypertension: Secondary | ICD-10-CM

## 2013-03-13 DIAGNOSIS — E785 Hyperlipidemia, unspecified: Secondary | ICD-10-CM

## 2013-03-13 DIAGNOSIS — I25810 Atherosclerosis of coronary artery bypass graft(s) without angina pectoris: Secondary | ICD-10-CM

## 2013-03-13 LAB — GLUCOSE, CAPILLARY: Glucose-Capillary: 90 mg/dL (ref 70–99)

## 2013-03-13 NOTE — Assessment & Plan Note (Signed)
No anginal chest pain. He has right-sided chest pain is clearly musculoskeletal and is likely related to the chondrosternal joint. He also has a popping sound that is heard intermittently when he crunches over. He is doing quite well in rehabilitation but feels that he can do much more than the program currently promotes. He is eager to go back to playing golf. No clinically evident arrhythmia.

## 2013-03-13 NOTE — Assessment & Plan Note (Signed)
Good control

## 2013-03-13 NOTE — Patient Instructions (Addendum)
Your physician recommends that you schedule a follow-up appointment in: 6 weeks  You may temporarily interrupt your pravastatin for a week or so to see if this helps with the constipation. If it doesn't please resume the pravastatin as previously scheduled. He may also do the same thing with the slosartan to see if this helps. Please do not stop taking the metoprolol until we discuss it at your upcoming appointment.

## 2013-03-13 NOTE — Progress Notes (Signed)
Patient ID: Charles [name], male   DOB: 1935/01/14, 78 y.o.   MRN: [id number]     Reason for office visit CAD status post CABG  Roughly 6 weeks following his multivessel bypass procedure Charles [name] is recovering quite well. He is participating in cardiac rehabilitation and feels that he can do much more than his been required of him. He has occasional positional right-sided chest discomfort relieved with modification of position. It can be quite sharp and keeps him awake sometimes at night. He has also noted an intermittent clicking sound in his left chest close to the sternum when he suddenly extends his back from a crunched up position. He is eager to return to playing golf. He denies any shortness of breath dizziness or syncope. So far he is tolerating pravastatin without any of the muscle aches that plagued him in the past.    Allergies  Allergen Reactions  . Atorvastatin     REACTION: myalgia  . Ezetimibe     REACTION: dizzy  . Lovastatin     REACTION: weakness  . Percocet (Oxycodone-Acetaminophen) Other (See Comments)    hallucinations    Current Outpatient Prescriptions  Medication Sig Dispense Refill  . acetaminophen (TYLENOL) 325 MG tablet Take 650 mg by mouth every 6 (six) hours as needed for pain.      Marland Kitchen aspirin EC 325 MG EC tablet Take 1 tablet (325 mg total) by mouth daily.  30 tablet  0  . Cholecalciferol (VITAMIN D3) 2000 UNITS TABS Take 2,000 Units by mouth daily.      . Coenzyme Q10 (COQ10) 100 MG CAPS Take 100 mg by mouth daily.      . finasteride (PROSCAR) 5 MG tablet Take 5 mg by mouth daily.      Marland Kitchen KRILL OIL 1000 MG CAPS Take 1,000 mg by mouth daily.       Marland Kitchen losartan (COZAAR) 50 MG tablet Take 1 tablet (50 mg total) by mouth daily.  90 tablet  3  . metoprolol tartrate (LOPRESSOR) 25 MG tablet Take 37.5 mg by mouth daily.      Marland Kitchen OVER THE COUNTER MEDICATION Take 1 tablet by mouth daily. Macular degeneration vitamin.      . pravastatin (PRAVACHOL) 40 MG tablet Take  1 tablet (40 mg total) by mouth every evening.  30 tablet  6   No current facility-administered medications for this visit.    Past Medical History  Diagnosis Date  . Hypertension   . Hyperlipidemia     diet controlled, statins and zetia intolerant  . Retinopathy     mild glaucomea  . Transient ischemic attack 1999  . Histoplasmosis   . Chronic cough 08/2009    -allergy profile- june 2,2011- neg. -Max GERD rx/ off oils January 07 2010 x 3 weeks only but no benefit. - Sinus CT rec July 11,2011- refused due to claustrophobia -MCT neg for asthma 03/01/10 -Add 1st Gen H1 July 11,2011- better  . Macular degeneration   . PONV (postoperative nausea and vomiting)     extreme nausea  . Coronary artery disease   . Diabetes mellitus     no meds    Past Surgical History  Procedure Laterality Date  . Cervical spine surgery  2002    plate  . Cholecystectomy  1985  . Coronary artery bypass graft N/A 01/21/2013    Procedure: Coronary Arery Bypass Grafting  Times Five Using Left Internal Mammary Artery and Right Saphenous Leg Vein Harvested Endoscopically;  Surgeon:  Loreli Slot, MD;  Location: [facility] OR;  Service: Open Heart Surgery;  Laterality: N/A;    Family History  Problem Relation Age of Onset  . Coronary artery disease Neg Hx   . Stroke Neg Hx   . Colon cancer Neg Hx   . Prostate cancer Neg Hx   . Atopy Neg Hx   . Asthma Neg Hx   . Hypertension Mother   . Hypertension Father   . Diabetes Father   . Cancer Father     colon  . Hypertension Brother   . Cancer Brother     lung  . Hypertension Sister   . Cancer Sister     lung  . Diabetes Brother   . Diabetes Sister   . Diabetes      Grandparents   . Lung cancer Brother   . Lung cancer Sister     History   Social History  . Marital Status: Married    Spouse Name: N/A    Number of Children: 2  . Years of Education: N/A   Occupational History  . reitred    Social History Main Topics  . Smoking status: Former  Smoker -- 1.00 packs/day for 10 years    Types: Cigarettes    Quit date: 01/17/1977  . Smokeless tobacco: Never Used     Comment: Stopped at age 41   . Alcohol Use: Yes     Comment: seldom  . Drug Use: No  . Sexually Active: Not on file   Other Topics Concern  . Not on file   Social History Narrative  . No narrative on file    Review of systems: The patient specifically denies any chest pain at rest or with exertion, dyspnea at rest or with exertion, orthopnea, paroxysmal nocturnal dyspnea, syncope, palpitations, focal neurological deficits, intermittent claudication, lower extremity edema, unexplained weight gain, cough, hemoptysis or wheezing.  The patient also denies abdominal pain, nausea, vomiting, dysphagia, diarrhea, constipation, polyuria, polydipsia, dysuria, hematuria, frequency, urgency, abnormal bleeding or bruising, fever, chills, unexpected weight changes, mood swings, change in skin or hair texture, change in voice quality, auditory or visual problems, allergic reactions or rashes, new musculoskeletal complaints other than described above    PHYSICAL EXAM BP 120/78  Pulse 60  Ht 5\' 11"  (1.803 m)  Wt 177 lb 12.8 oz (80.65 kg)  BMI 24.81 kg/m2  General: Alert, oriented x3, no distress Head: no evidence of trauma, PERRL, EOMI, no exophtalmos or lid lag, no myxedema, no xanthelasma; normal ears, nose and oropharynx Neck: normal jugular venous pulsations and no hepatojugular reflux; brisk carotid pulses without delay and no carotid bruits Chest: clear to auscultation, no signs of consolidation by percussion or palpation, normal fremitus, symmetrical and full respiratory excursions Cardiovascular: normal position and quality of the apical impulse, regular rhythm, normal first and second heart sounds, no murmurs, rubs or gallops Abdomen: no tenderness or distention, no masses by palpation, no abnormal pulsatility or arterial bruits, normal bowel sounds, no  hepatosplenomegaly Extremities: no clubbing, cyanosis or edema; 2+ radial, ulnar and brachial pulses bilaterally; 2+ right femoral, posterior tibial and dorsalis pedis pulses; 2+ left femoral, posterior tibial and dorsalis pedis pulses; no subclavian or femoral bruits Neurological: grossly nonfocal I do not detect any evidence of sternal instability. His sternum is well healed.  EKG: Sinus rhythm, right bundle branch block  Lipid Panel     Component Value Date/Time   CHOL 163 02/26/2013 0806   TRIG 105.0 02/26/2013 0806  HDL 45.10 02/26/2013 0806   CHOLHDL 4 02/26/2013 0806   VLDL 21.0 02/26/2013 0806   LDLCALC 97 02/26/2013 0806    BMET    Component Value Date/Time   NA 139 01/25/2013 0620   K 3.9 01/25/2013 0620   CL 103 01/25/2013 0620   CO2 28 01/25/2013 0620   GLUCOSE 109* 01/25/2013 0620   BUN 24* 01/25/2013 0620   CREATININE 1.22 01/25/2013 0620   CREATININE 1.38* 01/07/2013 0945   CALCIUM 9.3 01/25/2013 0620   GFRNONAA 56* 01/25/2013 0620   GFRAA 65* 01/25/2013 0620     ASSESSMENT AND PLAN CAD- S/P CABG x 5 (LIMA-LAD, SVG-ramus intermedius, SVG-OM1, SVG to distal RCA, SVG-PDA) 01/21/13 No anginal chest pain. He has right-sided chest pain is clearly musculoskeletal and is likely related to the chondrosternal joint. He also has a popping sound that is heard intermittently when he crunches over. He is doing quite well in rehabilitation but feels that he can do much more than the program currently promotes. He is eager to go back to playing golf. No clinically evident arrhythmia.  HYPERTENSION Good control  HYPERLIPIDEMIA So far neuromuscular side effects (which had been his previous problem) with pravastatin. He reports constipation. Discussed the fact that this is not a prominent side effect of either statins her antihypertensive medications. He can try a brief holiday from his pravastatin and then his the circumflex to see if this helps.  Orders Placed This Encounter  Procedures    . EKG 12-Lead   No orders of the defined types were placed in this encounter.    Charles Silk, MD, [facility] [phone] office [phone] pager

## 2013-03-13 NOTE — Assessment & Plan Note (Signed)
So far neuromuscular side effects (which had been his previous problem) with pravastatin. He reports constipation. Discussed the fact that this is not a prominent side effect of either statins her antihypertensive medications. He can try a brief holiday from his pravastatin and then his the circumflex to see if this helps.

## 2013-03-15 ENCOUNTER — Encounter (HOSPITAL_COMMUNITY)
Admission: RE | Admit: 2013-03-15 | Discharge: 2013-03-15 | Disposition: A | Discharge status: Home / Self Care | Payer: Medicare Other | Source: Ambulatory Visit | Attending: Cardiovascular Disease | Admitting: Cardiovascular Disease

## 2013-03-15 LAB — GLUCOSE, CAPILLARY
Glucose-Capillary: 123 mg/dL — ABNORMAL HIGH (ref 70–99)
Glucose-Capillary: 80 mg/dL (ref 70–99)

## 2013-03-15 NOTE — Progress Notes (Signed)
Reviewed home exercise with pt today.  Pt plans to walk outside and possibly join the [facility] for 30-45 minutes/day, 2-4 days/week outside of Cardiac Rehab for exercise.  Reviewed THR, pulse, RPE, sign and symptoms, NTG use, and when to call 911 or MD.  Pt voiced understanding.  Alexia Freestone, MS, ACSM RCEP 9:49 AM

## 2013-03-18 ENCOUNTER — Encounter (HOSPITAL_COMMUNITY)
Admission: RE | Admit: 2013-03-18 | Discharge: 2013-03-18 | Disposition: A | Discharge status: Home / Self Care | Payer: Medicare Other | Source: Ambulatory Visit | Attending: Cardiovascular Disease | Admitting: Cardiovascular Disease

## 2013-03-18 LAB — GLUCOSE, CAPILLARY: Glucose-Capillary: 104 mg/dL — ABNORMAL HIGH (ref 70–99)

## 2013-03-20 ENCOUNTER — Encounter (HOSPITAL_COMMUNITY)
Admission: RE | Admit: 2013-03-20 | Discharge: 2013-03-20 | Disposition: A | Discharge status: Home / Self Care | Payer: Medicare Other | Source: Ambulatory Visit | Attending: Cardiovascular Disease | Admitting: Cardiovascular Disease

## 2013-03-20 ENCOUNTER — Telehealth: Payer: Self-pay | Admitting: Cardiovascular Disease

## 2013-03-20 NOTE — Telephone Encounter (Signed)
Per Dr. Royann Shivers,  "Supposed to be taking Losartan 50 mg (one daily) Metoprolol 50 mg 1 1/2, which he should increase to twice daily.  Ok to exercise if SBP<180 at rest."  Call to [facility] and informed.  Verbalized understanding and agreed to inform pt to increase Metoprolol to BID.

## 2013-03-20 NOTE — Progress Notes (Signed)
Pt arrived at cardiac rehab BP-172/78 standing.  Recheck 160/78 sitting.  Pt also reports episode of chest burning while walking briskly up hill exercising at home yesterday.  Pt states this symptom was same as his pre CABG symptoms.  Pt reports the chest burning was resolved with slowly his pace. Pt is very anxious to exercise today as he would like to see if symptoms are reproduced here.  Pt states he has previously been advised to take lopressor 25mg  1.5 tablets once daily.  Pt is unsure if he has been taking lopressor 1.5 tablet or losartan 1.5 tablet.  Pt will verify this upon arrival home.  Phone call to  [facility], Dr. Rubie Maid office to report pt blood pressure and chest burning.  Rhythm strip and rehab report faxed. After reviewing with MD, Amber called back to advise pt should increase metoprolol to 37.5 mg twice daily.  Continue losartan 50mg  once daily.  OK for pt to exercise if BP <180 systolic at rest, per Dr. Rubie Maid.  Pt given medication instruction.  Pt also advised to notify Dr. Rubie Maid office if he realizes he has indeed forgotten to take medication today and/or if chest burning returns. Understanding  verbalized

## 2013-03-20 NOTE — Telephone Encounter (Signed)
Charles [name], Cardiac Rehab, stated pt w/ elevated BP today.  BP 170/78.  Stated they wanted to be able to do rehab today, but BP trending up.  RN asked if pt has been taking BP meds as BP was normal last week at visit.  Per Charles [name], pt stated he has been taking meds, but is not sure if he has been taking 1 1/2 Metoprolol or 1/12 Losartan and can't confirm that until he gets home.  Stated pt also c/o chest discomfort at home yesterday when walking c/o a burning sensation in his chest.  Informed Charles [name] will be notified for further instructions.  Verbalized understanding and will fax EKG from today.  Message forwarded to Charles [name].

## 2013-03-22 ENCOUNTER — Encounter (HOSPITAL_COMMUNITY)
Admission: RE | Admit: 2013-03-22 | Discharge: 2013-03-22 | Disposition: A | Discharge status: Home / Self Care | Payer: Medicare Other | Source: Ambulatory Visit | Attending: Cardiovascular Disease | Admitting: Cardiovascular Disease

## 2013-03-22 LAB — GLUCOSE, CAPILLARY: Glucose-Capillary: 126 mg/dL — ABNORMAL HIGH (ref 70–99)

## 2013-03-22 NOTE — Progress Notes (Signed)
Pt arrived at cardiac rehab reporting he has taken his home medication as prescribed.  Pt states he increased his metoprolol 37.5mg  BID as ordered.  Pt denies further symptoms of chest burning.  BP improved today at cardiac rehab, resting 130/62, peak 160/64.  Will continue to monitor.

## 2013-03-25 ENCOUNTER — Encounter (HOSPITAL_COMMUNITY)
Admission: RE | Admit: 2013-03-25 | Discharge: 2013-03-25 | Disposition: A | Discharge status: Home / Self Care | Payer: Medicare Other | Source: Ambulatory Visit | Attending: Cardiovascular Disease | Admitting: Cardiovascular Disease

## 2013-03-27 ENCOUNTER — Encounter (HOSPITAL_COMMUNITY)
Admission: RE | Admit: 2013-03-27 | Discharge: 2013-03-27 | Disposition: A | Discharge status: Home / Self Care | Payer: Medicare Other | Source: Ambulatory Visit | Attending: Cardiovascular Disease | Admitting: Cardiovascular Disease

## 2013-03-29 ENCOUNTER — Encounter (HOSPITAL_COMMUNITY)
Admission: RE | Admit: 2013-03-29 | Discharge: 2013-03-29 | Disposition: A | Discharge status: Home / Self Care | Payer: Medicare Other | Source: Ambulatory Visit | Attending: Cardiovascular Disease | Admitting: Cardiovascular Disease

## 2013-04-01 ENCOUNTER — Encounter (HOSPITAL_COMMUNITY)
Admission: RE | Admit: 2013-04-01 | Discharge: 2013-04-01 | Disposition: A | Discharge status: Home / Self Care | Payer: Medicare Other | Source: Ambulatory Visit | Attending: Cardiovascular Disease | Admitting: Cardiovascular Disease

## 2013-04-01 LAB — GLUCOSE, CAPILLARY: Glucose-Capillary: 112 mg/dL — ABNORMAL HIGH (ref 70–99)

## 2013-04-01 NOTE — Progress Notes (Signed)
Pt reported episode of chest tightness while exercising at home on Friday.  Pt states it occurred with walking up hill was relieved with stopping activity.   Pt reports this chest tightness is similar to his preCABG symptoms.  This is the second occurrence of this symptom in past few weeks. Pt able to exercise at cardiac rehab without symptoms.  appt scheduled with Dr. Royann Shivers Thursday 04/04/13 @4 :15.  Pt instructed in proper NTG use and when to call 911.  Pt also instructed to contact office if symptoms worsen before scheduled appt.  Understanding verbalized

## 2013-04-03 ENCOUNTER — Encounter (HOSPITAL_COMMUNITY)
Admission: RE | Admit: 2013-04-03 | Discharge: 2013-04-03 | Disposition: A | Discharge status: Home / Self Care | Payer: Medicare Other | Source: Ambulatory Visit | Attending: Cardiovascular Disease | Admitting: Cardiovascular Disease

## 2013-04-03 LAB — GLUCOSE, CAPILLARY: Glucose-Capillary: 129 mg/dL — ABNORMAL HIGH (ref 70–99)

## 2013-04-04 ENCOUNTER — Encounter: Payer: Self-pay | Admitting: Cardiovascular Disease

## 2013-04-04 ENCOUNTER — Ambulatory Visit (INDEPENDENT_AMBULATORY_CARE_PROVIDER_SITE_OTHER): Payer: Medicare Other | Admitting: Cardiovascular Disease

## 2013-04-04 VITALS — BP 130/80 | HR 97 | Resp 16 | Ht 72.0 in | Wt 179.5 lb

## 2013-04-04 DIAGNOSIS — I208 Other forms of angina pectoris: Secondary | ICD-10-CM

## 2013-04-04 DIAGNOSIS — I209 Angina pectoris, unspecified: Secondary | ICD-10-CM

## 2013-04-04 DIAGNOSIS — I10 Essential (primary) hypertension: Secondary | ICD-10-CM

## 2013-04-04 DIAGNOSIS — E785 Hyperlipidemia, unspecified: Secondary | ICD-10-CM

## 2013-04-04 DIAGNOSIS — I25810 Atherosclerosis of coronary artery bypass graft(s) without angina pectoris: Secondary | ICD-10-CM

## 2013-04-04 DIAGNOSIS — E119 Type 2 diabetes mellitus without complications: Secondary | ICD-10-CM

## 2013-04-04 MED ORDER — ISOSORBIDE MONONITRATE ER 30 MG PO TB24: 30.0000 mg | ORAL_TABLET | Freq: Every day | ORAL | Status: DC

## 2013-04-04 NOTE — Patient Instructions (Addendum)
Your physician has recommended you make the following change in your medication: Isosorbide 30 mg daily Your physician recommends that you schedule a follow-up appointment in: 3 weeks

## 2013-04-05 ENCOUNTER — Encounter (HOSPITAL_COMMUNITY)
Admission: RE | Admit: 2013-04-05 | Discharge: 2013-04-05 | Disposition: A | Discharge status: Home / Self Care | Payer: Medicare Other | Source: Ambulatory Visit | Attending: Cardiovascular Disease | Admitting: Cardiovascular Disease

## 2013-04-07 NOTE — Progress Notes (Signed)
Patient ID: Charles [name], male   DOB: 1934/10/17, 78 y.o.   MRN: [id number]     Reason for office visit Recurrent angina pectoris  Charles [name] started the discussion today by telling how disappointed he is. He has been going to cardiac rehabilitation and while falling 13 protocol has been asymptomatic. He tried to push the speed on the treadmill faster and has noticed that whenever he exceeds 3.3 miles per hour he consistently gets chest tightness identical to the symptoms he experienced preoperatively. He challenged himself by walking in his neighborhood and experienced the same problem. As before the symptoms occur generally when he walks up hill. He seems to have roughly the same threshold for symptom onset as she did before surgery. Has not expressed any chest pain at rest, and denies any dyspnea or other cardiovascular complaints.    Allergies  Allergen Reactions  . Atorvastatin     REACTION: myalgia  . Ezetimibe     REACTION: dizzy  . Lovastatin     REACTION: weakness  . Percocet [Oxycodone-Acetaminophen] Other (See Comments)    hallucinations    Current Outpatient Prescriptions  Medication Sig Dispense Refill  . acetaminophen (TYLENOL) 325 MG tablet Take 650 mg by mouth every 6 (six) hours as needed for pain.      Marland Kitchen aspirin EC 325 MG EC tablet Take 1 tablet (325 mg total) by mouth daily.  30 tablet  0  . Cholecalciferol (VITAMIN D3) 2000 UNITS TABS Take 2,000 Units by mouth daily.      . Coenzyme Q10 (COQ10) 100 MG CAPS Take 100 mg by mouth daily.      . finasteride (PROSCAR) 5 MG tablet Take 5 mg by mouth daily.      Marland Kitchen KRILL OIL 1000 MG CAPS Take 1,000 mg by mouth daily.       Marland Kitchen losartan (COZAAR) 50 MG tablet Take 1 tablet (50 mg total) by mouth daily.  90 tablet  3  . metoprolol tartrate (LOPRESSOR) 25 MG tablet Take 37.5 mg by mouth daily.      Marland Kitchen OVER THE COUNTER MEDICATION Take 1 tablet by mouth daily. Macular degeneration vitamin.      . pravastatin (PRAVACHOL) 40 MG tablet  Take 1 tablet (40 mg total) by mouth every evening.  30 tablet  6  . isosorbide mononitrate (IMDUR) 30 MG 24 hr tablet Take 1 tablet (30 mg total) by mouth daily.  30 tablet  3   No current facility-administered medications for this visit.    Past Medical History  Diagnosis Date  . Hypertension   . Hyperlipidemia     diet controlled, statins and zetia intolerant  . Retinopathy     mild glaucomea  . Transient ischemic attack 1999  . Histoplasmosis   . Chronic cough 08/2009    -allergy profile- june 2,2011- neg. -Max GERD rx/ off oils January 07 2010 x 3 weeks only but no benefit. - Sinus CT rec July 11,2011- refused due to claustrophobia -MCT neg for asthma 03/01/10 -Add 1st Gen H1 July 11,2011- better  . Macular degeneration   . PONV (postoperative nausea and vomiting)     extreme nausea  . Coronary artery disease   . Diabetes mellitus     no meds    Past Surgical History  Procedure Laterality Date  . Cervical spine surgery  2002    plate  . Cholecystectomy  1985  . Coronary artery bypass graft N/A 01/21/2013    Procedure: Coronary Arery Bypass  Grafting  Times Five Using Left Internal Mammary Artery and Right Saphenous Leg Vein Harvested Endoscopically;  Surgeon: Loreli Slot, MD;  Location: [facility] OR;  Service: Open Heart Surgery;  Laterality: N/A;    Family History  Problem Relation Age of Onset  . Coronary artery disease Neg Hx   . Stroke Neg Hx   . Colon cancer Neg Hx   . Prostate cancer Neg Hx   . Atopy Neg Hx   . Asthma Neg Hx   . Hypertension Mother   . Hypertension Father   . Diabetes Father   . Cancer Father     colon  . Hypertension Brother   . Cancer Brother     lung  . Hypertension Sister   . Cancer Sister     lung  . Diabetes Brother   . Diabetes Sister   . Diabetes      Grandparents   . Lung cancer Brother   . Lung cancer Sister     History   Social History  . Marital Status: Married    Spouse Name: N/A    Number of Children: 2  . Years of  Education: N/A   Occupational History  . reitred    Social History Main Topics  . Smoking status: Former Smoker -- 1.00 packs/day for 10 years    Types: Cigarettes    Quit date: 01/17/1977  . Smokeless tobacco: Never Used     Comment: Stopped at age 72   . Alcohol Use: Yes     Comment: seldom  . Drug Use: No  . Sexual Activity: Not on file   Other Topics Concern  . Not on file   Social History Narrative  . No narrative on file    Review of systems: The patient specifically denies any chest pain at rest, dyspnea at rest or with exertion, orthopnea, paroxysmal nocturnal dyspnea, syncope, palpitations, focal neurological deficits, intermittent claudication, lower extremity edema, unexplained weight gain, cough, hemoptysis or wheezing.  The patient also denies abdominal pain, nausea, vomiting, dysphagia, diarrhea, constipation, polyuria, polydipsia, dysuria, hematuria, frequency, urgency, abnormal bleeding or bruising, fever, chills, unexpected weight changes, mood swings, change in skin or hair texture, change in voice quality, auditory or visual problems, allergic reactions or rashes, new musculoskeletal complaints other than usual "aches and pains".\  PHYSICAL EXAM BP 130/80  Pulse 97  Resp 16  Ht 6' (1.829 m)  Wt 179 lb 8 oz (81.421 kg)  BMI 24.34 kg/m2  General: Alert, oriented x3, no distress Head: no evidence of trauma, PERRL, EOMI, no exophtalmos or lid lag, no myxedema, no xanthelasma; normal ears, nose and oropharynx Neck: normal jugular venous pulsations and no hepatojugular reflux; brisk carotid pulses without delay and no carotid bruits Chest: clear to auscultation, no signs of consolidation by percussion or palpation, normal fremitus, symmetrical and full respiratory excursions; well-healed sternotomy scar Cardiovascular: normal position and quality of the apical impulse, regular rhythm, normal first and second heart sounds, no murmurs, rubs or gallops Abdomen: no  tenderness or distention, no masses by palpation, no abnormal pulsatility or arterial bruits, normal bowel sounds, no hepatosplenomegaly Extremities: no clubbing, cyanosis or edema; 2+ radial, ulnar and brachial pulses bilaterally; 2+ right femoral, posterior tibial and dorsalis pedis pulses; 2+ left femoral, posterior tibial and dorsalis pedis pulses; no subclavian or femoral bruits Neurological: grossly nonfocal   EKG: Normal sinus rhythm, no acute repolarization abnormalities  Lipid Panel     Component Value Date/Time   CHOL 163 02/26/2013  0806   TRIG 105.0 02/26/2013 0806   HDL 45.10 02/26/2013 0806   CHOLHDL 4 02/26/2013 0806   VLDL 21.0 02/26/2013 0806   LDLCALC 97 02/26/2013 0806    BMET    Component Value Date/Time   NA 139 01/25/2013 0620   K 3.9 01/25/2013 0620   CL 103 01/25/2013 0620   CO2 28 01/25/2013 0620   GLUCOSE 109* 01/25/2013 0620   BUN 24* 01/25/2013 0620   CREATININE 1.22 01/25/2013 0620   CREATININE 1.38* 01/07/2013 0945   CALCIUM 9.3 01/25/2013 0620   GFRNONAA 56* 01/25/2013 0620   GFRAA 65* 01/25/2013 0620     ASSESSMENT AND PLAN Exertional angina His symptoms are indeed quite convincing for effort angina. We discussed a variety of options for further workup and treatment. He could simply add antianginal medications. There is no evidence of significant resting ischemia by clinical history or ECG. Another option would be to perform a nuclear perfusion study to evaluate the extent of the abnormality. Finally, it is not unreasonable to proceed directly to cardiac catheterization which would allow the option for revascularization at the same time.. he asked several questions and clearly prefers the more conservative approach, at least for the time being. Will add isosorbide mononitrate 30 mg daily.   CAD- S/P CABG x 5 (LIMA-LAD, SVG-ramus intermedius, SVG-OM1, SVG to distal RCA, SVG-PDA) 01/21/13 3 operatively he had 50% stenosis in the left main coronary artery and a  high-grade ulcerated mid right coronary artery stenosis as well as stenosis in the proximal posterior descending artery and first diagonal artery and the major oblique marginal artery. All territories with significant stenosis or appropriately revascularized.   HYPERTENSION Good control. There is room for additional antianginal therapy. Note that he's actually taking metoprolol 25 mg in a.m. and 37.5 mg in p.m.  HYPERLIPIDEMIA So far he seems to be tolerating rechallenge with statins reasonably well.  DIABETES MELLITUS, TYPE II Excellent glycemic control, A1c 6.1%  No orders of the defined types were placed in this encounter.   Meds ordered this encounter  Medications  . isosorbide mononitrate (IMDUR) 30 MG 24 hr tablet    Sig: Take 1 tablet (30 mg total) by mouth daily.    Dispense:  30 tablet    Refill:  3    Charles Harling  Thurmon Fair, MD, [facility] [phone] office [phone] pager

## 2013-04-07 NOTE — Assessment & Plan Note (Signed)
Good control. There is room for additional antianginal therapy. Note that he's actually taking metoprolol 25 mg in a.m. and 37.5 mg in p.m.

## 2013-04-07 NOTE — Assessment & Plan Note (Signed)
3 operatively he had 50% stenosis in the left main coronary artery and a high-grade ulcerated mid right coronary artery stenosis as well as stenosis in the proximal posterior descending artery and first diagonal artery and the major oblique marginal artery. All territories with significant stenosis or appropriately revascularized.

## 2013-04-07 NOTE — Assessment & Plan Note (Signed)
Excellent glycemic control, A1c 6.1%

## 2013-04-07 NOTE — Assessment & Plan Note (Addendum)
His symptoms are indeed quite convincing for effort angina. We discussed a variety of options for further workup and treatment. He could simply add antianginal medications. There is no evidence of significant resting ischemia by clinical history or ECG. Another option would be to perform a nuclear perfusion study to evaluate the extent of the abnormality. Finally, it is not unreasonable to proceed directly to cardiac catheterization which would allow the option for revascularization at the same time.. he asked several questions and clearly prefers the more conservative approach, at least for the time being. Will add isosorbide mononitrate 30 mg daily.

## 2013-04-07 NOTE — Assessment & Plan Note (Signed)
So far he seems to be tolerating rechallenge with statins reasonably well.

## 2013-04-09 ENCOUNTER — Encounter: Payer: Self-pay | Admitting: Cardiovascular Disease

## 2013-04-10 ENCOUNTER — Encounter (HOSPITAL_COMMUNITY)
Admission: RE | Admit: 2013-04-10 | Discharge: 2013-04-10 | Disposition: A | Discharge status: Home / Self Care | Payer: Medicare Other | Source: Ambulatory Visit | Attending: Cardiovascular Disease | Admitting: Cardiovascular Disease

## 2013-04-10 DIAGNOSIS — Z951 Presence of aortocoronary bypass graft: Secondary | ICD-10-CM | POA: Insufficient documentation

## 2013-04-10 DIAGNOSIS — Z8673 Personal history of transient ischemic attack (TIA), and cerebral infarction without residual deficits: Secondary | ICD-10-CM | POA: Insufficient documentation

## 2013-04-10 DIAGNOSIS — Z5189 Encounter for other specified aftercare: Secondary | ICD-10-CM | POA: Insufficient documentation

## 2013-04-10 DIAGNOSIS — I2510 Atherosclerotic heart disease of native coronary artery without angina pectoris: Secondary | ICD-10-CM | POA: Insufficient documentation

## 2013-04-12 ENCOUNTER — Encounter (HOSPITAL_COMMUNITY): Payer: Medicare Other

## 2013-04-15 ENCOUNTER — Encounter (HOSPITAL_COMMUNITY)
Admission: RE | Admit: 2013-04-15 | Discharge: 2013-04-15 | Disposition: A | Discharge status: Home / Self Care | Payer: Medicare Other | Source: Ambulatory Visit | Attending: Cardiovascular Disease | Admitting: Cardiovascular Disease

## 2013-04-17 ENCOUNTER — Encounter (HOSPITAL_COMMUNITY)
Admission: RE | Admit: 2013-04-17 | Discharge: 2013-04-17 | Disposition: A | Discharge status: Home / Self Care | Payer: Medicare Other | Source: Ambulatory Visit | Attending: Cardiovascular Disease | Admitting: Cardiovascular Disease

## 2013-04-17 LAB — GLUCOSE, CAPILLARY
Glucose-Capillary: 96 mg/dL (ref 70–99)
Glucose-Capillary: 80 mg/dL (ref 70–99)

## 2013-04-19 ENCOUNTER — Encounter (HOSPITAL_COMMUNITY)
Admission: RE | Admit: 2013-04-19 | Discharge: 2013-04-19 | Disposition: A | Discharge status: Home / Self Care | Payer: Medicare Other | Source: Ambulatory Visit | Attending: Cardiovascular Disease | Admitting: Cardiovascular Disease

## 2013-04-22 ENCOUNTER — Encounter (HOSPITAL_COMMUNITY)
Admission: RE | Admit: 2013-04-22 | Discharge: 2013-04-22 | Disposition: A | Discharge status: Home / Self Care | Payer: Medicare Other | Source: Ambulatory Visit | Attending: Cardiovascular Disease | Admitting: Cardiovascular Disease

## 2013-04-23 ENCOUNTER — Ambulatory Visit: Payer: Medicare Other | Admitting: Cardiovascular Disease

## 2013-04-24 ENCOUNTER — Encounter (HOSPITAL_COMMUNITY)
Admission: RE | Admit: 2013-04-24 | Discharge: 2013-04-24 | Disposition: A | Discharge status: Home / Self Care | Payer: Medicare Other | Source: Ambulatory Visit | Attending: Cardiovascular Disease | Admitting: Cardiovascular Disease

## 2013-04-24 NOTE — Progress Notes (Signed)
During exercise at cardiac rehab today while walking on treadmill pt c/o chest discomfort, rates 3-4/10 radiates to back.  Relieved with decreased workload.  Pt did not reproduce symptoms on other stations or pieces of equipment.  Pt states he felt well yesterday and  was able to be active without discomfort.  Pt is discouraged about the amount of pain he has with activity.  Pt instructed to always stop exercise if pain not relieved with decreased workload and proper use of NTG, including when to call 911. Pt has appt scheduled Friday 04/26/2013.  Dr. Royann Shivers made aware of pt continued symptoms with exercise

## 2013-04-26 ENCOUNTER — Encounter (HOSPITAL_COMMUNITY)
Admission: RE | Admit: 2013-04-26 | Discharge: 2013-04-26 | Disposition: A | Discharge status: Home / Self Care | Payer: Medicare Other | Source: Ambulatory Visit | Attending: Cardiovascular Disease | Admitting: Cardiovascular Disease

## 2013-04-26 ENCOUNTER — Ambulatory Visit (INDEPENDENT_AMBULATORY_CARE_PROVIDER_SITE_OTHER): Payer: Medicare Other | Admitting: Cardiovascular Disease

## 2013-04-26 ENCOUNTER — Encounter: Payer: Self-pay | Admitting: Cardiovascular Disease

## 2013-04-26 ENCOUNTER — Encounter (HOSPITAL_COMMUNITY): Payer: Self-pay | Admitting: Pharmacist

## 2013-04-26 VITALS — BP 126/84 | HR 60 | Ht 72.0 in | Wt 183.1 lb

## 2013-04-26 DIAGNOSIS — D689 Coagulation defect, unspecified: Secondary | ICD-10-CM

## 2013-04-26 DIAGNOSIS — I209 Angina pectoris, unspecified: Secondary | ICD-10-CM

## 2013-04-26 DIAGNOSIS — I10 Essential (primary) hypertension: Secondary | ICD-10-CM

## 2013-04-26 DIAGNOSIS — I208 Other forms of angina pectoris: Secondary | ICD-10-CM

## 2013-04-26 DIAGNOSIS — R5381 Other malaise: Secondary | ICD-10-CM

## 2013-04-26 DIAGNOSIS — R079 Chest pain, unspecified: Secondary | ICD-10-CM

## 2013-04-26 DIAGNOSIS — E119 Type 2 diabetes mellitus without complications: Secondary | ICD-10-CM

## 2013-04-26 DIAGNOSIS — Z79899 Other long term (current) drug therapy: Secondary | ICD-10-CM

## 2013-04-26 NOTE — Assessment & Plan Note (Addendum)
He has occasional good days when he can walk without discomfort, but episodes of angina occur on most days when he tries to walk. They never occur at rest. The threshold for occurrence of angina has not really changed. He seems to be developing complaints of fatigue and dyspnea headaches, likely related to his antianginal medications. At this point after some discussion he would like to proceed with coronary angiography to clarify the situation. If an option for revascularization is identified at the time of diagnostic cath he would like to go ahead with angioplasty and stent. This procedure has been fully reviewed with the patient and informed consent has been obtained. This time his coronary angiography will be performed via the femoral approach, to allow optimal visualization of bypasses.

## 2013-04-26 NOTE — Assessment & Plan Note (Signed)
Borderline renal function with a GFR estimated around 60 mL per minute. Try to minimize the amount of contrast used and provided good preoperative hydration with intravenous fluids.

## 2013-04-26 NOTE — Progress Notes (Signed)
Patient ID: Charles [name], male   DOB: 1935/04/14, 78 y.o.   MRN: [id number]     Reason for office visit F/U CAD p CABG with persistent exertional angina pectoris  Now 3 months s/p CABG x 5 (LIMA-LAD, SVG-ramus intermedius, SVG-OM1, SVG to distal RCA, SVG-PDA) 01/21/13., He continues to have exertional chest pain when he walks up a slight hill. This happens predictably with exercise and resolves after 3-4 minutes of rest. He is unable to resume exercise and again angina will occur. He has not taken nitroglycerin. At cardiac rehabilitation he avoids excessive incline on the treadmill and has not had angina. It does happen whenever he tries to "push it".  After starting isosorbide mononitrate he developed a headache which got a little better after one week, without noticeable improvement in the level of angina. The increased dose of beta blocker has been associated with some degree of fatigue.  Pre operatively he had 50% stenosis in the left main coronary artery and a high-grade ulcerated mid right coronary artery stenosis as well as stenosis in the proximal posterior descending artery and first diagonal artery and the major oblique marginal artery. All territories with significant stenosis appear to have been appropriately revascularized.     Allergies  Allergen Reactions  . Atorvastatin     REACTION: myalgia  . Ezetimibe     REACTION: dizzy  . Lovastatin     REACTION: weakness  . Percocet [Oxycodone-Acetaminophen] Other (See Comments)    hallucinations    Current Outpatient Prescriptions  Medication Sig Dispense Refill  . aspirin EC 325 MG EC tablet Take 1 tablet (325 mg total) by mouth daily.  30 tablet  0  . Cholecalciferol (VITAMIN D3) 2000 UNITS TABS Take 2,000 Units by mouth daily.      . Coenzyme Q10 (COQ10) 100 MG CAPS Take 100 mg by mouth daily.      . finasteride (PROSCAR) 5 MG tablet Take 5 mg by mouth daily.      . isosorbide mononitrate (IMDUR) 30 MG 24 hr tablet Take 1  tablet (30 mg total) by mouth daily.  30 tablet  3  . KRILL OIL 1000 MG CAPS Take 1,000 mg by mouth daily.       Marland Kitchen losartan (COZAAR) 50 MG tablet Take 1 tablet (50 mg total) by mouth daily.  90 tablet  3  . metoprolol tartrate (LOPRESSOR) 25 MG tablet Takes 1.5 tablets (37.5mg  total) every morning. Takes 1 tablet (25mg  total) every evening.      Marland Kitchen OVER THE COUNTER MEDICATION Take 1 tablet by mouth daily. Macular degeneration vitamin.      . pravastatin (PRAVACHOL) 40 MG tablet Take 1 tablet (40 mg total) by mouth every evening.  30 tablet  6  . acetaminophen (TYLENOL) 325 MG tablet Take 650 mg by mouth every 6 (six) hours as needed for pain.       No current facility-administered medications for this visit.    Past Medical History  Diagnosis Date  . Hypertension   . Hyperlipidemia     diet controlled, statins and zetia intolerant  . Retinopathy     mild glaucomea  . Transient ischemic attack 1999  . Histoplasmosis   . Chronic cough 08/2009    -allergy profile- june 2,2011- neg. -Max GERD rx/ off oils January 07 2010 x 3 weeks only but no benefit. - Sinus CT rec July 11,2011- refused due to claustrophobia -MCT neg for asthma 03/01/10 -Add 1st Gen H1 July 11,2011- better  .  Macular degeneration   . PONV (postoperative nausea and vomiting)     extreme nausea  . Coronary artery disease   . Diabetes mellitus     no meds    Past Surgical History  Procedure Laterality Date  . Cervical spine surgery  2002    plate  . Cholecystectomy  1985  . Coronary artery bypass graft N/A 01/21/2013    Procedure: Coronary Arery Bypass Grafting  Times Five Using Left Internal Mammary Artery and Right Saphenous Leg Vein Harvested Endoscopically;  Surgeon: Loreli Slot, MD;  Location: [facility] OR;  Service: Open Heart Surgery;  Laterality: N/A;    Family History  Problem Relation Age of Onset  . Coronary artery disease Neg Hx   . Stroke Neg Hx   . Colon cancer Neg Hx   . Prostate cancer Neg Hx   .  Atopy Neg Hx   . Asthma Neg Hx   . Hypertension Mother   . Hypertension Father   . Diabetes Father   . Cancer Father     colon  . Hypertension Brother   . Cancer Brother     lung  . Hypertension Sister   . Cancer Sister     lung  . Diabetes Brother   . Diabetes Sister   . Diabetes      Grandparents   . Lung cancer Brother   . Lung cancer Sister     History   Social History  . Marital Status: Married    Spouse Name: N/A    Number of Children: 2  . Years of Education: N/A   Occupational History  . reitred    Social History Main Topics  . Smoking status: Former Smoker -- 1.00 packs/day for 10 years    Types: Cigarettes    Quit date: 01/17/1977  . Smokeless tobacco: Never Used     Comment: Stopped at age 63   . Alcohol Use: Yes     Comment: seldom  . Drug Use: No  . Sexual Activity: Not on file   Other Topics Concern  . Not on file   Social History Narrative  . No narrative on file    Review of systems: The patient specifically denies any chest pain at rest, dyspnea at rest or with exertion, orthopnea, paroxysmal nocturnal dyspnea, syncope, palpitations, focal neurological deficits, intermittent claudication, lower extremity edema, unexplained weight gain, cough, hemoptysis or wheezing.  The patient also denies abdominal pain, nausea, vomiting, dysphagia, diarrhea, constipation, polyuria, polydipsia, dysuria, hematuria, frequency, urgency, abnormal bleeding or bruising, fever, chills, unexpected weight changes, mood swings, change in skin or hair texture, change in voice quality, auditory or visual problems, allergic reactions or rashes, new musculoskeletal complaints other than usual "aches and pains".   PHYSICAL EXAM BP 126/84  Pulse 60  Ht 6' (1.829 m)  Wt 183 lb 1.6 oz (83.054 kg)  BMI 24.83 kg/m2 General: Alert, oriented x3, no distress  Head: no evidence of trauma, PERRL, EOMI, no exophtalmos or lid lag, no myxedema, no xanthelasma; normal ears, nose  and oropharynx  Neck: normal jugular venous pulsations and no hepatojugular reflux; brisk carotid pulses without delay and no carotid bruits  Chest: clear to auscultation, no signs of consolidation by percussion or palpation, normal fremitus, symmetrical and full respiratory excursions; well-healed sternotomy scar  Cardiovascular: normal position and quality of the apical impulse, regular rhythm, normal first and second heart sounds, no murmurs, rubs or gallops  Abdomen: no tenderness or distention, no masses by palpation,  no abnormal pulsatility or arterial bruits, normal bowel sounds, no hepatosplenomegaly  Extremities: no clubbing, cyanosis or edema; 2+ radial, ulnar and brachial pulses bilaterally; 2+ right femoral, posterior tibial and dorsalis pedis pulses; 2+ left femoral, posterior tibial and dorsalis pedis pulses; no subclavian or femoral bruits  Neurological: grossly nonfocal   EKG: Sinus rhythm, incomplete right bundle branch block, no acute repolarization abnormalities  Lipid Panel     Component Value Date/Time   CHOL 163 02/26/2013 0806   TRIG 105.0 02/26/2013 0806   HDL 45.10 02/26/2013 0806   CHOLHDL 4 02/26/2013 0806   VLDL 21.0 02/26/2013 0806   LDLCALC 97 02/26/2013 0806    BMET    Component Value Date/Time   NA 139 01/25/2013 0620   K 3.9 01/25/2013 0620   CL 103 01/25/2013 0620   CO2 28 01/25/2013 0620   GLUCOSE 109* 01/25/2013 0620   BUN 24* 01/25/2013 0620   CREATININE 1.22 01/25/2013 0620   CREATININE 1.38* 01/07/2013 0945   CALCIUM 9.3 01/25/2013 0620   GFRNONAA 56* 01/25/2013 0620   GFRAA 65* 01/25/2013 0620     ASSESSMENT AND PLAN Exertional angina He has occasional good days when he can walk without discomfort, but episodes of angina occur on most days when he tries to walk. They never occur at rest. The threshold for occurrence of angina has not really changed. He seems to be developing complaints of fatigue and dyspnea headaches, likely related to his antianginal  medications. At this point after some discussion he would like to proceed with coronary angiography to clarify the situation. If an option for revascularization is identified at the time of diagnostic cath he would like to go ahead with angioplasty and stent. This procedure has been fully reviewed with the patient and informed consent has been obtained. This time his coronary angiography will be performed via the femoral approach, to allow optimal visualization of bypasses.  CAD- S/P CABG x 5 (LIMA-LAD, SVG-ramus intermedius, SVG-OM1, SVG to distal RCA, SVG-PDA) 01/21/13 .   HYPERTENSION There is good documentation that his blood pressure is excellent at baseline and remains normal during activity at rehabilitation. His heart rate rarely exceeds 100 beats a minute during exercise.  DIABETES MELLITUS, TYPE II Borderline renal function with a GFR estimated around 60 mL per minute. Try to minimize the amount of contrast used and provided good preoperative hydration with intravenous fluids.   Orders Placed This Encounter  Procedures  . Comp Met (CMET)  . TSH  . INR/PT  . PTT  . CBC  . EKG 12-Lead  . LEFT HEART CATHETERIZATION WITH CORONARY/GRAFT ANGIOGRAM   No orders of the defined types were placed in this encounter.    Junious Silk, MD, [facility] [phone] office [phone] pager

## 2013-04-26 NOTE — Assessment & Plan Note (Signed)
There is good documentation that his blood pressure is excellent at baseline and remains normal during activity at rehabilitation. His heart rate rarely exceeds 100 beats a minute during exercise.

## 2013-04-26 NOTE — Patient Instructions (Addendum)
Your physician has requested that you have a cardiac catheterization. Cardiac catheterization is used to diagnose and/or treat various heart conditions. Doctors may recommend this procedure for a number of different reasons. The most common reason is to evaluate chest pain. Chest pain can be a symptom of coronary artery disease (CAD), and cardiac catheterization can show whether plaque is narrowing or blocking your heart's arteries. This procedure is also used to evaluate the valves, as well as measure the blood flow and oxygen levels in different parts of your heart. For further information please visit [email or web address] Please follow instruction sheet, as given.  Your physician recommends that you return for lab work within 7 days of your procedure date.

## 2013-04-29 ENCOUNTER — Other Ambulatory Visit: Payer: Self-pay | Admitting: Cardiovascular Disease

## 2013-04-29 ENCOUNTER — Encounter (HOSPITAL_COMMUNITY)
Admission: RE | Admit: 2013-04-29 | Discharge: 2013-04-29 | Disposition: A | Discharge status: Home / Self Care | Payer: Medicare Other | Source: Ambulatory Visit | Attending: Cardiovascular Disease | Admitting: Cardiovascular Disease

## 2013-04-29 DIAGNOSIS — I2510 Atherosclerotic heart disease of native coronary artery without angina pectoris: Secondary | ICD-10-CM

## 2013-04-29 DIAGNOSIS — I209 Angina pectoris, unspecified: Secondary | ICD-10-CM

## 2013-04-30 ENCOUNTER — Other Ambulatory Visit: Payer: Self-pay | Admitting: Thoracic Surgery (Cardiothoracic Vascular Surgery)

## 2013-04-30 ENCOUNTER — Telehealth: Payer: Self-pay | Admitting: Cardiovascular Disease

## 2013-04-30 MED ORDER — METOPROLOL TARTRATE 25 MG PO TABS: ORAL_TABLET | ORAL | Status: DC

## 2013-04-30 NOTE — Telephone Encounter (Signed)
Scheduled for Cath on 05-07-11. Does he need to have a xray  Before that?

## 2013-04-30 NOTE — Telephone Encounter (Signed)
Britta Mccreedy, CMA notified and advised no CXR needed.    Returned call and pt informed.  Pt verbalized understanding and agreed w/ plan.  Pt also stated he needed refill on Lopressor.  Pt informed refill will be sent for 90-day supply per his request.  Pt verbalized understanding.  Refill(s) sent to pharmacy.

## 2013-05-01 ENCOUNTER — Encounter (HOSPITAL_COMMUNITY)
Admission: RE | Admit: 2013-05-01 | Discharge: 2013-05-01 | Disposition: A | Discharge status: Home / Self Care | Payer: Medicare Other | Source: Ambulatory Visit | Attending: Cardiovascular Disease | Admitting: Cardiovascular Disease

## 2013-05-01 LAB — CBC
HCT: 43.5 % (ref 39.0–52.0)
Hemoglobin: 14.5 g/dL (ref 13.0–17.0)
MCV: 90.4 fL (ref 78.0–100.0)
RBC: 4.81 MIL/uL (ref 4.22–5.81)
WBC: 8.2 10*3/uL (ref 4.0–10.5)

## 2013-05-01 LAB — COMPREHENSIVE METABOLIC PANEL
ALT: 15 U/L (ref 0–53)
AST: 18 U/L (ref 0–37)
CO2: 27 mEq/L (ref 19–32)
Creat: 1.37 mg/dL — ABNORMAL HIGH (ref 0.50–1.35)
Sodium: 142 mEq/L (ref 135–145)
Total Bilirubin: 0.8 mg/dL (ref 0.3–1.2)
Total Protein: 7.1 g/dL (ref 6.0–8.3)

## 2013-05-01 LAB — APTT: aPTT: 34 seconds (ref 24–37)

## 2013-05-03 ENCOUNTER — Encounter (HOSPITAL_COMMUNITY)
Admission: RE | Admit: 2013-05-03 | Discharge: 2013-05-03 | Disposition: A | Discharge status: Home / Self Care | Payer: Medicare Other | Source: Ambulatory Visit | Attending: Cardiovascular Disease | Admitting: Cardiovascular Disease

## 2013-05-06 ENCOUNTER — Ambulatory Visit (HOSPITAL_COMMUNITY)
Admission: RE | Admit: 2013-05-06 | Discharge: 2013-05-07 | Disposition: A | Discharge status: Home / Self Care | Payer: Medicare Other | Source: Ambulatory Visit | Attending: Cardiovascular Disease | Admitting: Cardiovascular Disease

## 2013-05-06 ENCOUNTER — Encounter (HOSPITAL_COMMUNITY)
Admission: RE | Disposition: A | Discharge status: Home / Self Care | Payer: Self-pay | Source: Ambulatory Visit | Attending: Cardiovascular Disease

## 2013-05-06 ENCOUNTER — Encounter (HOSPITAL_COMMUNITY): Payer: Medicare Other

## 2013-05-06 DIAGNOSIS — I25810 Atherosclerosis of coronary artery bypass graft(s) without angina pectoris: Secondary | ICD-10-CM | POA: Diagnosis present

## 2013-05-06 DIAGNOSIS — E119 Type 2 diabetes mellitus without complications: Secondary | ICD-10-CM | POA: Diagnosis present

## 2013-05-06 DIAGNOSIS — I10 Essential (primary) hypertension: Secondary | ICD-10-CM | POA: Diagnosis present

## 2013-05-06 DIAGNOSIS — I209 Angina pectoris, unspecified: Secondary | ICD-10-CM

## 2013-05-06 DIAGNOSIS — E785 Hyperlipidemia, unspecified: Secondary | ICD-10-CM | POA: Diagnosis present

## 2013-05-06 DIAGNOSIS — I2510 Atherosclerotic heart disease of native coronary artery without angina pectoris: Secondary | ICD-10-CM

## 2013-05-06 DIAGNOSIS — R931 Abnormal findings on diagnostic imaging of heart and coronary circulation: Secondary | ICD-10-CM | POA: Diagnosis present

## 2013-05-06 DIAGNOSIS — Z95820 Peripheral vascular angioplasty status with implants and grafts: Secondary | ICD-10-CM

## 2013-05-06 DIAGNOSIS — I208 Other forms of angina pectoris: Secondary | ICD-10-CM

## 2013-05-06 HISTORY — PX: PERCUTANEOUS CORONARY STENT INTERVENTION (PCI-S): SHX5485

## 2013-05-06 HISTORY — PX: LEFT HEART CATHETERIZATION WITH CORONARY/GRAFT ANGIOGRAM: SHX5450

## 2013-05-06 LAB — POCT ACTIVATED CLOTTING TIME: Activated Clotting Time: 345 seconds

## 2013-05-06 LAB — GLUCOSE, CAPILLARY: Glucose-Capillary: 104 mg/dL — ABNORMAL HIGH (ref 70–99)

## 2013-05-06 SURGERY — LEFT HEART CATHETERIZATION WITH CORONARY/GRAFT ANGIOGRAM
Anesthesia: LOCAL

## 2013-05-06 MED ORDER — FENTANYL CITRATE 0.05 MG/ML IJ SOLN
50.0000 ug | Freq: Once | INTRAMUSCULAR | Status: AC
  Administered 2013-05-06: 22:00:00 50 ug via INTRAVENOUS
  Filled 2013-05-06: qty 2

## 2013-05-06 MED ORDER — SODIUM CHLORIDE 0.9 % IV SOLN
INTRAVENOUS | Status: DC
  Administered 2013-05-06: 09:00:00 via INTRAVENOUS

## 2013-05-06 MED ORDER — FENTANYL CITRATE 0.05 MG/ML IJ SOLN
INTRAMUSCULAR | Status: AC
  Filled 2013-05-06: qty 2

## 2013-05-06 MED ORDER — SODIUM CHLORIDE 0.9 % IJ SOLN: 3.0000 mL | INTRAMUSCULAR | Status: DC | PRN

## 2013-05-06 MED ORDER — HYDRALAZINE HCL 20 MG/ML IJ SOLN
10.0000 mg | INTRAMUSCULAR | Status: DC | PRN
  Administered 2013-05-06: 10 mg via INTRAVENOUS
  Filled 2013-05-06: qty 1

## 2013-05-06 MED ORDER — ISOSORBIDE MONONITRATE ER 30 MG PO TB24: 30.0000 mg | ORAL_TABLET | Freq: Every day | ORAL | Status: DC

## 2013-05-06 MED ORDER — LOSARTAN POTASSIUM 50 MG PO TABS
50.0000 mg | ORAL_TABLET | Freq: Every day | ORAL | Status: DC
  Administered 2013-05-07: 50 mg via ORAL
  Filled 2013-05-06 (×2): qty 1

## 2013-05-06 MED ORDER — NITROGLYCERIN 0.2 MG/ML ON CALL CATH LAB
INTRAVENOUS | Status: AC
  Filled 2013-05-06: qty 1

## 2013-05-06 MED ORDER — ISOSORBIDE MONONITRATE ER 30 MG PO TB24
30.0000 mg | ORAL_TABLET | Freq: Every day | ORAL | Status: DC
  Filled 2013-05-06 (×2): qty 1

## 2013-05-06 MED ORDER — HYDRALAZINE HCL 20 MG/ML IJ SOLN
10.0000 mg | Freq: Once | INTRAMUSCULAR | Status: AC
  Administered 2013-05-06: 10 mg via INTRAVENOUS
  Filled 2013-05-06: qty 1

## 2013-05-06 MED ORDER — HEPARIN (PORCINE) IN NACL 2-0.9 UNIT/ML-% IJ SOLN
INTRAMUSCULAR | Status: AC
  Filled 2013-05-06: qty 1000

## 2013-05-06 MED ORDER — BIVALIRUDIN 250 MG IV SOLR
INTRAVENOUS | Status: AC
  Filled 2013-05-06: qty 250

## 2013-05-06 MED ORDER — SODIUM CHLORIDE 0.9 % IV SOLN
1.0000 mL/kg/h | INTRAVENOUS | Status: AC
  Administered 2013-05-06: 1 mL/kg/h via INTRAVENOUS

## 2013-05-06 MED ORDER — NITROGLYCERIN IN D5W 200-5 MCG/ML-% IV SOLN
INTRAVENOUS | Status: AC
  Filled 2013-05-06: qty 250

## 2013-05-06 MED ORDER — DIAZEPAM 5 MG PO TABS
5.0000 mg | ORAL_TABLET | ORAL | Status: AC
  Administered 2013-05-06: 5 mg via ORAL

## 2013-05-06 MED ORDER — FINASTERIDE 5 MG PO TABS
5.0000 mg | ORAL_TABLET | Freq: Every day | ORAL | Status: DC
  Administered 2013-05-07: 5 mg via ORAL
  Filled 2013-05-06: qty 1

## 2013-05-06 MED ORDER — METOPROLOL TARTRATE 12.5 MG HALF TABLET: 37.5000 mg | ORAL_TABLET | Freq: Two times a day (BID) | ORAL | Status: DC

## 2013-05-06 MED ORDER — TICAGRELOR 90 MG PO TABS
90.0000 mg | ORAL_TABLET | Freq: Two times a day (BID) | ORAL | Status: DC
  Administered 2013-05-06 – 2013-05-07 (×2): 90 mg via ORAL
  Filled 2013-05-06 (×3): qty 1

## 2013-05-06 MED ORDER — HYDRALAZINE HCL 20 MG/ML IJ SOLN
10.0000 mg | Freq: Once | INTRAMUSCULAR | Status: AC
  Administered 2013-05-06: 10 mg via INTRAVENOUS

## 2013-05-06 MED ORDER — ONDANSETRON HCL 4 MG/2ML IJ SOLN
4.0000 mg | Freq: Four times a day (QID) | INTRAMUSCULAR | Status: DC | PRN
  Administered 2013-05-06 (×2): 4 mg via INTRAVENOUS
  Filled 2013-05-06 (×2): qty 2

## 2013-05-06 MED ORDER — SODIUM CHLORIDE 0.9 % IV SOLN
1.7500 mg/kg/h | INTRAVENOUS | Status: DC
  Filled 2013-05-06: qty 250

## 2013-05-06 MED ORDER — TICAGRELOR 90 MG PO TABS
ORAL_TABLET | ORAL | Status: AC
  Filled 2013-05-06: qty 2

## 2013-05-06 MED ORDER — METOPROLOL TARTRATE 25 MG PO TABS
37.5000 mg | ORAL_TABLET | Freq: Two times a day (BID) | ORAL | Status: DC
  Administered 2013-05-06: 37.5 mg via ORAL
  Filled 2013-05-06 (×5): qty 1

## 2013-05-06 MED ORDER — MIDAZOLAM HCL 5 MG/5ML IJ SOLN
INTRAMUSCULAR | Status: AC
  Filled 2013-05-06: qty 5

## 2013-05-06 MED ORDER — SODIUM CHLORIDE 0.9 % IJ SOLN: 3.0000 mL | Freq: Two times a day (BID) | INTRAMUSCULAR | Status: DC

## 2013-05-06 MED ORDER — SODIUM CHLORIDE 0.9 % IV SOLN
0.2500 mg/kg/h | INTRAVENOUS | Status: AC
  Filled 2013-05-06: qty 250

## 2013-05-06 MED ORDER — LOSARTAN POTASSIUM 50 MG PO TABS: 50.0000 mg | ORAL_TABLET | Freq: Every day | ORAL | Status: DC

## 2013-05-06 MED ORDER — LIDOCAINE HCL (PF) 1 % IJ SOLN
INTRAMUSCULAR | Status: AC
  Filled 2013-05-06: qty 30

## 2013-05-06 MED ORDER — DIAZEPAM 5 MG PO TABS
ORAL_TABLET | ORAL | Status: AC
  Administered 2013-05-06: 5 mg via ORAL
  Filled 2013-05-06: qty 1

## 2013-05-06 MED ORDER — SODIUM CHLORIDE 0.9 % IV SOLN: 250.0000 mL | INTRAVENOUS | Status: DC | PRN

## 2013-05-06 MED ORDER — ACETAMINOPHEN 325 MG PO TABS
650.0000 mg | ORAL_TABLET | Freq: Four times a day (QID) | ORAL | Status: DC | PRN
Start: 2013-05-06 — End: 2013-05-07
  Filled 2013-05-06: qty 2

## 2013-05-06 NOTE — CV Procedure (Signed)
PERCUTANEOUS coronary INTERVENTION REPORT  NAME:  Charles [name]   MRN: [id number] DOB:  06-15-1935   ADMIT DATE: 05/06/2013 Procedure Date: 05/06/2013  INTERVENTIONAL CARDIOLOGIST: Marykay Lex, M.D., MS PRIMARY CARE PROVIDER: [name], MD PRIMARY CARDIOLOGIST: Thurmon Fair, MD  PATIENT:  Charles [name] is a 78 y.o. male with PMH of CABG - seen by Dr. Royann Shivers for complaints typical for exertional angina pectoris with a slow pattern of worsening. Nuclear stress test showed evidence of ischemia. He is here for diagnostic left heart catheterization and possible percutaneous revascularization if appropriate.  He underwent diagnostic cardiac catheterization today by Dr. Royann Shivers demonstrated an occluded sequential graft to the ramus intermedius and OM1.  The native ramus intermedius and a 85-90% stenosis and was larger the 2 vessels.  OM 2 has a more distal 70% lesion with a entire proximal circumflex being tortuous and moderately diseased.  The RCA had sequential lesions with a vein graft going to a severely diseased distal target. After reviewing the films with Dr. tour, we decided that the best potential percutaneous intervention option would be the ramus intermedius.  The plan would then 2 reassess how he is with optimal medical therapy, and consider possible staged PCI of the RCA plus or minus circumflex proximal OM.  PRE-OPERATIVE DIAGNOSIS:    Unstable angina / crescendo  Severe multivessel native CAD with culprit lesion being the proximal ramus intermedius 85-90% stenosis  PROCEDURES PERFORMED:    PERCUTANEOUS CORONARY INTERVENTION of the proximal RAMUS INTERMEDIUS using a single PROMUS PREMIER DES 2.25 mm 24 mm, with rescue PTCA of an inferior branch.  PROCEDURE:Consent:  Risks of procedure as well as the alternatives and risks of each were explained to the (patient/caregiver).  Consent for procedure obtained. Consent for signed by MD and patient with RN witness -- placed on  chart.  Consent was obtained by Dr. Royann Shivers for both diagnostic and interventional procedure   PROCEDURE: The patient was kept in the same Cath Lab room, the 5 French sheath was upsized to 6 [place] sheath by Dr. Royann Shivers, 180 mg Brilinta was administered as was 80 Max bolus given with drip started. Time Out: Verified patient identification, verified procedure, site/side was marked, verified correct patient position, special equipment/implants available, medications/allergies/relevent history reviewed, required imaging and test results available.  Performed  SHEATH:  6 French sheaths sutured in the RFA to be removed 2 hours after completion Angiomax infusion with manual pressure.r  MEDICATIONS:  Sedation:  Combined total from diagnostic intervention 5 mg IV Versed, 200 mcg IV fentanyl ;   Omnipaque Contrast: Combined total 235 mL (150 for interventional to 85 for diagnostic)  Anticoagulation:  Angiomax Bolus & drip  Anti-Platelet Agent:  Belinda 180 mg  Hemodynamics:  Central Aortic / Mean Pressures: 17378 mmHg; 109 mmHg  Coronary Anatomy: Ramus intermedius: Proximal 80-90% lesion in a bend prior to bifurcation.  Percutaneous Coronary Intervention:  Sheath exchanged for 6 Fr Guide: 6 Fr   XB 3.5 Guidewire: Pro-water, followed by Luge Predilation Balloon: Sprinter Legend 2.0 mm x 12 mm;   8 Atm x 15 Sec, 8 Atm x 30 to Sec,  Following initial balloon inflation, with removing the balloon over the wire, the patient abruptly move the right leg leading to loss of guide and wire positioning.  Initial attempts to read wire the vessel with the pro-wire wire was unsuccessful.  At noon luge wire was then used to successfully pass the lesion down into the superior branch of the ramus intermedius.  A short dissection was noted crossing into the distal bifurcation.  At this time the decision made to use a longer than initially planned stent with potential for need to perform rescue PTCA of the  inferior branch.   Stent: Promus Premier DES   2.25 mm x  24 mm; crossing the ramus bifurcation    11  Atm x  30  Sec,-- nominal inflation Post-dilation Balloon: Seymour Euphora  2.5 mm x  15 mm; In the proximal two thirds of the stent    9  Atm x  40 Sec distal (2.45 mm) ,  16 Atm x  30 Sec (2.55 mm)  At this point the superior branch was wired with a pro-water wire, and the luge wire was redirected into the inferior branch.  Emerge Monorail 2.0 mm x8 mm - 2 inflations 6 Atm x 30 Sec, and 1 crossing into the stent at the ostium of the vessel 8 Atm, x 45 Sec  This led to reduction of a post stenting inferior branch ostial stenosis of 90% to less than 20%  Post deployment angiography in multiple views, with and without guidewire in place revealed excellent stent deployment and lesion coverage.  There was no evidence of dissection or perforation. the ostial inferior branch lesion was reduced to less than 20%   PATIENT DISPOSITION:    The patient was transferred to the PACU holding area in a hemodynamicaly stable, chest pain free condition.  The patient tolerated the procedure well, and there were no complications.  EBL:   <  10 ml  The patient was stable before, during, and after the procedure.  POST-OPERATIVE DIAGNOSIS:     Successful complex bifurcation PTCA/stenting of the Ramus Intermedius using a Promus Premier DES 2.5 mm 24 mm stent crossing the inferior branch requiring rescue PTCA of this branch.   Residual circumflex and RCA disease to be managed medically.   PLAN OF CARE:  Continue Angiomax for 2 hours post PCI   Dual Antiplatelet Therapy with aspirin plus Brilinta for a minimum of 1 year   Discharge tomorrow to follow up with Dr. Royann Shivers to continue optimization of medical therapy of his existing disease.     Marykay Lex, M.D., M.S. [facility] [address] [place], [place]  [address]  [phone]  05/06/2013 1:59 PM

## 2013-05-06 NOTE — Op Note (Signed)
Patient Information    Patient Name Sex DOB SSN   Charles [name], Charles [name] Male [birth year] [id number]            Op Note signed by Thurmon Fair, MD at 01/11/2013 3:58 PM    Author: Thurmon Fair, MD Service: Cardiology Author Type: Physician   Filed: 01/11/2013 3:58 PM Note Time: 01/11/2013 3:40 PM         CARDIAC CATHETERIZATION REPORT    Procedures performed:  1. Left heart catheterization  2. Selective coronary angiography  3. Left ventriculography  Reason for procedure:  Stable angina pectoris  Abnormal nuclear stress test/stress echo  Procedure performed by: Thurmon Fair, MD, [facility]  Complications: none  Estimated blood loss: less than 5 mL  History: 78 year old man with complaints typical for exertional angina pectoris with a slow pattern of worsening. Nuclear stress test showed evidence of ischemia. He is here for diagnostic left heart catheterization and possible percutaneous revascularization if appropriate.  Consent: The risks, benefits, and details of the procedure were explained to the patient. Risks including death, MI, stroke, bleeding, limb ischemia, renal failure and allergy were described and accepted by the patient. Informed written consent was obtained prior to proceeding.  Technique: The patient was brought to the cardiac catheterization laboratory in the fasting state. He was prepped and draped in the usual sterile fashion. Local anesthesia with 1% lidocaine was administered to the right wrist area. Using the modified Seldinger technique a 5 French right radial artery sheath was introduced without difficulty. Under fluoroscopic guidance, using 5 Jamaica JL4, JR and angled pigtail catheters, selective cannulation of the left coronary artery, right coronary artery and left ventricle were respectively performed. Several coronary angiograms in a variety of projections were recorded, as well as a left ventriculogram in the RAO projection. Left ventricular pressure  and a pull back to the aorta were recorded. No immediate complications occurred. At the end of the procedure, all catheters were removed. After the procedure, hemostasis will be achieved with manual pressure.  Contrast used: 65 mL Omnipaque  Angiographic Findings:  1. The left main coronary artery exhibited significant atherosclerosis and calcification, including a 50% stenosis in its midportion. It bifurcates in the usual fashion into the left anterior descending artery and left circumflex coronary artery.  2. The left anterior descending artery is a medium size vessel that barely reaches the apex and generates 3 major diagonal branches. There is mostly LIMA flow to the LAD. There is evidence of mild luminal irregularities and mild calcification in the LAD artery. The maximum diameter stenosis is probably 40%, no hemodynamically meaningful stenoses are seen.  The first diagonal branch is very proximal, very large and operates as a ramus intermedius type vessel. The large first diagonal artery has a 80-90% proximal stenosis, it looks worse than on the pre CABG angiogram. 3. The left circumflex coronary artery is a relatively small-size vessel non- dominant vessel that generates only one major oblique marginal artery. There is evidence of moderate luminal irregularities and moderate calcification. There are 2 sequential hemodynamically meaningful stenoses in the proximal third of the oblique marginal artery, which is diffusely diseased. The maximum diameter stenosis is approximately 70%.  4. The right coronary artery is a very large-size dominant vessel that generates a branching posterior lateral ventricular system as well as a very long posterior descending artery that supplies the left ventricular apex. There is evidence of extensive luminal irregularities and moderate calcification. Multiple hemodynamically meaningful stenoses are  seen. There is a 85-90% stenosis in the mid third of the right coronary  artery. There is a 85-90% stenosis in the proximal posterior descending artery, that appears irregular and may be ulcerated. There is a 60-70% stenosis in the mid posterior descending artery. The posterior lateral ventricular branch is relatively free of disease.  5. The LIMA bypass connects to a relatively small LAD artery. It is widely patent with excellent flow. 6. The SVG to the inferior wall appears to have only one anastomosis, to the distal RCA. There is no evidence of a sequential limb to the PDA and this must be occluded at the RCA touchdown. 7. The SVG to the lateral wall (sequential OM - ramus) is occluded at the origin 8. The left ventricle is not injected. There is no aortic valve stenosis by pullback. The left ventricular end-diastolic pressure is 26 mm Hg.     IMPRESSIONS:  Since CABG, there appears to be interval occlusion of the sequential SVG to ramus and OM and of the distal limb of the sequential SVG to RCA and PDA RECOMMENDATION:  PCI-stent of the ramus intermedius.     Thurmon Fair, MD, [facility] [phone] office [phone] pager

## 2013-05-06 NOTE — Progress Notes (Addendum)
Site area: right groin  Site Prior to Removal:  Level 0  Pressure Applied For 20 MINUTES    Minutes Beginning at 2245  Manual:   yes  Patient Status During Pull:  Stable he had been nauseated he did throw up and heart rate elevated then quickly resovled  Post Pull Groin Site:  Level 0  Post Pull Instructions Given:  yes  Post Pull Pulses Present:  yes  Dressing Applied:  yes  Comments:  Delay in anticipated removal time of 1900 due to uncontrolled SBP 160-170 and nausea Dr. Veryl Speak was updated and orders received and followed

## 2013-05-06 NOTE — H&P (Addendum)
Date of Initial H&P: 04/26/2013  History reviewed, patient examined, no change in status, stable for surgery. Diagnostic cardiac cath (+/-PCI-stent) for recurrent exertional angina pectoris early postop. after CABG. coronary artery bypass grafting x5 (left internal mammary artery to left anterior descending, sequential saphenous vein graft to ramus intermedius, and obtuse marginal 1, sequential saphenous vein graft to distal right coronary and posterior descending), Dr. Dorris Fetch 01/21/2013.  This procedure has been fully reviewed with the patient and written informed consent has been obtained.  Thurmon Fair, MD, [facility] [phone] office [phone] pager

## 2013-05-07 DIAGNOSIS — I2510 Atherosclerotic heart disease of native coronary artery without angina pectoris: Secondary | ICD-10-CM

## 2013-05-07 DIAGNOSIS — I25810 Atherosclerosis of coronary artery bypass graft(s) without angina pectoris: Secondary | ICD-10-CM

## 2013-05-07 DIAGNOSIS — Z9889 Other specified postprocedural states: Secondary | ICD-10-CM

## 2013-05-07 DIAGNOSIS — I209 Angina pectoris, unspecified: Secondary | ICD-10-CM

## 2013-05-07 LAB — GLUCOSE, CAPILLARY
Glucose-Capillary: 124 mg/dL — ABNORMAL HIGH (ref 70–99)
Glucose-Capillary: 141 mg/dL — ABNORMAL HIGH (ref 70–99)
Glucose-Capillary: 104 mg/dL — ABNORMAL HIGH (ref 70–99)

## 2013-05-07 LAB — CBC
HCT: 43 % (ref 39.0–52.0)
MCH: 30.5 pg (ref 26.0–34.0)
MCV: 90.5 fL (ref 78.0–100.0)
Platelets: 212 10*3/uL (ref 150–400)
RDW: 13.9 % (ref 11.5–15.5)

## 2013-05-07 LAB — BASIC METABOLIC PANEL
BUN: 20 mg/dL (ref 6–23)
CO2: 24 mEq/L (ref 19–32)
Calcium: 9.6 mg/dL (ref 8.4–10.5)
Creatinine, Ser: 1.22 mg/dL (ref 0.50–1.35)
GFR calc Af Amer: 65 mL/min — ABNORMAL LOW (ref >90)
GFR calc non Af Amer: 56 mL/min — ABNORMAL LOW (ref >90)

## 2013-05-07 MED ORDER — ISOSORBIDE MONONITRATE ER 60 MG PO TB24
60.0000 mg | ORAL_TABLET | Freq: Every day | ORAL | Status: DC
  Administered 2013-05-07: 13:00:00 60 mg via ORAL
  Filled 2013-05-07 (×2): qty 1

## 2013-05-07 MED ORDER — ISOSORBIDE MONONITRATE ER 60 MG PO TB24: 60.0000 mg | ORAL_TABLET | Freq: Every day | ORAL | Status: DC

## 2013-05-07 MED ORDER — METOPROLOL TARTRATE 50 MG PO TABS: 50.0000 mg | ORAL_TABLET | Freq: Two times a day (BID) | ORAL | Status: DC

## 2013-05-07 MED ORDER — TICAGRELOR 90 MG PO TABS: 90.0000 mg | ORAL_TABLET | Freq: Two times a day (BID) | ORAL | Status: DC

## 2013-05-07 MED ORDER — METOPROLOL TARTRATE 50 MG PO TABS
50.0000 mg | ORAL_TABLET | Freq: Two times a day (BID) | ORAL | Status: DC
  Administered 2013-05-07: 09:00:00 50 mg via ORAL
  Filled 2013-05-07 (×2): qty 1

## 2013-05-07 MED ORDER — ASPIRIN 81 MG PO TBEC: 81.0000 mg | DELAYED_RELEASE_TABLET | Freq: Every day | ORAL | Status: DC

## 2013-05-07 MED FILL — Sodium Chloride IV Soln 0.9%: INTRAVENOUS | Qty: 50 | Status: AC

## 2013-05-07 NOTE — Progress Notes (Signed)
    Subjective: No complaints.  Objective: Vital signs in last 24 hours: Temp:  [97.4 F (36.3 C)-98.6 F (37 C)] 97.4 F (36.3 C) (09/30 0737) Pulse Rate:  [61-111] 96 (09/30 0737) Resp:  [16-18] 18 (09/30 0737) BP: (144-192)/(44-172) 172/73 mmHg (09/30 0737) SpO2:  [97 %-99 %] 97 % (09/30 0737) Weight:  [179 lb 0.2 oz (81.2 kg)] 179 lb 0.2 oz (81.2 kg) (09/30 0403) Last BM Date: 05/05/13  Intake/Output from previous day: 09/29 0701 - 09/30 0700 In: 784 [P.O.:120; I.V.:664] Out: 950 [Urine:950] Intake/Output this shift:    Medications Current Facility-Administered Medications  Medication Dose Route Frequency Provider Last Rate Last Dose  . acetaminophen (TYLENOL) tablet 650 mg  650 mg Oral Q6H PRN Ardis Rowan, MD      . finasteride (PROSCAR) tablet 5 mg  5 mg Oral Daily Marykay Lex, MD      . hydrALAZINE (APRESOLINE) injection 10 mg  10 mg Intravenous Q4H PRN Brittainy Simmons, PA-C   10 mg at 05/06/13 1945  . isosorbide mononitrate (IMDUR) 24 hr tablet 30 mg  30 mg Oral Daily Mihai Croitoru, MD      . losartan (COZAAR) tablet 50 mg  50 mg Oral Daily Mihai Croitoru, MD      . metoprolol tartrate (LOPRESSOR) tablet 37.5 mg  37.5 mg Oral BID Mihai Croitoru, MD   37.5 mg at 05/06/13 2119  . ondansetron (ZOFRAN) injection 4 mg  4 mg Intravenous Q6H PRN Marykay Lex, MD   4 mg at 05/06/13 2020  . Ticagrelor (BRILINTA) tablet 90 mg  90 mg Oral BID Marykay Lex, MD   90 mg at 05/06/13 2120    PE: General appearance: alert, cooperative and no distress Lungs: clear to auscultation bilaterally Heart: regular rate and rhythm, S1, S2 normal, no murmur, click, rub or gallop Extremities: No LEE Pulses: 2+ and symmetric Skin: No hematoma, ecchymosis or tenderness at the right groin cath site. Neurologic: Grossly normal  Lab Results:   Recent Labs  05/07/13 0545  WBC 13.2*  HGB 14.5  HCT 43.0  PLT 212   BMET  Recent Labs  05/07/13 0545  NA 140  K 3.8    CL 103  CO2 24  GLUCOSE 170*  BUN 20  CREATININE 1.22  CALCIUM 9.6    Assessment/Plan   Principal Problem:   Abnormal nuclear cardiac imaging test Active Problems:   DIABETES MELLITUS, TYPE II   HYPERLIPIDEMIA   HYPERTENSION   CAD- S/P CABG x 5 (LIMA-LAD, SVG-ramus intermedius, SVG-OM1, SVG to distal RCA, SVG-PDA) 01/21/13   Exertional angina  Plan:  S/P successful complex bifurcation PTCA/stenting of the Ramus Intermedius using a Promus Premier DES 2.5 mm 24 mm stent crossing the inferior branch requiring rescue PTCA of this branch. Residual circumflex and RCA disease to be managed medically.   Will increase imdur to 60 mg and lopressor to 50 bid.   LOS: 1 day    Dquan Cortopassi 05/07/2013 8:51 AM

## 2013-05-07 NOTE — Care Management Note (Addendum)
  Page 1 of 1   05/07/2013     2:13:31 PM   CARE MANAGEMENT NOTE 05/07/2013  Patient:  Charles [name], Charles [name]   Account Number:  [id number]  Date Initiated:  05/07/2013  Documentation initiated by:  [facility]  Subjective/Objective Assessment:   78 yo male admitted with chest pain//home with spouse     Action/Plan:   cardiac cath//return home with spouse; benefits check for Brilinta 90mg  BID   Anticipated DC Date:  05/07/2013   Anticipated DC Plan:  HOME/SELF CARE      DC Planning Services  CM consult      Choice offered to / List presented to:             Status of service:   Medicare Important Message given?   (If response is "NO", the following Medicare IM given date fields will be blank) Date Medicare IM given:   Date Additional Medicare IM given:    Discharge Disposition:    Per UR Regulation:    If discussed at Long Length of Stay Meetings, dates discussed:    Comments:  05/07/13 1045 Oletta Cohn, RN, BSN, [employer] [phone] Spoke with pt at bedside regarding benefits check for Brilinta 90mg  BID.  Pt has brochure with 30 day free card and refill assistance card intact.  Pt utilizes [facility] on [address] for prescription needs.  NCM called pharmacy to confirm availability of medication. Information relayed to pt.  Pt verbalizes importance of filling medication upon discharge.

## 2013-05-07 NOTE — Progress Notes (Signed)
CARDIAC REHAB PHASE I   PRE:  Rate/Rhythm: 99 SR    BP: sitting 172/68    SaO2:   MODE:  Ambulation: 650 ft   POST:  Rate/Rhythm: 120 ST    BP: sitting 228/101, 197/69 5 min later     SaO2:   Pt did not c/o angina however BP and HR elevated, see above.  Reviewed ed. To ask MD when he can return to [facility].  [phone]  Elissa Lovett Chalco CES, ACSM 05/07/2013 9:42 AM

## 2013-05-07 NOTE — Progress Notes (Signed)
Pt. Seen and examined. Agree with the NP/PA-C note as written. Groin site without hematoma or bruit, strong distal pulse. No angina. Walking around without problems. Ok for discharge home today. Follow-up with Dr. Salena Saner in 7-10 days.  Chrystie Nose, MD, [facility] Attending Cardiologist [facility]

## 2013-05-07 NOTE — Discharge Summary (Signed)
Physician Discharge Summary  Patient ID: Charles [name] MRN: [id number] DOB/AGE: 76/10/1934 76 y.o.  Admit date: 05/06/2013 Discharge date: 05/07/2013  Admission Diagnoses:  Abnormal nuclear cardiac imaging test  Discharge Diagnoses:  Principal Problem:   Abnormal nuclear cardiac imaging test Active Problems:   DIABETES MELLITUS, TYPE II   HYPERLIPIDEMIA   HYPERTENSION   CAD- S/P CABG x 5 (LIMA-LAD, SVG-ramus intermedius, SVG-OM1, SVG to distal RCA, SVG-PDA) 01/21/13   Exertional angina   Discharged Condition: stable  Hospital Course:  78 yo male 3 months s/p CABG x 5 (LIMA-LAD, SVG-ramus intermedius, SVG-OM1, SVG to distal RCA, SVG-PDA) 01/21/13., He continued to have exertional chest pain when he walked up a slight hill. This happened predictably with exercise and resolved after 3-4 minutes of rest. He was unable to resume exercise and again angina will occur. He did not take nitroglycerin. At cardiac rehabilitation he avoided excessive incline on the treadmill and has not had angina. It does happen whenever he tries to "push it".   After starting isosorbide mononitrate he developed a headache which got a little better after one week, without noticeable improvement in the level of angina. The increased dose of beta blocker has been associated with some degree of fatigue.  Pre operatively he had 50% stenosis in the left main coronary artery and a high-grade ulcerated mid right coronary artery stenosis as well as stenosis in the proximal posterior descending artery and first diagonal artery and the major oblique marginal artery. All territories with significant stenosis appear to have been appropriately revascularized.  The patient presented for coronary angiography with Dr. Royann Shivers which revealed occlusion of the sequential SVG to ramus and OM and of the distal limb of the sequential SVG to RCA and PDA.  He subsequently underwent PCI with a DES to the ramus which was completed by Dr.  Herbie Baltimore.  Brilinta was started and ASA decreased to 81mg . Lopressor was increased to 50mg  bid and Imdur to 60mg  daily.  The patient was seen by Dr. Rennis Golden who felt he was stable for DC home.  He will follow up with Dr. Royann Shivers in 7-10 days.    Consults: None  Significant Diagnostic Studies: Angiographic Findings:  1. The left main coronary artery exhibited significant atherosclerosis and calcification, including a 50% stenosis in its midportion. It bifurcates in the usual fashion into the left anterior descending artery and left circumflex coronary artery.  2. The left anterior descending artery is a medium size vessel that barely reaches the apex and generates 3 major diagonal branches. There is mostly LIMA flow to the LAD.  There is evidence of mild luminal irregularities and mild calcification in the LAD artery. The maximum diameter stenosis is probably 40%, no hemodynamically meaningful stenoses are seen.  The first diagonal branch is very proximal, very large and operates as a ramus intermedius type vessel. The large first diagonal artery has a 80-90% proximal stenosis, it looks worse than on the pre CABG angiogram.  3. The left circumflex coronary artery is a relatively small-size vessel non- dominant vessel that generates only one major oblique marginal artery. There is evidence of moderate luminal irregularities and moderate calcification. There are 2 sequential hemodynamically meaningful stenoses in the proximal third of the oblique marginal artery, which is diffusely diseased. The maximum diameter stenosis is approximately 70%.  4. The right coronary artery is a very large-size dominant vessel that generates a branching posterior lateral ventricular system as well as a very long posterior descending artery that supplies the  left ventricular apex. There is evidence of extensive luminal irregularities and moderate calcification. Multiple hemodynamically meaningful stenoses are seen. There is a  85-90% stenosis in the mid third of the right coronary artery. There is a 85-90% stenosis in the proximal posterior descending artery, that appears irregular and may be ulcerated. There is a 60-70% stenosis in the mid posterior descending artery. The posterior lateral ventricular branch is relatively free of disease.  5. The LIMA bypass connects to a relatively small LAD artery. It is widely patent with excellent flow.  6. The SVG to the inferior wall appears to have only one anastomosis, to the distal RCA. There is no evidence of a sequential limb to the PDA and this must be occluded at the RCA touchdown.  7. The SVG to the lateral wall (sequential OM - ramus) is occluded at the origin  8. The left ventricle is not injected. There is no aortic valve stenosis by pullback. The left ventricular end-diastolic pressure is 26 mm Hg.   IMPRESSIONS:  Since CABG, there appears to be interval occlusion of the sequential SVG to ramus and OM and of the distal limb of the sequential SVG to RCA and PDA  RECOMMENDATION:  PCI-stent of the ramus intermedius.     Thurmon Fair, MD, [facility]   Percutaneous Coronary Intervention: Sheath exchanged for 6 Fr  Guide: 6 Fr XB 3.5 Guidewire: Pro-water, followed by Luge  Predilation Balloon: Sprinter Legend 2.0 mm x 12 mm;  8 Atm x 15 Sec, 8 Atm x 30 to Sec,  Following initial balloon inflation, with removing the balloon over the wire, the patient abruptly move the right leg leading to loss of guide and wire positioning. Initial attempts to read wire the vessel with the pro-wire wire was unsuccessful. At noon luge wire was then used to successfully pass the lesion down into the superior branch of the ramus intermedius.  A short dissection was noted crossing into the distal bifurcation. At this time the decision made to use a longer than initially planned stent with potential for need to perform rescue PTCA of the inferior branch.  Stent: Promus Premier DES 2.25 mm x 24 mm;  crossing the ramus bifurcation  11 Atm x 30 Sec,-- nominal inflation Post-dilation Balloon: Valmeyer Euphora 2.5 mm x 15 mm; In the proximal two thirds of the stent  9 Atm x 40 Sec distal (2.45 mm) , 16 Atm x 30 Sec (2.55 mm) At this point the superior branch was wired with a pro-water wire, and the luge wire was redirected into the inferior branch.  Emerge Monorail 2.0 mm x8 mm - 2 inflations 6 Atm x 30 Sec, and 1 crossing into the stent at the ostium of the vessel 8 Atm, x 45 Sec  This led to reduction of a post stenting inferior branch ostial stenosis of 90% to less than 20% Post deployment angiography in multiple views, with and without guidewire in place revealed excellent stent deployment and lesion coverage. There was no evidence of dissection or perforation. the ostial inferior branch lesion was reduced to less than 20%  PATIENT DISPOSITION:  The patient was transferred to the PACU holding area in a hemodynamicaly stable, chest pain free condition.  The patient tolerated the procedure well, and there were no complications. EBL: < 10 ml  The patient was stable before, during, and after the procedure. POST-OPERATIVE DIAGNOSIS:  Successful complex bifurcation PTCA/stenting of the Ramus Intermedius using a Promus Premier DES 2.5 mm 24 mm stent crossing  the inferior branch requiring rescue PTCA of this branch.  Residual circumflex and RCA disease to be managed medically.  PLAN OF CARE:  Continue Angiomax for 2 hours post PCI  Dual Antiplatelet Therapy with aspirin plus Brilinta for a minimum of 1 year  Discharge tomorrow to follow up with Dr. Royann Shivers to continue optimization of medical therapy of his existing disease.  Marykay Lex, M.D., M.S.  [facility]  [address]  [place], [place] [address]  [phone]  05/06/2013  Treatments: See above  Discharge Exam: Blood pressure 172/73, pulse 96, temperature 97.4 F (36.3 C), temperature source  Oral, resp. rate 18, height 6' (1.829 m), weight 179 lb 0.2 oz (81.2 kg), SpO2 97.00%.   Disposition: 01-Home or Self Care      Discharge Orders   Future Appointments Provider Department Dept Phone   05/08/2013 8:15 AM Mc-Phase2 Monitor [facility] CARDIAC [facility] [phone]   05/10/2013 8:15 AM Mc-Phase2 Monitor 18 [facility] CARDIAC [facility] [phone]   05/13/2013 8:15 AM Mc-Phase2 Monitor 18 [facility] CARDIAC [facility] [phone]   05/15/2013 8:15 AM Mc-Phase2 Monitor 18 [facility] CARDIAC [facility] [phone]   05/17/2013 8:15 AM Mc-Phase2 Monitor 18 [facility] CARDIAC [facility] [phone]   05/20/2013 8:15 AM Mc-Phase2 Monitor 18 [facility] CARDIAC [facility] [phone]   05/22/2013 8:15 AM Mc-Phase2 Monitor 18 [facility] CARDIAC [facility] [phone]   05/24/2013 8:15 AM Mc-Phase2 Monitor 18 [facility] CARDIAC [facility] [phone]   05/27/2013 8:15 AM Mc-Phase2 Monitor 18 [facility] CARDIAC [facility] [phone]   05/29/2013 8:15 AM Mc-Phase2 Monitor 18 [facility] CARDIAC [facility] [phone]   05/31/2013 8:15 AM Mc-Phase2 Monitor 18 [facility] CARDIAC [facility] [phone]   06/03/2013 8:15 AM Mc-Phase2 Monitor 18 [facility] CARDIAC [facility] [phone]   06/05/2013 8:15 AM Mc-Phase2 Monitor 18 [facility] CARDIAC [facility] [phone]   06/07/2013 8:15 AM Mc-Phase2 Monitor 18 [facility] CARDIAC [facility] [phone]   Future Orders Complete By Expires   Diet - low sodium heart healthy  As directed    Discharge instructions  As directed    Comments:     No lifting more than a half gallon of milk or driving for three days.   Increase activity slowly  As directed        Medication List         aspirin 81 MG EC tablet  Take 1 tablet (81 mg total) by mouth  daily.     CoQ10 100 MG Caps  Take 100 mg by mouth daily.     finasteride 5 MG tablet  Commonly known as:  PROSCAR  Take 5 mg by mouth daily.     isosorbide mononitrate 60 MG 24 hr tablet  Commonly known as:  IMDUR  Take 1 tablet (60 mg total) by mouth daily.     Krill Oil 1000 MG Caps  Take 1,000 mg by mouth daily.     losartan 50 MG tablet  Commonly known as:  COZAAR  Take 50 mg by mouth daily.     metoprolol 50 MG tablet  Commonly known as:  LOPRESSOR  Take 1 tablet (50 mg total) by mouth 2 (two) times daily.     OVER THE COUNTER MEDICATION  Take 1 tablet by mouth daily. Macular degeneration vitamin.     pravastatin 40 MG  tablet  Commonly known as:  PRAVACHOL  Take 1 tablet (40 mg total) by mouth every evening.     Ticagrelor 90 MG Tabs tablet  Commonly known as:  BRILINTA  Take 1 tablet (90 mg total) by mouth 2 (two) times daily.     Vitamin D3 2000 UNITS Tabs  Take 2,000 Units by mouth daily.       Follow-up Information   Follow up with Thurmon Fair, MD. (Our office scheduler will call you with the appt date and time.)    Specialty:  Cardiology   Contact information:   [address] [place] [address] [phone]       Signed: Wilburt Finlay 05/07/2013, 12:32 PM

## 2013-05-08 ENCOUNTER — Encounter (HOSPITAL_COMMUNITY): Admission: RE | Admit: 2013-05-08 | Payer: Medicare Other | Source: Ambulatory Visit

## 2013-05-10 ENCOUNTER — Encounter (HOSPITAL_COMMUNITY): Payer: Medicare Other

## 2013-05-10 ENCOUNTER — Telehealth: Payer: Self-pay | Admitting: Cardiovascular Disease

## 2013-05-10 MED ORDER — NITROGLYCERIN 0.4 MG SL SUBL: 0.4000 mg | SUBLINGUAL_TABLET | SUBLINGUAL | Status: DC | PRN

## 2013-05-10 NOTE — Telephone Encounter (Signed)
Needs a prescription written for Nitro glycerin  Sent to his pharmacy ( [facility] on [address] ). Also he is having problems with catching his breath and he thinks its a anxiety problem , but not sure . Had some Dizapam  3mg  and at night he will take some and will sleep better. Want to know can he have something to get this under control. Please call     Thanks

## 2013-05-10 NOTE — Telephone Encounter (Signed)
Notified patient that medication has been escribed. Informed him to contact his PCP regarding inability to sleep.

## 2013-05-13 ENCOUNTER — Encounter (HOSPITAL_COMMUNITY): Admission: RE | Admit: 2013-05-13 | Payer: Medicare Other | Source: Ambulatory Visit

## 2013-05-14 ENCOUNTER — Ambulatory Visit (INDEPENDENT_AMBULATORY_CARE_PROVIDER_SITE_OTHER): Payer: Medicare Other | Admitting: Internal Medicine

## 2013-05-14 ENCOUNTER — Encounter: Payer: Self-pay | Admitting: Internal Medicine

## 2013-05-14 VITALS — BP 136/77 | HR 94 | Temp 98.4°F | Wt 181.8 lb

## 2013-05-14 DIAGNOSIS — I25810 Atherosclerosis of coronary artery bypass graft(s) without angina pectoris: Secondary | ICD-10-CM

## 2013-05-14 DIAGNOSIS — F411 Generalized anxiety disorder: Secondary | ICD-10-CM

## 2013-05-14 DIAGNOSIS — F329 Major depressive disorder, single episode, unspecified: Secondary | ICD-10-CM | POA: Insufficient documentation

## 2013-05-14 MED ORDER — DIAZEPAM 2 MG PO TABS: 2.0000 mg | ORAL_TABLET | Freq: Every evening | ORAL | Status: DC | PRN

## 2013-05-14 NOTE — Assessment & Plan Note (Signed)
Status post a stent last week, he has ecchymosis at the right groin but no evidence of bleeding or complications on clinical grounds.

## 2013-05-14 NOTE — Assessment & Plan Note (Addendum)
anxiety and  shortness of breath at rest since CABG ~ 3 months ago. Episodes are getting less frequent, he had exertional angina after the CABG but that has resolved since the stent last week. The patient is pretty certain the primary problem is anxiety and given description of symptoms it seems a reasonable explanation. With discussed different modalities of treatment including counseling, SSRIs continue diazepam. At this point he prefers to continue with benzos. Plan: counseled Refill meds Come back in 2 months Patient knows to call me if he has any different type of SOB, chest pain or if develops depression.  Today , I spent more than 25 min with the patient, >50% of the time counseling

## 2013-05-14 NOTE — Progress Notes (Signed)
Subjective:    Patient ID: Charles [name], male    DOB: 02-26-1935, 78 y.o.   MRN: [id number]  HPI Here to discuss the following Since he had the CABG he is experiencing shortness of breath and anxiety. The symptoms happen only at night and at rest, typically he gets short of breath, he starts to think about it and gets worse. If he try to relax SOB usually goes away but if it  Continue w/ sx  he takes diazepam (has a left over)  and 40 minutes later he feels better. He was having chest pain after the CABG, last week got a stent and since then the chest pain is essentially gone, he continue with SOB at night and at rest. Few months ago the shortness of breath was almost every night and now is only 3 times a week.  Also concerned about a bruise on the right groin.   Past Medical History  Diagnosis Date  . Hypertension   . Hyperlipidemia     diet controlled, statins and zetia intolerant  . Retinopathy     mild glaucomea  . Transient ischemic attack 1999  . Histoplasmosis   . Chronic cough 08/2009    -allergy profile- june 2,2011- neg. -Max GERD rx/ off oils January 07 2010 x 3 weeks only but no benefit. - Sinus CT rec July 11,2011- refused due to claustrophobia -MCT neg for asthma 03/01/10 -Add 1st Gen H1 July 11,2011- better  . Macular degeneration   . PONV (postoperative nausea and vomiting)     extreme nausea  . Coronary artery disease     CABG 01-2013, stent 04-2013    . Diabetes mellitus     no meds   Past Surgical History  Procedure Laterality Date  . Cervical spine surgery  2002    plate  . Cholecystectomy  1985  . Coronary artery bypass graft N/A 01/21/2013    Procedure: Coronary Arery Bypass Grafting  Times Five Using Left Internal Mammary Artery and Right Saphenous Leg Vein Harvested Endoscopically;  Surgeon: Loreli Slot, MD;  Location: [facility] OR;  Service: Open Heart Surgery;  Laterality: N/A;   History   Social History  . Marital Status: Married    Spouse Name: N/A     Number of Children: 2  . Years of Education: N/A   Occupational History  . reitred    Social History Main Topics  . Smoking status: Former Smoker -- 1.00 packs/day for 10 years    Types: Cigarettes    Quit date: 01/17/1977  . Smokeless tobacco: Never Used     Comment: Stopped at age 26   . Alcohol Use: No     Comment: seldom  . Drug Use: No  . Sexual Activity: Not on file   Other Topics Concern  . Not on file   Social History Narrative  . No narrative on file    Review of Systems Denies depression at all. No suicidal ideas No cough. No past pain or swelling No orthopnea or paroxysmal nocturnal dyspnea. As far as the bruise in the groin: denies back pain.     Objective:   Physical Exam BP 136/77  Pulse 94  Temp(Src) 98.4 F (36.9 C)  Wt 181 lb 12.8 oz (82.464 kg)  BMI 24.65 kg/m2  SpO2 98%  General -- alert, well-developed, NAD.   Lungs -- normal respiratory effort, no intercostal retractions, no accessory muscle use, and normal breath sounds.  Heart-- normal rate, regular rhythm, no  murmur.  Extremities-- no pretibial edema bilaterally , calves Symmetric and not tender Right groin : + superficial echymosis,  he has a good femoral pulse, no bruit or tenderness. Neurologic--  alert & oriented X3. Speech normal, gait normal, strength normal in all extremities.  Psych-- Cognition and judgment appear intact. Cooperative with normal attention span and concentration. No anxious appearing , no depressed appearing.

## 2013-05-15 ENCOUNTER — Encounter (HOSPITAL_COMMUNITY)
Admission: RE | Admit: 2013-05-15 | Discharge: 2013-05-15 | Disposition: A | Discharge status: Home / Self Care | Payer: Medicare Other | Source: Ambulatory Visit | Attending: Cardiovascular Disease | Admitting: Cardiovascular Disease

## 2013-05-15 DIAGNOSIS — Z5189 Encounter for other specified aftercare: Secondary | ICD-10-CM | POA: Insufficient documentation

## 2013-05-15 DIAGNOSIS — Z951 Presence of aortocoronary bypass graft: Secondary | ICD-10-CM | POA: Insufficient documentation

## 2013-05-15 DIAGNOSIS — I2510 Atherosclerotic heart disease of native coronary artery without angina pectoris: Secondary | ICD-10-CM | POA: Insufficient documentation

## 2013-05-15 DIAGNOSIS — Z8673 Personal history of transient ischemic attack (TIA), and cerebral infarction without residual deficits: Secondary | ICD-10-CM | POA: Insufficient documentation

## 2013-05-15 LAB — GLUCOSE, CAPILLARY: Glucose-Capillary: 113 mg/dL — ABNORMAL HIGH (ref 70–99)

## 2013-05-15 NOTE — Progress Notes (Signed)
Pt returned to exercise with ok by MD s/p stent placement on 05/06/13.  Pt tolerated moderate exercise with a decrease in the incline level on the treadmill due to right groin tenderness with no other mentioned difficulty.  Pt remarks that this was the first day at rehab he did not experience any tightness of shortness of breath during exercise.  Pt feels positive about how good things turned out and express feelings of relief.  Will continue to monitor.

## 2013-05-17 ENCOUNTER — Encounter (HOSPITAL_COMMUNITY)
Admission: RE | Admit: 2013-05-17 | Discharge: 2013-05-17 | Disposition: A | Discharge status: Home / Self Care | Payer: Medicare Other | Source: Ambulatory Visit | Attending: Cardiovascular Disease | Admitting: Cardiovascular Disease

## 2013-05-20 ENCOUNTER — Encounter: Payer: Self-pay | Admitting: Cardiovascular Disease

## 2013-05-20 ENCOUNTER — Ambulatory Visit (INDEPENDENT_AMBULATORY_CARE_PROVIDER_SITE_OTHER): Payer: Medicare Other | Admitting: Cardiovascular Disease

## 2013-05-20 ENCOUNTER — Encounter (HOSPITAL_COMMUNITY)
Admission: RE | Admit: 2013-05-20 | Discharge: 2013-05-20 | Disposition: A | Discharge status: Home / Self Care | Payer: Medicare Other | Source: Ambulatory Visit | Attending: Cardiovascular Disease | Admitting: Cardiovascular Disease

## 2013-05-20 VITALS — BP 122/68 | HR 57 | Resp 16 | Ht 71.75 in | Wt 180.4 lb

## 2013-05-20 DIAGNOSIS — I2510 Atherosclerotic heart disease of native coronary artery without angina pectoris: Secondary | ICD-10-CM

## 2013-05-20 DIAGNOSIS — I25810 Atherosclerosis of coronary artery bypass graft(s) without angina pectoris: Secondary | ICD-10-CM

## 2013-05-20 DIAGNOSIS — Z79899 Other long term (current) drug therapy: Secondary | ICD-10-CM

## 2013-05-20 DIAGNOSIS — E119 Type 2 diabetes mellitus without complications: Secondary | ICD-10-CM

## 2013-05-20 DIAGNOSIS — E785 Hyperlipidemia, unspecified: Secondary | ICD-10-CM

## 2013-05-20 NOTE — Patient Instructions (Addendum)
Your physician has recommended you make the following change in your medication:  - Cut the isosorbide mononitrate and a half(30 mg a day) for 5-7 days; if your chest pain symptoms with activity do not return, then you can stop this medication altogether. Your physician recommends that you return for lab work when fasting.  Your physician recommends that you schedule a follow-up appointment in: 6 months

## 2013-05-21 ENCOUNTER — Encounter: Payer: Self-pay | Admitting: Cardiovascular Disease

## 2013-05-21 NOTE — Progress Notes (Signed)
Patient ID: Charles [name], male   DOB: [birth year], 78 y.o.   MRN: [id number]      Reason for office visit CAD, F/U after PCI-stent   Charles [name] had persistent exertional angina pectoris following his 5 vessel bypass procedure and underwent repeat coronary angiography on September 29. It showed that there was occlusion of the new sequential saphenous vein graft bypass to the ramus intermedius and OM branch of the left circumflex coronary artery, as well as occlusion of the distal limb of the sequential bypass to the distal RCA and posterior descending artery. The most important ischemic territory appeared to be that of the ramus intermedius artery. He underwent placement of a drug-eluting stent (Promus Premier 2.25 x 24 mm). He has not had complete resolution of his exertional symptoms. He is able to climb the hill that would consistently meet his threshold for angina pectoris without stopping. He feels great.  We discussed critical need for an interrupted dual antiplatelet therapy to avoid the risk of stent thrombosis. Also reviewed the possibility for in-stent restenosis since the treated vessel was relatively small in diameter. Also reviewed the need for aggressive treatment of risk factors, especially diabetes and hyperlipidemia 2 event progression of native disease.   Allergies  Allergen Reactions  . Atorvastatin     REACTION: myalgia  . Ezetimibe     REACTION: dizzy  . Lovastatin     REACTION: weakness  . Percocet [Oxycodone-Acetaminophen] Other (See Comments)    hallucinations    Current Outpatient Prescriptions  Medication Sig Dispense Refill  . aspirin 81 MG EC tablet Take 1 tablet (81 mg total) by mouth daily.      . Cholecalciferol (VITAMIN D3) 2000 UNITS TABS Take 2,000 Units by mouth daily.      . Coenzyme Q10 (COQ10) 100 MG CAPS Take 100 mg by mouth daily.      . diazepam (VALIUM) 2 MG tablet Take 1 tablet (2 mg total) by mouth at bedtime as needed for anxiety.  30 tablet  1    . finasteride (PROSCAR) 5 MG tablet Take 5 mg by mouth daily.      . isosorbide mononitrate (IMDUR) 60 MG 24 hr tablet Take 1 tablet (60 mg total) by mouth daily.  30 tablet  5  . KRILL OIL 1000 MG CAPS Take 1,000 mg by mouth daily.       Marland Kitchen losartan (COZAAR) 50 MG tablet Take 50 mg by mouth daily.      . metoprolol (LOPRESSOR) 50 MG tablet Take 1 tablet (50 mg total) by mouth 2 (two) times daily.  60 tablet  5  . nitroGLYCERIN (NITROSTAT) 0.4 MG SL tablet Place 1 tablet (0.4 mg total) under the tongue every 5 (five) minutes as needed for chest pain.  25 tablet  6  . OVER THE COUNTER MEDICATION Take 1 tablet by mouth daily. Macular degeneration vitamin.      . pravastatin (PRAVACHOL) 40 MG tablet Take 1 tablet (40 mg total) by mouth every evening.  30 tablet  6  . Ticagrelor (BRILINTA) 90 MG TABS tablet Take 1 tablet (90 mg total) by mouth 2 (two) times daily.  60 tablet  10   No current facility-administered medications for this visit.    Past Medical History  Diagnosis Date  . Hypertension   . Hyperlipidemia     diet controlled, statins and zetia intolerant  . Retinopathy     mild glaucomea  . Transient ischemic attack 1999  . Histoplasmosis   .  Chronic cough 08/2009    -allergy profile- june 2,2011- neg. -Max GERD rx/ off oils January 07 2010 x 3 weeks only but no benefit. - Sinus CT rec July 11,2011- refused due to claustrophobia -MCT neg for asthma 03/01/10 -Add 1st Gen H1 July 11,2011- better  . Macular degeneration   . PONV (postoperative nausea and vomiting)     extreme nausea  . Coronary artery disease     CABG 01-2013, stent 04-2013    . Diabetes mellitus     no meds    Past Surgical History  Procedure Laterality Date  . Cervical spine surgery  2002    plate  . Cholecystectomy  1985  . Coronary artery bypass graft N/A 01/21/2013    Procedure: Coronary Arery Bypass Grafting  Times Five Using Left Internal Mammary Artery and Right Saphenous Leg Vein Harvested Endoscopically;   Surgeon: Loreli Slot, MD;  Location: [facility] OR;  Service: Open Heart Surgery;  Laterality: N/A;    Family History  Problem Relation Age of Onset  . Coronary artery disease Neg Hx   . Stroke Neg Hx   . Colon cancer Neg Hx   . Prostate cancer Neg Hx   . Atopy Neg Hx   . Asthma Neg Hx   . Hypertension Mother   . Hypertension Father   . Diabetes Father   . Cancer Father     colon  . Hypertension Brother   . Cancer Brother     lung  . Hypertension Sister   . Cancer Sister     lung  . Diabetes Brother   . Diabetes Sister   . Diabetes      Grandparents   . Lung cancer Brother   . Lung cancer Sister     History   Social History  . Marital Status: Married    Spouse Name: N/A    Number of Children: 2  . Years of Education: N/A   Occupational History  . reitred    Social History Main Topics  . Smoking status: Former Smoker -- 1.00 packs/day for 10 years    Types: Cigarettes    Quit date: 01/17/1977  . Smokeless tobacco: Never Used     Comment: Stopped at age 53   . Alcohol Use: No     Comment: seldom  . Drug Use: No  . Sexual Activity: Not on file   Other Topics Concern  . Not on file   Social History Narrative  . No narrative on file    Review of systems: The patient specifically denies any chest pain at rest or with exertion, dyspnea at rest or with exertion, orthopnea, paroxysmal nocturnal dyspnea, syncope, palpitations, focal neurological deficits, intermittent claudication, lower extremity edema, unexplained weight gain, cough, hemoptysis or wheezing.  The patient also denies abdominal pain, nausea, vomiting, dysphagia, diarrhea, constipation, polyuria, polydipsia, dysuria, hematuria, frequency, urgency, abnormal bleeding or bruising, fever, chills, unexpected weight changes, mood swings, change in skin or hair texture, change in voice quality, auditory or visual problems, allergic reactions or rashes, new musculoskeletal complaints other than usual "aches  and pains".   PHYSICAL EXAM BP 122/68  Pulse 57  Resp 16  Ht 5' 11.75" (1.822 m)  Wt 180 lb 6.4 oz (81.829 kg)  BMI 24.65 kg/m2  General: Alert, oriented x3, no distress Head: no evidence of trauma, PERRL, EOMI, no exophtalmos or lid lag, no myxedema, no xanthelasma; normal ears, nose and oropharynx Neck: normal jugular venous pulsations and no hepatojugular reflux;  brisk carotid pulses without delay and no carotid bruits Chest: clear to auscultation, no signs of consolidation by percussion or palpation, normal fremitus, symmetrical and full respiratory excursions, healed sternotomy scar Cardiovascular: normal position and quality of the apical impulse, regular rhythm, normal first and widely split second heart sounds, no murmurs, rubs or gallops Abdomen: no tenderness or distention, no masses by palpation, no abnormal pulsatility or arterial bruits, normal bowel sounds, no hepatosplenomegaly Extremities: no clubbing, cyanosis or edema; 2+ radial, ulnar and brachial pulses bilaterally; 2+ right femoral, posterior tibial and dorsalis pedis pulses; 2+ left femoral, posterior tibial and dorsalis pedis pulses; no subclavian or femoral bruits Neurological: grossly nonfocal   EKG: Sinus rhythm, right bundle branch block  Lipid Panel     Component Value Date/Time   CHOL 163 02/26/2013 0806   TRIG 105.0 02/26/2013 0806   HDL 45.10 02/26/2013 0806   CHOLHDL 4 02/26/2013 0806   VLDL 21.0 02/26/2013 0806   LDLCALC 97 02/26/2013 0806    BMET    Component Value Date/Time   NA 140 05/07/2013 0545   K 3.8 05/07/2013 0545   CL 103 05/07/2013 0545   CO2 24 05/07/2013 0545   GLUCOSE 170* 05/07/2013 0545   BUN 20 05/07/2013 0545   CREATININE 1.22 05/07/2013 0545   CREATININE 1.37* 05/01/2013 0951   CALCIUM 9.6 05/07/2013 0545   GFRNONAA 56* 05/07/2013 0545   GFRAA 65* 05/07/2013 0545     ASSESSMENT AND PLAN CAD- S/P CABG x 5 (LIMA-LAD, SVG-ramus intermedius, SVG-OM1, SVG to distal RCA, SVG-PDA)  01/21/13 CATH 05/06/2013: occlusion of the sequential SVG to ramus and OM and of the distal limb of the sequential SVG to RCA and PDA. PCI with a DES to the ramus by Dr. Herbie Baltimore (PROMUS PREMIER DES 2.25 mm 24 mm, with rescue PTCA of an inferior branch).   We discussed critical need for an interrupted dual antiplatelet therapy to avoid the risk of stent thrombosis. Also reviewed the possibility for in-stent restenosis since the treated vessel was relatively small in diameter. Also reviewed the need for aggressive treatment of risk factors, especially diabetes and hyperlipidemia 2 event progression of native disease.  Reevaluate lipid profile and hemoglobin A1c, it has been roughly 3 months since initiation of statin therapy. Target LDL cholesterol less than 70 mg/dL. Recheck renal function following contrast exposure.   Orders Placed This Encounter  Procedures  . Comp Met (CMET)  . HgB A1c  . Lipid Profile  . EKG 12-Lead   Greely Atiyeh  Thurmon Fair, MD, [facility] [phone] office [phone] pager

## 2013-05-21 NOTE — Assessment & Plan Note (Addendum)
CATH 05/06/2013: occlusion of the sequential SVG to ramus and OM and of the distal limb of the sequential SVG to RCA and PDA. PCI with a DES to the ramus by Dr. Herbie Baltimore (PROMUS PREMIER DES 2.25 mm 24 mm, with rescue PTCA of an inferior branch).   We discussed critical need for an interrupted dual antiplatelet therapy to avoid the risk of stent thrombosis. Also reviewed the possibility for in-stent restenosis since the treated vessel was relatively small in diameter. Also reviewed the need for aggressive treatment of risk factors, especially diabetes and hyperlipidemia 2 event progression of native disease.  Reevaluate lipid profile and hemoglobin A1c, it has been roughly 3 months since initiation of statin therapy. Target LDL cholesterol less than 70 mg/dL. Recheck renal function following contrast exposure.

## 2013-05-22 ENCOUNTER — Encounter (HOSPITAL_COMMUNITY)
Admission: RE | Admit: 2013-05-22 | Discharge: 2013-05-22 | Disposition: A | Discharge status: Home / Self Care | Payer: Medicare Other | Source: Ambulatory Visit | Attending: Cardiovascular Disease | Admitting: Cardiovascular Disease

## 2013-05-23 LAB — COMPREHENSIVE METABOLIC PANEL
ALT: 13 U/L (ref 0–53)
Alkaline Phosphatase: 61 U/L (ref 39–117)
CO2: 29 mEq/L (ref 19–32)
Calcium: 9.6 mg/dL (ref 8.4–10.5)
Creat: 1.44 mg/dL — ABNORMAL HIGH (ref 0.50–1.35)
Sodium: 141 mEq/L (ref 135–145)
Total Bilirubin: 0.7 mg/dL (ref 0.3–1.2)

## 2013-05-23 LAB — LIPID PANEL
Cholesterol: 162 mg/dL (ref 0–200)
HDL: 49 mg/dL (ref >39)
LDL Cholesterol: 88 mg/dL (ref 0–99)
Triglycerides: 125 mg/dL (ref <150)

## 2013-05-23 LAB — HEMOGLOBIN A1C: Hgb A1c MFr Bld: 6.5 % — ABNORMAL HIGH (ref <5.7)

## 2013-05-24 ENCOUNTER — Encounter (HOSPITAL_COMMUNITY)
Admission: RE | Admit: 2013-05-24 | Discharge: 2013-05-24 | Disposition: A | Discharge status: Home / Self Care | Payer: Medicare Other | Source: Ambulatory Visit | Attending: Cardiovascular Disease | Admitting: Cardiovascular Disease

## 2013-05-27 ENCOUNTER — Encounter (HOSPITAL_COMMUNITY)
Admission: RE | Admit: 2013-05-27 | Discharge: 2013-05-27 | Disposition: A | Discharge status: Home / Self Care | Payer: Medicare Other | Source: Ambulatory Visit | Attending: Cardiovascular Disease | Admitting: Cardiovascular Disease

## 2013-05-27 ENCOUNTER — Telehealth: Payer: Self-pay | Admitting: *Deleted

## 2013-05-27 NOTE — Telephone Encounter (Signed)
Message copied by Vita Barley on Mon May 27, 2013  1:18 PM ------      Message from: Thurmon Fair      Created: Fri May 24, 2013 12:45 PM       Good glycemic control (a1c 6.5%) and acceptable cholesterol. LDL is 88. Target is <100, but would be preferable <70 to reduce the chance of further blockages. Would like to add zetia 10 mg daily - I don't think it will increase the likelihood of side effects. Recheck in 3 months. ------

## 2013-05-27 NOTE — Telephone Encounter (Signed)
Message copied by Vita Barley on Mon May 27, 2013  1:23 PM ------      Message from: [place], [place]      Created: Fri May 24, 2013 12:45 PM       Good glycemic control (a1c 6.5%) and acceptable cholesterol. LDL is 88. Target is <100, but would be preferable <70 to reduce the chance of further blockages. Would like to add zetia 10 mg daily - I don't think it will increase the likelihood of side effects. Recheck in 3 months. ------

## 2013-05-27 NOTE — Telephone Encounter (Signed)
States he doesn't want to go up to 80mg  of Pravastatin because he already has some muscle aches and pains and weakness.  States he already skips an occas. day which seems to help with the symptoms.  Will try to do better on his diet and already exercises 5 days a week.

## 2013-05-27 NOTE — Telephone Encounter (Signed)
I see. Just ask him to increase pravastatin to 80 mg at dinner/bedtime and recheck 3 months

## 2013-05-27 NOTE — Telephone Encounter (Signed)
Lab results called to patient.  Dr. Salena Saner wants him to try Zetia but this is listed in his allergies.  Will review w/Dr. C and advise.

## 2013-05-27 NOTE — Progress Notes (Signed)
Pt c/o mild chest burning while exercising on nustep, ot able to work through the symptoms and continue exercise without decreasing workload.  Pt did not have symptoms on the treadmill or bicycle.   Pt reports he occasionally has similar episodes however not as severe as prior to stent placement and not present with all activities. Pt reports he is discouraged because he was not able to rake leaves as vigorously this year due to fatigue.  Pt also concerned about occasional shortness of breath.  Pt encouraged to monitor the frequency, strength and duration of his symptoms.  Report persistent or worsening symptoms to Dr Royann Shivers.  Understanding verbalized

## 2013-05-27 NOTE — Progress Notes (Signed)
zetia listed as an allergy.  Will send to Dr. Salena Saner for advise.

## 2013-05-29 ENCOUNTER — Encounter (HOSPITAL_COMMUNITY)
Admission: RE | Admit: 2013-05-29 | Discharge: 2013-05-29 | Disposition: A | Discharge status: Home / Self Care | Payer: Medicare Other | Source: Ambulatory Visit | Attending: Cardiovascular Disease | Admitting: Cardiovascular Disease

## 2013-05-31 ENCOUNTER — Encounter (HOSPITAL_COMMUNITY)
Admission: RE | Admit: 2013-05-31 | Discharge: 2013-05-31 | Disposition: A | Discharge status: Home / Self Care | Payer: Medicare Other | Source: Ambulatory Visit | Attending: Cardiovascular Disease | Admitting: Cardiovascular Disease

## 2013-05-31 ENCOUNTER — Telehealth: Payer: Self-pay | Admitting: *Deleted

## 2013-05-31 NOTE — Telephone Encounter (Signed)
Returned call and informed pt per instructions by MD.  Pt verbalized understanding and agreed w/ plan.  Appt scheduled for 10.31.14 at 4pm w/ Dr. Royann Shivers.

## 2013-05-31 NOTE — Telephone Encounter (Signed)
Message copied by Chauncey Reading on Fri May 31, 2013  2:02 PM ------      Message from: Thurmon Fair      Created: Fri May 31, 2013 11:43 AM      Regarding: FW: Cardiac Rehab       Please call him to restart his imdur 30 mg daily and schedule office follow up      [facility]      ----- Message -----         From: Robyne Peers, RN         Sent: 05/31/2013   9:26 AM           To: Thurmon Fair, MD      Subject: Cardiac Rehab                                            Dear Dr. Royann Shivers,            Unfortunately, pt continues to c/o chest pain with exertion after 6-7 minutes of activity.  Usually occurs on treadmill and nustep.  Pt rated pain 5/10 today, quickly resolved (1-46minutes) with rest.  Pt later able to participate in bike test without pain, however that activity only lasted 6 minutes.   Pt states he is able to do some activities at home without pain however other activities bring on the discomfort.  Pt describes as a burning.   Of note, pt has stopped his imdur as instructed at last office visit.   Please advise.              Thank you,      Deveron Furlong, RN      Cardiac Pulmonary Rehab                   ------

## 2013-06-03 ENCOUNTER — Encounter (HOSPITAL_COMMUNITY)
Admission: RE | Admit: 2013-06-03 | Discharge: 2013-06-03 | Disposition: A | Discharge status: Home / Self Care | Payer: Medicare Other | Source: Ambulatory Visit | Attending: Cardiovascular Disease | Admitting: Cardiovascular Disease

## 2013-06-03 NOTE — Progress Notes (Addendum)
Today at cardiac rehab pt reports he was restarted on Imdur 30mg  once daily.  Medication list reconciled.  Pt denies chest pain since resuming imdur.  Will continue to monitor.

## 2013-06-05 ENCOUNTER — Encounter: Payer: Self-pay | Admitting: Cardiovascular Disease

## 2013-06-05 ENCOUNTER — Telehealth: Payer: Self-pay | Admitting: *Deleted

## 2013-06-05 ENCOUNTER — Other Ambulatory Visit: Payer: Self-pay | Admitting: *Deleted

## 2013-06-05 ENCOUNTER — Encounter (HOSPITAL_COMMUNITY)
Admission: RE | Admit: 2013-06-05 | Discharge: 2013-06-05 | Disposition: A | Discharge status: Home / Self Care | Payer: Medicare Other | Source: Ambulatory Visit | Attending: Cardiovascular Disease | Admitting: Cardiovascular Disease

## 2013-06-05 DIAGNOSIS — I208 Other forms of angina pectoris: Secondary | ICD-10-CM

## 2013-06-05 DIAGNOSIS — R079 Chest pain, unspecified: Secondary | ICD-10-CM

## 2013-06-05 NOTE — Telephone Encounter (Signed)
Per Randa Evens at [facility]. [name] is still having chest pain w/activity relieved w/rest.  Started Imdur 60mg  qd end of last week.  VO Dr. Salena Saner continue Imdur at current dose and keep appt Friday.  Hold Cardiac Rehab until after visit.

## 2013-06-05 NOTE — Progress Notes (Addendum)
Today at cardiac rehab pt c/o chest pain with exertion. Rates 7/10 relieved with rest within 1-2 minutes.    Pt describes as his anginal equivalent same as prior to CABG.  Of note, pt initial symptoms relieved with rest.  Pt continued exercise with reduced workload, symptoms returned.  Again, symptoms relieved with rest within 1-2 minutes.  Pt reports he has been taking Imdur 60mg  daily, not 30mg  as previously instructed.  Pt states he gets similar symptoms at home while walking and using leaf blower.    However is able to do other activities such as golf and yard work that does not reproduce. Pt denies rest symptoms.  Phone call to Dr. Royann Shivers.  Spoke to [place].  Per Dr C, pt instructed to continue Imdur 60mg , keep ov 06/07/13 as scheduled, no rehab until after office visit.  Pt given information about angina symptoms, pt instructed to keep track of when angina occurs to report to Dr. Salena Saner.  Proper use of NTG reinforced and when to call 911.   Pt verbalized understanding.

## 2013-06-05 NOTE — Telephone Encounter (Signed)
Per Dr. Salena Saner. Schedule for PCI to the RCA by Dr. Herbie Baltimore on Friday.  Appt w/Dr. C cancelled for Friday.  Patient notified and voiced understanding.

## 2013-06-06 ENCOUNTER — Telehealth: Payer: Self-pay | Admitting: Cardiovascular Disease

## 2013-06-06 ENCOUNTER — Encounter (HOSPITAL_COMMUNITY): Payer: Self-pay

## 2013-06-06 NOTE — Telephone Encounter (Signed)
Per Joyce Gross in Scheduling, she spoke w/ pt and wife yesterday and instructions given.    Returned call and pt verified x 2.  Pt stated last month when he had a stent put in he had to get labs and X-ray.  Stated no one told him if he needed to do that today.  Letter reviewed as not enough time to mail.  Pt informed orders have been placed and will be done upon arrival at [facility].  Other instructions reviewed w/ wife as pt unable to hear well on phone.  Wife verbalized understanding of all instructions.

## 2013-06-06 NOTE — Telephone Encounter (Signed)
Having procedure (stent) tomorrow morning  Have not been given any instructions.  Please call so they will know what to do. Need to know as soon as possible about labwork.

## 2013-06-07 ENCOUNTER — Encounter: Payer: Self-pay | Admitting: Cardiovascular Disease

## 2013-06-07 ENCOUNTER — Ambulatory Visit: Payer: Medicare Other | Admitting: Cardiovascular Disease

## 2013-06-07 ENCOUNTER — Ambulatory Visit (HOSPITAL_COMMUNITY)
Admission: RE | Admit: 2013-06-07 | Discharge: 2013-06-08 | Disposition: A | Discharge status: Home / Self Care | Payer: Medicare Other | Source: Ambulatory Visit | Attending: Cardiology | Admitting: Cardiology

## 2013-06-07 ENCOUNTER — Encounter (HOSPITAL_COMMUNITY): Payer: Self-pay | Admitting: General Practice

## 2013-06-07 ENCOUNTER — Encounter (HOSPITAL_COMMUNITY)
Admission: RE | Disposition: A | Discharge status: Home / Self Care | Payer: Medicare Other | Source: Ambulatory Visit | Attending: Cardiology

## 2013-06-07 ENCOUNTER — Encounter (HOSPITAL_COMMUNITY): Payer: Medicare Other

## 2013-06-07 DIAGNOSIS — I208 Other forms of angina pectoris: Secondary | ICD-10-CM

## 2013-06-07 DIAGNOSIS — I209 Angina pectoris, unspecified: Secondary | ICD-10-CM | POA: Insufficient documentation

## 2013-06-07 DIAGNOSIS — I4510 Unspecified right bundle-branch block: Secondary | ICD-10-CM | POA: Insufficient documentation

## 2013-06-07 DIAGNOSIS — E119 Type 2 diabetes mellitus without complications: Secondary | ICD-10-CM | POA: Insufficient documentation

## 2013-06-07 DIAGNOSIS — I25810 Atherosclerosis of coronary artery bypass graft(s) without angina pectoris: Secondary | ICD-10-CM | POA: Diagnosis present

## 2013-06-07 DIAGNOSIS — Z955 Presence of coronary angioplasty implant and graft: Secondary | ICD-10-CM

## 2013-06-07 DIAGNOSIS — I2510 Atherosclerotic heart disease of native coronary artery without angina pectoris: Secondary | ICD-10-CM | POA: Insufficient documentation

## 2013-06-07 DIAGNOSIS — I10 Essential (primary) hypertension: Secondary | ICD-10-CM | POA: Diagnosis present

## 2013-06-07 DIAGNOSIS — E785 Hyperlipidemia, unspecified: Secondary | ICD-10-CM | POA: Insufficient documentation

## 2013-06-07 DIAGNOSIS — R931 Abnormal findings on diagnostic imaging of heart and coronary circulation: Secondary | ICD-10-CM | POA: Diagnosis present

## 2013-06-07 DIAGNOSIS — N183 Chronic kidney disease, stage 3 unspecified: Secondary | ICD-10-CM | POA: Diagnosis present

## 2013-06-07 DIAGNOSIS — N189 Chronic kidney disease, unspecified: Secondary | ICD-10-CM | POA: Insufficient documentation

## 2013-06-07 DIAGNOSIS — I129 Hypertensive chronic kidney disease with stage 1 through stage 4 chronic kidney disease, or unspecified chronic kidney disease: Secondary | ICD-10-CM | POA: Insufficient documentation

## 2013-06-07 HISTORY — PX: PERCUTANEOUS CORONARY STENT INTERVENTION (PCI-S): SHX5485

## 2013-06-07 LAB — BASIC METABOLIC PANEL
BUN: 24 mg/dL — ABNORMAL HIGH (ref 6–23)
CO2: 25 mEq/L (ref 19–32)
Calcium: 9.7 mg/dL (ref 8.4–10.5)
Chloride: 107 mEq/L (ref 96–112)
Creatinine, Ser: 1.33 mg/dL (ref 0.50–1.35)
Glucose, Bld: 120 mg/dL — ABNORMAL HIGH (ref 70–99)

## 2013-06-07 LAB — POCT ACTIVATED CLOTTING TIME: Activated Clotting Time: 386 seconds

## 2013-06-07 LAB — PROTIME-INR: INR: 1.07 (ref 0.00–1.49)

## 2013-06-07 LAB — CBC
HCT: 43.4 % (ref 39.0–52.0)
MCH: 30.6 pg (ref 26.0–34.0)
MCHC: 33.6 g/dL (ref 30.0–36.0)
MCV: 91 fL (ref 78.0–100.0)
Platelets: 195 10*3/uL (ref 150–400)
RDW: 13.6 % (ref 11.5–15.5)
WBC: 7.2 10*3/uL (ref 4.0–10.5)

## 2013-06-07 LAB — GLUCOSE, CAPILLARY
Glucose-Capillary: 115 mg/dL — ABNORMAL HIGH (ref 70–99)
Glucose-Capillary: 100 mg/dL — ABNORMAL HIGH (ref 70–99)

## 2013-06-07 SURGERY — PERCUTANEOUS CORONARY STENT INTERVENTION (PCI-S)
Anesthesia: LOCAL

## 2013-06-07 MED ORDER — TICAGRELOR 90 MG PO TABS
90.0000 mg | ORAL_TABLET | Freq: Two times a day (BID) | ORAL | Status: DC
  Administered 2013-06-07 – 2013-06-08 (×2): 90 mg via ORAL
  Filled 2013-06-07 (×3): qty 1

## 2013-06-07 MED ORDER — ASPIRIN EC 81 MG PO TBEC
81.0000 mg | DELAYED_RELEASE_TABLET | Freq: Every morning | ORAL | Status: DC
  Administered 2013-06-08: 09:00:00 81 mg via ORAL
  Filled 2013-06-07: qty 1

## 2013-06-07 MED ORDER — ONDANSETRON HCL 4 MG/2ML IJ SOLN
4.0000 mg | Freq: Four times a day (QID) | INTRAMUSCULAR | Status: DC | PRN
  Administered 2013-06-07 (×2): 4 mg via INTRAVENOUS
  Filled 2013-06-07 (×2): qty 2

## 2013-06-07 MED ORDER — NITROGLYCERIN 0.2 MG/ML ON CALL CATH LAB
INTRAVENOUS | Status: AC
  Filled 2013-06-07: qty 1

## 2013-06-07 MED ORDER — FENTANYL CITRATE 0.05 MG/ML IJ SOLN
INTRAMUSCULAR | Status: AC
  Filled 2013-06-07: qty 2

## 2013-06-07 MED ORDER — FINASTERIDE 5 MG PO TABS
5.0000 mg | ORAL_TABLET | Freq: Every day | ORAL | Status: DC
  Administered 2013-06-08: 5 mg via ORAL
  Filled 2013-06-07: qty 1

## 2013-06-07 MED ORDER — SODIUM CHLORIDE 0.9 % IV SOLN
1.0000 mL/kg/h | INTRAVENOUS | Status: AC
  Administered 2013-06-07: 13:00:00 1 mL/kg/h via INTRAVENOUS

## 2013-06-07 MED ORDER — ONDANSETRON HCL 4 MG/2ML IJ SOLN
4.0000 mg | Freq: Once | INTRAMUSCULAR | Status: AC
  Administered 2013-06-07: 16:00:00 4 mg via INTRAVENOUS

## 2013-06-07 MED ORDER — HYDRALAZINE HCL 20 MG/ML IJ SOLN
INTRAMUSCULAR | Status: AC
  Filled 2013-06-07: qty 1

## 2013-06-07 MED ORDER — LIDOCAINE HCL (PF) 1 % IJ SOLN
INTRAMUSCULAR | Status: AC
  Filled 2013-06-07: qty 30

## 2013-06-07 MED ORDER — SODIUM CHLORIDE 0.9 % IJ SOLN: 3.0000 mL | INTRAMUSCULAR | Status: DC | PRN

## 2013-06-07 MED ORDER — SODIUM CHLORIDE 0.9 % IV SOLN: 250.0000 mL | INTRAVENOUS | Status: DC | PRN

## 2013-06-07 MED ORDER — MORPHINE SULFATE 2 MG/ML IJ SOLN
2.0000 mg | INTRAMUSCULAR | Status: DC | PRN
  Administered 2013-06-07: 15:00:00 2 mg via INTRAVENOUS
  Filled 2013-06-07: qty 1

## 2013-06-07 MED ORDER — SODIUM CHLORIDE 0.9 % IJ SOLN
3.0000 mL | Freq: Two times a day (BID) | INTRAMUSCULAR | Status: DC
  Administered 2013-06-07: 3 mL via INTRAVENOUS

## 2013-06-07 MED ORDER — DIAZEPAM 5 MG/ML IJ SOLN: 2.5000 mg | Freq: Once | INTRAMUSCULAR | Status: DC

## 2013-06-07 MED ORDER — SODIUM CHLORIDE 0.9 % IV SOLN
1.7500 mg/kg/h | INTRAVENOUS | Status: DC
  Filled 2013-06-07: qty 250

## 2013-06-07 MED ORDER — SODIUM CHLORIDE 0.9 % IV SOLN: INTRAVENOUS | Status: DC

## 2013-06-07 MED ORDER — HEPARIN (PORCINE) IN NACL 2-0.9 UNIT/ML-% IJ SOLN
INTRAMUSCULAR | Status: AC
  Filled 2013-06-07: qty 1000

## 2013-06-07 MED ORDER — LABETALOL HCL 5 MG/ML IV SOLN
10.0000 mg | Freq: Once | INTRAVENOUS | Status: AC
  Administered 2013-06-07: 17:00:00 10 mg via INTRAVENOUS
  Filled 2013-06-07: qty 4

## 2013-06-07 MED ORDER — BIVALIRUDIN 250 MG IV SOLR
INTRAVENOUS | Status: AC
  Filled 2013-06-07: qty 250

## 2013-06-07 MED ORDER — SODIUM CHLORIDE 0.9 % IV SOLN
1.0000 mL/kg/h | INTRAVENOUS | Status: DC
  Administered 2013-06-07: 1 mL/kg/h via INTRAVENOUS

## 2013-06-07 MED ORDER — ISOSORBIDE MONONITRATE ER 60 MG PO TB24
60.0000 mg | ORAL_TABLET | Freq: Every day | ORAL | Status: DC
  Administered 2013-06-08: 09:00:00 60 mg via ORAL
  Filled 2013-06-07: qty 1

## 2013-06-07 MED ORDER — NITROGLYCERIN 0.4 MG SL SUBL: 0.4000 mg | SUBLINGUAL_TABLET | SUBLINGUAL | Status: DC | PRN

## 2013-06-07 MED ORDER — LOSARTAN POTASSIUM 50 MG PO TABS
50.0000 mg | ORAL_TABLET | Freq: Every day | ORAL | Status: DC
  Administered 2013-06-08: 50 mg via ORAL
  Filled 2013-06-07: qty 1

## 2013-06-07 MED ORDER — DIAZEPAM 2 MG PO TABS: 2.0000 mg | ORAL_TABLET | Freq: Every evening | ORAL | Status: DC | PRN

## 2013-06-07 MED ORDER — DIAZEPAM 5 MG PO TABS
5.0000 mg | ORAL_TABLET | ORAL | Status: AC
  Administered 2013-06-07: 5 mg via ORAL

## 2013-06-07 MED ORDER — DIAZEPAM 5 MG PO TABS
ORAL_TABLET | ORAL | Status: AC
  Administered 2013-06-07: 5 mg via ORAL
  Filled 2013-06-07: qty 1

## 2013-06-07 MED ORDER — HYDRALAZINE HCL 20 MG/ML IJ SOLN
10.0000 mg | INTRAMUSCULAR | Status: DC | PRN
  Administered 2013-06-07 (×3): 10 mg via INTRAVENOUS
  Filled 2013-06-07 (×2): qty 1

## 2013-06-07 MED ORDER — MIDAZOLAM HCL 2 MG/2ML IJ SOLN
INTRAMUSCULAR | Status: AC
  Filled 2013-06-07: qty 2

## 2013-06-07 MED ORDER — METOPROLOL TARTRATE 50 MG PO TABS
50.0000 mg | ORAL_TABLET | Freq: Two times a day (BID) | ORAL | Status: DC
  Administered 2013-06-07 – 2013-06-08 (×2): 50 mg via ORAL
  Filled 2013-06-07 (×3): qty 1

## 2013-06-07 MED ORDER — ACETAMINOPHEN 325 MG PO TABS: 650.0000 mg | ORAL_TABLET | ORAL | Status: DC | PRN

## 2013-06-07 NOTE — CV Procedure (Signed)
CARDIAC CATHETERIZATION AND PERCUTANEOUS CORONARY INTERVENTION REPORT  NAME:  [name]   MRN: [id number] DOB:  03-14-1935   ADMIT DATE: 06/07/2013 Procedure Date: 06/07/2013  INTERVENTIONAL CARDIOLOGIST: Marykay Lex, M.D., MS PRIMARY CARE PROVIDER: [name], MD PRIMARY CARDIOLOGIST: Thurmon Fair, M.D.  PATIENT:  Charles [name] is a 78 y.o. male with a history of recent CABG in June of 2014. Unfortunately shortly after having CABG the patient started developing exertional angina. The high risk nuclear stress test, and was brought to the Cath Lab on 05/06/2013 was found to have an occluded vein graft to sequential to the OM and ramus both of which had significant lesions.. He also had relatively significant disease in the grafted PDA and PLA. After initial discussion with Dr. Royann Shivers the decision was made to proceed with PCI of the ramus intermedius at that time. Perform PCI on this vessel with a 2.25 mm x 24 mm Promus Premier DES stent postdilated up to 2.45 mm distally a 2.55 mm proximally. A small branch of this vessel was treated with rescue PTCA as it was jailed by the stent. Importantly, I longer than expected stent was used as there was a notable dissection after initial balloon angioplasty. At that point the thought was reviewed see how well his symptoms were controlled with single-vessel PCI, with the knowledge that he may very well require further additional interventions.  He initially did well, but now is again had class 2-3 exertional angina while at cardiac rehabilitation. He is on standard regimen of beta blocker, ARB and nitrate. Dr. Royann Shivers has asked me to take him back to the Cath Lab and perform PCI, most likely of the distal RCA system, as the stress test did show inferior ischemia.  The bypass report indicated that the vein graft to the right system was to the RPDA. Initial reviewing of Dr. Erin Hearing angiography would be suggested to sequential lesions beyond the  insertion of this graft in the PDA and an additional lesion in the PL (RPAV) system.  PRE-OPERATIVE DIAGNOSIS:    Class 2-3 angina  Known coronary disease with recent CABG and PCI  PROCEDURES PERFORMED:    Catheter placement with Native and Graft Angiography  Percutaneous Coronary Intervention of the distal PDA with a Promus Premier DES 2.25 mm x 24 mm postdilated to 2.4 mm.  PROCEDURE:Consent:  Risks of procedure as well as the alternatives and risks of each were explained to the (patient/caregiver).  Consent for procedure obtained. Consent for signed by MD and patient with RN witness -- placed on chart.   PROCEDURE: The patient was brought to the 2nd Floor [facility] in the fasting state and prepped and draped in the usual sterile fashion for Right groin or radial access. As there was potential for a difficult graft intervention, I decided not to use radial access .  Sterile technique was used including antiseptics, cap, gloves, gown, hand hygiene, mask and sheet.  Skin prep: Chlorhexidine.  Time Out: Verified patient identification, verified procedure, site/side was marked, verified correct patient position, special equipment/implants available, medications/allergies/relevent history reviewed, required imaging and test results available.  Performed  Access: Right Common Femoral Artery; 6 Fr Sheath -- fluoroscopically guided modified Seldinger technique   Diagnostic:  JL4, and JR 4 catheters were advanced and exchanged over standard J-wire  Left Coronary Artery Angiography: 5 French JL4  Right Coronary Artery and SVG-RPL Angiography: 6 [place] JR 4 guide  The aortic valve was not crossed, as he's  had a recent complete Left Heart Cath  Sheath:  Sutured in place to be removed with manual pressure for hemostasis.   MEDICATIONS:  Anesthesia:  Local Lidocaine 18 ml  Sedation:  3 mg IV Versed, 75 mcg IV fentanyl ;   Omnipaque Contrast: 120  ml  Anticoagulation: Angiomax Bolus & drip  Anti-Platelet Agent:  Currently on Brilinta and aspirin  Hemodynamics:  Central Aortic / Mean Pressures: 165/68 mmHg; 105 mmHg  Coronary Anatomy:  Left Main: Large-caliber vessel with significant calcified atherosclerosis and roughly 50% mid point stenosis. It bifurcates distally into the LAD, and Circumflex. LAD: Moderate large-caliber vessel that does not appear to reach the apex after he generates 3 major diagonal branches. It actually appears that the LIMA branch goes potentially to either the second or third diagonal branch. Neither of these vessels are very large in diameter. The LAD itself tapers are very small vessel on mid anterior wall.  Left Circumflex: Small to moderate caliber vessel that gives off a major lateral OM that is it also relatively small and diameter before terminating as a small AV groove branch. The proximal portion of the vessel actually tortuous taking to 90 bends with calcified atherosclerosis of roughly 50%. In the OM itself there is a 70% lesion in the midportion. At this point the vessel is maybe 2 mm to 2.25 mm, but not a good PCI target based on the tortuosity of the vessel.  Ramus intermedius: Previously noted as the D1, this vessel bifurcates distally. There is a widely patent stent in the inferior branch is jailed has no notable ostial stenosis.   RCA: Large caliber, dominant vessel with a proximal/mid 70% lesion as calcified and ulcerated in appearance. The distal portion of the vessel tapers down to roughly 50% of the regional diameter before it bifurcates into the RPDA and the  Right Posterior AV Groove Branch (RPAV). From multiple views of this native vessel, it would appear actually that the vein graft is attached to the RPL/RPAV the and not the RPDA. The distal portion of the RCA appears to be at least 70% stenosis as it goes into the bifurcation involving both other branch vessels.  RPDA: Ostial 70% (in  some views perhaps worse) stenosis all by a brief normal segment. There is then a 95-99% near occlusion of the mid/distal vessel.  RPL Sysytem:The RPAV ostial 60-70% stenosis just proximal to the vein graft anastomosis. Beyond the vein graft anastomosis there is a at least 70% stenosis before the vessel then courses in the AV groove to give off 2 PL branches are relatively free of disease.  SVG-RCA/RPAV: The graft itself is widely patent. It does only have one anastomosis and appears to actually to be in the RPAV as opposed to the PDA. The graft is free of any significant disease until the anastomosis site where there is necessary tapering.  After reviewing the angiography with both myself and Dr. Excell Seltzer, the clear-cut potential culprit for angina is the distal PDA lesion.  This is the most significant lesion and is perhaps the most feasible for intervention without disrupting the remainder of the anatomy. The original plan of stent in the proximal portion of the PDA was abandoned after the realization that the graft did not go to the PDA itself, and perhaps jailing the grafted RPAV to jeopardize flow from the vein graft. The native RCA is extensive disease, therefore all efforts are made to preserve vein graft flow. With this in mind, The thought of stenting from  the vein graft into the RPAV was also thought to be unwise as it may jeopardize flow retrograde to the PDA.  Therefore, the decision was made to proceed with PCI of the mid-distal RPDA only. Further discussion With intermittent colleagues will be required to determine the optimal treatment of what is now a four-way lesion coming from an distal native RCA that is severely diseased it bifurcates with continuation of this disease into the PDA and RPAV with continued disease beyond the insertion of the vein graft into the RPAV.    Percutaneous Coronary Intervention:  Mid-distal RPDA 95-90% stenosis Guide: 6 Fr   JR 4 Guidewire: BMW Predilation  Balloon: Mini trek 1.5 mm x 12 mm; used initially to assist with wiring the vessel then predilation  8 Atm x 30 Sec, 8 Atm x 30 Sec Stent: Promus Premier DES 2.25 mm x 24 mm;   12 Atm x 30 Sec Post-dilation Balloon: [place]arro Trek 2.5 mm x 8 mm; a shorter noncompliant balloon was chosen to avoid disrupting the native RCA stenoses.  3 inflations for 45 Sec at proximal and mid and distal stent: 10 Atm, 14, 10  Final Diameter: 2.4 mm proximal and distal, 2.55 mm mid   Post deployment angiography in multiple views, with and without guidewire in place revealed excellent stent deployment and lesion coverage.  There was no evidence of dissection or perforation. The residual lesions were still noted to be roughly 70-80% in the distal RCA and proximal PDA, and 60-70% in the RPAV.    PATIENT DISPOSITION:    The patient was transferred to the PACU holding area in a hemodynamicaly stable, chest pain free condition.  The patient tolerated the procedure well, and there were no complications.  EBL:   < 20 ml  The patient was stable before, during, and after the procedure.  POST-OPERATIVE DIAGNOSIS:    Widely patent recently placed stent in the ramus intermedius with no notable changes in the left coronary system. The LIMA was not injected as it had been widely patent less than a month ago.  Widely patent graft to the right system with diffuse disease in the proximal and mid/distal native RCA. Moderate to severe bifurcation disease of the distal RCA that involves the ostial RPDA as well as the ostial and proximal RPAV with more severe downstream disease in the RPDA.  Successful single site intervention on the distal RPDA with a Promus Premier DES with excellent TIMI-3 flow now to a prominent PDA that reaches all the way to the apex.  Importantly, there is brisk flow noted in both the native and the graft vessel to the RCA suggesting adequate flow to the PDA and PAV with injection of the native RCA leaving to  full filling of the vein graft and injection the vein graft leading to full filling of the RCA.  PLAN OF CARE:  Observe overnight post PCI. He'll probably need lipid medications for sheath removal as his pressure was elevated in the Cath Lab.  I spent over 45 minutes reviewing the angiography with both the patient and in discussing it with his wife. The hope is that intervention on the 95+ percent lesion would correlate best with his inferior in the on the stress test and hopefully allow him to have less notable angina.  I will review these images with a group of admission colleagues to determine the appropriate next that if additional intervention is required on the RCA system. The obtuse marginal lesion would be a more difficult  lesion to intervene upon simply because of the tortuosity of the native vessel in its relatively small diameter with diffuse disease making the insertion of a drug-eluting stent very difficult with no "normal vessel ". A short bare-metal stent to the focal 70% lesion would be possibility.  Anticipate he'll be discharged the morning. Unless he has persistent angina simply walking along the hallways. In which case we would consider keeping him until Monday for staged more complicated PCI.   Marykay Lex, M.D., M.S. [facility] [address] [place], [place]  [address]  [phone]  06/07/2013 12:12 PM

## 2013-06-07 NOTE — Interval H&P Note (Signed)
History and Physical Interval Note:  06/07/2013 8:49 AM  In the interim since this last clinic visit, Charles [name] is again suffering from progressively worsening episodes of exertional angina similar to his pre-PCI symptoms.  He also notes some "gasping" dyspnea not associated with exertion that may be Brilinta related.   He is trying to go to [facility], but is limited in his level of participation by his Angina.  His symptoms are consistent with at least Class II if not Class III angina and he is on a BB, nitrate & ARB.  His BP (although high this AM) has been relatively well controlled.  His initial PCI of the Ramus was done as a potential staged procedure, as there was also notable disease in the downstream rPDA after the SVG insertion as well as in the rPL & Cx-OM.  With his recurrent angina, he called back in to the office.  Dr. Royann Shivers felt that we should proceed with a staged PCI of the rPDA +/- rPL as the next "best" PCI targets.  The OM lesion is in a small caliber, diffusely diseased vessel that my not be a good PCI target, it is also not as severe in extent of stenosis.   Charles [name]  has presented today for surgery, with the diagnosis of CAD- CLASS II-III Angina  The various methods of treatment have been discussed with the patient and family. After consideration of risks, benefits and other options for treatment, the patient has consented to  Procedure(s): PERCUTANEOUS CORONARY STENT INTERVENTION (PCI-S) (N/A) as a surgical intervention .  The patient's history has been reviewed, patient examined, no change in status, stable for surgery.  I have reviewed the patient's chart and labs.  Questions were answered to the patient's satisfaction.     Charles [name]  Cath Lab Visit (complete for each Cath Lab visit)  Clinical Evaluation Leading to the Procedure:   ACS: no  Non-ACS:    Anginal Classification: CCS III  Anti-ischemic medical therapy: Maximal Therapy (2 or more classes of  medications)  Non-Invasive Test Results: No non-invasive testing performed  Prior CABG: Previous CABG

## 2013-06-07 NOTE — Interval H&P Note (Deleted)
History and Physical Interval Note:  Principal Problem:   Angina, class III Active Problems:   CAD- S/P CABG x 5 (LIMA-LAD, SVG-ramus intermedius, SVG-OM1, SVG to distal RCA, SVG-PDA) 01/21/13   DIABETES MELLITUS, TYPE II   HYPERLIPIDEMIA   HYPERTENSION  Charles Mcnaught W, MD

## 2013-06-07 NOTE — H&P (View-Only) (Signed)
Patient ID: Charles [name], male   DOB: 05/23/1935, 78 y.o.   MRN: [id number]      Reason for office visit CAD, F/U after PCI-stent   Charles [name] had persistent exertional angina pectoris following his 5 vessel bypass procedure and underwent repeat coronary angiography on September 29. It showed that there was occlusion of the new sequential saphenous vein graft bypass to the ramus intermedius and OM branch of the left circumflex coronary artery, as well as occlusion of the distal limb of the sequential bypass to the distal RCA and posterior descending artery. The most important ischemic territory appeared to be that of the ramus intermedius artery. He underwent placement of a drug-eluting stent (Promus Premier 2.25 x 24 mm). He has not had complete resolution of his exertional symptoms. He is able to climb the hill that would consistently meet his threshold for angina pectoris without stopping. He feels great.  We discussed critical need for an interrupted dual antiplatelet therapy to avoid the risk of stent thrombosis. Also reviewed the possibility for in-stent restenosis since the treated vessel was relatively small in diameter. Also reviewed the need for aggressive treatment of risk factors, especially diabetes and hyperlipidemia 2 event progression of native disease.   Allergies  Allergen Reactions  . Atorvastatin     REACTION: myalgia  . Ezetimibe     REACTION: dizzy  . Lovastatin     REACTION: weakness  . Percocet [Oxycodone-Acetaminophen] Other (See Comments)    hallucinations    Current Outpatient Prescriptions  Medication Sig Dispense Refill  . aspirin 81 MG EC tablet Take 1 tablet (81 mg total) by mouth daily.      . Cholecalciferol (VITAMIN D3) 2000 UNITS TABS Take 2,000 Units by mouth daily.      . Coenzyme Q10 (COQ10) 100 MG CAPS Take 100 mg by mouth daily.      . diazepam (VALIUM) 2 MG tablet Take 1 tablet (2 mg total) by mouth at bedtime as needed for anxiety.  30 tablet  1    . finasteride (PROSCAR) 5 MG tablet Take 5 mg by mouth daily.      . isosorbide mononitrate (IMDUR) 60 MG 24 hr tablet Take 1 tablet (60 mg total) by mouth daily.  30 tablet  5  . KRILL OIL 1000 MG CAPS Take 1,000 mg by mouth daily.       . losartan (COZAAR) 50 MG tablet Take 50 mg by mouth daily.      . metoprolol (LOPRESSOR) 50 MG tablet Take 1 tablet (50 mg total) by mouth 2 (two) times daily.  60 tablet  5  . nitroGLYCERIN (NITROSTAT) 0.4 MG SL tablet Place 1 tablet (0.4 mg total) under the tongue every 5 (five) minutes as needed for chest pain.  25 tablet  6  . OVER THE COUNTER MEDICATION Take 1 tablet by mouth daily. Macular degeneration vitamin.      . pravastatin (PRAVACHOL) 40 MG tablet Take 1 tablet (40 mg total) by mouth every evening.  30 tablet  6  . Ticagrelor (BRILINTA) 90 MG TABS tablet Take 1 tablet (90 mg total) by mouth 2 (two) times daily.  60 tablet  10   No current facility-administered medications for this visit.    Past Medical History  Diagnosis Date  . Hypertension   . Hyperlipidemia     diet controlled, statins and zetia intolerant  . Retinopathy     mild glaucomea  . Transient ischemic attack 1999  . Histoplasmosis   .   Chronic cough 08/2009    -allergy profile- june 2,2011- neg. -Max GERD rx/ off oils January 07 2010 x 3 weeks only but no benefit. - Sinus CT rec July 11,2011- refused due to claustrophobia -MCT neg for asthma 03/01/10 -Add 1st Gen H1 July 11,2011- better  . Macular degeneration   . PONV (postoperative nausea and vomiting)     extreme nausea  . Coronary artery disease     CABG 01-2013, stent 04-2013    . Diabetes mellitus     no meds    Past Surgical History  Procedure Laterality Date  . Cervical spine surgery  2002    plate  . Cholecystectomy  1985  . Coronary artery bypass graft N/A 01/21/2013    Procedure: Coronary Arery Bypass Grafting  Times Five Using Left Internal Mammary Artery and Right Saphenous Leg Vein Harvested Endoscopically;   Surgeon: Steven C Hendrickson, MD;  Location: [facility] OR;  Service: Open Heart Surgery;  Laterality: N/A;    Family History  Problem Relation Age of Onset  . Coronary artery disease Neg Hx   . Stroke Neg Hx   . Colon cancer Neg Hx   . Prostate cancer Neg Hx   . Atopy Neg Hx   . Asthma Neg Hx   . Hypertension Mother   . Hypertension Father   . Diabetes Father   . Cancer Father     colon  . Hypertension Brother   . Cancer Brother     lung  . Hypertension Sister   . Cancer Sister     lung  . Diabetes Brother   . Diabetes Sister   . Diabetes      Grandparents   . Lung cancer Brother   . Lung cancer Sister     History   Social History  . Marital Status: Married    Spouse Name: N/A    Number of Children: 2  . Years of Education: N/A   Occupational History  . reitred    Social History Main Topics  . Smoking status: Former Smoker -- 1.00 packs/day for 10 years    Types: Cigarettes    Quit date: 01/17/1977  . Smokeless tobacco: Never Used     Comment: Stopped at age 37   . Alcohol Use: No     Comment: seldom  . Drug Use: No  . Sexual Activity: Not on file   Other Topics Concern  . Not on file   Social History Narrative  . No narrative on file    Review of systems: The patient specifically denies any chest pain at rest or with exertion, dyspnea at rest or with exertion, orthopnea, paroxysmal nocturnal dyspnea, syncope, palpitations, focal neurological deficits, intermittent claudication, lower extremity edema, unexplained weight gain, cough, hemoptysis or wheezing.  The patient also denies abdominal pain, nausea, vomiting, dysphagia, diarrhea, constipation, polyuria, polydipsia, dysuria, hematuria, frequency, urgency, abnormal bleeding or bruising, fever, chills, unexpected weight changes, mood swings, change in skin or hair texture, change in voice quality, auditory or visual problems, allergic reactions or rashes, new musculoskeletal complaints other than usual "aches  and pains".   PHYSICAL EXAM BP 122/68  Pulse 57  Resp 16  Ht 5' 11.75" (1.822 m)  Wt 180 lb 6.4 oz (81.829 kg)  BMI 24.65 kg/m2  General: Alert, oriented x3, no distress Head: no evidence of trauma, PERRL, EOMI, no exophtalmos or lid lag, no myxedema, no xanthelasma; normal ears, nose and oropharynx Neck: normal jugular venous pulsations and no hepatojugular reflux;   brisk carotid pulses without delay and no carotid bruits Chest: clear to auscultation, no signs of consolidation by percussion or palpation, normal fremitus, symmetrical and full respiratory excursions, healed sternotomy scar Cardiovascular: normal position and quality of the apical impulse, regular rhythm, normal first and widely split second heart sounds, no murmurs, rubs or gallops Abdomen: no tenderness or distention, no masses by palpation, no abnormal pulsatility or arterial bruits, normal bowel sounds, no hepatosplenomegaly Extremities: no clubbing, cyanosis or edema; 2+ radial, ulnar and brachial pulses bilaterally; 2+ right femoral, posterior tibial and dorsalis pedis pulses; 2+ left femoral, posterior tibial and dorsalis pedis pulses; no subclavian or femoral bruits Neurological: grossly nonfocal   EKG: Sinus rhythm, right bundle branch block  Lipid Panel     Component Value Date/Time   CHOL 163 02/26/2013 0806   TRIG 105.0 02/26/2013 0806   HDL 45.10 02/26/2013 0806   CHOLHDL 4 02/26/2013 0806   VLDL 21.0 02/26/2013 0806   LDLCALC 97 02/26/2013 0806    BMET    Component Value Date/Time   NA 140 05/07/2013 0545   K 3.8 05/07/2013 0545   CL 103 05/07/2013 0545   CO2 24 05/07/2013 0545   GLUCOSE 170* 05/07/2013 0545   BUN 20 05/07/2013 0545   CREATININE 1.22 05/07/2013 0545   CREATININE 1.37* 05/01/2013 0951   CALCIUM 9.6 05/07/2013 0545   GFRNONAA 56* 05/07/2013 0545   GFRAA 65* 05/07/2013 0545     ASSESSMENT AND PLAN CAD- S/P CABG x 5 (LIMA-LAD, SVG-ramus intermedius, SVG-OM1, SVG to distal RCA, SVG-PDA)  01/21/13 CATH 05/06/2013: occlusion of the sequential SVG to ramus and OM and of the distal limb of the sequential SVG to RCA and PDA. PCI with a DES to the ramus by Dr. Harding (PROMUS PREMIER DES 2.25 mm 24 mm, with rescue PTCA of an inferior branch).   We discussed critical need for an interrupted dual antiplatelet therapy to avoid the risk of stent thrombosis. Also reviewed the possibility for in-stent restenosis since the treated vessel was relatively small in diameter. Also reviewed the need for aggressive treatment of risk factors, especially diabetes and hyperlipidemia 2 event progression of native disease.  Reevaluate lipid profile and hemoglobin A1c, it has been roughly 3 months since initiation of statin therapy. Target LDL cholesterol less than 70 mg/dL. Recheck renal function following contrast exposure.   Orders Placed This Encounter  Procedures  . Comp Met (CMET)  . HgB A1c  . Lipid Profile  . EKG 12-Lead   Sharmain Lastra  Eugenie Harewood, MD, FACC [facility] [phone] office [phone] pager   

## 2013-06-07 NOTE — Research (Signed)
AVERT Research Study Informed Consent   Subject Name: Charles [name]  Subject met inclusion and exclusion criteria.  The informed consent form, study requirements and expectations were reviewed with the subject and questions and concerns were addressed prior to the signing of the consent form.  The subject verbalized understanding of the trail requirements.  The subject agreed to participate in the AVERT trial and signed the informed consent at 0715 on 06/07/2013.  The informed consent was obtained prior to performance of any protocol-specific procedures for the subject.  A copy of the signed informed consent was given to the subject and a copy was placed in the subject's medical record.  Claire Shown 06/07/2013, 9:11 AM

## 2013-06-07 NOTE — Interval H&P Note (Signed)
History and Physical Interval Note:  06/07/2013 9:02 AM  Charles [name]  has presented today for Cardiac Cath & Planned PCI.   He has had a High Risk Myoview ST - underwent cath with PCI to the Ramus (as the least difficult option of 3-4  Target lesions), but is now having recurrent Class II-III Angina.  Principal Problem:   Angina, class III Active Problems:   Abnormal nuclear cardiac imaging test - Inferior Ischemia   CAD- S/P CABG x 5 (LIMA-LAD, SVG-ramus intermedius, SVG-OM1, SVG to distal RCA, SVG-PDA) 01/21/13   DIABETES MELLITUS, TYPE II   HYPERLIPIDEMIA   HYPERTENSION   He is referred for a staged PCI of the rPDA +/- rPL.  The procedure with Risks/Benefits/Alternatives and Indications was reviewed with the patient and his wife.  All questions were answered.    Risks / Complications include, but not limited to: Death, MI, CVA/TIA, VF/VT (with defibrillation), Bradycardia (need for temporary pacer placement), contrast induced nephropathy, bleeding / bruising / hematoma / pseudoaneurysm, vascular or coronary injury (with possible emergent CT or Vascular Surgery), adverse medication reactions, infection.    The patient and wife voice understanding and agree to proceed.     Latrena Benegas W

## 2013-06-08 ENCOUNTER — Encounter: Payer: Self-pay | Admitting: Cardiovascular Disease

## 2013-06-08 DIAGNOSIS — R9439 Abnormal result of other cardiovascular function study: Secondary | ICD-10-CM

## 2013-06-08 DIAGNOSIS — I209 Angina pectoris, unspecified: Secondary | ICD-10-CM

## 2013-06-08 DIAGNOSIS — N183 Chronic kidney disease, stage 3 unspecified: Secondary | ICD-10-CM | POA: Diagnosis present

## 2013-06-08 DIAGNOSIS — I4510 Unspecified right bundle-branch block: Secondary | ICD-10-CM | POA: Diagnosis present

## 2013-06-08 DIAGNOSIS — Z955 Presence of coronary angioplasty implant and graft: Secondary | ICD-10-CM

## 2013-06-08 DIAGNOSIS — I25810 Atherosclerosis of coronary artery bypass graft(s) without angina pectoris: Secondary | ICD-10-CM

## 2013-06-08 LAB — CBC
MCH: 30.3 pg (ref 26.0–34.0)
MCHC: 32.6 g/dL (ref 30.0–36.0)
MCV: 92.9 fL (ref 78.0–100.0)
Platelets: 203 10*3/uL (ref 150–400)
RDW: 14.1 % (ref 11.5–15.5)
WBC: 10 10*3/uL (ref 4.0–10.5)

## 2013-06-08 LAB — BASIC METABOLIC PANEL
BUN: 21 mg/dL (ref 6–23)
Chloride: 106 mEq/L (ref 96–112)
GFR calc Af Amer: 55 mL/min — ABNORMAL LOW (ref >90)
GFR calc non Af Amer: 47 mL/min — ABNORMAL LOW (ref >90)
Potassium: 4.2 mEq/L (ref 3.5–5.1)

## 2013-06-08 MED ORDER — ACETAMINOPHEN 325 MG PO TABS: 650.0000 mg | ORAL_TABLET | ORAL | Status: DC | PRN

## 2013-06-08 MED ORDER — RANOLAZINE ER 500 MG PO TB12: 1000.0000 mg | ORAL_TABLET | Freq: Two times a day (BID) | ORAL | Status: DC

## 2013-06-08 MED ORDER — RANOLAZINE ER 1000 MG PO TB12: 1000.0000 mg | ORAL_TABLET | Freq: Two times a day (BID) | ORAL | Status: DC

## 2013-06-08 NOTE — Progress Notes (Addendum)
Pt. Seen and examined. Agree with the NP/PA-C note as written.  Not complaining of chest pain today, anxious to go home. See Dr. Elissa Hefty detailed cath note and recommendations for medical therapy. He is on Brillinta and Aspirin. Start Ranexa 1000 mg BID - he can get samples in the office, will need Rx. Ok for discharge home today. Plan for follow-up with Dr. Royann Shivers in 1-2 weeks.  Chrystie Nose, MD, [facility] Attending Cardiologist [facility]

## 2013-06-08 NOTE — Discharge Summary (Signed)
Patient ID: Charles [name],  MRN: [id number], DOB/AGE: May 25, 1935 78 y.o.  Admit date: 06/07/2013 Discharge date: 06/08/2013  Primary Care Provider:  Primary Cardiologist: Dr Royann Shivers  Discharge Diagnoses Principal Problem:   Angina, class III Active Problems:   Abnormal nuclear cardiac imaging test - Inferior Ischemia   S/P SVG-RI/OM DES 05/06/13- staged DES to RCA 06/07/13   HYPERTENSION   CAD- S/P CABG x 5 (LIMA-LAD, SVG-RI-OM1, SVG to distal RCA, SVG-PDA) 01/21/13   Chronic renal insufficiency, stage III (moderate)   DIABETES MELLITUS, TYPE II   HYPERLIPIDEMIA   RBBB    Procedures: PDA DES 06/07/13   Hospital Course:   78 y/o s/p CABG June 2014 with progressive angina. He had progression of his CAD and graft failure and is s/p SVG-RI/OM DES 05/06/13. He continued to have angina and was admitted for RCA/PDA PCI. He underwent RCA DES 06/07/13. He apparently has some residual CAD that we will try to Rx medically for now but its possible this could be addressed percutaneously if needed, see Dr Herbie Baltimore complete not from 06/07/13. The pt was seen by Dr Rennis Golden the morning of 11/1 and felt to be stable for discharge. Ranexa was added at discharge. He was enrolled in the AVERT trial this admission as well.    Discharge Vitals:  Blood pressure 160/84, pulse 88, temperature 98.6 F (37 C), temperature source Oral, resp. rate 18, height 5\' 11"  (1.803 m), weight 178 lb 9.2 oz (81 kg), SpO2 97.00%.    Labs: Results for orders placed during the hospital encounter of 06/07/13 (from the past 48 hour(s))  BASIC METABOLIC PANEL     Status: Abnormal   Collection Time    06/07/13  7:26 AM      Result Value Range   Sodium 143  135 - 145 mEq/L   Potassium 4.1  3.5 - 5.1 mEq/L   Chloride 107  96 - 112 mEq/L   CO2 25  19 - 32 mEq/L   Glucose, Bld 120 (*) 70 - 99 mg/dL   BUN 24 (*) 6 - 23 mg/dL   Creatinine, Ser 7.82  0.50 - 1.35 mg/dL   Calcium 9.7  8.4 - 95.6 mg/dL   GFR calc non Af Amer 50  (*) >90 mL/min   GFR calc Af Amer 58 (*) >90 mL/min   Comment: (NOTE)     The eGFR has been calculated using the CKD EPI equation.     This calculation has not been validated in all clinical situations.     eGFR's persistently <90 mL/min signify possible Chronic Kidney     Disease.  CBC     Status: None   Collection Time    06/07/13  7:26 AM      Result Value Range   WBC 7.2  4.0 - 10.5 K/uL   RBC 4.77  4.22 - 5.81 MIL/uL   Hemoglobin 14.6  13.0 - 17.0 g/dL   HCT 21.3  08.6 - 57.8 %   MCV 91.0  78.0 - 100.0 fL   MCH 30.6  26.0 - 34.0 pg   MCHC 33.6  30.0 - 36.0 g/dL   RDW 46.9  62.9 - 52.8 %   Platelets 195  150 - 400 K/uL  PROTIME-INR     Status: None   Collection Time    06/07/13  7:26 AM      Result Value Range   Prothrombin Time 13.7  11.6 - 15.2 seconds   INR 1.07  0.00 - 1.49  GLUCOSE, CAPILLARY     Status: Abnormal   Collection Time    06/07/13  7:48 AM      Result Value Range   Glucose-Capillary 115 (*) 70 - 99 mg/dL   Comment 1 Documented in Chart     Comment 2 Notify RN    POCT ACTIVATED CLOTTING TIME     Status: None   Collection Time    06/07/13 10:33 AM      Result Value Range   Activated Clotting Time 386    GLUCOSE, CAPILLARY     Status: Abnormal   Collection Time    06/07/13 12:30 PM      Result Value Range   Glucose-Capillary 100 (*) 70 - 99 mg/dL  GLUCOSE, CAPILLARY     Status: Abnormal   Collection Time    06/07/13  7:31 PM      Result Value Range   Glucose-Capillary 133 (*) 70 - 99 mg/dL  CBC     Status: None   Collection Time    06/08/13  6:39 AM      Result Value Range   WBC 10.0  4.0 - 10.5 K/uL   RBC 4.65  4.22 - 5.81 MIL/uL   Hemoglobin 14.1  13.0 - 17.0 g/dL   HCT 16.1  09.6 - 04.5 %   MCV 92.9  78.0 - 100.0 fL   MCH 30.3  26.0 - 34.0 pg   MCHC 32.6  30.0 - 36.0 g/dL   RDW 40.9  81.1 - 91.4 %   Platelets 203  150 - 400 K/uL  BASIC METABOLIC PANEL     Status: Abnormal   Collection Time    06/08/13  6:39 AM      Result Value  Range   Sodium 143  135 - 145 mEq/L   Potassium 4.2  3.5 - 5.1 mEq/L   Chloride 106  96 - 112 mEq/L   CO2 24  19 - 32 mEq/L   Glucose, Bld 99  70 - 99 mg/dL   BUN 21  6 - 23 mg/dL   Creatinine, Ser 7.82 (*) 0.50 - 1.35 mg/dL   Calcium 9.3  8.4 - 95.6 mg/dL   GFR calc non Af Amer 47 (*) >90 mL/min   GFR calc Af Amer 55 (*) >90 mL/min   Comment: (NOTE)     The eGFR has been calculated using the CKD EPI equation.     This calculation has not been validated in all clinical situations.     eGFR's persistently <90 mL/min signify possible Chronic Kidney     Disease.    Disposition:  Follow-up Information   Follow up with Thurmon Fair, MD. (office will call you)    Specialty:  Cardiology   Contact information:   [address] [place] [address] [phone]       Discharge Medications:    Medication List         acetaminophen 325 MG tablet  Commonly known as:  TYLENOL  Take 2 tablets (650 mg total) by mouth every 4 (four) hours as needed.     aspirin EC 81 MG tablet  Take 81 mg by mouth every morning.     CoQ10 100 MG Caps  Take 100 mg by mouth daily.     diazepam 2 MG tablet  Commonly known as:  VALIUM  Take 2 mg by mouth at bedtime as needed for sleep.     finasteride 5 MG tablet  Commonly known as:  PROSCAR  Take 5 mg by mouth daily.     isosorbide mononitrate 60 MG 24 hr tablet  Commonly known as:  IMDUR  Take 60 mg by mouth daily.     Krill Oil 1000 MG Caps  Take 1,000 mg by mouth daily.     losartan 50 MG tablet  Commonly known as:  COZAAR  Take 50 mg by mouth daily.     metoprolol 50 MG tablet  Commonly known as:  LOPRESSOR  Take 50 mg by mouth 2 (two) times daily.     nitroGLYCERIN 0.4 MG SL tablet  Commonly known as:  NITROSTAT  Place 0.4 mg under the tongue every 5 (five) minutes as needed for chest pain.     OVER THE COUNTER MEDICATION  Take 1 tablet by mouth daily. Macular degeneration vitamin.     pravastatin 40 MG  tablet  Commonly known as:  PRAVACHOL  Take 40 mg by mouth every evening.     ranolazine 1000 MG SR tablet  Commonly known as:  RANEXA  Take 1 tablet (1,000 mg total) by mouth 2 (two) times daily.  Start taking on:  06/09/2013     Ticagrelor 90 MG Tabs tablet  Commonly known as:  BRILINTA  Take 90 mg by mouth 2 (two) times daily.     Vitamin D3 2000 UNITS Tabs  Take 2,000 Units by mouth daily.         Duration of Discharge Encounter: Greater than 30 minutes including physician time.  Jolene Provost PA-C 06/08/2013 10:09 AM

## 2013-06-08 NOTE — Progress Notes (Signed)
CARDIAC REHAB PHASE I   PRE:  Rate/Rhythm: 85 SR  BP:  Supine: 168/83  Sitting:   Standing:    SaO2:   MODE:  Ambulation: 1000 ft   POST:  Rate/Rhythm: 92 SR  BP:  Supine:   Sitting: 160/84  Standing:    SaO2:  [id number] Pt tolerated ambulation well without c/o of cp or SOB. BP up before and after walk. Pt to recliner after walk with call light in reach. Reviewed stent discharge education with pt. He voices understanding. He agrees to [employer]. CRP in [place]. He is currently in the program and is close to graduation. I will send them information with this admission and we will try to see if he can continue the program.  Melina Copa RN 06/08/2013 8:45 AM

## 2013-06-08 NOTE — Progress Notes (Signed)
Subjective:  No chest pain, anxious to go home.  Objective:  Vital Signs in the last 24 hours: Temp:  [97.8 F (36.6 C)-99.2 F (37.3 C)] 98.6 F (37 C) (11/01 0800) Pulse Rate:  [50-108] 88 (11/01 0800) Resp:  [18-20] 18 (11/01 0800) BP: (137-191)/(53-94) 168/83 mmHg (11/01 0800) SpO2:  [96 %-100 %] 97 % (11/01 0800) Weight:  [178 lb 9.2 oz (81 kg)] 178 lb 9.2 oz (81 kg) (11/01 0500)  Intake/Output from previous day:  Intake/Output Summary (Last 24 hours) at 06/08/13 0824 Last data filed at 06/07/13 2200  Gross per 24 hour  Intake 1193.6 ml  Output    650 ml  Net  543.6 ml    Physical Exam: General appearance: alert, cooperative and no distress Lungs: clear to auscultation bilaterally Heart: regular rate and rhythm Extremities: no hematoma Rt groin   Rate: 78  Rhythm: normal sinus rhythm, RBBB  Lab Results:  Recent Labs  06/07/13 0726 06/08/13 0639  WBC 7.2 10.0  HGB 14.6 14.1  PLT 195 203    Recent Labs  06/07/13 0726 06/08/13 0639  NA 143 143  K 4.1 4.2  CL 107 106  CO2 25 24  GLUCOSE 120* 99  BUN 24* 21  CREATININE 1.33 1.40*   No results found for this basename: TROPONINI, CK, MB,  in the last 72 hours  Recent Labs  06/07/13 0726  INR 1.07    Imaging: Imaging results have been reviewed  Cardiac Studies:  Assessment/Plan:   Principal Problem:   Angina, class III Active Problems:   Abnormal nuclear cardiac imaging test - Inferior Ischemia   S/P SVG-RI/OM DES 05/06/13- staged DES to RCA 06/07/13   HYPERTENSION   CAD- S/P CABG x 5 (LIMA-LAD, SVG-RI-OM1, SVG to distal RCA, SVG-PDA) 01/21/13   Chronic renal insufficiency, stage III (moderate)   DIABETES MELLITUS, TYPE II   HYPERLIPIDEMIA   RBBB    PLAN: 78 y/o s/p CABG June 2014 with progressive angina. He had progression of his CAD and graft failure and is s/p SVG-RI/OM DES 05/06/13 and s/p RCA DES 06/07/13. He apparently has some residual CAD that we will try to Rx medically for now.  He has been enrolled in the AVERT trial (stage 3 CRI). Discharge today and follow up with Dr Royann Shivers 2 weeks. Gradual increase in activity over the next 2 weeks. ? If he is a candidate for the new antiplatelet agent. Consider adding a PPI.  Corine Shelter PA-C Beeper [phone] 06/08/2013, 8:24 AM

## 2013-06-09 ENCOUNTER — Encounter: Payer: Self-pay | Admitting: Cardiovascular Disease

## 2013-06-10 ENCOUNTER — Encounter: Payer: Self-pay | Admitting: Cardiovascular Disease

## 2013-06-11 ENCOUNTER — Encounter: Payer: Self-pay | Admitting: Cardiovascular Disease

## 2013-06-11 ENCOUNTER — Emergency Department (HOSPITAL_BASED_OUTPATIENT_CLINIC_OR_DEPARTMENT_OTHER)
Admission: EM | Admit: 2013-06-11 | Discharge: 2013-06-11 | Disposition: A | Discharge status: Home / Self Care | Payer: Medicare Other | Attending: Emergency Medicine | Admitting: Emergency Medicine

## 2013-06-11 ENCOUNTER — Encounter (HOSPITAL_BASED_OUTPATIENT_CLINIC_OR_DEPARTMENT_OTHER): Payer: Self-pay | Admitting: Emergency Medicine

## 2013-06-11 DIAGNOSIS — E785 Hyperlipidemia, unspecified: Secondary | ICD-10-CM | POA: Insufficient documentation

## 2013-06-11 DIAGNOSIS — Z951 Presence of aortocoronary bypass graft: Secondary | ICD-10-CM | POA: Insufficient documentation

## 2013-06-11 DIAGNOSIS — I10 Essential (primary) hypertension: Secondary | ICD-10-CM | POA: Insufficient documentation

## 2013-06-11 DIAGNOSIS — K5641 Fecal impaction: Secondary | ICD-10-CM | POA: Insufficient documentation

## 2013-06-11 DIAGNOSIS — Z79899 Other long term (current) drug therapy: Secondary | ICD-10-CM | POA: Insufficient documentation

## 2013-06-11 DIAGNOSIS — E119 Type 2 diabetes mellitus without complications: Secondary | ICD-10-CM | POA: Insufficient documentation

## 2013-06-11 DIAGNOSIS — I2510 Atherosclerotic heart disease of native coronary artery without angina pectoris: Secondary | ICD-10-CM | POA: Insufficient documentation

## 2013-06-11 DIAGNOSIS — Z8673 Personal history of transient ischemic attack (TIA), and cerebral infarction without residual deficits: Secondary | ICD-10-CM | POA: Insufficient documentation

## 2013-06-11 DIAGNOSIS — Z8619 Personal history of other infectious and parasitic diseases: Secondary | ICD-10-CM | POA: Insufficient documentation

## 2013-06-11 DIAGNOSIS — Z87891 Personal history of nicotine dependence: Secondary | ICD-10-CM | POA: Insufficient documentation

## 2013-06-11 DIAGNOSIS — Z8669 Personal history of other diseases of the nervous system and sense organs: Secondary | ICD-10-CM | POA: Insufficient documentation

## 2013-06-11 DIAGNOSIS — Z7982 Long term (current) use of aspirin: Secondary | ICD-10-CM | POA: Insufficient documentation

## 2013-06-11 MED ORDER — POLYETHYLENE GLYCOL 3350 17 GM/SCOOP PO POWD: 17.0000 g | Freq: Every day | ORAL | Status: DC

## 2013-06-11 MED ORDER — MINERAL OIL RE ENEM
ENEMA | RECTAL | Status: AC
  Administered 2013-06-11: 1
  Filled 2013-06-11: qty 1

## 2013-06-11 MED FILL — Sodium Chloride IV Soln 0.9%: INTRAVENOUS | Qty: 50 | Status: AC

## 2013-06-11 NOTE — ED Notes (Signed)
Patient states he had a cardiac cath four days ago and had cardiac stents placed.  States each time he has had this procedure he develops constipation.  Has used otc laxatives with no relief.

## 2013-06-11 NOTE — ED Provider Notes (Signed)
CSN: [id number]     Arrival date & time 06/11/13  1033 History   First MD Initiated Contact with Patient 06/11/13 1041     Chief Complaint  Patient presents with  . Constipation   (Consider location/radiation/quality/duration/timing/severity/associated sxs/prior Treatment) Patient is a 78 y.o. male presenting with constipation.  Constipation  Pt with multiple medical problems had stent placed about 4-5 days ago. States he has been constipated since then. Notes that this happened with previous stent but he was able to manage by himself at home. He reports no stool output in 2 days despite laxatives, increased fruit/fiber in diet. He tried enema this AM but was not able to hold it in for long. Feels like there is a large stool that won't come out.   Past Medical History  Diagnosis Date  . Hypertension   . Hyperlipidemia     diet controlled, statins and zetia intolerant  . Retinopathy     mild glaucomea  . Transient ischemic attack 1999  . Histoplasmosis   . Chronic cough 08/2009    -allergy profile- june 2,2011- neg. -Max GERD rx/ off oils January 07 2010 x 3 weeks only but no benefit. - Sinus CT rec July 11,2011- refused due to claustrophobia -MCT neg for asthma 03/01/10 -Add 1st Gen H1 July 11,2011- better  . Macular degeneration   . PONV (postoperative nausea and vomiting)     extreme nausea  . Coronary artery disease     CABG 01-2013, stent 04-2013    . Diabetes mellitus     no meds   Past Surgical History  Procedure Laterality Date  . Cervical spine surgery  2002    plate  . Cholecystectomy  1985  . Coronary artery bypass graft N/A 01/21/2013    Procedure: Coronary Arery Bypass Grafting  Times Five Using Left Internal Mammary Artery and Right Saphenous Leg Vein Harvested Endoscopically;  Surgeon: Loreli Slot, MD;  Location: [facility] OR;  Service: Open Heart Surgery;  Laterality: N/A;  . Percutaneous coronary stent intervention (pci-s)  101/31/2014    DrHhARDING   Family History   Problem Relation Age of Onset  . Coronary artery disease Neg Hx   . Stroke Neg Hx   . Colon cancer Neg Hx   . Prostate cancer Neg Hx   . Atopy Neg Hx   . Asthma Neg Hx   . Hypertension Mother   . Hypertension Father   . Diabetes Father   . Cancer Father     colon  . Hypertension Brother   . Cancer Brother     lung  . Hypertension Sister   . Cancer Sister     lung  . Diabetes Brother   . Diabetes Sister   . Diabetes      Grandparents   . Lung cancer Brother   . Lung cancer Sister    History  Substance Use Topics  . Smoking status: Former Smoker -- 1.00 packs/day for 10 years    Types: Cigarettes    Quit date: 01/17/1977  . Smokeless tobacco: Never Used     Comment: Stopped at age 39   . Alcohol Use: No     Comment: seldom    Review of Systems  Gastrointestinal: Positive for constipation.   All other systems reviewed and are negative except as noted in HPI.   Allergies  Atorvastatin; Ezetimibe; Lovastatin; and Percocet  Home Medications   Current Outpatient Rx  Name  Route  Sig  Dispense  Refill  .  acetaminophen (TYLENOL) 325 MG tablet   Oral   Take 2 tablets (650 mg total) by mouth every 4 (four) hours as needed.         Marland Kitchen aspirin EC 81 MG tablet   Oral   Take 81 mg by mouth every morning.         . Cholecalciferol (VITAMIN D3) 2000 UNITS TABS   Oral   Take 2,000 Units by mouth daily.         . Coenzyme Q10 (COQ10) 100 MG CAPS   Oral   Take 100 mg by mouth daily.         . diazepam (VALIUM) 2 MG tablet   Oral   Take 2 mg by mouth at bedtime as needed for sleep.         . finasteride (PROSCAR) 5 MG tablet   Oral   Take 5 mg by mouth daily.         . isosorbide mononitrate (IMDUR) 60 MG 24 hr tablet   Oral   Take 60 mg by mouth daily.         Marland Kitchen KRILL OIL 1000 MG CAPS   Oral   Take 1,000 mg by mouth daily.          Marland Kitchen losartan (COZAAR) 50 MG tablet   Oral   Take 50 mg by mouth daily.         . metoprolol (LOPRESSOR) 50  MG tablet   Oral   Take 50 mg by mouth 2 (two) times daily.         . nitroGLYCERIN (NITROSTAT) 0.4 MG SL tablet   Sublingual   Place 0.4 mg under the tongue every 5 (five) minutes as needed for chest pain.         Marland Kitchen OVER THE COUNTER MEDICATION   Oral   Take 1 tablet by mouth daily. Macular degeneration vitamin.         . pravastatin (PRAVACHOL) 40 MG tablet   Oral   Take 40 mg by mouth every evening.         . ranolazine (RANEXA) 1000 MG SR tablet   Oral   Take 1 tablet (1,000 mg total) by mouth 2 (two) times daily.   60 tablet   11   . Ticagrelor (BRILINTA) 90 MG TABS tablet   Oral   Take 90 mg by mouth 2 (two) times daily.          BP 121/76  Pulse 73  Temp(Src) 97.4 F (36.3 C) (Oral)  Resp 18  SpO2 99% Physical Exam  Nursing note and vitals reviewed. Constitutional: He is oriented to person, place, and time. He appears well-developed and well-nourished.  HENT:  Head: Normocephalic and atraumatic.  Eyes: EOM are normal. Pupils are equal, round, and reactive to light.  Neck: Normal range of motion. Neck supple.  Cardiovascular: Normal rate, normal heart sounds and intact distal pulses.   Pulmonary/Chest: Effort normal and breath sounds normal.  Abdominal: Bowel sounds are normal. He exhibits no distension. There is no tenderness.  Genitourinary:  Large stool impaction, broken up digitally, no hemorrhoids  Musculoskeletal: Normal range of motion. He exhibits no edema and no tenderness.  Groin puncture site healing well without bruising or pulsatile mass  Neurological: He is alert and oriented to person, place, and time. He has normal strength. No cranial nerve deficit or sensory deficit.  Skin: Skin is warm and dry. No rash noted.  Psychiatric: He has a normal  mood and affect.    ED Course  Procedures (including critical care time) Labs Review Labs Reviewed - No data to display Imaging Review No results found.  EKG Interpretation   None        MDM   1. Fecal impaction     Will attempt enema after breaking up stool ball.  Pt with large bowel movement after mineral oil enema. Feeling better and ready to go.   Charles B. Bernette Mayers, MD 06/11/13 1234

## 2013-06-12 ENCOUNTER — Encounter: Payer: Self-pay | Admitting: Cardiovascular Disease

## 2013-06-14 ENCOUNTER — Ambulatory Visit (INDEPENDENT_AMBULATORY_CARE_PROVIDER_SITE_OTHER): Payer: Medicare Other | Admitting: Internal Medicine

## 2013-06-14 ENCOUNTER — Encounter: Payer: Self-pay | Admitting: Internal Medicine

## 2013-06-14 ENCOUNTER — Other Ambulatory Visit: Payer: Self-pay | Admitting: Cardiovascular Disease

## 2013-06-14 VITALS — BP 112/66 | HR 65 | Temp 97.7°F | Wt 180.0 lb

## 2013-06-14 DIAGNOSIS — F411 Generalized anxiety disorder: Secondary | ICD-10-CM

## 2013-06-14 DIAGNOSIS — K5900 Constipation, unspecified: Secondary | ICD-10-CM | POA: Insufficient documentation

## 2013-06-14 MED ORDER — LACTULOSE 20 GM/30ML PO SOLN: 15.0000 mL | Freq: Two times a day (BID) | ORAL | Status: DC | PRN

## 2013-06-14 NOTE — Assessment & Plan Note (Signed)
improving

## 2013-06-14 NOTE — Progress Notes (Signed)
  Subjective:    Patient ID: Charles [name], male    DOB: 1935/04/01, 78 y.o.   MRN: [id number]  HPI ER followup Went to the ER with rectal pain and blood in the stools around 30 cc. Notes reviewed, he was found to have a fecal impaction and was mechanically helped. Since then his  bowel movements are not normal, has small pieces of brown stools, they are soft. He is taking MiraLax and sometimes mineral oil In retrospect, the patient realized that the only new medication that he is taking is Ranexa and  thinks is probably the culprit of constipation (5% of people do develop constipation on  this medication) He was recently seen with anxiety and is actually feeling better.  Past Medical History  Diagnosis Date  . Hypertension   . Hyperlipidemia     diet controlled, statins and zetia intolerant  . Retinopathy     mild glaucomea  . Transient ischemic attack 1999  . Histoplasmosis   . Chronic cough 08/2009    -allergy profile- june 2,2011- neg. -Max GERD rx/ off oils January 07 2010 x 3 weeks only but no benefit. - Sinus CT rec July 11,2011- refused due to claustrophobia -MCT neg for asthma 03/01/10 -Add 1st Gen H1 July 11,2011- better  . Macular degeneration   . PONV (postoperative nausea and vomiting)     extreme nausea  . Coronary artery disease     CABG 01-2013, stent 04-2013    . Diabetes mellitus     no meds   Past Surgical History  Procedure Laterality Date  . Cervical spine surgery  2002    plate  . Cholecystectomy  1985  . Coronary artery bypass graft N/A 01/21/2013    Procedure: Coronary Arery Bypass Grafting  Times Five Using Left Internal Mammary Artery and Right Saphenous Leg Vein Harvested Endoscopically;  Surgeon: Loreli Slot, MD;  Location: [facility];  Service: Open Heart Surgery;  Laterality: N/A;  . Percutaneous coronary stent intervention (pci-s)  101/31/2014    DrHhARDING     Review of Systems Denies fever or chills. No nausea, vomiting, no further blood in the  stools. No abdominal pain. No CP or shortness of breath.     Objective:   Physical Exam  BP 112/66  Pulse 65  Temp(Src) 97.7 F (36.5 C)  Wt 180 lb (81.647 kg)  SpO2 100% General -- alert, well-developed, NAD.  Abdomen-- Not distended, good bowel sounds,soft, non-tender. No rebound or rigidity. No mass,organomegaly. Rectal-- No stools found, no blood, no impaction Extremities-- no pretibial edema bilaterally  Neurologic--  alert & oriented X3. Speech normal, gait normal, strength normal in all extremities.  Psych-- Cognition and judgment appear intact. Cooperative with normal attention span and concentration. No anxious appearing , no depressed appearing.     Assessment & Plan:

## 2013-06-14 NOTE — Assessment & Plan Note (Addendum)
Severe constipation, recently seen at the ER with fecal impaction On exam today there is no impaction. related to Ranexa?  See HPI Plan: Continue MiraLax daily Will add lactulose as needed, see prescription Use enemas as needed Recommend to call  cardiology if symptoms continue since they seem to be related to Ranexa (but don't self d/c the medicine)

## 2013-06-14 NOTE — Research (Signed)
Charles [name] is enrolled in the AVERT Research Study.  Per protocol he had Creatinine/GFR drawn daily for 5 days after his PCI on 06/07/2013.  The results of these labs are scanned in EPIC.  Claire Shown, RN, Study Coordinator

## 2013-06-14 NOTE — Patient Instructions (Signed)
MiraLax 17 g with fluids  every day Lactulose 15 mL twice a day as needed Fleet enema if symptoms severe Call anytime if blood in the stools, abdominal pain or nausea. If you continue with  constipation symptoms, call cardiology regards Ranexa

## 2013-06-14 NOTE — Progress Notes (Signed)
Pre visit review using our clinic review tool, if applicable. No additional management support is needed unless otherwise documented below in the visit note. 

## 2013-06-17 LAB — HM DIABETES EYE EXAM: HM Diabetic Eye Exam: NEGATIVE

## 2013-06-18 ENCOUNTER — Encounter: Payer: Self-pay | Admitting: Cardiovascular Disease

## 2013-06-18 ENCOUNTER — Ambulatory Visit (INDEPENDENT_AMBULATORY_CARE_PROVIDER_SITE_OTHER): Payer: Medicare Other | Admitting: Cardiovascular Disease

## 2013-06-18 VITALS — BP 128/70 | HR 63 | Ht 71.5 in | Wt 179.8 lb

## 2013-06-18 DIAGNOSIS — I25810 Atherosclerosis of coronary artery bypass graft(s) without angina pectoris: Secondary | ICD-10-CM

## 2013-06-18 NOTE — Patient Instructions (Signed)
Your physician has recommended you make the following change in your medication: stop Ranexa. Take the pravastatin 6 days a week only. Your physician recommends that you schedule a follow-up appointment in: 3 months

## 2013-06-18 NOTE — Assessment & Plan Note (Addendum)
Good symptomatic improvement following his last PCI. He needs to stay on dual antiplatelet therapy 2 next November. He continues to have some slight brief episodes of gasping shortness of breath secondary to the Brilinta. In about 6 months we could consider switching to clopidogrel. I'm not sure that Ranexa is the cause of his constipation but since he is now angina free I think it is perfectly fine to try stopping it altogether. I propose that we try taking pravastatin 6 days of the week with a one-day break to see if this helps prevent his muscle weakness. He has previously tried atorvastatin and lovastatin with even more severe side effects. Other options may be once weekly Crestor or low-dose Livalo. He did not tolerate zetia, either.

## 2013-06-18 NOTE — Progress Notes (Signed)
Patient ID: Charles [name], male   DOB: 1935-04-26, 78 y.o.   MRN: [id number]      Reason for office visit CAD, F/U after PCI  Charles [name] feels improved following his last revascularization procedure. He received a stent to the posterior descending artery. He has widely scattered diffuse disease typical of a patient with diabetes. Despite undergoing bypass surgery several months ago he continued to have angina pectoris and this persisted even after a stent was placed to the ramus intermedius artery. He now feels much better. He was able to climb the hill that has become a stented measure of his exertional angina, without symptoms.  He has residual disease in the posterolateral ventricular branch, difficult to approach secondary to the anatomy. Also has calcified stenosis in a relatively small the circumflex coronary artery. This is felt to difficult to treat since it will require rotablation, is a relatively small vessel with a couple of a 90 angles.  He has had severe constipation and even required disimpaction last week. He thinks the Ranexa as responsible for this. He has tried a variety of laxatives with limited results. Since he is angina free will discontinue the Ranexa at this time, with the option of restarting it may be at the 500 mg dose. He is interested in going back to cardiac rehabilitation again. He is greatly appreciative of the attention and care he received there.  As with previous statin therapy after taking it for about 4-6 weeks he begins developing lower extremity muscle weakness. Has found that this resolves if he skips the pravastatin for just 24 hours.  06/07/13 - DH - cath/PCI Widely patent recently placed stent in the ramus intermedius with no notable changes in the left coronary system.  Widely patent graft to the right system with diffuse disease in the proximal and mid/distal native RCA. Moderate to severe bifurcation disease of the distal RCA that involves the ostial  RPDA as well as the ostial and proximal RPAV with more severe downstream disease in the RPDA. Successful single site intervention on the distal RPDA with a Promus Premier DES 2.25 x 24 with excellent TIMI-3 flow now to a prominent PDA that reaches all the way to the apex.   Allergies  Allergen Reactions  . Atorvastatin     REACTION: myalgia  . Ezetimibe     REACTION: dizzy  . Lovastatin     REACTION: weakness. Patient able to tolerate pravastatin  . Percocet [Oxycodone-Acetaminophen] Other (See Comments)    hallucinations    Current Outpatient Prescriptions  Medication Sig Dispense Refill  . acetaminophen (TYLENOL) 325 MG tablet Take 2 tablets (650 mg total) by mouth every 4 (four) hours as needed.      Marland Kitchen aspirin EC 81 MG tablet Take 81 mg by mouth every morning.      . Cholecalciferol (VITAMIN D3) 2000 UNITS TABS Take 2,000 Units by mouth daily.      . Coenzyme Q10 (COQ10) 100 MG CAPS Take 100 mg by mouth daily.      . diazepam (VALIUM) 2 MG tablet Take 2 mg by mouth at bedtime as needed for sleep.      . finasteride (PROSCAR) 5 MG tablet Take 5 mg by mouth daily.      . isosorbide mononitrate (IMDUR) 60 MG 24 hr tablet Take 60 mg by mouth daily.      Marland Kitchen KRILL OIL 1000 MG CAPS Take 1,000 mg by mouth daily.       . Lactulose 20  GM/30ML SOLN Take 15 mLs (10 g total) by mouth 2 (two) times daily as needed.  240 mL  1  . losartan (COZAAR) 50 MG tablet Take 50 mg by mouth daily.      . metoprolol (LOPRESSOR) 50 MG tablet Take 50 mg by mouth 2 (two) times daily.      . nitroGLYCERIN (NITROSTAT) 0.4 MG SL tablet Place 0.4 mg under the tongue every 5 (five) minutes as needed for chest pain.      Marland Kitchen OVER THE COUNTER MEDICATION Take 1 tablet by mouth daily. Macular degeneration vitamin.      . polyethylene glycol powder (GLYCOLAX/MIRALAX) powder Take 17 g by mouth daily.  255 g  0  . pravastatin (PRAVACHOL) 40 MG tablet Take 40 mg by mouth every evening.      . ranolazine (RANEXA) 1000 MG SR  tablet Take 1 tablet (1,000 mg total) by mouth 2 (two) times daily.  60 tablet  11  . Ticagrelor (BRILINTA) 90 MG TABS tablet Take 90 mg by mouth 2 (two) times daily.      Marland Kitchen GENERLAC 10 GM/15ML SOLN as needed.       No current facility-administered medications for this visit.    Past Medical History  Diagnosis Date  . Hypertension   . Hyperlipidemia     diet controlled, statins and zetia intolerant  . Retinopathy     mild glaucomea  . Transient ischemic attack 1999  . Histoplasmosis   . Chronic cough 08/2009    -allergy profile- june 2,2011- neg. -Max GERD rx/ off oils January 07 2010 x 3 weeks only but no benefit. - Sinus CT rec July 11,2011- refused due to claustrophobia -MCT neg for asthma 03/01/10 -Add 1st Gen H1 July 11,2011- better  . Macular degeneration   . PONV (postoperative nausea and vomiting)     extreme nausea  . Coronary artery disease     CABG 01-2013, stent 04-2013    . Diabetes mellitus     no meds    Past Surgical History  Procedure Laterality Date  . Cervical spine surgery  2002    plate  . Cholecystectomy  1985  . Coronary artery bypass graft N/A 01/21/2013    Procedure: Coronary Arery Bypass Grafting  Times Five Using Left Internal Mammary Artery and Right Saphenous Leg Vein Harvested Endoscopically;  Surgeon: Loreli Slot, MD;  Location: [facility] OR;  Service: Open Heart Surgery;  Laterality: N/A;  . Percutaneous coronary stent intervention (pci-s)  101/31/2014    DrHhARDING    Family History  Problem Relation Age of Onset  . Coronary artery disease Neg Hx   . Stroke Neg Hx   . Colon cancer Neg Hx   . Prostate cancer Neg Hx   . Atopy Neg Hx   . Asthma Neg Hx   . Hypertension Mother   . Hypertension Father   . Diabetes Father   . Cancer Father     colon  . Hypertension Brother   . Cancer Brother     lung  . Hypertension Sister   . Cancer Sister     lung  . Diabetes Brother   . Diabetes Sister   . Diabetes      Grandparents   . Lung cancer  Brother   . Lung cancer Sister     History   Social History  . Marital Status: Married    Spouse Name: N/A    Number of Children: 2  . Years of  Education: N/A   Occupational History  . reitred    Social History Main Topics  . Smoking status: Former Smoker -- 1.00 packs/day for 10 years    Types: Cigarettes    Quit date: 01/17/1977  . Smokeless tobacco: Never Used     Comment: Stopped at age 13   . Alcohol Use: No     Comment: seldom  . Drug Use: No  . Sexual Activity: Not on file   Other Topics Concern  . Not on file   Social History Narrative  . No narrative on file    Review of systems: The patient specifically denies any chest pain at rest or with exertion, dyspnea at rest or with exertion, orthopnea, paroxysmal nocturnal dyspnea, syncope, palpitations, focal neurological deficits, intermittent claudication, lower extremity edema, unexplained weight gain, cough, hemoptysis or wheezing.  The patient also denies abdominal pain, nausea, vomiting, dysphagia, diarrhe, polyuria, polydipsia, dysuria, hematuria, frequency, urgency, abnormal bleeding or bruising, fever, chills, unexpected weight changes, mood swings, change in skin or hair texture, change in voice quality, auditory or visual problems, allergic reactions or rashes, new musculoskeletal complaints other than usual "aches and pains".   PHYSICAL EXAM BP 128/70  Pulse 63  Ht 5' 11.5" (1.816 m)  Wt 179 lb 12.8 oz (81.557 kg)  BMI 24.73 kg/m2  General: Alert, oriented x3, no distress Head: no evidence of trauma, PERRL, EOMI, no exophtalmos or lid lag, no myxedema, no xanthelasma; normal ears, nose and oropharynx Neck: normal jugular venous pulsations and no hepatojugular reflux; brisk carotid pulses without delay and no carotid bruits Chest: clear to auscultation, no signs of consolidation by percussion or palpation, normal fremitus, symmetrical and full respiratory excursions Cardiovascular: normal position and  quality of the apical impulse, regular rhythm, normal first and second heart sounds, no murmurs, rubs or gallops Abdomen: no tenderness or distention, no masses by palpation, no abnormal pulsatility or arterial bruits, normal bowel sounds, no hepatosplenomegaly Extremities: no clubbing, cyanosis or edema; 2+ radial, ulnar and brachial pulses bilaterally; 2+ right femoral, posterior tibial and dorsalis pedis pulses; 2+ left femoral, posterior tibial and dorsalis pedis pulses; no subclavian or femoral bruits Neurological: grossly nonfocal   EKG: Sinus rhythm, right bundle branch block, no ischemic changes  Lipid Panel     Component Value Date/Time   CHOL 162 05/23/2013 0809   TRIG 125 05/23/2013 0809   HDL 49 05/23/2013 0809   CHOLHDL 3.3 05/23/2013 0809   VLDL 25 05/23/2013 0809   LDLCALC 88 05/23/2013 0809    BMET    Component Value Date/Time   NA 143 06/08/2013 0639   K 4.2 06/08/2013 0639   CL 106 06/08/2013 0639   CO2 24 06/08/2013 0639   GLUCOSE 99 06/08/2013 0639   BUN 21 06/08/2013 0639   CREATININE 1.40* 06/08/2013 0639   CREATININE 1.44* 05/23/2013 0809   CALCIUM 9.3 06/08/2013 0639   GFRNONAA 47* 06/08/2013 0639   GFRAA 55* 06/08/2013 0639     ASSESSMENT AND PLAN CAD- S/P CABG x 5 (LIMA-LAD, SVG-RI-OM1, SVG to distal RCA, SVG-PDA) 01/21/13 Good symptomatic improvement following his last PCI. He needs to stay on dual antiplatelet therapy 2 next November. He continues to have some slight brief episodes of gasping shortness of breath secondary to the Brilinta. In about 6 months we could consider switching to clopidogrel. I'm not sure that Ranexa is the cause of his constipation but since he is now angina free I think it is perfectly fine to try stopping it  altogether. I propose that we try taking pravastatin 6 days of the week with a one-day break to see if this helps prevent his muscle weakness. He has previously tried atorvastatin and lovastatin with even more severe side  effects. Other options may be once weekly Crestor or low-dose Livalo. He did not tolerate zetia, either.    Patient Instructions  Your physician has recommended you make the following change in your medication: stop Ranexa. Take the pravastatin 6 days a week only. Your physician recommends that you schedule a follow-up appointment in: 3 months      Orders Placed This Encounter  Procedures  . EKG 12-Lead   Meds ordered this encounter  Medications  . GENERLAC 10 GM/15ML SOLN    Sig: as needed.    Junious Silk, MD, [facility] [phone] office [phone] pager

## 2013-06-19 ENCOUNTER — Telehealth: Payer: Self-pay | Admitting: *Deleted

## 2013-06-19 NOTE — Telephone Encounter (Signed)
Order for Phase II Cardiac Rehab faxed.

## 2013-06-21 ENCOUNTER — Encounter: Payer: Self-pay | Admitting: Internal Medicine

## 2013-06-24 ENCOUNTER — Ambulatory Visit: Payer: Medicare Other | Admitting: Cardiovascular Disease

## 2013-06-25 ENCOUNTER — Telehealth: Payer: Self-pay | Admitting: *Deleted

## 2013-06-25 NOTE — Telephone Encounter (Signed)
Signed order for Phase II Cardiac Rehab faxed in to [facility]

## 2013-06-27 ENCOUNTER — Encounter (HOSPITAL_COMMUNITY)
Admission: RE | Admit: 2013-06-27 | Discharge: 2013-06-27 | Disposition: A | Discharge status: Home / Self Care | Payer: Medicare Other | Source: Ambulatory Visit | Attending: Cardiovascular Disease | Admitting: Cardiovascular Disease

## 2013-06-27 DIAGNOSIS — Z8673 Personal history of transient ischemic attack (TIA), and cerebral infarction without residual deficits: Secondary | ICD-10-CM | POA: Insufficient documentation

## 2013-06-27 DIAGNOSIS — I2510 Atherosclerotic heart disease of native coronary artery without angina pectoris: Secondary | ICD-10-CM | POA: Insufficient documentation

## 2013-06-27 DIAGNOSIS — Z951 Presence of aortocoronary bypass graft: Secondary | ICD-10-CM | POA: Insufficient documentation

## 2013-06-27 DIAGNOSIS — Z5189 Encounter for other specified aftercare: Secondary | ICD-10-CM | POA: Insufficient documentation

## 2013-06-27 NOTE — Progress Notes (Signed)
Cardiac Rehab Medication Review by a Pharmacist  Does the patient  feel that his/her medications are working for him/her?  yes  Has the patient been experiencing any side effects to the medications prescribed?  no  Does the patient measure his/her own blood pressure or blood glucose at home?  yes   Does the patient have any problems obtaining medications due to transportation or finances?   Yes, sometimes his brilinta takes time before he can get it.  Understanding of regimen: excellent Understanding of indications: excellent Potential of compliance: excellent   Dolphus Jenny 06/27/2013 8:20 AM

## 2013-07-01 ENCOUNTER — Encounter (HOSPITAL_COMMUNITY): Payer: Medicare Other

## 2013-07-01 ENCOUNTER — Encounter (HOSPITAL_COMMUNITY)
Admission: RE | Admit: 2013-07-01 | Discharge: 2013-07-01 | Disposition: A | Discharge status: Home / Self Care | Payer: Medicare Other | Source: Ambulatory Visit | Attending: Cardiovascular Disease | Admitting: Cardiovascular Disease

## 2013-07-01 LAB — GLUCOSE, CAPILLARY: Glucose-Capillary: 104 mg/dL — ABNORMAL HIGH (ref 70–99)

## 2013-07-01 NOTE — Progress Notes (Signed)
Pt in today for his first day of exercise at the 1115 exercise class time.  Pt tolerated light exercise with no complaints.  Pt reports that he feel good and is positive about his future.  Monitor showed sr with tall t waves and no ectopy as noted. PHQ2 score 1.  Pt blood sugar checked prior to exercise 104, pt is not on any medications for DM but is diet controlled.  Post blood glucose 89.  Pt plans to eat lunch when he returns home.  Continue to monitor. Karlene Lineman RN BSN

## 2013-07-03 ENCOUNTER — Encounter (HOSPITAL_COMMUNITY): Payer: Medicare Other

## 2013-07-03 ENCOUNTER — Encounter (HOSPITAL_COMMUNITY)
Admission: RE | Admit: 2013-07-03 | Discharge: 2013-07-03 | Disposition: A | Discharge status: Home / Self Care | Payer: Medicare Other | Source: Ambulatory Visit | Attending: Cardiovascular Disease | Admitting: Cardiovascular Disease

## 2013-07-05 ENCOUNTER — Encounter (HOSPITAL_COMMUNITY): Payer: Medicare Other

## 2013-07-08 ENCOUNTER — Encounter (HOSPITAL_COMMUNITY): Payer: Medicare Other

## 2013-07-10 ENCOUNTER — Encounter (HOSPITAL_COMMUNITY)
Admission: RE | Admit: 2013-07-10 | Discharge: 2013-07-10 | Disposition: A | Discharge status: Home / Self Care | Payer: Medicare Other | Source: Ambulatory Visit | Attending: Cardiovascular Disease | Admitting: Cardiovascular Disease

## 2013-07-10 ENCOUNTER — Encounter (HOSPITAL_COMMUNITY): Payer: Medicare Other

## 2013-07-10 DIAGNOSIS — Z951 Presence of aortocoronary bypass graft: Secondary | ICD-10-CM | POA: Insufficient documentation

## 2013-07-10 DIAGNOSIS — Z8673 Personal history of transient ischemic attack (TIA), and cerebral infarction without residual deficits: Secondary | ICD-10-CM | POA: Insufficient documentation

## 2013-07-10 DIAGNOSIS — I2510 Atherosclerotic heart disease of native coronary artery without angina pectoris: Secondary | ICD-10-CM | POA: Insufficient documentation

## 2013-07-10 DIAGNOSIS — Z5189 Encounter for other specified aftercare: Secondary | ICD-10-CM | POA: Insufficient documentation

## 2013-07-10 NOTE — Progress Notes (Signed)
Reviewed home exercise with pt today.  Pt plans to continue walking at home for exercise.  Reviewed THR, pulse, RPE, sign and symptoms, NTG use, and when to call 911 or MD.  Pt voiced understanding. Daelan Gatt, MA, ACSM RCEP  

## 2013-07-12 ENCOUNTER — Encounter (HOSPITAL_COMMUNITY)
Admission: RE | Admit: 2013-07-12 | Discharge: 2013-07-12 | Disposition: A | Discharge status: Home / Self Care | Payer: Medicare Other | Source: Ambulatory Visit | Attending: Cardiovascular Disease | Admitting: Cardiovascular Disease

## 2013-07-12 ENCOUNTER — Encounter (HOSPITAL_COMMUNITY): Payer: Medicare Other

## 2013-07-15 ENCOUNTER — Encounter (HOSPITAL_COMMUNITY)
Admission: RE | Admit: 2013-07-15 | Discharge: 2013-07-15 | Disposition: A | Discharge status: Home / Self Care | Payer: Medicare Other | Source: Ambulatory Visit | Attending: Cardiovascular Disease | Admitting: Cardiovascular Disease

## 2013-07-15 ENCOUNTER — Encounter (HOSPITAL_COMMUNITY): Payer: Medicare Other

## 2013-07-17 ENCOUNTER — Encounter (HOSPITAL_COMMUNITY)
Admission: RE | Admit: 2013-07-17 | Discharge: 2013-07-17 | Disposition: A | Discharge status: Home / Self Care | Payer: Medicare Other | Source: Ambulatory Visit | Attending: Cardiovascular Disease | Admitting: Cardiovascular Disease

## 2013-07-17 ENCOUNTER — Encounter (HOSPITAL_COMMUNITY): Payer: Medicare Other

## 2013-07-19 ENCOUNTER — Encounter (HOSPITAL_COMMUNITY): Payer: Medicare Other

## 2013-07-19 ENCOUNTER — Encounter (HOSPITAL_COMMUNITY)
Admission: RE | Admit: 2013-07-19 | Discharge: 2013-07-19 | Disposition: A | Discharge status: Home / Self Care | Payer: Medicare Other | Source: Ambulatory Visit | Attending: Cardiovascular Disease | Admitting: Cardiovascular Disease

## 2013-07-22 ENCOUNTER — Encounter (HOSPITAL_COMMUNITY)
Admission: RE | Admit: 2013-07-22 | Discharge: 2013-07-22 | Disposition: A | Discharge status: Home / Self Care | Payer: Medicare Other | Source: Ambulatory Visit | Attending: Cardiovascular Disease | Admitting: Cardiovascular Disease

## 2013-07-22 ENCOUNTER — Encounter (HOSPITAL_COMMUNITY): Payer: Medicare Other

## 2013-07-24 ENCOUNTER — Encounter (HOSPITAL_COMMUNITY): Payer: Medicare Other

## 2013-07-24 ENCOUNTER — Encounter (HOSPITAL_COMMUNITY)
Admission: RE | Admit: 2013-07-24 | Discharge: 2013-07-24 | Disposition: A | Discharge status: Home / Self Care | Payer: Medicare Other | Source: Ambulatory Visit | Attending: Cardiovascular Disease | Admitting: Cardiovascular Disease

## 2013-07-26 ENCOUNTER — Encounter (HOSPITAL_COMMUNITY): Payer: Medicare Other

## 2013-07-26 ENCOUNTER — Encounter (HOSPITAL_COMMUNITY)
Admission: RE | Admit: 2013-07-26 | Discharge: 2013-07-26 | Disposition: A | Discharge status: Home / Self Care | Payer: Medicare Other | Source: Ambulatory Visit | Attending: Cardiovascular Disease | Admitting: Cardiovascular Disease

## 2013-07-29 ENCOUNTER — Encounter (HOSPITAL_COMMUNITY): Payer: Self-pay

## 2013-07-29 ENCOUNTER — Encounter (HOSPITAL_COMMUNITY): Payer: Medicare Other

## 2013-07-29 ENCOUNTER — Encounter (HOSPITAL_COMMUNITY)
Admission: RE | Admit: 2013-07-29 | Discharge: 2013-07-29 | Disposition: A | Discharge status: Home / Self Care | Payer: Medicare Other | Source: Ambulatory Visit | Attending: Cardiovascular Disease | Admitting: Cardiovascular Disease

## 2013-07-29 NOTE — Progress Notes (Signed)
Charles [name] 78 y.o. male Nutrition Note Spoke with pt.  Nutrition Plan and Nutrition Survey goals reviewed with pt. Pt is following Step 2 of the Therapeutic Lifestyle Changes diet. Pt wants to lose wt. Pt has not been actively trying to lose wt. Wt loss tips reviewed. Pt is a diet-controlled diabetic. Last A1c indicates blood glucose well-controlled. Pt checks his CBG's infrequently. Per pt, "I check my blood sugar if I'm going to eat something or think I've eaten something that may cause my blood sugar to go too high." This writer went over Diabetes Education test results. Pt expressed understanding of the information reviewed. Pt aware of nutrition education classes offered.  Nutrition Diagnosis   Food-and nutrition-related knowledge deficit related to lack of exposure to information as related to diagnosis of: ? CVD ? DM (A1c 6.5)   Overweight related to excessive energy intake as evidenced by a BMI of 25.9  Nutrition RX/ Estimated Daily Nutrition Needs for: wt loss  1350-1850 Kcal, 35-50 gm fat, 9-12 gm sat fat, 1.3-1.9 gm trans-fat, <1500 mg sodium, 175-250 gm CHO   Nutrition Intervention   Pt's individual nutrition plan reviewed with pt.   Benefits of adopting Therapeutic Lifestyle Changes discussed when Medficts reviewed.   Pt to attend the Portion Distortion class   Pt to attend the  ? Nutrition I class                     ? Nutrition II class        ? Diabetes Blitz class       ? Diabetes Q & A class - met 07/26/13   Pt given handouts for: ? Nutrition I class ? Nutrition II class ? Diabetes Blitz class   Continue client-centered nutrition education by RD, as part of interdisciplinary care. Goal(s)   Pt to identify food quantities necessary to achieve: ? wt loss to a goal wt of 158-176 lb (71.6-79.8 kg) at graduation from cardiac rehab.  Monitor and Evaluate progress toward nutrition goal with team. Nutrition Risk: Change to Moderate Mickle Plumb, M.Ed, RD, LDN, CDE 07/29/2013  12:03 PM

## 2013-07-29 NOTE — Progress Notes (Signed)
Pt elected to graduate early from the cardiac rehab phase II program with the additional completion of   Sessions.  Pt planning to travel over the holiday time and insurance benefits will change with the 2015 insurance plan year.  Pt plans to continue home exercise on his own with walking and gym equipment in his home.  Pt with renewed outlook on life and will be successful in this.  Medication list reconciled.  Repeat PHQ2 score 0.

## 2013-07-31 ENCOUNTER — Encounter (HOSPITAL_COMMUNITY): Payer: Medicare Other

## 2013-08-02 ENCOUNTER — Encounter (HOSPITAL_COMMUNITY): Payer: Medicare Other

## 2013-08-05 ENCOUNTER — Encounter (HOSPITAL_COMMUNITY): Payer: Medicare Other

## 2013-08-06 ENCOUNTER — Observation Stay (HOSPITAL_COMMUNITY)
Admission: EM | Admit: 2013-08-06 | Discharge: 2013-08-07 | Disposition: A | Discharge status: Home / Self Care | Payer: Medicare Other | Attending: Internal Medicine | Admitting: Internal Medicine

## 2013-08-06 ENCOUNTER — Emergency Department (HOSPITAL_COMMUNITY): Payer: Medicare Other

## 2013-08-06 ENCOUNTER — Encounter (HOSPITAL_COMMUNITY): Payer: Self-pay | Admitting: Radiology

## 2013-08-06 DIAGNOSIS — I129 Hypertensive chronic kidney disease with stage 1 through stage 4 chronic kidney disease, or unspecified chronic kidney disease: Secondary | ICD-10-CM | POA: Insufficient documentation

## 2013-08-06 DIAGNOSIS — I4510 Unspecified right bundle-branch block: Secondary | ICD-10-CM

## 2013-08-06 DIAGNOSIS — F411 Generalized anxiety disorder: Secondary | ICD-10-CM

## 2013-08-06 DIAGNOSIS — R931 Abnormal findings on diagnostic imaging of heart and coronary circulation: Secondary | ICD-10-CM

## 2013-08-06 DIAGNOSIS — R5383 Other fatigue: Secondary | ICD-10-CM

## 2013-08-06 DIAGNOSIS — R4789 Other speech disturbances: Secondary | ICD-10-CM | POA: Insufficient documentation

## 2013-08-06 DIAGNOSIS — E119 Type 2 diabetes mellitus without complications: Secondary | ICD-10-CM | POA: Diagnosis present

## 2013-08-06 DIAGNOSIS — K5900 Constipation, unspecified: Secondary | ICD-10-CM

## 2013-08-06 DIAGNOSIS — I2510 Atherosclerotic heart disease of native coronary artery without angina pectoris: Secondary | ICD-10-CM | POA: Insufficient documentation

## 2013-08-06 DIAGNOSIS — Z7982 Long term (current) use of aspirin: Secondary | ICD-10-CM | POA: Insufficient documentation

## 2013-08-06 DIAGNOSIS — I10 Essential (primary) hypertension: Secondary | ICD-10-CM | POA: Diagnosis present

## 2013-08-06 DIAGNOSIS — B351 Tinea unguium: Secondary | ICD-10-CM

## 2013-08-06 DIAGNOSIS — Z87891 Personal history of nicotine dependence: Secondary | ICD-10-CM | POA: Insufficient documentation

## 2013-08-06 DIAGNOSIS — H409 Unspecified glaucoma: Secondary | ICD-10-CM

## 2013-08-06 DIAGNOSIS — N183 Chronic kidney disease, stage 3 unspecified: Secondary | ICD-10-CM | POA: Diagnosis present

## 2013-08-06 DIAGNOSIS — Z951 Presence of aortocoronary bypass graft: Secondary | ICD-10-CM | POA: Insufficient documentation

## 2013-08-06 DIAGNOSIS — Z8673 Personal history of transient ischemic attack (TIA), and cerebral infarction without residual deficits: Secondary | ICD-10-CM

## 2013-08-06 DIAGNOSIS — N189 Chronic kidney disease, unspecified: Secondary | ICD-10-CM

## 2013-08-06 DIAGNOSIS — E785 Hyperlipidemia, unspecified: Secondary | ICD-10-CM | POA: Diagnosis present

## 2013-08-06 DIAGNOSIS — I209 Angina pectoris, unspecified: Secondary | ICD-10-CM | POA: Diagnosis present

## 2013-08-06 DIAGNOSIS — F519 Sleep disorder not due to a substance or known physiological condition, unspecified: Secondary | ICD-10-CM

## 2013-08-06 DIAGNOSIS — Z955 Presence of coronary angioplasty implant and graft: Secondary | ICD-10-CM

## 2013-08-06 DIAGNOSIS — Z0000 Encounter for general adult medical examination without abnormal findings: Secondary | ICD-10-CM

## 2013-08-06 DIAGNOSIS — I639 Cerebral infarction, unspecified: Secondary | ICD-10-CM | POA: Insufficient documentation

## 2013-08-06 DIAGNOSIS — I6350 Cerebral infarction due to unspecified occlusion or stenosis of unspecified cerebral artery: Secondary | ICD-10-CM

## 2013-08-06 DIAGNOSIS — I25810 Atherosclerosis of coronary artery bypass graft(s) without angina pectoris: Secondary | ICD-10-CM | POA: Diagnosis present

## 2013-08-06 DIAGNOSIS — G459 Transient cerebral ischemic attack, unspecified: Principal | ICD-10-CM | POA: Diagnosis present

## 2013-08-06 DIAGNOSIS — R4701 Aphasia: Secondary | ICD-10-CM | POA: Insufficient documentation

## 2013-08-06 LAB — COMPREHENSIVE METABOLIC PANEL
ALT: 18 U/L (ref 0–53)
AST: 20 U/L (ref 0–37)
Alkaline Phosphatase: 64 U/L (ref 39–117)
BUN: 32 mg/dL — ABNORMAL HIGH (ref 6–23)
CO2: 23 mEq/L (ref 19–32)
Calcium: 9.4 mg/dL (ref 8.4–10.5)
GFR calc Af Amer: 56 mL/min — ABNORMAL LOW (ref >90)
GFR calc non Af Amer: 48 mL/min — ABNORMAL LOW (ref >90)
Glucose, Bld: 84 mg/dL (ref 70–99)
Potassium: 4.5 mEq/L (ref 3.7–5.3)
Sodium: 143 mEq/L (ref 137–147)
Total Bilirubin: 0.5 mg/dL (ref 0.3–1.2)

## 2013-08-06 LAB — RAPID URINE DRUG SCREEN, HOSP PERFORMED
Barbiturates: NOT DETECTED
Cocaine: NOT DETECTED
Tetrahydrocannabinol: NOT DETECTED

## 2013-08-06 LAB — POCT I-STAT, CHEM 8
BUN: 33 mg/dL — ABNORMAL HIGH (ref 6–23)
Calcium, Ion: 1.24 mmol/L (ref 1.13–1.30)
Chloride: 109 mEq/L (ref 96–112)
Creatinine, Ser: 1.5 mg/dL — ABNORMAL HIGH (ref 0.50–1.35)
Glucose, Bld: 94 mg/dL (ref 70–99)
HCT: 45 % (ref 39.0–52.0)
Hemoglobin: 15.3 g/dL (ref 13.0–17.0)
Potassium: 4.2 mEq/L (ref 3.7–5.3)

## 2013-08-06 LAB — URINALYSIS, ROUTINE W REFLEX MICROSCOPIC
Leukocytes, UA: NEGATIVE
Nitrite: NEGATIVE
Specific Gravity, Urine: 1.029 (ref 1.005–1.030)
pH: 5.5 (ref 5.0–8.0)

## 2013-08-06 LAB — DIFFERENTIAL
Eosinophils Absolute: 0.2 10*3/uL (ref 0.0–0.7)
Eosinophils Relative: 3 % (ref 0–5)
Lymphocytes Relative: 34 % (ref 12–46)
Lymphs Abs: 2.8 10*3/uL (ref 0.7–4.0)
Monocytes Relative: 9 % (ref 3–12)
Neutro Abs: 4.3 10*3/uL (ref 1.7–7.7)

## 2013-08-06 LAB — PROTIME-INR
INR: 1.08 (ref 0.00–1.49)
Prothrombin Time: 13.8 seconds (ref 11.6–15.2)

## 2013-08-06 LAB — GLUCOSE, CAPILLARY: Glucose-Capillary: 96 mg/dL (ref 70–99)

## 2013-08-06 LAB — CBC
HCT: 44.3 % (ref 39.0–52.0)
Hemoglobin: 14.7 g/dL (ref 13.0–17.0)
MCH: 31.1 pg (ref 26.0–34.0)
MCHC: 33.2 g/dL (ref 30.0–36.0)
MCV: 93.7 fL (ref 78.0–100.0)
Platelets: 226 10*3/uL (ref 150–400)
RBC: 4.73 MIL/uL (ref 4.22–5.81)
WBC: 8.2 10*3/uL (ref 4.0–10.5)

## 2013-08-06 MED ORDER — NITROGLYCERIN 0.4 MG SL SUBL: 0.4000 mg | SUBLINGUAL_TABLET | SUBLINGUAL | Status: DC | PRN

## 2013-08-06 MED ORDER — OCUVITE PO TABS
1.0000 | ORAL_TABLET | Freq: Every day | ORAL | Status: DC
  Administered 2013-08-07: 1 via ORAL
  Filled 2013-08-06: qty 1

## 2013-08-06 MED ORDER — FINASTERIDE 5 MG PO TABS
5.0000 mg | ORAL_TABLET | Freq: Every morning | ORAL | Status: DC
  Administered 2013-08-07: 5 mg via ORAL
  Filled 2013-08-06: qty 1

## 2013-08-06 MED ORDER — COQ10 100 MG PO CAPS: 100.0000 mg | ORAL_CAPSULE | Freq: Every day | ORAL | Status: DC

## 2013-08-06 MED ORDER — ZOLPIDEM TARTRATE 5 MG PO TABS: 5.0000 mg | ORAL_TABLET | Freq: Every evening | ORAL | Status: DC | PRN

## 2013-08-06 MED ORDER — ISOSORBIDE MONONITRATE ER 60 MG PO TB24
60.0000 mg | ORAL_TABLET | Freq: Every day | ORAL | Status: DC
  Administered 2013-08-07: 60 mg via ORAL
  Filled 2013-08-06: qty 1

## 2013-08-06 MED ORDER — INSULIN ASPART 100 UNIT/ML ~~LOC~~ SOLN: 0.0000 [IU] | Freq: Three times a day (TID) | SUBCUTANEOUS | Status: DC

## 2013-08-06 MED ORDER — VITAMIN D3 25 MCG (1000 UNIT) PO TABS
2000.0000 [IU] | ORAL_TABLET | Freq: Every day | ORAL | Status: DC
  Administered 2013-08-07: 2000 [IU] via ORAL
  Filled 2013-08-06: qty 2

## 2013-08-06 MED ORDER — HEPARIN SODIUM (PORCINE) 5000 UNIT/ML IJ SOLN
5000.0000 [IU] | Freq: Three times a day (TID) | INTRAMUSCULAR | Status: DC
  Filled 2013-08-06 (×5): qty 1

## 2013-08-06 MED ORDER — POLYETHYLENE GLYCOL 3350 17 G PO PACK
17.0000 g | PACK | Freq: Every day | ORAL | Status: DC | PRN
  Filled 2013-08-06: qty 1

## 2013-08-06 MED ORDER — KRILL OIL 1000 MG PO CAPS: 1000.0000 mg | ORAL_CAPSULE | Freq: Every day | ORAL | Status: DC

## 2013-08-06 MED ORDER — TRAMADOL HCL 50 MG PO TABS
50.0000 mg | ORAL_TABLET | Freq: Once | ORAL | Status: AC
  Administered 2013-08-06: 50 mg via ORAL
  Filled 2013-08-06: qty 1

## 2013-08-06 MED ORDER — METOPROLOL TARTRATE 50 MG PO TABS
50.0000 mg | ORAL_TABLET | Freq: Two times a day (BID) | ORAL | Status: DC
  Administered 2013-08-06 – 2013-08-07 (×2): 50 mg via ORAL
  Filled 2013-08-06 (×3): qty 1

## 2013-08-06 MED ORDER — SIMVASTATIN 20 MG PO TABS
20.0000 mg | ORAL_TABLET | Freq: Every day | ORAL | Status: DC
  Administered 2013-08-07: 20 mg via ORAL
  Filled 2013-08-06: qty 1

## 2013-08-06 MED ORDER — ASPIRIN EC 81 MG PO TBEC
81.0000 mg | DELAYED_RELEASE_TABLET | Freq: Every morning | ORAL | Status: DC
Start: 2013-08-07 — End: 2013-08-07
  Administered 2013-08-07: 81 mg via ORAL
  Filled 2013-08-06: qty 1

## 2013-08-06 MED ORDER — LOSARTAN POTASSIUM 50 MG PO TABS
50.0000 mg | ORAL_TABLET | Freq: Every day | ORAL | Status: DC
  Administered 2013-08-07: 50 mg via ORAL
  Filled 2013-08-06: qty 1

## 2013-08-06 MED ORDER — DIPHENHYDRAMINE-APAP (SLEEP) 25-500 MG PO TABS: 1.0000 | ORAL_TABLET | Freq: Every evening | ORAL | Status: DC | PRN

## 2013-08-06 MED ORDER — TICAGRELOR 90 MG PO TABS
90.0000 mg | ORAL_TABLET | Freq: Two times a day (BID) | ORAL | Status: DC
  Administered 2013-08-06 – 2013-08-07 (×2): 90 mg via ORAL
  Filled 2013-08-06 (×3): qty 1

## 2013-08-06 NOTE — ED Provider Notes (Signed)
CSN: [id number]     Arrival date & time 08/06/13  1850 History   First MD Initiated Contact with Patient 08/06/13 1857     Chief Complaint  Patient presents with  . Code Stroke   (Consider location/radiation/quality/duration/timing/severity/associated sxs/prior Treatment) HPI Comments: Procedure brought to emergency department via ambulance for evaluation of stroke symptoms. Patient had onset of headache at approximately 3:30 today. The patient then had sudden onset of slurred speech with right arm numbness, tingling and weakness at 5:45 PM. Patient brought by EMS who report that he did have slurred speech initially, but symptoms are improving. Patient reports that he is able to speak more clearly now and his weakness in his right arm is resolving. Headache is moderate, 5/10.  She also reports that he did have chest pain yesterday. He is currently in cardiac rehabilitation after having bypass surgery followed by stenting. He is not currently experiencing any chest pain.   Past Medical History  Diagnosis Date  . Hypertension   . Hyperlipidemia     diet controlled, statins and zetia intolerant  . Retinopathy     mild glaucomea  . Transient ischemic attack 1999  . Histoplasmosis   . Chronic cough 08/2009    -allergy profile- june 2,2011- neg. -Max GERD rx/ off oils January 07 2010 x 3 weeks only but no benefit. - Sinus CT rec July 11,2011- refused due to claustrophobia -MCT neg for asthma 03/01/10 -Add 1st Gen H1 July 11,2011- better  . Macular degeneration   . PONV (postoperative nausea and vomiting)     extreme nausea  . Coronary artery disease     CABG 01-2013, stent 04-2013    . Diabetes mellitus     no meds   Past Surgical History  Procedure Laterality Date  . Cervical spine surgery  2002    plate  . Cholecystectomy  1985  . Coronary artery bypass graft N/A 01/21/2013    Procedure: Coronary Arery Bypass Grafting  Times Five Using Left Internal Mammary Artery and Right Saphenous Leg  Vein Harvested Endoscopically;  Surgeon: Loreli Slot, MD;  Location: [facility] OR;  Service: Open Heart Surgery;  Laterality: N/A;  . Percutaneous coronary stent intervention (pci-s)  101/31/2014    DrHhARDING   Family History  Problem Relation Age of Onset  . Coronary artery disease Neg Hx   . Stroke Neg Hx   . Colon cancer Neg Hx   . Prostate cancer Neg Hx   . Atopy Neg Hx   . Asthma Neg Hx   . Hypertension Mother   . Hypertension Father   . Diabetes Father   . Cancer Father     colon  . Hypertension Brother   . Cancer Brother     lung  . Hypertension Sister   . Cancer Sister     lung  . Diabetes Brother   . Diabetes Sister   . Diabetes      Grandparents   . Lung cancer Brother   . Lung cancer Sister    History  Substance Use Topics  . Smoking status: Former Smoker -- 1.00 packs/day for 10 years    Types: Cigarettes    Quit date: 01/17/1977  . Smokeless tobacco: Never Used     Comment: Stopped at age 70   . Alcohol Use: No     Comment: seldom    Review of Systems  Cardiovascular: Positive for chest pain.  Neurological: Positive for weakness, numbness and headaches.  All other systems reviewed  and are negative.    Allergies  Atorvastatin; Ezetimibe; Lovastatin; and Percocet  Home Medications   Current Outpatient Rx  Name  Route  Sig  Dispense  Refill  . acetaminophen (TYLENOL) 325 MG tablet   Oral   Take 2 tablets (650 mg total) by mouth every 4 (four) hours as needed.         Marland Kitchen aspirin EC 81 MG tablet   Oral   Take 81 mg by mouth every morning.         . Cholecalciferol (VITAMIN D3) 2000 UNITS TABS   Oral   Take 2,000 Units by mouth daily.         . Coenzyme Q10 (COQ10) 100 MG CAPS   Oral   Take 100 mg by mouth daily.         . diazepam (VALIUM) 2 MG tablet   Oral   Take 2 mg by mouth at bedtime as needed for sleep.         . finasteride (PROSCAR) 5 MG tablet   Oral   Take 5 mg by mouth daily.         . isosorbide  mononitrate (IMDUR) 60 MG 24 hr tablet   Oral   Take 60 mg by mouth daily.         Marland Kitchen KRILL OIL 1000 MG CAPS   Oral   Take 1,000 mg by mouth daily.          Marland Kitchen losartan (COZAAR) 50 MG tablet   Oral   Take 50 mg by mouth daily.         . metoprolol (LOPRESSOR) 50 MG tablet   Oral   Take 50 mg by mouth 2 (two) times daily.         . nitroGLYCERIN (NITROSTAT) 0.4 MG SL tablet   Sublingual   Place 0.4 mg under the tongue every 5 (five) minutes as needed for chest pain.         Marland Kitchen OVER THE COUNTER MEDICATION   Oral   Take 1 tablet by mouth daily. Macular degeneration vitamin.         . polyethylene glycol powder (GLYCOLAX/MIRALAX) powder   Oral   Take 17 g by mouth daily.   255 g   0   . pravastatin (PRAVACHOL) 40 MG tablet   Oral   Take 40 mg by mouth every evening.         . Ticagrelor (BRILINTA) 90 MG TABS tablet   Oral   Take 90 mg by mouth 2 (two) times daily.          BP 179/71  Pulse 60  Resp 16  SpO2 100% Physical Exam  Constitutional: He is oriented to person, place, and time. He appears well-developed and well-nourished. No distress.  HENT:  Head: Normocephalic and atraumatic.  Right Ear: Hearing normal.  Left Ear: Hearing normal.  Nose: Nose normal.  Mouth/Throat: Oropharynx is clear and moist and mucous membranes are normal.  Eyes: Conjunctivae and EOM are normal. Pupils are equal, round, and reactive to light.  Neck: Normal range of motion. Neck supple.  Cardiovascular: Regular rhythm, S1 normal and S2 normal.  Exam reveals no gallop and no friction rub.   No murmur heard. Pulmonary/Chest: Effort normal and breath sounds normal. No respiratory distress. He exhibits no tenderness.  Abdominal: Soft. Normal appearance and bowel sounds are normal. There is no hepatosplenomegaly. There is no tenderness. There is no rebound, no guarding, no tenderness at  McBurney's point and negative Murphy's sign. No hernia.  Musculoskeletal: Normal range of  motion.  Neurological: He is alert and oriented to person, place, and time. He has normal strength. No cranial nerve deficit or sensory deficit. Coordination normal. GCS eye subscore is 4. GCS verbal subscore is 5. GCS motor subscore is 6.  No obvious facial asymmetry, although patient does have slight dysarthria  Skin: Skin is warm, dry and intact. No rash noted. No cyanosis.  Psychiatric: He has a normal mood and affect. His speech is normal and behavior is normal. Thought content normal.    ED Course  Procedures (including critical care time) Labs Review Labs Reviewed  POCT I-STAT, CHEM 8 - Abnormal; Notable for the following:    BUN 33 (*)    Creatinine, Ser 1.50 (*)    All other components within normal limits  GLUCOSE, CAPILLARY  ETHANOL  PROTIME-INR  APTT  CBC  DIFFERENTIAL  COMPREHENSIVE METABOLIC PANEL  TROPONIN I  URINE RAPID DRUG SCREEN (HOSP PERFORMED)  URINALYSIS, ROUTINE W REFLEX MICROSCOPIC  POCT I-STAT TROPONIN I   Imaging Review Ct Head Wo Contrast  08/06/2013   EXAM: CT HEAD WITHOUT CONTRAST  TECHNIQUE: Contiguous axial images were obtained from the base of the skull through the vertex without intravenous contrast.  COMPARISON:  None.  FINDINGS: No acute intracranial hemorrhage. No focal mass lesion. No CT evidence of acute infarction. No midline shift or mass effect. No hydrocephalus. Basilar cisterns are patent.  There is a lacune infarction within the right basal ganglia. Mild generalized cortical atrophy.  Paranasal sinuses and  mastoid air cells are clear.  IMPRESSION: 1. No acute intracranial hemorrhage. 2. No CT evidence of acute infarction. 3. Remote lacunar infarction in the  right basal ganglia. 4. Atrophy. Findings conveyed toDr.  Roseanne Reno On 08/06/2013  at19:11.   Electronically Signed   By: Genevive Bi M.D.   On: 08/06/2013 19:13    EKG Interpretation    Date/Time:  Tuesday August 06 2013 19:10:12 EST Ventricular Rate:  60 PR  Interval:  189 QRS Duration: 143 QT Interval:  455 QTC Calculation: 455 R Axis:   -150 Text Interpretation:  Sinus rhythm IVCD, consider atypical RBBB Probable lateral infarct, age indeterminate No significant change since last tracing Confirmed by Evalin Shawhan  MD, Navika Hoopes ([id number]) on 08/06/2013 7:48:40 PM            MDM   1. CVA (cerebral infarction)   2. Type II or unspecified type diabetes mellitus without mention of complication, not stated as uncontrolled   3. Unspecified essential hypertension    Patient presents to the ER for evaluation of stroke symptoms. He was called a code stroke in the field and was evaluated in conjunction with neurology. Patient had sudden onset of right-sided numbness, tingling or weakness of the arm with dysarthria. His symptoms were improving on arrival. Based on his improving symptoms as well as the minor nature of his symptoms, he was not a candidate for thrombolytics. Initial CT was unremarkable. During the period of evaluation here in the ER, patient continues to improve. Repeat examination reveals possibly slight speech change, otherwise his other symptoms have resolved. It was recommended by neurology that the patient be admitted for MRI in the morning and carotid ultrasounds. Patient to be admitted by hospitalist service.    Gilda Crease, MD 08/06/13 2130

## 2013-08-06 NOTE — ED Notes (Signed)
Report to [facility] on 4N.  Pt to floor with EDT on heart monitor.

## 2013-08-06 NOTE — ED Notes (Signed)
Attempted to call report. RN not assigned to room yet.  Will call back in five minutes as requested.

## 2013-08-06 NOTE — Consult Note (Signed)
Referring Physician: Dr. Blinda Leatherwood    Chief Complaint: Acute onset of speech difficulty and numbness involving right hand and face.  HPI: Charles [name] is an 78 y.o. 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 Onset of symptoms was 1745 today. Patient has been taking aspirin and Brilinta. CT scan of his head showed no acute intracranial abnormality. Old right basal ganglia stroke was noted. Patient's speech abnormality significantly improved and and numbness resolved by the time he arrived in the emergency room. NIH stroke score was 3.  LSN: [id number] on 08/06/2013 tPA Given: No: Rapidly resolving deficits MRankin: 1  Past Medical History  Diagnosis Date  . Hypertension   . Hyperlipidemia     diet controlled, statins and zetia intolerant  . Retinopathy     mild glaucomea  . Transient ischemic attack 1999  . Histoplasmosis   . Chronic cough 08/2009    -allergy profile- june 2,2011- neg. -Max GERD rx/ off oils January 07 2010 x 3 weeks only but no benefit. - Sinus CT rec July 11,2011- refused due to claustrophobia -MCT neg for asthma 03/01/10 -Add 1st Gen H1 July 11,2011- better  . Macular degeneration   . PONV (postoperative nausea and vomiting)     extreme nausea  . Coronary artery disease     CABG 01-2013, stent 04-2013    . Diabetes mellitus     no meds    Family History  Problem Relation Age of Onset  . Coronary artery disease Neg Hx   . Stroke Neg Hx   . Colon cancer Neg Hx   . Prostate cancer Neg Hx   . Atopy Neg Hx   . Asthma Neg Hx   . Hypertension Mother   . Hypertension Father   . Diabetes Father   . Cancer Father     colon  . Hypertension Brother   . Cancer Brother     lung  . Hypertension Sister   . Cancer Sister     lung  . Diabetes Brother   . Diabetes  Sister   . Diabetes      Grandparents   . Lung cancer Brother   . Lung cancer Sister      Medications: I have reviewed the patient's current medications.  ROS: History obtained from spouse and the patient  General ROS: negative for - chills, fatigue, fever, night sweats, weight gain or weight loss Psychological ROS: negative for - behavioral disorder, hallucinations, memory difficulties, mood swings or suicidal ideation Ophthalmic ROS: negative for - blurry vision, double vision, eye pain or loss of vision ENT ROS: negative for - epistaxis, nasal discharge, oral lesions, sore throat, tinnitus or vertigo Allergy and Immunology ROS: negative for - hives or itchy/watery eyes Hematological and Lymphatic ROS: negative for - bleeding problems, bruising or swollen lymph nodes Endocrine ROS: negative for - galactorrhea, hair pattern changes, polydipsia/polyuria or temperature intolerance Respiratory ROS: negative for - cough, hemoptysis, shortness of breath or wheezing Cardiovascular ROS: negative for - chest pain, dyspnea on exertion, edema or irregular heartbeat Gastrointestinal ROS: negative for - abdominal pain, diarrhea, hematemesis, nausea/vomiting or stool incontinence Genito-Urinary ROS: negative for - dysuria, hematuria, incontinence or urinary frequency/urgency Musculoskeletal ROS: negative for - joint swelling or muscular weakness Neurological ROS: as noted in HPI Dermatological ROS: negative for rash and skin lesion  changes  Physical Examination: Blood pressure 179/73, pulse 59, temperature 97.5 F (36.4 C), resp. rate 15, SpO2 98.00%.  Neurologic Examination: Mental Status: Alert, oriented, thought content appropriate.  Speech fluent without evidence of aphasia. Able to follow commands without difficulty. Cranial Nerves: II-right upper visual field defect to finger counting. III/IV/VI-Pupils were equal and reacted. Extraocular movements were full and conjugate.     V/VII-slight left facial numbness; no facial weakness. VIII-normal. X-mild dysarthria; symmetrical palatal movement. Motor: 5/5 bilaterally with normal tone and bulk Sensory: Reduced perception of tactile sensation over left upper and lower extremities compared to right extremities. Deep Tendon Reflexes: 1+ and symmetric. Plantars: Mute bilaterally Cerebellar: Normal finger-to-nose testing. Carotid auscultation: Normal   Ct Head Wo Contrast  08/06/2013   EXAM: CT HEAD WITHOUT CONTRAST  TECHNIQUE: Contiguous axial images were obtained from the base of the skull through the vertex without intravenous contrast.  COMPARISON:  None.  FINDINGS: No acute intracranial hemorrhage. No focal mass lesion. No CT evidence of acute infarction. No midline shift or mass effect. No hydrocephalus. Basilar cisterns are patent.  There is a lacune infarction within the right basal ganglia. Mild generalized cortical atrophy.  Paranasal sinuses and  mastoid air cells are clear.  IMPRESSION: 1. No acute intracranial hemorrhage. 2. No CT evidence of acute infarction. 3. Remote lacunar infarction in the  right basal ganglia. 4. Atrophy. Findings conveyed toDr.  Roseanne Reno On 08/06/2013  at19:11.   Electronically Signed   By: Genevive Bi M.D.   On: 08/06/2013 19:13    Assessment: 78 y.o. male with multiple risk factors for stroke as well as previous subcortical basal ganglia stroke on the right, presenting with probable recurrent acute stroke involving left MCA territory. Patient's deficits have improved. However he still has mild dysarthria as well as right upper quadrant visual field defect. Numbness on left side is likely residual from his previous right cerebral infarction.  Stroke Risk Factors - diabetes mellitus, family history, hyperlipidemia and hypertension  Plan: 1. HgbA1c, fasting lipid panel 2. MRI, MRA  of the brain without contrast 3. PT consult, OT consult, Speech consult 4. Echocardiogram 5. Carotid  dopplers 6. Prophylactic therapy-Antiplatelet med: Aspirin and Brilinta 7. Risk factor modification 8. Telemetry monitoring   C.R. Roseanne Reno, MD [facility] [phone]  08/06/2013, 8:23 PM

## 2013-08-06 NOTE — ED Notes (Signed)
Ate sandwich and applesauce without difficulty.  Headache remains unchanged.

## 2013-08-06 NOTE — ED Notes (Signed)
Per EMS, pt began having a headache at 1530 today and at 1745 began having slurred speech, rt hand numbness and was unable to lift rt arm. No other symptoms. Pt also reports he had chest pain yesterday morning when riding a bike. Pt a&o x4. All symptoms have resolved.

## 2013-08-06 NOTE — H&P (Signed)
[facility] History and Physical  Charles [name] ZOX:[id number] DOB: 1934/10/13 DOA: 08/06/2013  Referring physician: Dr Blinda Leatherwood PCP: [name], MD   Chief Complaint:  Slurred speech and right arm numbness x 1 day  HPI:  78 year old male wit extensive cardiac disease status post CABG in June 2014 followed by drug-eluting stents placed in September 2014 and October 2014 recently went home following cardiac rehabilitation, hypertension, hyperlipidemia,diabetes mellitus (diet controlled), history of right basal ganglia stroke in 99 who was brought in by EMS for acute onset of slurred speech and right hand numbness earlier this evening. Patient reports that he was on the computer all day. He developed some frontal headache and when he sat down to have his supper he felt numbness over his right hand and fingers this was followed by slurred speech which lasted for almost 10 minutes. His wife noticed that patient was unable to hold it his fork properly. His wife called 911 and patient was brought to the ED. In the ED a code stroke was called . Patient did report some numbness of his right fingers on arrival however his symptoms shortly resolved. Patient denied any dizziness, nausea, vomiting, blurred vision, tinnitus, , shortness of breath, palpitations, abdominal pain, bowel or urinary symptoms. He denies any weakness of his extremities. He denies any fall or unsteadiness. He denied any changes his medications. He denies any recent fever, URI symptoms. In the ED patient's vitals were stable. Blood work done was unremarkable except for baseline chronic kidney disease. A head CT was done which was unremarkable for acute infarct. Neurology and prior hospitalists were consulted. On my evaluation patient still reports mild frontal headache. Denies any visual symptoms, any weakness or numbness of his extremities.  Review of Systems:  Constitutional: Denies fever, chills, diaphoresis, appetite change and  fatigue.  HEENT: Denies photophobia, eye pain, redness, hearing loss, ear pain, congestion, sore throat, rhinorrhea, sneezing, mouth sores, trouble swallowing, neck pain, neck stiffness and tinnitus.   Respiratory: Denies SOB, DOE, cough, chest tightness,  and wheezing.   Cardiovascular: Denies chest pain, palpitations and leg swelling.  Gastrointestinal: Denies nausea, vomiting, abdominal pain, diarrhea, constipation, blood in stool and abdominal distention.  Genitourinary: Denies dysuria, urgency, frequency, hematuria, flank pain and difficulty urinating.  Endocrine: Denies: hot or cold intolerance, sweats, changes in hair or nails, polyuria, polydipsia. Musculoskeletal: Denies myalgias, back pain, joint swelling, arthralgias and gait problem.  Skin: Denies pallor, rash and wound.  Neurological: Slurred speech, numbness of right fingers, frontal headache, Denies dizziness, seizures, syncope, weakness, light-headedness,  Psychiatric/Behavioral: Denies mood changes, confusion, nervousness, sleep disturbance and agitation   Past Medical History  Diagnosis Date  . Hypertension   . Hyperlipidemia     diet controlled, statins and zetia intolerant  . Retinopathy     mild glaucomea  . Transient ischemic attack 1999  . Histoplasmosis   . Chronic cough 08/2009    -allergy profile- june 2,2011- neg. -Max GERD rx/ off oils January 07 2010 x 3 weeks only but no benefit. - Sinus CT rec July 11,2011- refused due to claustrophobia -MCT neg for asthma 03/01/10 -Add 1st Gen H1 July 11,2011- better  . Macular degeneration   . PONV (postoperative nausea and vomiting)     extreme nausea  . Coronary artery disease     CABG 01-2013, stent 04-2013    . Diabetes mellitus     no meds   Past Surgical History  Procedure Laterality Date  . Cervical spine surgery  2002  plate  . Cholecystectomy  1985  . Coronary artery bypass graft N/A 01/21/2013    Procedure: Coronary Arery Bypass Grafting  Times Five Using Left  Internal Mammary Artery and Right Saphenous Leg Vein Harvested Endoscopically;  Surgeon: Loreli Slot, MD;  Location: [facility] OR;  Service: Open Heart Surgery;  Laterality: N/A;  . Percutaneous coronary stent intervention (pci-s)  101/31/2014    DrHhARDING   Social History:  reports that he quit smoking about 36 years ago. His smoking use included Cigarettes. He has a 10 pack-year smoking history. He has never used smokeless tobacco. He reports that he does not drink alcohol or use illicit drugs.  Allergies  Allergen Reactions  . Percocet [Oxycodone-Acetaminophen] Other (See Comments)    hallucinations  . Atorvastatin Other (See Comments)    Causes myalgias   . Ezetimibe Other (See Comments)    Causes dizziness  . Lovastatin Other (See Comments)    REACTION: weakness. Patient able to tolerate pravastatin  . Ranexa [Ranolazine] Other (See Comments)    Severe constipation    Family History  Problem Relation Age of Onset  . Coronary artery disease Neg Hx   . Stroke Neg Hx   . Colon cancer Neg Hx   . Prostate cancer Neg Hx   . Atopy Neg Hx   . Asthma Neg Hx   . Hypertension Mother   . Hypertension Father   . Diabetes Father   . Cancer Father     colon  . Hypertension Brother   . Cancer Brother     lung  . Hypertension Sister   . Cancer Sister     lung  . Diabetes Brother   . Diabetes Sister   . Diabetes      Grandparents   . Lung cancer Brother   . Lung cancer Sister     Prior to Admission medications   Medication Sig Start Date End Date Taking? Authorizing Provider  aspirin EC 81 MG tablet Take 81 mg by mouth every morning.   Yes Historical Provider, MD  Cholecalciferol (VITAMIN D3) 2000 UNITS TABS Take 2,000 Units by mouth daily.   Yes Historical Provider, MD  Coenzyme Q10 (COQ10) 100 MG CAPS Take 100 mg by mouth daily.   Yes Historical Provider, MD  diphenhydramine-acetaminophen (TYLENOL PM) 25-500 MG TABS Take 1 tablet by mouth at bedtime as needed (for sleep).    Yes Historical Provider, MD  finasteride (PROSCAR) 5 MG tablet Take 5 mg by mouth every morning.    Yes Historical Provider, MD  isosorbide mononitrate (IMDUR) 60 MG 24 hr tablet Take 60 mg by mouth daily.   Yes Historical Provider, MD  KRILL OIL 1000 MG CAPS Take 1,000 mg by mouth daily.    Yes Historical Provider, MD  losartan (COZAAR) 50 MG tablet Take 50 mg by mouth daily. 02/20/13  Yes Mihai Croitoru, MD  metoprolol (LOPRESSOR) 50 MG tablet Take 50 mg by mouth 2 (two) times daily.   Yes Historical Provider, MD  Multiple Vitamins-Minerals (EYE VITAMINS) TABS Take 1 tablet by mouth daily.   Yes Historical Provider, MD  nitroGLYCERIN (NITROSTAT) 0.4 MG SL tablet Place 0.4 mg under the tongue every 5 (five) minutes as needed for chest pain.   Yes Historical Provider, MD  polyethylene glycol (MIRALAX / GLYCOLAX) packet Take 17 g by mouth daily as needed for moderate constipation.   Yes Historical Provider, MD  pravastatin (PRAVACHOL) 40 MG tablet Take 40 mg by mouth at bedtime.  Yes Historical Provider, MD  Ticagrelor (BRILINTA) 90 MG TABS tablet Take 90 mg by mouth 2 (two) times daily.   Yes Historical Provider, MD    Physical Exam:  Filed Vitals:   08/06/13 2115 08/06/13 2145 08/06/13 2200 08/06/13 2215  BP: 168/68 153/65 154/65 160/71  Pulse: 60 64 66 70  Temp:      Resp: 19 16 16 16   SpO2: 99% 98% 97% 98%    Constitutional: Vital signs reviewed.  Patient is a well-developed and well-nourished in no acute distress and cooperative with exam.  HEENT: No pallor, no icterus, moist oral mucosa, no cervical lymphadenopathy Cardiovascular: Midsternal sternotomy scar , RRR, S1 normal, S2 normal, no MRG,  Pulmonary/Chest: CTAB, no wheezes, rales, or rhonchi Abdominal: Soft. Non-tender, non-distended, bowel sounds are normal, no masses, organomegaly, or guarding present.   extremities: Warm, no edema Neurological: A&O x3, Strenght is normal and symmetric bilaterally, cranial nerve II-XII  are grossly intact, no focal motor deficit, sensory intact to light touch bilaterally.  cerebellar function intact. Gait not assessed  Labs on Admission:  Basic Metabolic Panel:  Recent Labs Lab 08/06/13 1853 08/06/13 1922  NA 143 143  K 4.5 4.2  CL 104 109  CO2 23  --   GLUCOSE 84 94  BUN 32* 33*  CREATININE 1.38* 1.50*  CALCIUM 9.4  --    Liver Function Tests:  Recent Labs Lab 08/06/13 1853  AST 20  ALT 18  ALKPHOS 64  BILITOT 0.5  PROT 7.2  ALBUMIN 3.9   No results found for this basename: LIPASE, AMYLASE,  in the last 168 hours No results found for this basename: AMMONIA,  in the last 168 hours CBC:  Recent Labs Lab 08/06/13 1853 08/06/13 1922  WBC 8.2  --   NEUTROABS 4.3  --   HGB 14.7 15.3  HCT 44.3 45.0  MCV 93.7  --   PLT 226  --    Cardiac Enzymes:  Recent Labs Lab 08/06/13 1853  TROPONINI <0.30   BNP: No components found with this basename: POCBNP,  CBG:  Recent Labs Lab 08/06/13 1855  GLUCAP 96    Radiological Exams on Admission: Ct Head Wo Contrast  08/06/2013   EXAM: CT HEAD WITHOUT CONTRAST  TECHNIQUE: Contiguous axial images were obtained from the base of the skull through the vertex without intravenous contrast.  COMPARISON:  None.  FINDINGS: No acute intracranial hemorrhage. No focal mass lesion. No CT evidence of acute infarction. No midline shift or mass effect. No hydrocephalus. Basilar cisterns are patent.  There is a lacune infarction within the right basal ganglia. Mild generalized cortical atrophy.  Paranasal sinuses and  mastoid air cells are clear.  IMPRESSION: 1. No acute intracranial hemorrhage. 2. No CT evidence of acute infarction. 3. Remote lacunar infarction in the  right basal ganglia. 4. Atrophy. Findings conveyed toDr.  Roseanne Reno On 08/06/2013  at19:11.   Electronically Signed   By: Genevive Bi M.D.   On: 08/06/2013 19:13    EKG: Normal sinus rhythm at 60, no ST-T changes  Assessment/Plan  Principal  Problem:   TIA (transient ischemic attack) Symptoms have resolved. Admit to telemetry on observation. Head CT on admission unremarkable for acute ischemia or infarct. Ordered MRI brain/MRA head. 2-D echo and carotid Dopplers. -Neuro checks every 4 hours. Continue home dose baby aspirin. Patient on the brillinta as well which is continued. Continue pravastatin. -PT/OT eval. Bedside swallow eval. -Check lipid panel and hemoglobin A1c. Allow permissive hypertension. -The  patient neurology recommendations   Active Problems:     CAD- S/P CABG x 5 , recent cardiac cath with DES Continue ASA, brillinta, statin and BB.  Will add Dr Royann Shivers as consulting physician. patient does report a few seconds chest discomfort yesterday which subsided on its own.  monitor on tele.  Hypertension Resume home blood pressure medication. I allow permissive hypertension for now.  Hyperlipidemia Continue statin Check LDL  Diabetes mellitus Diet controlled. Place on sliding scale insulin. Check A1c.    Chronic renal insufficiency, stage III (moderate) Renal function at baseline. Continue to monitor     Diet: Cardiac DVT prophylaxis: Subcutaneous heparin  Code Status: Full code Family Communication: Wife at bedside Disposition Plan: Home once workup completed  Charles [name] [facility] Pager [phone]  If 7PM-7AM, please contact night-coverage [email or web address] Password [facility] 08/06/2013, 10:34 PM  Total time spent on admission: 50 minutes

## 2013-08-07 ENCOUNTER — Encounter (HOSPITAL_COMMUNITY): Payer: Medicare Other

## 2013-08-07 ENCOUNTER — Observation Stay (HOSPITAL_COMMUNITY): Payer: Medicare Other

## 2013-08-07 DIAGNOSIS — I517 Cardiomegaly: Secondary | ICD-10-CM

## 2013-08-07 DIAGNOSIS — I10 Essential (primary) hypertension: Secondary | ICD-10-CM

## 2013-08-07 DIAGNOSIS — E785 Hyperlipidemia, unspecified: Secondary | ICD-10-CM

## 2013-08-07 DIAGNOSIS — I6350 Cerebral infarction due to unspecified occlusion or stenosis of unspecified cerebral artery: Secondary | ICD-10-CM

## 2013-08-07 DIAGNOSIS — E119 Type 2 diabetes mellitus without complications: Secondary | ICD-10-CM

## 2013-08-07 LAB — HEMOGLOBIN A1C: Mean Plasma Glucose: 131 mg/dL — ABNORMAL HIGH (ref <117)

## 2013-08-07 LAB — LIPID PANEL
Cholesterol: 167 mg/dL (ref 0–200)
Total CHOL/HDL Ratio: 3.2 RATIO
Triglycerides: 165 mg/dL — ABNORMAL HIGH (ref <150)
VLDL: 33 mg/dL (ref 0–40)

## 2013-08-07 LAB — GLUCOSE, CAPILLARY
Glucose-Capillary: 170 mg/dL — ABNORMAL HIGH (ref 70–99)
Glucose-Capillary: 106 mg/dL — ABNORMAL HIGH (ref 70–99)
Glucose-Capillary: 95 mg/dL (ref 70–99)
Glucose-Capillary: 104 mg/dL — ABNORMAL HIGH (ref 70–99)

## 2013-08-07 MED ORDER — LORAZEPAM 2 MG/ML IJ SOLN
2.0000 mg | Freq: Once | INTRAMUSCULAR | Status: AC
  Administered 2013-08-07: 2 mg via INTRAVENOUS
  Filled 2013-08-07: qty 1

## 2013-08-07 NOTE — Discharge Summary (Signed)
Physician Discharge Summary  Charles [name] RUE:[id number] DOB: [birth year] DOA: 08/06/2013  PCP: [name], MD  Admit date: 08/06/2013 Discharge date: 08/08/2013  Time spent: 35 minutes  Recommendations for Outpatient Follow-up:  1. Consider holter monitor to r/o a fib  Discharge Diagnoses:  Principal Problem:   TIA (transient ischemic attack) Active Problems:   DIABETES MELLITUS, TYPE II   HYPERLIPIDEMIA   HYPERTENSION   CAD- S/P CABG x 5 (LIMA-LAD, SVG-RI-OM1, SVG to distal RCA, SVG-PDA) 01/21/13   Angina, class III   Chronic renal insufficiency, stage III (moderate)   H/O: CVA (cerebrovascular accident)   Discharge Condition: improved  Diet recommendation: cardiac/diabetic  Filed Weights   08/06/13 2256  Weight: 80.65 kg (177 lb 12.8 oz)    History of present illness:  78 year old male wit extensive cardiac disease status post CABG in June 2014 followed by drug-eluting stents placed in September 2014 and October 2014 recently went home following cardiac rehabilitation, hypertension, hyperlipidemia,diabetes mellitus (diet controlled), history of right basal ganglia stroke in 89 who was brought in by EMS for acute onset of slurred speech and right hand numbness earlier this evening. Patient reports that he was on the computer all day. He developed some frontal headache and when he sat down to have his supper he felt numbness over his right hand and fingers this was followed by slurred speech which lasted for almost 10 minutes. His wife noticed that patient was unable to hold it his fork properly. His wife called 911 and patient was brought to the ED. In the ED a code stroke was called . Patient did report some numbness of his right fingers on arrival however his symptoms shortly resolved. Patient denied any dizziness, nausea, vomiting, blurred vision, tinnitus, , shortness of breath, palpitations, abdominal pain, bowel or urinary symptoms. He denies any weakness of his extremities. He  denies any fall or unsteadiness. He denied any changes his medications. He denies any recent fever, URI symptoms.  In the ED patient's vitals were stable. Blood work done was unremarkable except for baseline chronic kidney disease. A head CT was done which was unremarkable for acute infarct. Neurology and prior hospitalists were consulted.  On my evaluation patient still reports mild frontal headache. Denies any visual symptoms, any weakness or numbness of his extremities.   Hospital Course:  TIA (transient ischemic attack)  Symptoms have resolved. Admit to telemetry on observation. Head CT on admission unremarkable for acute ischemia or infarct.  MRI ok Continue home dose baby aspirin. Patient on the brillinta as well which is continued.  Continue pravastatin.  -PT/OT eval. Bedside swallow eval.  - lipid panel ok on statin  hemoglobin A1c ok  CAD- S/P CABG x 5 , recent cardiac cath with DES  Continue ASA, brillinta, statin and BB.     Hypertension  Resume home blood pressure medication  Hyperlipidemia  Continue statin  LDL 82   Diabetes mellitus  Diet controlled.   Chronic renal insufficiency, stage III (moderate)  Renal function at baseline. Continue to monitor    Procedures: Echo: Study Conclusions  - Left ventricle: The cavity size was normal. There was mild focal basal hypertrophy of the septum, with an appearance suggesting concentric remodeling (increased wall thickness with normal wall mass). Systolic function was normal. The estimated ejection fraction was in the range of 55% to 65%. Wall motion was normal; there were no regional wall motion abnormalities. Features are consistent with a pseudonormal left ventricular filling pattern, with concomitant abnormal relaxation and  increased filling pressure (grade 2 diastolic dysfunction). Doppler parameters are consistent with elevated mean left atrial filling pressure. - Mitral valve: Mildly calcified annulus. -  Left atrium: The atrium was mildly dilated. - Right atrium: The atrium was mildly dilated. - Atrial septum: No defect or patent foramen ovale was identified. Impressions:  - No evidence of hemodynamically significant valve disease. No cardiac source of embolism was identified, but cannot be ruled out on the basis of this examination   carotids  Consultations:  neuro  Discharge Exam: Filed Vitals:   08/07/13 1738  BP: 169/79  Pulse: 60  Temp: 97.5 F (36.4 C)  Resp: 18    General: A+Ox3, NAD Cardiovascular: rrr Respiratory: clear anterior  Discharge Instructions      Discharge Orders   Future Orders Complete By Expires   Diet - low sodium heart healthy  As directed    Diet Carb Modified  As directed    Driving Restrictions  As directed    Comments:     No driving until seen by PCP   Increase activity slowly  As directed        Medication List         aspirin EC 81 MG tablet  Take 81 mg by mouth every morning.     CoQ10 100 MG Caps  Take 100 mg by mouth daily.     diphenhydramine-acetaminophen 25-500 MG Tabs  Commonly known as:  TYLENOL PM  Take 1 tablet by mouth at bedtime as needed (for sleep).     EYE VITAMINS Tabs  Take 1 tablet by mouth daily.     finasteride 5 MG tablet  Commonly known as:  PROSCAR  Take 5 mg by mouth every morning.     isosorbide mononitrate 60 MG 24 hr tablet  Commonly known as:  IMDUR  Take 60 mg by mouth daily.     Krill Oil 1000 MG Caps  Take 1,000 mg by mouth daily.     losartan 50 MG tablet  Commonly known as:  COZAAR  Take 50 mg by mouth daily.     metoprolol 50 MG tablet  Commonly known as:  LOPRESSOR  Take 50 mg by mouth 2 (two) times daily.     nitroGLYCERIN 0.4 MG SL tablet  Commonly known as:  NITROSTAT  Place 0.4 mg under the tongue every 5 (five) minutes as needed for chest pain.     polyethylene glycol packet  Commonly known as:  MIRALAX / GLYCOLAX  Take 17 g by mouth daily as needed for  moderate constipation.     pravastatin 40 MG tablet  Commonly known as:  PRAVACHOL  Take 40 mg by mouth at bedtime.     Ticagrelor 90 MG Tabs tablet  Commonly known as:  BRILINTA  Take 90 mg by mouth 2 (two) times daily.     Vitamin D3 2000 UNITS Tabs  Take 2,000 Units by mouth daily.       Allergies  Allergen Reactions  . Percocet [Oxycodone-Acetaminophen] Other (See Comments)    hallucinations  . Atorvastatin Other (See Comments)    Causes myalgias   . Ezetimibe Other (See Comments)    Causes dizziness  . Lovastatin Other (See Comments)    REACTION: weakness. Patient able to tolerate pravastatin  . Ranexa [Ranolazine] Other (See Comments)    Severe constipation   Follow-up Information   Follow up with Gates Rigg, MD. Schedule an appointment as soon as possible for a visit in 2  months. (stroke clinic)    Specialties:  Neurology, Radiology   Contact information:   [address] [place] [address] [phone]       Follow up with [name], MD In 1 week.   Specialty:  Internal Medicine   Contact information:   [phone] [facility] [address] [place] [address] [phone]        The results of significant diagnostics from this hospitalization (including imaging, microbiology, ancillary and laboratory) are listed below for reference.    Significant Diagnostic Studies: Ct Head Wo Contrast  08/06/2013   EXAM: CT HEAD WITHOUT CONTRAST  TECHNIQUE: Contiguous axial images were obtained from the base of the skull through the vertex without intravenous contrast.  COMPARISON:  None.  FINDINGS: No acute intracranial hemorrhage. No focal mass lesion. No CT evidence of acute infarction. No midline shift or mass effect. No hydrocephalus. Basilar cisterns are patent.  There is a lacune infarction within the right basal ganglia. Mild generalized cortical atrophy.  Paranasal sinuses and  mastoid air cells are clear.  IMPRESSION: 1. No acute  intracranial hemorrhage. 2. No CT evidence of acute infarction. 3. Remote lacunar infarction in the  right basal ganglia. 4. Atrophy. Findings conveyed toDr.  Roseanne Reno On 08/06/2013  at19:11.   Electronically Signed   By: Genevive Bi M.D.   On: 08/06/2013 19:13   Mri Brain Without Contrast  08/07/2013   CLINICAL DATA:  Acute onset of slurred speech and right hand numbness.  EXAM: MRI HEAD WITHOUT CONTRAST  MRA HEAD WITHOUT CONTRAST  TECHNIQUE: Multiplanar, multiecho pulse sequences of the brain and surrounding structures were obtained without intravenous contrast. Angiographic images of the head were obtained using MRA technique without contrast.  COMPARISON:  Head CT 08/06/2013  FINDINGS: MRI HEAD FINDINGS  Diffusion imaging does not show any acute or subacute infarction. The brainstem is normal. No cerebellar insult. The cerebral hemispheres show moderate chronic small-vessel changes affecting the deep and subcortical white matter. There is an old lacunar infarction in the upper basal ganglia on the right. No evidence of mass lesion, hemorrhage, hydrocephalus or extra-axial collection. No pituitary mass. Sinuses, middle ears and mastoids are clear.  MRA HEAD FINDINGS  Both internal carotid arteries are widely patent into the brain. No siphon stenosis. The anterior and middle cerebral vessels show mild atherosclerotic irregularity but are widely patent. No aneurysm or vascular malformation.  The left vertebral artery is the dominant vessel widely patent to the basilar. The right vertebral artery terminates in PICA. No basilar stenosis. Both posterior cerebral arteries take a fetal origin from the anterior circulation.  IMPRESSION: No major vessel occlusion or correctable proximal stenosis. Mild atherosclerotic irregularity of the intracranial branch vessels, most notable in the anterior and middle cerebral artery territories.  No acute brain finding. Moderate chronic small vessel change of the cerebral  hemispheric white matter. Old right basal ganglia lacunar infarction.   Electronically Signed   By: Paulina Fusi M.D.   On: 08/07/2013 12:32   Mr Maxine Glenn Head/brain Wo Cm  08/07/2013   CLINICAL DATA:  Acute onset of slurred speech and right hand numbness.  EXAM: MRI HEAD WITHOUT CONTRAST  MRA HEAD WITHOUT CONTRAST  TECHNIQUE: Multiplanar, multiecho pulse sequences of the brain and surrounding structures were obtained without intravenous contrast. Angiographic images of the head were obtained using MRA technique without contrast.  COMPARISON:  Head CT 08/06/2013  FINDINGS: MRI HEAD FINDINGS  Diffusion imaging does not show any acute or  subacute infarction. The brainstem is normal. No cerebellar insult. The cerebral hemispheres show moderate chronic small-vessel changes affecting the deep and subcortical white matter. There is an old lacunar infarction in the upper basal ganglia on the right. No evidence of mass lesion, hemorrhage, hydrocephalus or extra-axial collection. No pituitary mass. Sinuses, middle ears and mastoids are clear.  MRA HEAD FINDINGS  Both internal carotid arteries are widely patent into the brain. No siphon stenosis. The anterior and middle cerebral vessels show mild atherosclerotic irregularity but are widely patent. No aneurysm or vascular malformation.  The left vertebral artery is the dominant vessel widely patent to the basilar. The right vertebral artery terminates in PICA. No basilar stenosis. Both posterior cerebral arteries take a fetal origin from the anterior circulation.  IMPRESSION: No major vessel occlusion or correctable proximal stenosis. Mild atherosclerotic irregularity of the intracranial branch vessels, most notable in the anterior and middle cerebral artery territories.  No acute brain finding. Moderate chronic small vessel change of the cerebral hemispheric white matter. Old right basal ganglia lacunar infarction.   Electronically Signed   By: Paulina Fusi M.D.   On:  08/07/2013 12:32    Microbiology: No results found for this or any previous visit (from the past 240 hour(s)).   Labs: Basic Metabolic Panel:  Recent Labs Lab 08/06/13 1853 08/06/13 1922  NA 143 143  K 4.5 4.2  CL 104 109  CO2 23  --   GLUCOSE 84 94  BUN 32* 33*  CREATININE 1.38* 1.50*  CALCIUM 9.4  --    Liver Function Tests:  Recent Labs Lab 08/06/13 1853  AST 20  ALT 18  ALKPHOS 64  BILITOT 0.5  PROT 7.2  ALBUMIN 3.9   No results found for this basename: LIPASE, AMYLASE,  in the last 168 hours No results found for this basename: AMMONIA,  in the last 168 hours CBC:  Recent Labs Lab 08/06/13 1853 08/06/13 1922  WBC 8.2  --   NEUTROABS 4.3  --   HGB 14.7 15.3  HCT 44.3 45.0  MCV 93.7  --   PLT 226  --    Cardiac Enzymes:  Recent Labs Lab 08/06/13 1853  TROPONINI <0.30   BNP: BNP (last 3 results) No results found for this basename: PROBNP,  in the last 8760 hours CBG:  Recent Labs Lab 08/06/13 1855 08/06/13 2254 08/07/13 0636 08/07/13 1237 08/07/13 1556  GLUCAP 96 170* 106* 95 104*       Signed:  Ashika Apuzzo  [facility] 08/08/2013, 2:38 PM

## 2013-08-07 NOTE — Progress Notes (Signed)
Echo Lab  2D Echocardiogram completed.  Charles [name] L Amry Cathy, RDCS 08/07/2013 10:38 AM

## 2013-08-07 NOTE — Progress Notes (Signed)
Stroke Team Progress Note  HISTORY Charles [name] is an 78 y.o. 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 Onset of symptoms was 1745 today 08/06/2013. Patient has been taking aspirin and Brilinta. CT scan of his head showed no acute intracranial abnormality. Old right basal ganglia stroke was noted. Patient's speech abnormality significantly improved and and numbness resolved by the time he arrived in the emergency room. NIH stroke score was 3.  Patient was not a TPA candidate secondary to rapidly resolving deficits . He was admitted for further evaluation and treatment.  SUBJECTIVE His wife is at the bedside.  Overall he feels his condition is completely resolved. He reports being on the computer most of the day, then developed a headache around 4p. While eating dinner later, he developed expressive aphasia and right hand numbness.   OBJECTIVE Most recent Vital Signs: Filed Vitals:   08/06/13 2256 08/07/13 0130 08/07/13 0528 08/07/13 1001  BP: 162/82 152/66 155/66 178/83  Pulse: 65 61 61 62  Temp: 97.9 F (36.6 C) 98.3 F (36.8 C) 98 F (36.7 C) 98 F (36.7 C)  TempSrc: Oral Oral Oral Oral  Resp: 18 18 18 18   Height: 5' 11.5" (1.816 m)     Weight: 80.65 kg (177 lb 12.8 oz)     SpO2: 99% 98% 98% 98%   CBG (last 3)   Recent Labs  08/06/13 1855 08/06/13 2254 08/07/13 0636  GLUCAP 96 170* 106*    IV Fluid Intake:     MEDICATIONS  . aspirin EC  81 mg Oral q morning - 10a  . beta carotene w/minerals  1 tablet Oral Daily  . cholecalciferol  2,000 Units Oral Daily  . finasteride  5 mg Oral q morning - 10a  . insulin aspart  0-9 Units Subcutaneous TID WC  . isosorbide mononitrate  60 mg Oral Daily  . losartan  50 mg Oral Daily  . metoprolol  50 mg Oral BID  .  simvastatin  20 mg Oral q1800  . Ticagrelor  90 mg Oral BID   PRN:  nitroGLYCERIN, polyethylene glycol, zolpidem  Diet:  Carb Control thin liquids Activity:  Bedrest,  Bathroom privileges with assistance DVT Prophylaxis:  SCDs   CLINICALLY SIGNIFICANT STUDIES Basic Metabolic Panel:   Recent Labs Lab 08/06/13 1853 08/06/13 1922  NA 143 143  K 4.5 4.2  CL 104 109  CO2 23  --   GLUCOSE 84 94  BUN 32* 33*  CREATININE 1.38* 1.50*  CALCIUM 9.4  --    Liver Function Tests:   Recent Labs Lab 08/06/13 1853  AST 20  ALT 18  ALKPHOS 64  BILITOT 0.5  PROT 7.2  ALBUMIN 3.9   CBC:   Recent Labs Lab 08/06/13 1853 08/06/13 1922  WBC 8.2  --   NEUTROABS 4.3  --   HGB 14.7 15.3  HCT 44.3 45.0  MCV 93.7  --   PLT 226  --    Coagulation:   Recent Labs Lab 08/06/13 1853  LABPROT 13.8  INR 1.08   Cardiac Enzymes:   Recent Labs Lab 08/06/13 1853  TROPONINI <0.30   Urinalysis:   Recent Labs Lab 08/06/13 2203  COLORURINE YELLOW  LABSPEC 1.029  PHURINE 5.5  GLUCOSEU NEGATIVE  HGBUR NEGATIVE  BILIRUBINUR NEGATIVE  KETONESUR NEGATIVE  PROTEINUR  NEGATIVE  UROBILINOGEN 0.2  NITRITE NEGATIVE  LEUKOCYTESUR NEGATIVE   Lipid Panel    Component Value Date/Time   CHOL 167 08/07/2013 0700   TRIG 165* 08/07/2013 0700   HDL 52 08/07/2013 0700   CHOLHDL 3.2 08/07/2013 0700   VLDL 33 08/07/2013 0700   LDLCALC 82 08/07/2013 0700   HgbA1C  Lab Results  Component Value Date   HGBA1C 6.5* 05/23/2013    Urine Drug Screen:     Component Value Date/Time   LABOPIA NONE DETECTED 08/06/2013 2203   COCAINSCRNUR NONE DETECTED 08/06/2013 2203   LABBENZ NONE DETECTED 08/06/2013 2203   AMPHETMU NONE DETECTED 08/06/2013 2203   THCU NONE DETECTED 08/06/2013 2203   LABBARB NONE DETECTED 08/06/2013 2203    Alcohol Level:   Recent Labs Lab 08/06/13 1853  ETH <11    CT of the brain  08/06/2013    1. No acute intracranial hemorrhage. 2. No CT evidence of acute  infarction. 3. Remote lacunar infarction in the  right basal ganglia. 4. Atrophy.  MRI of the brain  08/07/2013  No acute brain finding. Moderate chronic small vessel change of the cerebral hemispheric white matter. Old right basal ganglia lacunar infarction.     MRA of the brain  08/07/2013  No major vessel occlusion or correctable proximal stenosis. Mild atherosclerotic irregularity of the intracranial branch vessels, most notable in the anterior and middle cerebral artery territories.    2D Echocardiogram    Carotid Doppler  No evidence of hemodynamically significant internal carotid artery stenosis. Vertebral artery flow is antegrade.   EKG  normal sinus rhythm, see EKG for full report  Therapy Recommendations no therapy needs  Physical Exam   Neurologic Examination:  Mental Status:  Alert, oriented, thought content appropriate. Speech fluent without evidence of aphasia. Able to follow commands without difficulty.  Cranial Nerves:  II-right upper visual field defect to finger counting.  III/IV/VI-Pupils were equal and reacted. Extraocular movements were full and conjugate.  V/VII-slight left facial numbness; no facial weakness.  VIII-normal.  X-mild dysarthria; symmetrical palatal movement.  Motor: 5/5 bilaterally with normal tone and bulk  Sensory: Reduced perception of tactile sensation over left upper and lower extremities compared to right extremities.  Deep Tendon Reflexes: 1+ and symmetric.  Plantars: Mute bilaterally  Cerebellar: Normal finger-to-nose testing.  Carotid auscultation: Normal   ASSESSMENT [name] is a 78 y.o. male presenting with acute onset of speech difficulty and numbness involving right hand and face. Imaging confirms no acute infarct. Dx: left brain TIA. On aspirin 81 mg orally every day and Brilinta prior to admission. Now on aspirin 81 mg orally every day and Brilinta for secondary stroke prevention. Patient with no resultant neuro deficits.  Work up underway.  hypertension Hyperlipidemia, LDL 82, on pravachol 40 PTA, now on pravachol 40, goal LDL < 70 for diabetics Hx TIA CAD Diabetes, HgbA1c 6.5, goal < 7.0  Hospital day # 1  TREATMENT/PLAN  Continue aspirin 81 mg orally every day and brilinta for secondary stroke prevention.  Follow up 2D  Consider OP tele monitoring for atrial fibrillation at discharge. Can be arranged with Dr. Erin Hearing office  OK for discharge once 2D resulted from stroke standpoint  Follow up Dr. Pearlean Brownie, stroke clinic, in 2 months.  Annie Main, MSN, RN, ANVP-BC, ANP-BC, [facility] Pager: [phone] 08/07/2013 12:38 PM  I have personally obtained a history, examined the patient, evaluated imaging results, and formulated the assessment and plan of care.  I agree with the above.  Elspeth Cho, DO Neurology-Stroke

## 2013-08-07 NOTE — Evaluation (Signed)
Occupational Therapy Evaluation Patient Details Name: Charles [name] MRN: [id number] DOB: Apr 10, 1935 Today's Date: 08/07/2013 Time: 1191-4782 OT Time Calculation (min): 21 min  OT Assessment / Plan / Recommendation History of present illness Charles [name] is an 78 y.o. 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 Onset of symptoms was 1745 today 08/06/2013. Patient has been taking aspirin and Brilinta. CT scan of his head showed no acute intracranial abnormality. Old right basal ganglia stroke was noted. Patient's speech abnormality significantly improved and and numbness resolved by the time he arrived in the emergency room. NIH stroke score was 3.  Patient was not a TPA candidate secondary to rapidly resolving deficits . He was admitted for further evaluation and treatment.   Clinical Impression   Pt moving well during session. Education provided to pt. No further OT needs at this time.     OT Assessment  Patient does not need any further OT services    Follow Up Recommendations  No OT follow up    Barriers to Discharge      Equipment Recommendations  None recommended by OT    Recommendations for Other Services    Frequency       Precautions / Restrictions     Pertinent Vitals/Pain No pain reported.     ADL  Lower Body Dressing: Supervision/safety (overall supervision level due to LOB donning pants) Where Assessed - Lower Body Dressing: Supported standing;Supported sitting (donned pants standing and donned/doffed socks-sitting) Toilet Transfer: Community education officer Method: Sit to Barista: Comfort height toilet Tub/Shower Transfer: Designer, industrial/product Method: Ambulating Transfers/Ambulation Related to ADLs:  Supervision/Independent-pt ran into chair on one occassion in room. Independent with sit <> stand transfers. ADL Comments: Pt donned pants and pt unsteady and used rail of bed for balance. Educated to sit for LB bathing and also to sit for LB dressing.  Discussed having no rugs in house and also having non skid rugs in bathroom for safety. Educated on signs/symptoms of stroke as well as importance of getting help right away. Educated on reducing sodium intake (avoiding canned foods) as increased sodium increases risk for a stroke. Pt able to locate room and remember three words OT told him to remember (intial difficulty remembering last item, but able to recall with time). Pt able to walk around unit and get back to his room (difficult for him when with PT).    OT Diagnosis:    OT Problem List:   OT Treatment Interventions:     OT Goals(Current goals can be found in the care plan section)    Visit Information  Last OT Received On: 08/07/13 Assistance Needed: +1 History of Present Illness: Charles [name] is an 78 y.o. 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 Onset of symptoms was 1745 today 08/06/2013. Patient has been taking aspirin and Brilinta. CT scan of his head showed no acute intracranial abnormality. Old right basal ganglia stroke was noted. Patient's speech abnormality significantly improved and and numbness resolved by the time he arrived in the emergency room. NIH stroke score was 3.  Patient was not a TPA candidate secondary to  rapidly resolving deficits . He was admitted for further evaluation and treatment.       Prior Functioning     Home Living Family/patient expects to be discharged to:: Private residence Living Arrangements: Spouse/significant other Available Help  at Discharge: Family Home Access: Stairs to enter Secretary/administrator of Steps: 3 Entrance Stairs-Rails: Can reach both Home Layout: Two level;Able to live on main level with bedroom/bathroom Home Equipment: Gilmer Mor - single point;Shower seat - built in Prior Function Level of Independence: Independent Communication Communication: No difficulties Dominant Hand: Right         Vision/Perception Vision - History Baseline Vision: Wears glasses all the time Vision - Assessment Vision Assessment: Vision tested Tracking/Visual Pursuits: Able to track stimulus in all quads without difficulty Visual Fields: No apparent deficits   Cognition  Cognition Arousal/Alertness: Awake/alert Behavior During Therapy: WFL for tasks assessed/performed Overall Cognitive Status: Within Functional Limits for tasks assessed Area of Impairment: Problem solving Problem Solving: Slow processing    Extremity/Trunk Assessment Upper Extremity Assessment Upper Extremity Assessment: Overall WFL for tasks assessed Lower Extremity Assessment Lower Extremity Assessment: Defer to PT evaluation     Mobility Bed Mobility Bed Mobility: Supine to Sit;Sit to Supine Supine to Sit: 7: Independent Sit to Supine: 7: Independent Transfers Transfers: Sit to Stand;Stand to Sit Sit to Stand: 7: Independent;From toilet;From chair/3-in-1;From bed Stand to Sit: 7: Independent;To bed;To toilet;To chair/3-in-1     Exercise        End of Session OT - End of Session Activity Tolerance: Patient tolerated treatment well Patient left: in bed;with call bell/phone within reach  GO Functional Assessment Tool Used: clinical judgment Functional Limitation: Self care Self Care Current Status (F6213): At least 1 percent but less than 20 percent impaired, limited or restricted Self Care Goal Status (Y8657): At least 1 percent but less than 20 percent impaired, limited or restricted Self Care Discharge Status [phone]): At least  1 percent but less than 20 percent impaired, limited or restricted   Charles [name] OTR/L [phone] 08/07/2013, 4:49 PM

## 2013-08-07 NOTE — Progress Notes (Signed)
VASCULAR LAB PRELIMINARY  PRELIMINARY  PRELIMINARY  PRELIMINARY  Carotid duplex  completed.    Preliminary report:  Bilateral:  1-39% ICA stenosis.  Vertebral artery flow is antegrade.      Makeisha Jentsch, RVT 08/07/2013, 12:12 PM

## 2013-08-07 NOTE — Progress Notes (Signed)
UR completed 

## 2013-08-07 NOTE — Progress Notes (Signed)
Pt d/c to home by car with family. Assessment stable. Pt verbalizes understanding of d/c instructions. 

## 2013-08-07 NOTE — Progress Notes (Signed)
[facility] PROGRESS NOTE  DERAK SCHURMAN ZOX:[id number] DOB: 1935-07-27 DOA: 08/06/2013 PCP: [name], MD  Assessment/Plan: TIA (transient ischemic attack)  Symptoms have resolved. Admit to telemetry on observation. Head CT on admission unremarkable for acute ischemia or infarct.  Ordered MRI brain/MRA head (ativan to be given before) - 2-D echo and carotid Dopplers.  Continue home dose baby aspirin. Patient on the brillinta as well which is continued.  Continue pravastatin.  -PT/OT eval. Bedside swallow eval.  - lipid panel ok on statin hemoglobin A1c    CAD- S/P CABG x 5 , recent cardiac cath with DES  Continue ASA, brillinta, statin and BB.  Dr Royann Shivers as consulting physician. patient does report a few seconds chest discomfort yesterday which subsided on its own. monitor on tele.   Hypertension  Resume home blood pressure medication  Hyperlipidemia  Continue statin  LDL 82  Diabetes mellitus  Diet controlled. Place on sliding scale insulin. Check A1c.   Chronic renal insufficiency, stage III (moderate)  Renal function at baseline. Continue to monitor      Code Status: full Family Communication: patient Disposition Plan: PT eval   Consultants:  neuro  Procedures:  Echo  MRI  carotids  Antibiotics:    HPI/Subjective: Doing well Nervous about MRI  Objective: Filed Vitals:   08/07/13 0528  BP: 155/66  Pulse: 61  Temp: 98 F (36.7 C)  Resp: 18   No intake or output data in the 24 hours ending 08/07/13 8119 Filed Weights   08/06/13 2256  Weight: 80.65 kg (177 lb 12.8 oz)    Exam:   General:  A+Ox3, NAD  Cardiovascular: rrr  Respiratory: clear  Abdomen: +BS, soft  Musculoskeletal: moves all 4 ext   Data Reviewed: Basic Metabolic Panel:  Recent Labs Lab 08/06/13 1853 08/06/13 1922  NA 143 143  K 4.5 4.2  CL 104 109  CO2 23  --   GLUCOSE 84 94  BUN 32* 33*  CREATININE 1.38* 1.50*  CALCIUM 9.4  --    Liver  Function Tests:  Recent Labs Lab 08/06/13 1853  AST 20  ALT 18  ALKPHOS 64  BILITOT 0.5  PROT 7.2  ALBUMIN 3.9   No results found for this basename: LIPASE, AMYLASE,  in the last 168 hours No results found for this basename: AMMONIA,  in the last 168 hours CBC:  Recent Labs Lab 08/06/13 1853 08/06/13 1922  WBC 8.2  --   NEUTROABS 4.3  --   HGB 14.7 15.3  HCT 44.3 45.0  MCV 93.7  --   PLT 226  --    Cardiac Enzymes:  Recent Labs Lab 08/06/13 1853  TROPONINI <0.30   BNP (last 3 results) No results found for this basename: PROBNP,  in the last 8760 hours CBG:  Recent Labs Lab 08/06/13 1855 08/06/13 2254 08/07/13 0636  GLUCAP 96 170* 106*    No results found for this or any previous visit (from the past 240 hour(s)).   Studies: Ct Head Wo Contrast  08/06/2013   EXAM: CT HEAD WITHOUT CONTRAST  TECHNIQUE: Contiguous axial images were obtained from the base of the skull through the vertex without intravenous contrast.  COMPARISON:  None.  FINDINGS: No acute intracranial hemorrhage. No focal mass lesion. No CT evidence of acute infarction. No midline shift or mass effect. No hydrocephalus. Basilar cisterns are patent.  There is a lacune infarction within the right basal ganglia. Mild generalized cortical atrophy.  Paranasal sinuses and  mastoid air cells are clear.  IMPRESSION: 1. No acute intracranial hemorrhage. 2. No CT evidence of acute infarction. 3. Remote lacunar infarction in the  right basal ganglia. 4. Atrophy. Findings conveyed toDr.  Roseanne Reno On 08/06/2013  at19:11.   Electronically Signed   By: Genevive Bi M.D.   On: 08/06/2013 19:13    Scheduled Meds: . aspirin EC  81 mg Oral q morning - 10a  . beta carotene w/minerals  1 tablet Oral Daily  . cholecalciferol  2,000 Units Oral Daily  . finasteride  5 mg Oral q morning - 10a  . heparin  5,000 Units Subcutaneous Q8H  . insulin aspart  0-9 Units Subcutaneous TID WC  . isosorbide mononitrate  60 mg  Oral Daily  . LORazepam  2 mg Intravenous Once  . losartan  50 mg Oral Daily  . metoprolol  50 mg Oral BID  . simvastatin  20 mg Oral q1800  . Ticagrelor  90 mg Oral BID   Continuous Infusions:   Principal Problem:   TIA (transient ischemic attack) Active Problems:   DIABETES MELLITUS, TYPE II   HYPERLIPIDEMIA   HYPERTENSION   CAD- S/P CABG x 5 (LIMA-LAD, SVG-RI-OM1, SVG to distal RCA, SVG-PDA) 01/21/13   Angina, class III   Chronic renal insufficiency, stage III (moderate)   H/O: CVA (cerebrovascular accident)    Time spent: 35 min    [facility], Malayiah Mcbrayer  [facility] Pager [phone]. If 7PM-7AM, please contact night-coverage at [email or web address], password [facility] 08/07/2013, 9:22 AM  LOS: 1 day

## 2013-08-07 NOTE — Evaluation (Addendum)
Physical Therapy Evaluation Patient Details Name: Charles [name] MRN: [id number] DOB: 07/18/1935 Today's Date: 08/07/2013 Time: 1410-1436 PT Time Calculation (min): 26 min  PT Assessment / Plan / Recommendation History of Present Illness  Charles [name] is an 78 y.o. 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 Onset of symptoms was 1745 today 08/06/2013. Patient has been taking aspirin and Brilinta. CT scan of his head showed no acute intracranial abnormality. Old right basal ganglia stroke was noted. Patient's speech abnormality significantly improved and and numbness resolved by the time he arrived in the emergency room. NIH stroke score was 3.  Patient was not a TPA candidate secondary to rapidly resolving deficits . He was admitted for further evaluation and treatment.  Clinical Impression  Patient independent with all aspects of mobility. Patient does continued to demonstrate some slurred speech and modest drooping of left side of smile. Patient also had difficulty navigating back to his room despite visual and verbal cues. At this time, patient demonstrates no further acute PT needs. PT will sign off, nsg made aware of cognition and speech concerns.    PT Assessment  Patent does not need any further PT services    Follow Up Recommendations  No PT follow up    Does the patient have the potential to tolerate intense rehabilitation      Barriers to Discharge        Equipment Recommendations  None recommended by PT    Recommendations for Other Services     Frequency      Precautions / Restrictions     Pertinent Vitals/Pain No pain      Mobility  Bed Mobility Bed Mobility: Supine to Sit;Sitting - Scoot to Edge of Bed Supine to Sit: 7: Independent Sitting - Scoot  to [place] of Bed: 7: Independent Transfers Transfers: Sit to Stand;Stand to Sit Sit to Stand: 7: Independent Stand to Sit: 7: Independent Ambulation/Gait Ambulation/Gait Assistance: 7: Independent Ambulation Distance (Feet): 620 Feet Assistive device: None Stairs: Yes Stairs Assistance: 6: Modified independent (Device/Increase time) Stair Management Technique: Two rails;Alternating pattern;Forwards Number of Stairs: 5 Modified Rankin (Stroke Patients Only) Pre-Morbid Rankin Score: No symptoms Modified Rankin: No significant disability    Exercises     PT Diagnosis:    PT Problem List:   PT Treatment Interventions:       PT Goals(Current goals can be found in the care plan section) Acute Rehab PT Goals PT Goal Formulation: No goals set, d/c therapy  Visit Information  Last PT Received On: 08/07/13 Assistance Needed: +1 History of Present Illness: Charles [name] is an 78 y.o. 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 Onset of symptoms was 1745 today 08/06/2013. Patient has been taking aspirin and Brilinta. CT scan of his head showed no acute intracranial abnormality. Old right basal ganglia stroke was noted. Patient's speech abnormality significantly improved and and numbness resolved by the time he arrived in the emergency room. NIH stroke score was 3.  Patient was not a TPA candidate secondary to rapidly resolving deficits . He was admitted for further evaluation and treatment.       Prior Functioning  Home Living Family/patient expects to be discharged to:: Private residence Living Arrangements: Spouse/significant other Available Help at Discharge: Family Home Access: Stairs to enter Secretary/administrator of Steps: 3 Entrance Stairs-Rails: Can reach both Home  Layout: Two level;Able to live on main level with bedroom/bathroom Home Equipment: Gilmer Mor - single point Prior Function Level of Independence: Independent Communication Communication: HOH (glassess all the time, Restricted hearing) Dominant Hand: Right    Cognition  Cognition Arousal/Alertness: Awake/alert Behavior During Therapy: WFL for tasks assessed/performed Overall Cognitive Status: Impaired/Different from baseline Area of Impairment: Problem solving Problem Solving: Slow processing    Extremity/Trunk Assessment Upper Extremity Assessment Upper Extremity Assessment: Defer to OT evaluation Lower Extremity Assessment Lower Extremity Assessment: Overall WFL for tasks assessed   Balance Balance Balance Assessed: Yes Standardized Balance Assessment Standardized Balance Assessment: Dynamic Gait Index Dynamic Gait Index Level Surface: Normal Change in Gait Speed: Normal Gait with Horizontal Head Turns: Normal Gait with Vertical Head Turns: Normal Gait and Pivot Turn: Normal Step Over Obstacle: Normal Step Around Obstacles: Normal Steps: Normal Total Score: 24 High Level Balance High Level Balance Activites: Side stepping;Backward walking;Direction changes;Turns;Sudden stops;Head turns High Level Balance Comments: Independent   End of Session PT - End of Session Equipment Utilized During Treatment: Gait belt Activity Tolerance: Patient tolerated treatment well Patient left: in bed;with call bell/phone within reach;with family/visitor present Nurse Communication: Mobility status  GP Functional Assessment Tool Used: DGI Functional Limitation: Mobility: Walking and moving around Mobility: Walking and Moving Around Current Status (Z6109): 0 percent impaired, limited or restricted Mobility: Walking and Moving Around Goal Status (U0454): 0 percent impaired, limited or restricted Mobility: Walking and Moving Around Discharge Status [phone]): 0 percent impaired, limited or  restricted   Fabio Asa 08/07/2013, 3:07 PM Charles [name], PT DPT  [phone]

## 2013-08-09 ENCOUNTER — Encounter (HOSPITAL_COMMUNITY): Payer: Medicare Other

## 2013-08-12 ENCOUNTER — Encounter (HOSPITAL_COMMUNITY): Payer: Medicare Other

## 2013-08-13 ENCOUNTER — Telehealth: Payer: Self-pay | Admitting: Internal Medicine

## 2013-08-13 NOTE — Telephone Encounter (Signed)
Patient was recently admitted with TIA, I called to check on the patient and at this point symptoms have not returned, is taking the medications as recommended and feels well. He already has an appointment to see me in few days.

## 2013-08-14 ENCOUNTER — Encounter (HOSPITAL_COMMUNITY): Payer: Medicare Other

## 2013-08-16 ENCOUNTER — Encounter (HOSPITAL_COMMUNITY): Payer: Medicare Other

## 2013-08-19 ENCOUNTER — Encounter (HOSPITAL_COMMUNITY): Payer: Medicare Other

## 2013-08-19 ENCOUNTER — Ambulatory Visit (INDEPENDENT_AMBULATORY_CARE_PROVIDER_SITE_OTHER): Payer: Medicare Other | Admitting: Internal Medicine

## 2013-08-19 ENCOUNTER — Encounter: Payer: Self-pay | Admitting: Internal Medicine

## 2013-08-19 VITALS — BP 148/69 | HR 64 | Temp 97.8°F | Wt 182.0 lb

## 2013-08-19 DIAGNOSIS — G459 Transient cerebral ischemic attack, unspecified: Secondary | ICD-10-CM

## 2013-08-19 DIAGNOSIS — N183 Chronic kidney disease, stage 3 unspecified: Secondary | ICD-10-CM

## 2013-08-19 DIAGNOSIS — I10 Essential (primary) hypertension: Secondary | ICD-10-CM

## 2013-08-19 MED ORDER — MOMETASONE FUROATE 0.1 % EX CREA: 1.0000 "application " | TOPICAL_CREAM | Freq: Every day | CUTANEOUS | Status: DC

## 2013-08-19 NOTE — Assessment & Plan Note (Addendum)
Status post admission due toTIA, w/u unrevealing, symptoms have not resurface. He has developed headache, mostly in afternoons, I'm not sure as  of the etiology of the headache. Plan: Arrange a prompt neurology eval.

## 2013-08-19 NOTE — Assessment & Plan Note (Signed)
Slightly elevated today but usually very good, no change

## 2013-08-19 NOTE — Progress Notes (Signed)
Pre visit review using our clinic review tool, if applicable. No additional management support is needed unless otherwise documented below in the visit note. 

## 2013-08-19 NOTE — Assessment & Plan Note (Signed)
Last creatinine 1.5, not far from baseline, no change

## 2013-08-19 NOTE — Progress Notes (Signed)
Subjective:    Patient ID: Charles [name], male    DOB: September 25, 1934, 79 y.o.   MRN: [id number]  HPI Hospital followup, admitted  12-30-4 for 3 days Patient presented to the hospital with slurred speech, R arm-hand weakness, went to the ER; Symptoms resolved after 1 hour aprox.. Head CT on admission was unremarkable, MRI was okay, neurology was consulted. Potassium 4.2, creatinine 1.5 (Not far from baseline), LFTs normal , troponin negative. Carotid ultrasound 39% stenosis on the right and left Echocardiogram -- grade 2 diastolic dysfunction, no hemodynamically significant valv disease, no source of emboli  Medications were not changed   Past Medical History  Diagnosis Date  . Hypertension   . Hyperlipidemia     diet controlled, statins and zetia intolerant  . Retinopathy     mild glaucomea  . Transient ischemic attack 1999  . Histoplasmosis   . Chronic cough 08/2009    -allergy profile- june 2,2011- neg. -Max GERD rx/ off oils January 07 2010 x 3 weeks only but no benefit. - Sinus CT rec July 11,2011- refused due to claustrophobia -MCT neg for asthma 03/01/10 -Add 1st Gen H1 July 11,2011- better  . Macular degeneration   . PONV (postoperative nausea and vomiting)     extreme nausea  . Coronary artery disease     CABG 01-2013, stent 04-2013    . Diabetes mellitus     no meds   Past Surgical History  Procedure Laterality Date  . Cervical spine surgery  2002    plate  . Cholecystectomy  1985  . Coronary artery bypass graft N/A 01/21/2013    Procedure: Coronary Arery Bypass Grafting  Times Five Using Left Internal Mammary Artery and Right Saphenous Leg Vein Harvested Endoscopically;  Surgeon: Melrose Nakayama, MD;  Location: [facility];  Service: Open Heart Surgery;  Laterality: N/A;  . Percutaneous coronary stent intervention (pci-s)  101/31/2014    DrHhARDING   History   Social History  . Marital Status: Married    Spouse Name: N/A    Number of Children: 2  . Years of  Education: N/A   Occupational History  . reitred    Social History Main Topics  . Smoking status: Former Smoker -- 1.00 packs/day for 10 years    Types: Cigarettes    Quit date: 01/17/1977  . Smokeless tobacco: Never Used     Comment: Stopped at age 89   . Alcohol Use: No     Comment: seldom  . Drug Use: No  . Sexual Activity: Not on file   Other Topics Concern  . Not on file   Social History Narrative  . No narrative on file    Review of Systems Since he left the hospital, no further TIA symptoms He has developed a 5/10 headache, right-sided, goes from behind the eye to the right head, usually in the afternoons, decrease when he goes to sleep.  Tylenol seems to help some. Admits to blurred vision but no amaurosis fugax. No seizure activity. Denies neck pain, fever, chills or sinus congestion. Also denies chest pain or palpitations, does have some shortness of breath which is not new. Still has constipation. O prn medications. Has a rash x 4 weeks, 4 spots , few mm in size, red and itchy, better now that he is using calamine and Elocon.    Objective:   Physical Exam BP 148/69  Pulse 64  Temp(Src) 97.8 F (36.6 C)  Wt 182 lb (82.555 kg)  SpO2  99% General -- alert, well-developed, NAD.  HEENT--face symmetric, nose not congested, no rash. Lungs -- normal respiratory effort, no intercostal retractions, no accessory muscle use, and normal breath sounds.  Heart-- normal rate, regular rhythm, no murmur.   Extremities-- no pretibial edema bilaterally  Skin-- at  the left hand has a 3 mm slightly red and scaly lesion Neurologic--  alert & oriented X3. Speech normal, gait normal, strength normal in all extremities.  DTRs symmetric  EOMI, had cataract surgery , pupils seem symmetric and reactive   Psych-- Cognition and judgment appear intact. Cooperative with normal attention span and concentration. No anxious or depressed appearing.      Assessment & Plan:  Rash, unclear  etiology, getting better with,  refill Elocon

## 2013-08-19 NOTE — Patient Instructions (Addendum)
Next visit is for routine check up   in 3 months, fasting Please make an appointment    Tylenol  500 mg OTC 2 tabs a day every 8 hours as needed for pain

## 2013-08-21 ENCOUNTER — Encounter (HOSPITAL_COMMUNITY): Payer: Medicare Other

## 2013-08-21 ENCOUNTER — Telehealth: Payer: Self-pay | Admitting: *Deleted

## 2013-08-21 ENCOUNTER — Encounter: Payer: Self-pay | Admitting: Neurology

## 2013-08-21 ENCOUNTER — Other Ambulatory Visit: Payer: Self-pay | Admitting: *Deleted

## 2013-08-21 ENCOUNTER — Ambulatory Visit (INDEPENDENT_AMBULATORY_CARE_PROVIDER_SITE_OTHER): Payer: Medicare Other | Admitting: Neurology

## 2013-08-21 VITALS — Ht 69.5 in | Wt 184.0 lb

## 2013-08-21 DIAGNOSIS — E1159 Type 2 diabetes mellitus with other circulatory complications: Secondary | ICD-10-CM

## 2013-08-21 DIAGNOSIS — G459 Transient cerebral ischemic attack, unspecified: Secondary | ICD-10-CM

## 2013-08-21 DIAGNOSIS — Z01818 Encounter for other preprocedural examination: Secondary | ICD-10-CM

## 2013-08-21 NOTE — Patient Instructions (Addendum)
I had a long discussion with him with regards to his TIA, risk factors, secondary stroke prevention and answered questions. I recommend increasing aspirin to 325 mg daily. Check TEE for cardiac source of embolus and and if negative may consider recorder placement for paroxysmal atrial fibrillation. Check hemoglobin A1c, fasting lipid profile and transcranial Doppler studies. Return for followup in 2 months or call earlier if necessary

## 2013-08-21 NOTE — Telephone Encounter (Signed)
CALLED HOME NUMBER LEFT MESSAGE.,  CALLED CELL NUMBER - WIFE ANSWERED. SHE A WAS UNABLE TO TALK DUE TO DRIVING.  SHE STATE SHE WILL CALL BACK WHEN SHE GETS HOME.

## 2013-08-21 NOTE — Telephone Encounter (Signed)
DR [place]. STATES PATIENT WAS SEEN BY DR Leonie Man  SCHEDULE A TEE  AND POSSIBLE LOOP RECORDER IMPLANT FOR Friday 08/23/13  VERBAL ORDERED GIVEN , WILL CONTACT PATIENT AND GIVEN INSTRUCTION. NOTIFIED [place]

## 2013-08-21 NOTE — Telephone Encounter (Signed)
sw patient to inform him of time of procedure.  He had numerous questions regarding the procedures and requested a talk to the nurse.  He is schedule for Friday January 16 for TEE and Loop Recorder.  He needs to be at short stay at 11 am.

## 2013-08-21 NOTE — Telephone Encounter (Signed)
Spoke with patient.  HE AND HIS WIFE NEEDED MORE INFORMATION CONCERNING UPCOMING PROCEDURE.  RN ANSWERED BOTH THEIR QUESTION CONCERNING  TEE and  Loop recorder implant.  Patient is aware he will needed to be at [facility] AFTER MIDNIGHT.  PATIENT AND WIFE VERBALIZED UNDERSTANDING

## 2013-08-22 MED ORDER — CEFAZOLIN SODIUM-DEXTROSE 2-3 GM-% IV SOLR: 2.0000 g | INTRAVENOUS | Status: DC

## 2013-08-22 MED ORDER — SODIUM CHLORIDE 0.9 % IV SOLN: INTRAVENOUS | Status: DC

## 2013-08-22 MED ORDER — SODIUM CHLORIDE 0.9 % IR SOLN
80.0000 mg | Status: DC
  Filled 2013-08-22: qty 2

## 2013-08-22 NOTE — Progress Notes (Signed)
[facility] [address] [place]. [place] [address] [phone]       OFFICE CONSULT NOTE  [name] Date of Birth:  09/22/1934 Medical Record Number:  [id number]   Referring MD:  Dr Paz  Reason for Referral:  TIA  HPI: 78 year male with transient episode of expressive speech difficulties , dysarthria and right hand tingling and difficulty using a fork on12/30/14 lasting 30 minutes. No accompanying headache, gait, balance difficulties.He has past h/o TIA in 2000.He was admitted to [facility] and Ct head and MRI showed no acute infarct. Carotid dopplers and 2De echo were normal . He is on aspirin 81 mg and Brillinta for CAD and stents.. He has done well an dnot had recurrentt symptoms. His last HbA1c ws 6.1% and BP has been in 140-150 range though it is elevated at 181/85.  ROS:   14 system review of systems is positive for hearing loss,ringing in ears,shortness of breath,eye pain,headache,memory loss only and all other systems negative  PMH:  Past Medical History  Diagnosis Date  . Hypertension   . Hyperlipidemia     diet controlled, statins and zetia intolerant  . Retinopathy     mild glaucomea  . Transient ischemic attack 1999  . Histoplasmosis   . Chronic cough 08/2009    -allergy profile- june 2,2011- neg. -Max GERD rx/ off oils January 07 2010 x 3 weeks only but no benefit. - Sinus CT rec July 11,2011- refused due to claustrophobia -MCT neg for asthma 03/01/10 -Add 1st Gen H1 July 11,2011- better  . Macular degeneration   . PONV (postoperative nausea and vomiting)     extreme nausea  . Coronary artery disease     CABG 01-2013, stent 04-2013    . Diabetes mellitus     no meds    Social History:  History   Social History  . Marital Status: Married    Spouse Name: Anne    Number of Children: 2  . Years of Education: college   Occupational History  . reitred    Social History Main Topics  . Smoking status: Former Smoker -- 1.00 packs/day for 10  years    Types: Cigarettes    Quit date: 01/17/1977  . Smokeless tobacco: Never Used     Comment: Stopped at age 37   . Alcohol Use: No     Comment: seldom  . Drug Use: No  . Sexual Activity: No   Other Topics Concern  . Not on file   Social History Narrative  . No narrative on file    Medications:   Current Outpatient Prescriptions on File Prior to Visit  Medication Sig Dispense Refill  . aspirin EC 81 MG tablet Take 81 mg by mouth every morning.      . Cholecalciferol (VITAMIN D3) 2000 UNITS TABS Take 2,000 Units by mouth daily.      . Coenzyme Q10 (COQ10) 100 MG CAPS Take 100 mg by mouth daily.      . diphenhydramine-acetaminophen (TYLENOL PM) 25-500 MG TABS Take 1 tablet by mouth at bedtime as needed (for sleep).      . finasteride (PROSCAR) 5 MG tablet Take 5 mg by mouth every morning.       . isosorbide mononitrate (IMDUR) 60 MG 24 hr tablet Take 60 mg by mouth daily.      . KRILL OIL 1000 MG CAPS Take 1,000 mg by mouth daily.       . losartan (COZAAR) 50   MG tablet Take 50 mg by mouth daily.      . metoprolol (LOPRESSOR) 50 MG tablet Take 50 mg by mouth 2 (two) times daily.      . mometasone (ELOCON) 0.1 % cream Apply 1 application topically daily.  45 g  0  . Multiple Vitamins-Minerals (EYE VITAMINS) TABS Take 1 tablet by mouth daily.      . nitroGLYCERIN (NITROSTAT) 0.4 MG SL tablet Place 0.4 mg under the tongue every 5 (five) minutes as needed for chest pain.      . polyethylene glycol (MIRALAX / GLYCOLAX) packet Take 17 g by mouth daily as needed for moderate constipation.      . pravastatin (PRAVACHOL) 40 MG tablet Take 40 mg by mouth at bedtime.       . Ticagrelor (BRILINTA) 90 MG TABS tablet Take 90 mg by mouth 2 (two) times daily.       No current facility-administered medications on file prior to visit.    Allergies:   Allergies  Allergen Reactions  . Percocet [Oxycodone-Acetaminophen] Other (See Comments)    hallucinations  . Atorvastatin Other (See  Comments)    Causes myalgias   . Ezetimibe Other (See Comments)    Causes dizziness  . Lovastatin Other (See Comments)    REACTION: weakness. Patient able to tolerate pravastatin  . Ranexa [Ranolazine] Other (See Comments)    Severe constipation    Physical Exam General: well developed, well nourished, seated, in no evident distress Head: head normocephalic and atraumatic. Orohparynx benign Neck: supple with no carotid or supraclavicular bruits Cardiovascular: regular rate and rhythm, no murmurs Musculoskeletal: no deformity Skin:  no rash/petichiae Vascular:  Normal pulses all extremityies  Neurologic Exam Mental Status: Awake and fully alert. Oriented to place and time. Recent and remote memory intact. Attention span, concentration and fund of knowledge appropriate. Mood and affect appropriate. MMSE 29/30 with 1 deficit in recall Cranial Nerves: Fundoscopic exam reveals sharp disc margins. Pupils equal, briskly reactive to light. Extraocular movements full without nystagmus. Visual fields full to confrontation. Hearing intact. Facial sensation intact. Face, tongue, palate moves normally and symmetrically.  Motor: Normal bulk and tone. Normal strength in all tested extremity muscles. Sensory.: intact to tough and pinprick and vibratory.  Coordination: Rapid alternating movements normal in all extremities. Finger-to-nose and heel-to-shin performed accurately bilaterally. Gait and Station: Arises from chair without difficulty. Stance is normal. Gait demonstrates normal stride length and balance . Able to heel, toe and tandem walk without difficulty.  Reflexes: 1+ and symmetric. Toes downgoing.   NIHSS  0 Modified Rankin  0   ASSESSMENT:  79 year male with left hemispheric TIA in October 1740 likely of embolic etiology without definite source identified.Vascular risk factors of HT, Hyperlipidimia , Diabetes and  CAD   PLAN: I had a long discussion with him with regards to his  TIA, risk factors, secondary stroke prevention and answered questions. I recommend increasing aspirin to 325 mg daily. Check TEE for cardiac source of embolus and and if negative may consider recorder placement for paroxysmal atrial fibrillation. Check hemoglobin A1c, fasting lipid profile and transcranial Doppler studies. Return for followup in 2 months or call earlier if necessa

## 2013-08-23 ENCOUNTER — Ambulatory Visit (HOSPITAL_COMMUNITY)
Admission: RE | Admit: 2013-08-23 | Discharge: 2013-08-23 | Disposition: A | Discharge status: Home / Self Care | Payer: Medicare Other | Source: Ambulatory Visit | Attending: Cardiovascular Disease | Admitting: Cardiovascular Disease

## 2013-08-23 ENCOUNTER — Encounter (HOSPITAL_COMMUNITY): Payer: Medicare Other

## 2013-08-23 ENCOUNTER — Encounter (HOSPITAL_COMMUNITY): Payer: Self-pay | Admitting: *Deleted

## 2013-08-23 ENCOUNTER — Encounter (HOSPITAL_COMMUNITY)
Admission: RE | Disposition: A | Discharge status: Home / Self Care | Payer: Self-pay | Source: Ambulatory Visit | Attending: Cardiovascular Disease

## 2013-08-23 DIAGNOSIS — Z01818 Encounter for other preprocedural examination: Secondary | ICD-10-CM

## 2013-08-23 DIAGNOSIS — I6350 Cerebral infarction due to unspecified occlusion or stenosis of unspecified cerebral artery: Secondary | ICD-10-CM

## 2013-08-23 DIAGNOSIS — H3530 Unspecified macular degeneration: Secondary | ICD-10-CM | POA: Insufficient documentation

## 2013-08-23 DIAGNOSIS — Z7982 Long term (current) use of aspirin: Secondary | ICD-10-CM | POA: Insufficient documentation

## 2013-08-23 DIAGNOSIS — Z7902 Long term (current) use of antithrombotics/antiplatelets: Secondary | ICD-10-CM | POA: Insufficient documentation

## 2013-08-23 DIAGNOSIS — Z8673 Personal history of transient ischemic attack (TIA), and cerebral infarction without residual deficits: Secondary | ICD-10-CM | POA: Insufficient documentation

## 2013-08-23 DIAGNOSIS — Z87891 Personal history of nicotine dependence: Secondary | ICD-10-CM | POA: Insufficient documentation

## 2013-08-23 DIAGNOSIS — I700 Atherosclerosis of aorta: Secondary | ICD-10-CM | POA: Insufficient documentation

## 2013-08-23 DIAGNOSIS — Z9861 Coronary angioplasty status: Secondary | ICD-10-CM | POA: Insufficient documentation

## 2013-08-23 DIAGNOSIS — E119 Type 2 diabetes mellitus without complications: Secondary | ICD-10-CM | POA: Insufficient documentation

## 2013-08-23 DIAGNOSIS — H3500 Unspecified background retinopathy: Secondary | ICD-10-CM | POA: Insufficient documentation

## 2013-08-23 DIAGNOSIS — G459 Transient cerebral ischemic attack, unspecified: Secondary | ICD-10-CM

## 2013-08-23 DIAGNOSIS — E785 Hyperlipidemia, unspecified: Secondary | ICD-10-CM | POA: Insufficient documentation

## 2013-08-23 DIAGNOSIS — I2510 Atherosclerotic heart disease of native coronary artery without angina pectoris: Secondary | ICD-10-CM | POA: Insufficient documentation

## 2013-08-23 DIAGNOSIS — I10 Essential (primary) hypertension: Secondary | ICD-10-CM | POA: Insufficient documentation

## 2013-08-23 HISTORY — PX: TEE WITHOUT CARDIOVERSION: SHX5443

## 2013-08-23 LAB — GLUCOSE, CAPILLARY: GLUCOSE-CAPILLARY: 97 mg/dL (ref 70–99)

## 2013-08-23 SURGERY — LOOP RECORDER IMPLANT
Anesthesia: LOCAL

## 2013-08-23 SURGERY — ECHOCARDIOGRAM, TRANSESOPHAGEAL
Anesthesia: Moderate Sedation

## 2013-08-23 MED ORDER — SODIUM CHLORIDE 0.9 % IV SOLN: INTRAVENOUS | Status: DC

## 2013-08-23 MED ORDER — LIDOCAINE-EPINEPHRINE 1 %-1:100000 IJ SOLN
INTRAMUSCULAR | Status: AC
  Filled 2013-08-23: qty 1

## 2013-08-23 MED ORDER — BUTAMBEN-TETRACAINE-BENZOCAINE 2-2-14 % EX AERO
INHALATION_SPRAY | CUTANEOUS | Status: DC | PRN
  Administered 2013-08-23: 2 via TOPICAL

## 2013-08-23 MED ORDER — MIDAZOLAM HCL 10 MG/2ML IJ SOLN
INTRAMUSCULAR | Status: DC | PRN
  Administered 2013-08-23 (×2): 2 mg via INTRAVENOUS
  Administered 2013-08-23: 1 mg via INTRAVENOUS

## 2013-08-23 MED ORDER — FENTANYL CITRATE 0.05 MG/ML IJ SOLN
INTRAMUSCULAR | Status: DC | PRN
  Administered 2013-08-23 (×2): 25 ug via INTRAVENOUS

## 2013-08-23 MED ORDER — FENTANYL CITRATE 0.05 MG/ML IJ SOLN
INTRAMUSCULAR | Status: AC
  Filled 2013-08-23: qty 2

## 2013-08-23 MED ORDER — METOPROLOL TARTRATE 50 MG PO TABS
50.0000 mg | ORAL_TABLET | Freq: Two times a day (BID) | ORAL | Status: DC
  Administered 2013-08-23: 50 mg via ORAL
  Filled 2013-08-23 (×2): qty 1

## 2013-08-23 MED ORDER — MIDAZOLAM HCL 5 MG/ML IJ SOLN
INTRAMUSCULAR | Status: AC
  Filled 2013-08-23: qty 2

## 2013-08-23 MED ORDER — LOSARTAN POTASSIUM 50 MG PO TABS
50.0000 mg | ORAL_TABLET | Freq: Every day | ORAL | Status: DC
  Administered 2013-08-23: 50 mg via ORAL
  Filled 2013-08-23: qty 1

## 2013-08-23 NOTE — Interval H&P Note (Signed)
History and Physical Interval Note:  08/23/2013 12:11 PM  Charles [name]  has presented today for surgery, with the diagnosis of TIA  The various methods of treatment have been discussed with the patient and family. After consideration of risks, benefits and other options for treatment, the patient has consented to  Procedure(s): TRANSESOPHAGEAL ECHOCARDIOGRAM (TEE) (N/A) and possible IMPLANTABLE LOOP RECORDER as a surgical intervention .  The patient's history has been reviewed, patient examined, no change in status, stable for surgery.  I have reviewed the patient's chart and labs.  Questions were answered to the patient's satisfaction.     Charles [name]

## 2013-08-23 NOTE — CV Procedure (Signed)
INDICATIONS: TIA  PROCEDURE:   Informed consent was obtained prior to the procedure. The risks, benefits and alternatives for the procedure were discussed and the patient comprehended these risks.  Risks include, but are not limited to, cough, sore throat, vomiting, nausea, somnolence, esophageal and stomach trauma or perforation, bleeding, low blood pressure, aspiration, pneumonia, infection, trauma to the teeth and death.    After a procedural time-out, the oropharynx was anesthetized with 20% benzocaine spray. The patient was given 5 mg versed and 50 mcg fentanyl for moderate sedation.   The transesophageal probe was inserted in the esophagus and stomach without difficulty and multiple views were obtained.  The patient was kept under observation until the patient left the procedure room.  The patient left the procedure room in stable condition.   Agitated microbubble saline contrast was administered.  COMPLICATIONS:    There were no immediate complications.  FINDINGS:  No cardiac source of embolism identified. Normal LVEF, no LA clot, no shunt by saline contrast. Mild aortic atherosclerosis.  RECOMMENDATIONS:   Implantable loop recorder for possible occult atrial fibrillation.  Time Spent Directly with the Patient:  45 minutes   Charles [name] 08/23/2013, 12:13 PM

## 2013-08-23 NOTE — H&P (View-Only) (Signed)
[facility] [address] [place]. [place] [address] [phone]       OFFICE CONSULT NOTE  Mr. QUINTEN ALLERTON Date of Birth:  Aug 01, 1935 Medical Record Number:  [id number]   Referring MD:  Dr Larose Kells  Reason for Referral:  TIA  HPI: 79 year male with transient episode of expressive speech difficulties , dysarthria and right hand tingling and difficulty using a fork on12/30/14 lasting 30 minutes. No accompanying headache, gait, balance difficulties.He has past h/o TIA in 2000.He was admitted to [facility] and Ct head and MRI showed no acute infarct. Carotid dopplers and 2De echo were normal . He is on aspirin 81 mg and Brillinta for CAD and stents.. He has done well an dnot had recurrentt symptoms. His last HbA1c ws 6.1% and BP has been in 140-150 range though it is elevated at 181/85.  ROS:   14 system review of systems is positive for hearing loss,ringing in ears,shortness of breath,eye pain,headache,memory loss only and all other systems negative  PMH:  Past Medical History  Diagnosis Date  . Hypertension   . Hyperlipidemia     diet controlled, statins and zetia intolerant  . Retinopathy     mild glaucomea  . Transient ischemic attack 1999  . Histoplasmosis   . Chronic cough 08/2009    -allergy profile- june 2,2011- neg. -Max GERD rx/ off oils January 07 2010 x 3 weeks only but no benefit. - Sinus CT rec July 11,2011- refused due to claustrophobia -MCT neg for asthma 03/01/10 -Add 1st Gen H1 July 11,2011- better  . Macular degeneration   . PONV (postoperative nausea and vomiting)     extreme nausea  . Coronary artery disease     CABG 01-2013, stent 04-2013    . Diabetes mellitus     no meds    Social History:  History   Social History  . Marital Status: Married    Spouse Name: [name]    Number of Children: 2  . Years of Education: college   Occupational History  . reitred    Social History Main Topics  . Smoking status: Former Smoker -- 1.00 packs/day for 10  years    Types: Cigarettes    Quit date: 01/17/1977  . Smokeless tobacco: Never Used     Comment: Stopped at age 80   . Alcohol Use: No     Comment: seldom  . Drug Use: No  . Sexual Activity: No   Other Topics Concern  . Not on file   Social History Narrative  . No narrative on file    Medications:   Current Outpatient Prescriptions on File Prior to Visit  Medication Sig Dispense Refill  . aspirin EC 81 MG tablet Take 81 mg by mouth every morning.      . Cholecalciferol (VITAMIN D3) 2000 UNITS TABS Take 2,000 Units by mouth daily.      . Coenzyme Q10 (COQ10) 100 MG CAPS Take 100 mg by mouth daily.      . diphenhydramine-acetaminophen (TYLENOL PM) 25-500 MG TABS Take 1 tablet by mouth at bedtime as needed (for sleep).      . finasteride (PROSCAR) 5 MG tablet Take 5 mg by mouth every morning.       . isosorbide mononitrate (IMDUR) 60 MG 24 hr tablet Take 60 mg by mouth daily.      Marland Kitchen KRILL OIL 1000 MG CAPS Take 1,000 mg by mouth daily.       Marland Kitchen losartan (COZAAR) 50  MG tablet Take 50 mg by mouth daily.      . metoprolol (LOPRESSOR) 50 MG tablet Take 50 mg by mouth 2 (two) times daily.      . mometasone (ELOCON) 0.1 % cream Apply 1 application topically daily.  45 g  0  . Multiple Vitamins-Minerals (EYE VITAMINS) TABS Take 1 tablet by mouth daily.      . nitroGLYCERIN (NITROSTAT) 0.4 MG SL tablet Place 0.4 mg under the tongue every 5 (five) minutes as needed for chest pain.      . polyethylene glycol (MIRALAX / GLYCOLAX) packet Take 17 g by mouth daily as needed for moderate constipation.      . pravastatin (PRAVACHOL) 40 MG tablet Take 40 mg by mouth at bedtime.       . Ticagrelor (BRILINTA) 90 MG TABS tablet Take 90 mg by mouth 2 (two) times daily.       No current facility-administered medications on file prior to visit.    Allergies:   Allergies  Allergen Reactions  . Percocet [Oxycodone-Acetaminophen] Other (See Comments)    hallucinations  . Atorvastatin Other (See  Comments)    Causes myalgias   . Ezetimibe Other (See Comments)    Causes dizziness  . Lovastatin Other (See Comments)    REACTION: weakness. Patient able to tolerate pravastatin  . Ranexa [Ranolazine] Other (See Comments)    Severe constipation    Physical Exam General: well developed, well nourished, seated, in no evident distress Head: head normocephalic and atraumatic. Orohparynx benign Neck: supple with no carotid or supraclavicular bruits Cardiovascular: regular rate and rhythm, no murmurs Musculoskeletal: no deformity Skin:  no rash/petichiae Vascular:  Normal pulses all extremityies  Neurologic Exam Mental Status: Awake and fully alert. Oriented to place and time. Recent and remote memory intact. Attention span, concentration and fund of knowledge appropriate. Mood and affect appropriate. MMSE 29/30 with 1 deficit in recall Cranial Nerves: Fundoscopic exam reveals sharp disc margins. Pupils equal, briskly reactive to light. Extraocular movements full without nystagmus. Visual fields full to confrontation. Hearing intact. Facial sensation intact. Face, tongue, palate moves normally and symmetrically.  Motor: Normal bulk and tone. Normal strength in all tested extremity muscles. Sensory.: intact to tough and pinprick and vibratory.  Coordination: Rapid alternating movements normal in all extremities. Finger-to-nose and heel-to-shin performed accurately bilaterally. Gait and Station: Arises from chair without difficulty. Stance is normal. Gait demonstrates normal stride length and balance . Able to heel, toe and tandem walk without difficulty.  Reflexes: 1+ and symmetric. Toes downgoing.   NIHSS  0 Modified Rankin  0   ASSESSMENT:  79 year male with left hemispheric TIA in October 1740 likely of embolic etiology without definite source identified.Vascular risk factors of HT, Hyperlipidimia , Diabetes and  CAD   PLAN: I had a long discussion with him with regards to his  TIA, risk factors, secondary stroke prevention and answered questions. I recommend increasing aspirin to 325 mg daily. Check TEE for cardiac source of embolus and and if negative may consider recorder placement for paroxysmal atrial fibrillation. Check hemoglobin A1c, fasting lipid profile and transcranial Doppler studies. Return for followup in 2 months or call earlier if necessa

## 2013-08-23 NOTE — Progress Notes (Signed)
  Echocardiogram Echocardiogram Transesophageal has been performed.  Charles [name] 08/23/2013, 1:02 PM

## 2013-08-23 NOTE — Discharge Instructions (Addendum)
Moderate Sedation, Adult °Moderate sedation is given to help you relax or even sleep through a procedure. You may remain sleepy, be clumsy, or have poor balance for several hours following this procedure. Arrange for a responsible adult, family member, or friend to take you home. A responsible adult should stay with you for at least 24 hours or until the medicines have worn off. °· Do not participate in any activities where you could become injured for the next 24 hours, or until you feel normal again. Do not: °· Drive. °· Swim. °· Ride a bicycle. °· Operate heavy machinery. °· Cook. °· Use power tools. °· Climb ladders. °· Work at heights. °· Do not make important decisions or sign legal documents until you are improved. °· Vomiting may occur if you eat too soon. When you can drink without vomiting, try water, juice, or soup. Try solid foods if you feel little or no nausea. °· Only take over-the-counter or prescription medications for pain, discomfort, or fever as directed by your caregiver.If pain medications have been prescribed for you, ask your caregiver how soon it is safe to take them. °· Make sure you and your family fully understands everything about the medication given to you. Make sure you understand what side effects may occur. °· You should not drink alcohol, take sleeping pills, or medications that cause drowsiness for at least 24 hours. °· If you smoke, do not smoke alone. °· If you are feeling better, you may resume normal activities 24 hours after receiving sedation. °· Keep all appointments as scheduled. Follow all instructions. °· Ask questions if you do not understand. °SEEK MEDICAL CARE IF:  °· Your skin is pale or bluish in color. °· You continue to feel sick to your stomach (nauseous) or throw up (vomit). °· Your pain is getting worse and not helped by medication. °· You have bleeding or swelling. °· You are still sleepy or feeling clumsy after 24 hours. °SEEK IMMEDIATE MEDICAL CARE IF:   °· You develop a rash. °· You have difficulty breathing. °· You develop any type of allergic problem. °· You have a fever. °Document Released: 04/19/2001 Document Revised: 10/17/2011 Document Reviewed: 04/01/2013 °Exit[employer]® Patient Information ©2014 [employer]. ° °

## 2013-08-23 NOTE — CV Procedure (Signed)
LOOP RECORDER IMPLANT   Procedure report  Procedure performed:  1. Loop recorder implantation   Reason for procedure:  1. Cryptogenic  Stroke/TIA Procedure performed by:  Sanda Klein, MD  Complications:  None  Estimated blood loss:  <5 mL  Medications administered during procedure:  LIDOCAINE % local anesthesia 15 mL Device details:  Medtronic Reveal Linq model number [id number], serial number [id number] Procedure details:  After the risks and benefits of the procedure were discussed the patient provided informed consent. He was brought to the cardiac catheter lab in the fasting state. The patient was prepped and draped in usual sterile fashion. Local anesthesia with 1% lidocaine was administered to an area 2 cm to the left of the sternum in the 4th intercostal space. A horizontal incision was made using the incision tool. The introducer was then used to create a subcutaneous tunnel and carefully deploy the device. Local pressure was held to ensure hemostasis.  The incision was closed with SteriStrips and a sterile dressing was applied.   Sanda Klein, MD, [facility] [phone] office [phone] pager 08/23/2013 1:07 PM

## 2013-08-26 ENCOUNTER — Encounter (HOSPITAL_COMMUNITY): Payer: Medicare Other

## 2013-08-26 ENCOUNTER — Encounter (HOSPITAL_COMMUNITY): Payer: Self-pay | Admitting: Cardiovascular Disease

## 2013-08-28 ENCOUNTER — Encounter (HOSPITAL_COMMUNITY): Payer: Medicare Other

## 2013-08-30 ENCOUNTER — Encounter (HOSPITAL_COMMUNITY): Payer: Medicare Other

## 2013-08-30 ENCOUNTER — Other Ambulatory Visit: Payer: Self-pay | Admitting: *Deleted

## 2013-08-30 ENCOUNTER — Ambulatory Visit (INDEPENDENT_AMBULATORY_CARE_PROVIDER_SITE_OTHER): Payer: Medicare Other | Admitting: Physician Assistant

## 2013-08-30 ENCOUNTER — Encounter: Payer: Self-pay | Admitting: Physician Assistant

## 2013-08-30 VITALS — BP 140/80 | HR 60 | Ht 71.0 in | Wt 184.0 lb

## 2013-08-30 DIAGNOSIS — E119 Type 2 diabetes mellitus without complications: Secondary | ICD-10-CM

## 2013-08-30 DIAGNOSIS — I25810 Atherosclerosis of coronary artery bypass graft(s) without angina pectoris: Secondary | ICD-10-CM

## 2013-08-30 DIAGNOSIS — Z955 Presence of coronary angioplasty implant and graft: Secondary | ICD-10-CM

## 2013-08-30 DIAGNOSIS — E785 Hyperlipidemia, unspecified: Secondary | ICD-10-CM

## 2013-08-30 DIAGNOSIS — N183 Chronic kidney disease, stage 3 unspecified: Secondary | ICD-10-CM

## 2013-08-30 DIAGNOSIS — I10 Essential (primary) hypertension: Secondary | ICD-10-CM

## 2013-08-30 DIAGNOSIS — G459 Transient cerebral ischemic attack, unspecified: Secondary | ICD-10-CM

## 2013-08-30 DIAGNOSIS — Z9861 Coronary angioplasty status: Secondary | ICD-10-CM

## 2013-08-30 MED ORDER — ASPIRIN EC 81 MG PO TBEC: 81.0000 mg | DELAYED_RELEASE_TABLET | Freq: Every day | ORAL | Status: DC

## 2013-08-30 NOTE — Patient Instructions (Signed)
1.  Follow up with Dr. Sallyanne Kuster. 6 months

## 2013-08-30 NOTE — Assessment & Plan Note (Signed)
BP mildly elevated.  The patient will bring his BP cuff for comparison the next time he sees a doctor.

## 2013-08-30 NOTE — Assessment & Plan Note (Signed)
The patient appears to be doing well with no complaints of angina.  He is SP loop recorder implant with no signs of infection.  Since having a TIA he is increasing the amount of walking he does slowly in a effort to get back to his previous distances.

## 2013-08-30 NOTE — Progress Notes (Signed)
Date:  08/30/2013   ID:  Charles [name], DOB 23-Dec-1934, MRN [id number]  PCP:  Kathlene November, MD  Primary Cardiologist:  Croitoru    History of Present Illness: Charles [name] is a 79 y.o. male with a history of CAD, CABG x5(left internal mammary artery to left anterior  descending, sequential saphenous vein graft to ramus intermedius and  obtuse marginal 1, sequential saphenous vein graft to distal right coronary and posterior descending), DM, HTN, HLD and recent TIA.  He underwent LHC in Sept. 2014 which revealed occlusion of the sequential SVG to ramus and OM and of the distal limb of the sequential SVG to RCA and PDA.  He then had successful complex bifurcation PTCA/stenting of the Ramus Intermedius using a Promus Premier DES 2.5 mm 24 mm stent crossing the inferior branch requiring rescue PTCA of this branch.   He has residual disease in the posterolateral ventricular branch, difficult to approach secondary to the anatomy. Also has calcified stenosis in a relatively small the circumflex coronary artery. This is felt to difficult to treat since it will require rotablation, is a relatively small vessel with a couple of a 90 angles.  The patient was discharged from [facility] on 08/08/13 after suffering a TIA. He had a TEE on 1/16 which revealed normal EF and wall motion, no thrombus in the AA or cavity and no valvular vegetations.  He also had a loop recorder implanted and presents today for a wound site check.  He has no complaints about the implant site.  The patient currently denies nausea, vomiting, fever, chest pain, shortness of breath, orthopnea, dizziness, PND, cough, congestion, abdominal pain, hematochezia, melena, lower extremity edema, claudication.  Wt Readings from Last 3 Encounters:  08/30/13 184 lb (83.462 kg)  08/21/13 184 lb (83.462 kg)  08/19/13 182 lb (82.555 kg)     Past Medical History  Diagnosis Date  . Hypertension   . Hyperlipidemia     diet controlled, statins  and zetia intolerant  . Retinopathy     mild glaucomea  . Transient ischemic attack 1999  . Histoplasmosis   . Chronic cough 08/2009    -allergy profile- june 2,2011- neg. -Max GERD rx/ off oils January 07 2010 x 3 weeks only but no benefit. - Sinus CT rec July 11,2011- refused due to claustrophobia -MCT neg for asthma 03/01/10 -Add 1st Gen H1 July 11,2011- better  . Macular degeneration   . Coronary artery disease     CABG 01-2013, stent 04-2013    . Diabetes mellitus     no meds  . PONV (postoperative nausea and vomiting)     extreme nausea    Current Outpatient Prescriptions  Medication Sig Dispense Refill  . aspirin 325 MG tablet Take 325 mg by mouth daily.      . Cholecalciferol (VITAMIN D3) 2000 UNITS TABS Take 2,000 Units by mouth daily.      . Coenzyme Q10 (COQ10) 100 MG CAPS Take 100 mg by mouth daily.      . diazepam (VALIUM) 2 MG tablet       . diphenhydramine-acetaminophen (TYLENOL PM) 25-500 MG TABS Take 1 tablet by mouth at bedtime as needed (for sleep).      . finasteride (PROSCAR) 5 MG tablet Take 5 mg by mouth every morning.       . isosorbide mononitrate (IMDUR) 60 MG 24 hr tablet Take 60 mg by mouth daily.      Marland Kitchen KRILL OIL 1000 MG  CAPS Take 1,000 mg by mouth daily.       Marland Kitchen losartan (COZAAR) 50 MG tablet Take 50 mg by mouth daily.      . metoprolol (LOPRESSOR) 50 MG tablet Take 50 mg by mouth 2 (two) times daily.      . mometasone (ELOCON) 0.1 % cream Apply 1 application topically daily.  45 g  0  . Multiple Vitamins-Minerals (EYE VITAMINS) TABS Take 1 tablet by mouth daily.      . nitroGLYCERIN (NITROSTAT) 0.4 MG SL tablet Place 0.4 mg under the tongue every 5 (five) minutes as needed for chest pain.      . polyethylene glycol (MIRALAX / GLYCOLAX) packet Take 17 g by mouth daily as needed for moderate constipation.      . pravastatin (PRAVACHOL) 40 MG tablet Take 40 mg by mouth at bedtime.       . Ticagrelor (BRILINTA) 90 MG TABS tablet Take 90 mg by mouth 2 (two) times  daily.       No current facility-administered medications for this visit.    Allergies:    Allergies  Allergen Reactions  . Percocet [Oxycodone-Acetaminophen] Other (See Comments)    hallucinations  . Atorvastatin Other (See Comments)    Causes myalgias   . Ezetimibe Other (See Comments)    Causes dizziness  . Lovastatin Other (See Comments)    REACTION: weakness. Patient able to tolerate pravastatin  . Ranexa [Ranolazine] Other (See Comments)    Severe constipation    Social History:  The patient  reports that he quit smoking about 36 years ago. His smoking use included Cigarettes. He has a 10 pack-year smoking history. He has never used smokeless tobacco. He reports that he does not drink alcohol or use illicit drugs.   Family history:   Family History  Problem Relation Age of Onset  . Coronary artery disease Neg Hx   . Stroke Neg Hx   . Colon cancer Neg Hx   . Prostate cancer Neg Hx   . Atopy Neg Hx   . Asthma Neg Hx   . Hypertension Mother   . Hypertension Father   . Diabetes Father   . Cancer Father     colon  . Hypertension Brother   . Cancer Brother     lung  . Hypertension Sister   . Cancer Sister     lung  . Diabetes Brother   . Diabetes Sister   . Diabetes      Grandparents   . Lung cancer Brother   . Lung cancer Sister     ROS:  Please see the history of present illness.  All other systems reviewed and negative.   PHYSICAL EXAM: VS:  BP 140/80  Pulse 60  Ht 5\' 11"  (1.803 m)  Wt 184 lb (83.462 kg)  BMI 25.67 kg/m2 Well nourished, well developed, in no acute distress HEENT: Pupils are equal round react to light accommodation extraocular movements are intact.  Cardiac: Regular rate and rhythm without murmurs rubs or gallops. Lungs:  clear to auscultation bilaterally, no wheezing, rhonchi or rales Ext: no lower extremity edema.  2+ radial and dorsalis pedis pulses. Skin: warm and dry.  LR implant site:  No erythema, tenderness or edema.  Mild  ecchymosis. Neuro:  Grossly normal  ASSESSMENT AND PLAN:  Problem List Items Addressed This Visit   DIABETES MELLITUS, TYPE II (Chronic)     Last A1C 6.2    Relevant Medications  aspirin 325 MG tablet   HYPERLIPIDEMIA (Chronic)   HYPERTENSION - Primary (Chronic)     BP mildly elevated.  The patient will bring his BP cuff for comparison the next time he sees a doctor.    CAD- S/P CABG x 5 (LIMA-LAD, SVG-RI-OM1, SVG to distal RCA, SVG-PDA) 01/21/13 (Chronic)     The patient appears to be doing well with no complaints of angina.  He is SP loop recorder implant with no signs of infection.  Since having a TIA he is increasing the amount of walking he does slowly in a effort to get back to his previous distances.      Chronic renal insufficiency, stage III (moderate) (Chronic)   S/P SVG-RI/OM DES 05/06/13- staged DES to RCA 06/07/13   TIA (transient ischemic attack)

## 2013-08-30 NOTE — Assessment & Plan Note (Signed)
Last A1C 6.2

## 2013-09-02 ENCOUNTER — Encounter (HOSPITAL_COMMUNITY): Payer: Medicare Other

## 2013-09-04 ENCOUNTER — Encounter (HOSPITAL_COMMUNITY): Payer: Medicare Other

## 2013-09-05 ENCOUNTER — Encounter: Payer: Self-pay | Admitting: Internal Medicine

## 2013-09-06 ENCOUNTER — Encounter (HOSPITAL_COMMUNITY): Payer: Medicare Other

## 2013-09-09 ENCOUNTER — Encounter (HOSPITAL_COMMUNITY): Payer: Medicare Other

## 2013-09-11 ENCOUNTER — Encounter (HOSPITAL_COMMUNITY): Payer: Medicare Other

## 2013-09-12 ENCOUNTER — Ambulatory Visit (INDEPENDENT_AMBULATORY_CARE_PROVIDER_SITE_OTHER): Payer: Medicare Other | Admitting: Internal Medicine

## 2013-09-12 ENCOUNTER — Encounter: Payer: Self-pay | Admitting: Internal Medicine

## 2013-09-12 VITALS — BP 120/80 | HR 63 | Temp 97.7°F | Wt 185.0 lb

## 2013-09-12 DIAGNOSIS — R195 Other fecal abnormalities: Secondary | ICD-10-CM

## 2013-09-12 DIAGNOSIS — K921 Melena: Secondary | ICD-10-CM

## 2013-09-12 DIAGNOSIS — I25810 Atherosclerosis of coronary artery bypass graft(s) without angina pectoris: Secondary | ICD-10-CM

## 2013-09-12 NOTE — Progress Notes (Signed)
Subjective:    Patient ID: Charles [name], male    DOB: 06/04/1935, 79 y.o.   MRN: [id number]  HPI Acute visit For the last 5 day he has seen black stools, denies any fresh blood per rectum, denies feeling weak or orthostatically dizzy. Yesterday, he was at the [facility] and for the first time in several weeks he developed chest pain with exertion, he stopped exercising in the chest pain went quickly away. Did not have time to take NTG    Past Medical History  Diagnosis Date  . Hypertension   . Hyperlipidemia     diet controlled, statins and zetia intolerant  . Retinopathy     mild glaucomea  . Transient ischemic attack 1999  . Histoplasmosis   . Chronic cough 08/2009    -allergy profile- june 2,2011- neg. -Max GERD rx/ off oils January 07 2010 x 3 weeks only but no benefit. - Sinus CT rec July 11,2011- refused due to claustrophobia -MCT neg for asthma 03/01/10 -Add 1st Gen H1 July 11,2011- better  . Macular degeneration   . Coronary artery disease     CABG 01-2013, stent 04-2013    . Diabetes mellitus     no meds  . PONV (postoperative nausea and vomiting)     extreme nausea   Past Surgical History  Procedure Laterality Date  . Cervical spine surgery  2002    plate  . Cholecystectomy  1985  . Percutaneous coronary stent intervention (pci-s)  101/31/2014    DrHhARDING  . Coronary artery bypass graft N/A 01/21/2013    Procedure: Coronary Arery Bypass Grafting  Times Five Using Left Internal Mammary Artery and Right Saphenous Leg Vein Harvested Endoscopically;  Surgeon: Melrose Nakayama, MD;  Location: [facility];  Service: Open Heart Surgery;  Laterality: N/A;  . Tee without cardioversion N/A 08/23/2013    Procedure: TRANSESOPHAGEAL ECHOCARDIOGRAM (TEE);  Surgeon: Sanda Klein, MD;  Location: [facility];  Service: Cardiovascular;  Laterality: N/A;     Review of Systems Denies fever or chills No  nausea, vomiting, diarrhea, stool softener month. No abdominal pain, not taking any  Motrin or Motrin-like medications.    Objective:   Physical Exam General -- alert, well-developed, NAD.  HEENT-- actually not pale Lungs -- normal respiratory effort, no intercostal retractions, no accessory muscle use, and normal breath sounds.  Heart-- normal rate, regular rhythm, no murmur.  Abdomen-- Not distended, good bowel sounds,soft, non-tender. No rebound or rigidity. No mass,organomegaly. Rectal-- No external abnormalities noted. Normal sphincter tone. No rectal masses or tenderness. Small amount of black stools, not tarry, ++ hemocult Extremities-- no pretibial edema bilaterally  Neurologic--  alert & oriented X3. Speech normal, gait normal, strength normal in all extremities.  Psych-- Cognition and judgment appear intact. Cooperative with normal attention span and concentration. No anxious or depressed appearing.      Assessment & Plan:  Blood in the stools 79 year old gentleman with his to CAD,   stroke, on aspirin and Brillanta who presents with a 5 days history of dark stools, on exam he is Hemoccult positive. He also had episode of chest pain that quickly resolve with rest, EKG today nsr BPs slt low today but we did orthostatic vital signs : Lying down 150/70, sitting 1 30/80 standing 120/80   My advice that he needs to be admitted to the hospital, he is high risk for complications including severe bleeding, demand ischemia, et Ronney Asters. Risk of death discussed  PT DECLINED, THUS LABS ER IF  SX SEVERE, WEAK DIZZY ASK GI TO SEE ASAP  ------ Addendum: CBC stable, discussed case with patient's cardiologist, appreciate his input.  Stent is 25 months old , given high likelihood of  bleeding, is appropriate to stop [place]. I spoke with the patient, he is in agreement, will continue with aspirin, increased risk of stent occlusion discussed as well. If he changes his mind, he needs to go to the ER this weekend to be eval. Also go to  the ER if chest pain, dizziness,  weakness, increased symptoms (although he mentioned  his stools are more normal today) Still working on the GI referral JP 09-13-13 @ 3.30 pm

## 2013-09-12 NOTE — Patient Instructions (Signed)
Get your blood work before you leave  Needs CBC iron ferritin  Go to the ER immediately if chest pain, weak, dizzy, abdominal pain

## 2013-09-12 NOTE — Progress Notes (Signed)
Pre visit review using our clinic review tool, if applicable. No additional management support is needed unless otherwise documented below in the visit note. 

## 2013-09-13 ENCOUNTER — Ambulatory Visit (INDEPENDENT_AMBULATORY_CARE_PROVIDER_SITE_OTHER): Payer: Medicare Other

## 2013-09-13 ENCOUNTER — Encounter (HOSPITAL_COMMUNITY): Payer: Medicare Other

## 2013-09-13 ENCOUNTER — Encounter: Payer: Self-pay | Admitting: Internal Medicine

## 2013-09-13 DIAGNOSIS — I6350 Cerebral infarction due to unspecified occlusion or stenosis of unspecified cerebral artery: Secondary | ICD-10-CM

## 2013-09-13 DIAGNOSIS — E1159 Type 2 diabetes mellitus with other circulatory complications: Secondary | ICD-10-CM

## 2013-09-13 DIAGNOSIS — G459 Transient cerebral ischemic attack, unspecified: Secondary | ICD-10-CM

## 2013-09-13 LAB — CBC WITH DIFFERENTIAL/PLATELET
BASOS PCT: 0.2 % (ref 0.0–3.0)
Basophils Absolute: 0 10*3/uL (ref 0.0–0.1)
EOS ABS: 0.2 10*3/uL (ref 0.0–0.7)
EOS PCT: 2.3 % (ref 0.0–5.0)
HEMATOCRIT: 41.2 % (ref 39.0–52.0)
Hemoglobin: 13.4 g/dL (ref 13.0–17.0)
Lymphocytes Relative: 29 % (ref 12.0–46.0)
Lymphs Abs: 2.2 10*3/uL (ref 0.7–4.0)
MCHC: 32.6 g/dL (ref 30.0–36.0)
MCV: 94.6 fl (ref 78.0–100.0)
Monocytes Absolute: 0.1 10*3/uL (ref 0.1–1.0)
Monocytes Relative: 1 % — ABNORMAL LOW (ref 3.0–12.0)
NEUTROS ABS: 5 10*3/uL (ref 1.4–7.7)
Neutrophils Relative %: 67.5 % (ref 43.0–77.0)
Platelets: 242 10*3/uL (ref 150.0–400.0)
RBC: 4.35 Mil/uL (ref 4.22–5.81)
RDW: 14.8 % — ABNORMAL HIGH (ref 11.5–14.6)
WBC: 7.4 10*3/uL (ref 4.5–10.5)

## 2013-09-13 LAB — FERRITIN: Ferritin: 49.2 ng/mL (ref 22.0–322.0)

## 2013-09-16 ENCOUNTER — Encounter (HOSPITAL_COMMUNITY): Payer: Medicare Other

## 2013-09-18 ENCOUNTER — Encounter (HOSPITAL_COMMUNITY): Payer: Medicare Other

## 2013-09-20 ENCOUNTER — Encounter (HOSPITAL_COMMUNITY): Payer: Medicare Other

## 2013-09-23 ENCOUNTER — Encounter (HOSPITAL_COMMUNITY): Payer: Medicare Other

## 2013-09-25 ENCOUNTER — Encounter (HOSPITAL_COMMUNITY): Payer: Medicare Other

## 2013-09-25 ENCOUNTER — Ambulatory Visit (INDEPENDENT_AMBULATORY_CARE_PROVIDER_SITE_OTHER): Payer: Medicare Other | Admitting: *Deleted

## 2013-09-25 DIAGNOSIS — I6350 Cerebral infarction due to unspecified occlusion or stenosis of unspecified cerebral artery: Secondary | ICD-10-CM

## 2013-09-25 DIAGNOSIS — I639 Cerebral infarction, unspecified: Secondary | ICD-10-CM

## 2013-09-25 LAB — PACEMAKER DEVICE OBSERVATION

## 2013-09-27 ENCOUNTER — Encounter (HOSPITAL_COMMUNITY): Payer: Medicare Other

## 2013-09-30 ENCOUNTER — Other Ambulatory Visit: Payer: Self-pay | Admitting: Gastroenterology

## 2013-09-30 ENCOUNTER — Encounter (HOSPITAL_COMMUNITY): Payer: Medicare Other

## 2013-09-30 NOTE — Addendum Note (Signed)
Addended by: Vernie Ammons. on: 09/30/2013 11:28 AM   Modules accepted: Orders

## 2013-10-02 ENCOUNTER — Encounter (HOSPITAL_COMMUNITY): Payer: Medicare Other

## 2013-10-03 ENCOUNTER — Other Ambulatory Visit: Payer: Self-pay | Admitting: Cardiovascular Disease

## 2013-10-03 NOTE — Telephone Encounter (Signed)
Rx was sent to pharmacy electronically. 

## 2013-10-04 ENCOUNTER — Encounter (HOSPITAL_COMMUNITY): Payer: Medicare Other

## 2013-10-04 ENCOUNTER — Other Ambulatory Visit: Payer: Self-pay | Admitting: *Deleted

## 2013-10-04 ENCOUNTER — Telehealth: Payer: Self-pay | Admitting: Cardiovascular Disease

## 2013-10-04 MED ORDER — PRAVASTATIN SODIUM 40 MG PO TABS: ORAL_TABLET | ORAL | Status: DC

## 2013-10-04 NOTE — Telephone Encounter (Signed)
Patient wanted a 90-day supply of medication. Called into pharmacy.

## 2013-10-16 ENCOUNTER — Other Ambulatory Visit: Payer: Self-pay | Admitting: Gastroenterology

## 2013-10-16 NOTE — Addendum Note (Signed)
Addended by: Vernie Ammons. on: 10/16/2013 01:09 PM   Modules accepted: Orders

## 2013-10-17 ENCOUNTER — Encounter (HOSPITAL_COMMUNITY): Payer: Self-pay | Admitting: *Deleted

## 2013-10-17 ENCOUNTER — Encounter (HOSPITAL_COMMUNITY)
Admission: RE | Disposition: A | Discharge status: Home / Self Care | Payer: Self-pay | Source: Ambulatory Visit | Attending: Gastroenterology

## 2013-10-17 ENCOUNTER — Ambulatory Visit (HOSPITAL_COMMUNITY)
Admission: RE | Admit: 2013-10-17 | Discharge: 2013-10-17 | Disposition: A | Discharge status: Home / Self Care | Payer: Medicare Other | Source: Ambulatory Visit | Attending: Gastroenterology | Admitting: Gastroenterology

## 2013-10-17 DIAGNOSIS — K921 Melena: Secondary | ICD-10-CM | POA: Insufficient documentation

## 2013-10-17 DIAGNOSIS — Z9089 Acquired absence of other organs: Secondary | ICD-10-CM | POA: Insufficient documentation

## 2013-10-17 DIAGNOSIS — F411 Generalized anxiety disorder: Secondary | ICD-10-CM | POA: Insufficient documentation

## 2013-10-17 DIAGNOSIS — E119 Type 2 diabetes mellitus without complications: Secondary | ICD-10-CM | POA: Insufficient documentation

## 2013-10-17 DIAGNOSIS — E785 Hyperlipidemia, unspecified: Secondary | ICD-10-CM | POA: Insufficient documentation

## 2013-10-17 DIAGNOSIS — Z8673 Personal history of transient ischemic attack (TIA), and cerebral infarction without residual deficits: Secondary | ICD-10-CM | POA: Insufficient documentation

## 2013-10-17 DIAGNOSIS — N183 Chronic kidney disease, stage 3 unspecified: Secondary | ICD-10-CM | POA: Insufficient documentation

## 2013-10-17 DIAGNOSIS — K5900 Constipation, unspecified: Secondary | ICD-10-CM | POA: Insufficient documentation

## 2013-10-17 DIAGNOSIS — I129 Hypertensive chronic kidney disease with stage 1 through stage 4 chronic kidney disease, or unspecified chronic kidney disease: Secondary | ICD-10-CM | POA: Insufficient documentation

## 2013-10-17 DIAGNOSIS — H409 Unspecified glaucoma: Secondary | ICD-10-CM | POA: Insufficient documentation

## 2013-10-17 DIAGNOSIS — R011 Cardiac murmur, unspecified: Secondary | ICD-10-CM | POA: Insufficient documentation

## 2013-10-17 DIAGNOSIS — Z951 Presence of aortocoronary bypass graft: Secondary | ICD-10-CM | POA: Insufficient documentation

## 2013-10-17 DIAGNOSIS — K319 Disease of stomach and duodenum, unspecified: Secondary | ICD-10-CM | POA: Insufficient documentation

## 2013-10-17 DIAGNOSIS — Z87891 Personal history of nicotine dependence: Secondary | ICD-10-CM | POA: Insufficient documentation

## 2013-10-17 DIAGNOSIS — Z7982 Long term (current) use of aspirin: Secondary | ICD-10-CM | POA: Insufficient documentation

## 2013-10-17 DIAGNOSIS — Z79899 Other long term (current) drug therapy: Secondary | ICD-10-CM | POA: Insufficient documentation

## 2013-10-17 DIAGNOSIS — Z7902 Long term (current) use of antithrombotics/antiplatelets: Secondary | ICD-10-CM | POA: Insufficient documentation

## 2013-10-17 HISTORY — DX: Cardiac murmur, unspecified: R01.1

## 2013-10-17 HISTORY — PX: ESOPHAGOGASTRODUODENOSCOPY: SHX5428

## 2013-10-17 LAB — CBC WITH DIFFERENTIAL/PLATELET
BASOS ABS: 0 10*3/uL (ref 0.0–0.1)
BASOS PCT: 0 % (ref 0–1)
Eosinophils Absolute: 0.2 10*3/uL (ref 0.0–0.7)
Eosinophils Relative: 2 % (ref 0–5)
HEMATOCRIT: 41.3 % (ref 39.0–52.0)
HEMOGLOBIN: 13.9 g/dL (ref 13.0–17.0)
Lymphocytes Relative: 36 % (ref 12–46)
Lymphs Abs: 2.6 10*3/uL (ref 0.7–4.0)
MCH: 31.4 pg (ref 26.0–34.0)
MCHC: 33.7 g/dL (ref 30.0–36.0)
MCV: 93.4 fL (ref 78.0–100.0)
MONO ABS: 0.6 10*3/uL (ref 0.1–1.0)
MONOS PCT: 8 % (ref 3–12)
Neutro Abs: 3.9 10*3/uL (ref 1.7–7.7)
Neutrophils Relative %: 53 % (ref 43–77)
Platelets: 207 10*3/uL (ref 150–400)
RBC: 4.42 MIL/uL (ref 4.22–5.81)
RDW: 13.4 % (ref 11.5–15.5)
WBC: 7.3 10*3/uL (ref 4.0–10.5)

## 2013-10-17 LAB — COMPREHENSIVE METABOLIC PANEL
ALBUMIN: 3.6 g/dL (ref 3.5–5.2)
ALT: 17 U/L (ref 0–53)
AST: 21 U/L (ref 0–37)
Alkaline Phosphatase: 60 U/L (ref 39–117)
BUN: 29 mg/dL — AB (ref 6–23)
CO2: 27 mEq/L (ref 19–32)
CREATININE: 1.46 mg/dL — AB (ref 0.50–1.35)
Calcium: 9.3 mg/dL (ref 8.4–10.5)
Chloride: 107 mEq/L (ref 96–112)
GFR calc Af Amer: 52 mL/min — ABNORMAL LOW (ref >90)
GFR calc non Af Amer: 45 mL/min — ABNORMAL LOW (ref >90)
Glucose, Bld: 151 mg/dL — ABNORMAL HIGH (ref 70–99)
Potassium: 4.4 mEq/L (ref 3.7–5.3)
Sodium: 145 mEq/L (ref 137–147)
TOTAL PROTEIN: 6.6 g/dL (ref 6.0–8.3)
Total Bilirubin: 0.7 mg/dL (ref 0.3–1.2)

## 2013-10-17 LAB — GLUCOSE, CAPILLARY: Glucose-Capillary: 114 mg/dL — ABNORMAL HIGH (ref 70–99)

## 2013-10-17 SURGERY — EGD (ESOPHAGOGASTRODUODENOSCOPY)
Anesthesia: Moderate Sedation

## 2013-10-17 MED ORDER — SODIUM CHLORIDE 0.9 % IV SOLN: INTRAVENOUS | Status: DC

## 2013-10-17 MED ORDER — FENTANYL CITRATE 0.05 MG/ML IJ SOLN
INTRAMUSCULAR | Status: DC | PRN
  Administered 2013-10-17 (×2): 25 ug via INTRAVENOUS

## 2013-10-17 MED ORDER — DIPHENHYDRAMINE HCL 50 MG/ML IJ SOLN
INTRAMUSCULAR | Status: AC
  Filled 2013-10-17: qty 1

## 2013-10-17 MED ORDER — BUTAMBEN-TETRACAINE-BENZOCAINE 2-2-14 % EX AERO
INHALATION_SPRAY | CUTANEOUS | Status: DC | PRN
  Administered 2013-10-17: 2 via TOPICAL

## 2013-10-17 MED ORDER — MIDAZOLAM HCL 10 MG/2ML IJ SOLN
INTRAMUSCULAR | Status: AC
  Filled 2013-10-17: qty 2

## 2013-10-17 MED ORDER — FENTANYL CITRATE 0.05 MG/ML IJ SOLN
INTRAMUSCULAR | Status: AC
  Filled 2013-10-17: qty 2

## 2013-10-17 MED ORDER — MIDAZOLAM HCL 10 MG/2ML IJ SOLN
INTRAMUSCULAR | Status: DC | PRN
  Administered 2013-10-17 (×2): 2 mg via INTRAVENOUS

## 2013-10-17 NOTE — Op Note (Signed)
[facility] [place], [address]   ENDOSCOPY PROCEDURE REPORT  PATIENT: Charles, [name]  MR#: [id number] BIRTHDATE: 09-12-1934 , 78  yrs. old GENDER: Male ENDOSCOPIST:Torey Reinard Oletta Lamas, MD REFERRED BY:  Dr. Larose Kells PROCEDURE DATE:  10/17/2013 PROCEDURE:   EGD With Biopsy ASA CLASS:  class III INDICATIONS:   heme positive stool and gentlemen he does get regular colonoscopies. He had been on antiplatelet agents following TIA. Stools did clear up in antiplatelet agents were restarted. EGD done to rule out ulcer or other lesion. No history of chronic heartburn. MEDICATION:   fentanyl 50 mcg, versed 4 mg IV TOPICAL ANESTHETIC:   cetacaine spray  DESCRIPTION OF PROCEDURE:   The Pentax upper endoscope inserted into the esophagus with swallowing without difficulty. The esophagus was normal. Slight irritation initially seen EGD junction. Scope passed into duodenum. Duodenum normal and 2nd portion and duodenal bulb. Scope with drawn back into the stomach. Stomach examined in the forward and retroflex view. The Guardian the stomach was normal but ulcerations seen at GE junction. Scope with drawn back to the GE junction in the patient was seen to have a large posterior ulcerated mass that was quite probable and hard right at the GE junction. It was difficult to tell is disseminated from the gastric side or from the esophageal side. Multiple biopsies were obtained. It was hard and probable. The scope is withdrawn. The patient tolerated procedure well.     COMPLICATIONS: None  ENDOSCOPIC IMPRESSION: 1. Ulcerated Mass at [name]. This is very suspicious for malignancy.  RECOMMENDATIONS: 1. we will begin the patient on Nexium 2. We will arrange CT scan of the abdomen and chest. This can be done in the next few days as an outpatient. 3. We will obtain cmet and CBC here today.    _______________________________ Rogue Bussing, MD 10/17/2013 9:29  AM  CC Dr Larose Kells, Dr Croitoru   PATIENT NAME:  Charles, [name] MR#: [id number]

## 2013-10-17 NOTE — H&P (Signed)
Subjective:   Patient is a 79 y.o. male presents with recent G.I. bleeding. This was basically dark stools are rapidly cleared. The stools were brown and were negative in the office. Colonoscopy is up to date. He does need to be on antiplatelet agents cause of recent TIA and CABG. EGD was performed to rule out gross lesion in the upper G.I. track. Procedure including risks and benefits discussed with the patient in the office.   Patient Active Problem List   Diagnosis Date Noted  . CVA (cerebral infarction) 08/06/2013  . TIA (transient ischemic attack) 08/06/2013  . H/O: CVA (cerebrovascular accident) 08/06/2013  . Unspecified constipation 06/14/2013  . S/P SVG-RI/OM DES 05/06/13- staged DES to RCA 06/07/13 06/08/2013  . Chronic renal insufficiency, stage III (moderate) 06/08/2013  . RBBB 06/08/2013  . Angina, class III 06/07/2013  . Anxiety state, unspecified 05/14/2013  . CAD- S/P CABG x 5 (LIMA-LAD, SVG-RI-OM1, SVG to distal RCA, SVG-PDA) 01/21/13 01/23/2013  . Left main coronary artery disease 01/16/2013  . Abnormal nuclear cardiac imaging test - Inferior Ischemia 01/07/2013  . Yellow jacket sting 04/20/2012  . Annual physical exam 02/17/2012  . Fatigue 01/13/2012  . Onychomycosis 11/29/2010  . GLAUCOMA 12/16/2009  . DIABETES MELLITUS, TYPE II 06/07/2007  . INSOMNIA-SLEEP DISORDER-UNSPEC 05/07/2007  . HYPERLIPIDEMIA 03/01/2007  . HYPERTENSION 03/01/2007   Past Medical History  Diagnosis Date  . Hypertension   . Hyperlipidemia     diet controlled, statins and zetia intolerant  . Retinopathy     mild glaucomea  . Transient ischemic attack 1999  . Histoplasmosis   . Chronic cough 08/2009    -allergy profile- june 2,2011- neg. -Max GERD rx/ off oils January 07 2010 x 3 weeks only but no benefit. - Sinus CT rec July 11,2011- refused due to claustrophobia -MCT neg for asthma 03/01/10 -Add 1st Gen H1 July 11,2011- better  . Macular degeneration   . Coronary artery disease     CABG  01-2013, stent 04-2013    . Diabetes mellitus     no meds  . PONV (postoperative nausea and vomiting)     extreme nausea  . Heart murmur     TIA    Past Surgical History  Procedure Laterality Date  . Cervical spine surgery  2002    plate  . Cholecystectomy  1985  . Percutaneous coronary stent intervention (pci-s)  101/31/2014    DrHhARDING  . Coronary artery bypass graft N/A 01/21/2013    Procedure: Coronary Arery Bypass Grafting  Times Five Using Left Internal Mammary Artery and Right Saphenous Leg Vein Harvested Endoscopically;  Surgeon: Melrose Nakayama, MD;  Location: [facility];  Service: Open Heart Surgery;  Laterality: N/A;  . Tee without cardioversion N/A 08/23/2013    Procedure: TRANSESOPHAGEAL ECHOCARDIOGRAM (TEE);  Surgeon: Sanda Klein, MD;  Location: [facility]COPY;  Service: Cardiovascular;  Laterality: N/A;    Prescriptions prior to admission  Medication Sig Dispense Refill  . aspirin EC 81 MG tablet Take 1 tablet (81 mg total) by mouth daily.  90 tablet  3  . Cholecalciferol (VITAMIN D3) 2000 UNITS TABS Take 2,000 Units by mouth daily.      . Coenzyme Q10 (COQ10) 100 MG CAPS Take 100 mg by mouth daily.      . diazepam (VALIUM) 2 MG tablet       . diphenhydramine-acetaminophen (TYLENOL PM) 25-500 MG TABS Take 1 tablet by mouth at bedtime as needed (for sleep).      . finasteride (PROSCAR)  5 MG tablet Take 5 mg by mouth every morning.       . isosorbide mononitrate (IMDUR) 60 MG 24 hr tablet Take 60 mg by mouth daily.      Marland Kitchen KRILL OIL 1000 MG CAPS Take 1,000 mg by mouth daily.       Marland Kitchen losartan (COZAAR) 50 MG tablet Take 50 mg by mouth daily.      . metoprolol (LOPRESSOR) 50 MG tablet Take 50 mg by mouth 2 (two) times daily.      . mometasone (ELOCON) 0.1 % cream Apply 1 application topically daily.  45 g  0  . Multiple Vitamins-Minerals (EYE VITAMINS) TABS Take 1 tablet by mouth daily.      . polyethylene glycol (MIRALAX / GLYCOLAX) packet Take 17 g by mouth daily as needed  for moderate constipation.      . pravastatin (PRAVACHOL) 40 MG tablet TAKE 1 TABLET BY MOUTH EVERY EVENING  90 tablet  3  . Ticagrelor (BRILINTA) 90 MG TABS tablet Take by mouth 2 (two) times daily.      . nitroGLYCERIN (NITROSTAT) 0.4 MG SL tablet Place 0.4 mg under the tongue every 5 (five) minutes as needed for chest pain.       Allergies  Allergen Reactions  . Percocet [Oxycodone-Acetaminophen] Other (See Comments)    hallucinations  . Atorvastatin Other (See Comments)    Causes myalgias   . Ezetimibe Other (See Comments)    Causes dizziness  . Lovastatin Other (See Comments)    REACTION: weakness. Patient able to tolerate pravastatin  . Ranexa [Ranolazine] Other (See Comments)    Severe constipation    History  Substance Use Topics  . Smoking status: Former Smoker -- 1.00 packs/day for 10 years    Types: Cigarettes    Quit date: 01/17/1977  . Smokeless tobacco: Never Used     Comment: Stopped at age 40   . Alcohol Use: No     Comment: seldom    Family History  Problem Relation Age of Onset  . Coronary artery disease Neg Hx   . Stroke Neg Hx   . Colon cancer Neg Hx   . Prostate cancer Neg Hx   . Atopy Neg Hx   . Asthma Neg Hx   . Hypertension Mother   . Hypertension Father   . Diabetes Father   . Cancer Father     colon  . Hypertension Brother   . Cancer Brother     lung  . Hypertension Sister   . Cancer Sister     lung  . Diabetes Brother   . Diabetes Sister   . Diabetes      Grandparents   . Lung cancer Brother   . Lung cancer Sister      Objective:   Patient Vitals for the past 8 hrs:  BP Temp Temp src Resp SpO2 Height Weight  10/17/13 0813 189/81 mmHg 97.8 F (36.6 C) Oral 13 99 % 5\' 11"  (1.803 m) 81.647 kg (180 lb)         See MD Preop evaluation      Assessment:   1.GI Bleed  Plan:   EGD today.

## 2013-10-17 NOTE — Discharge Instructions (Signed)
Nexium 40 mg 1 tablet daily We will schedule CT of abd and chest before leaving the hospital today

## 2013-10-18 ENCOUNTER — Ambulatory Visit (HOSPITAL_COMMUNITY)
Admission: RE | Admit: 2013-10-18 | Discharge: 2013-10-18 | Disposition: A | Discharge status: Home / Self Care | Payer: Medicare Other | Source: Ambulatory Visit | Attending: Gastroenterology | Admitting: Gastroenterology

## 2013-10-18 ENCOUNTER — Encounter (HOSPITAL_COMMUNITY): Payer: Self-pay | Admitting: Gastroenterology

## 2013-10-18 ENCOUNTER — Ambulatory Visit (HOSPITAL_COMMUNITY): Payer: Medicare Other

## 2013-10-18 ENCOUNTER — Other Ambulatory Visit (HOSPITAL_COMMUNITY): Payer: Self-pay | Admitting: Gastroenterology

## 2013-10-18 DIAGNOSIS — Z951 Presence of aortocoronary bypass graft: Secondary | ICD-10-CM | POA: Insufficient documentation

## 2013-10-18 DIAGNOSIS — K3189 Other diseases of stomach and duodenum: Secondary | ICD-10-CM

## 2013-10-18 DIAGNOSIS — K319 Disease of stomach and duodenum, unspecified: Secondary | ICD-10-CM | POA: Insufficient documentation

## 2013-10-18 DIAGNOSIS — C799 Secondary malignant neoplasm of unspecified site: Secondary | ICD-10-CM

## 2013-10-18 DIAGNOSIS — I700 Atherosclerosis of aorta: Secondary | ICD-10-CM | POA: Insufficient documentation

## 2013-10-18 DIAGNOSIS — J8410 Pulmonary fibrosis, unspecified: Secondary | ICD-10-CM | POA: Insufficient documentation

## 2013-10-18 DIAGNOSIS — I2510 Atherosclerotic heart disease of native coronary artery without angina pectoris: Secondary | ICD-10-CM | POA: Insufficient documentation

## 2013-10-18 MED ORDER — IOHEXOL 300 MG/ML  SOLN
100.0000 mL | Freq: Once | INTRAMUSCULAR | Status: AC | PRN
  Administered 2013-10-18: 100 mL via INTRAVENOUS

## 2013-10-22 ENCOUNTER — Telehealth: Payer: Self-pay | Admitting: Oncology

## 2013-10-22 NOTE — Telephone Encounter (Signed)
S/W PATIENT WIFE AND GAVE NEW PATIENT APPT FOR 03/23 @ 11:15 W/DR. SHERRILL. D/T PER MD WELCOME PACKET MAILED.

## 2013-10-24 ENCOUNTER — Telehealth: Payer: Self-pay | Admitting: *Deleted

## 2013-10-24 NOTE — Telephone Encounter (Signed)
Spoke with patient and confirmed appointment with Dr. Benay Spice for 10/28/13.  Contact names and numbers were provided.

## 2013-10-25 ENCOUNTER — Telehealth: Payer: Self-pay | Admitting: *Deleted

## 2013-10-25 ENCOUNTER — Encounter: Payer: Self-pay | Admitting: Cardiovascular Disease

## 2013-10-25 ENCOUNTER — Encounter: Payer: Medicare Other | Admitting: *Deleted

## 2013-10-25 ENCOUNTER — Ambulatory Visit (INDEPENDENT_AMBULATORY_CARE_PROVIDER_SITE_OTHER): Payer: Medicare Other | Admitting: Cardiovascular Disease

## 2013-10-25 VITALS — BP 146/84 | HR 64 | Ht 71.5 in | Wt 187.9 lb

## 2013-10-25 DIAGNOSIS — Z9861 Coronary angioplasty status: Secondary | ICD-10-CM

## 2013-10-25 DIAGNOSIS — I441 Atrioventricular block, second degree: Secondary | ICD-10-CM

## 2013-10-25 DIAGNOSIS — E785 Hyperlipidemia, unspecified: Secondary | ICD-10-CM

## 2013-10-25 DIAGNOSIS — Z955 Presence of coronary angioplasty implant and graft: Secondary | ICD-10-CM

## 2013-10-25 DIAGNOSIS — I6350 Cerebral infarction due to unspecified occlusion or stenosis of unspecified cerebral artery: Secondary | ICD-10-CM

## 2013-10-25 DIAGNOSIS — I639 Cerebral infarction, unspecified: Secondary | ICD-10-CM

## 2013-10-25 DIAGNOSIS — G459 Transient cerebral ischemic attack, unspecified: Secondary | ICD-10-CM

## 2013-10-25 LAB — MDC_IDC_ENUM_SESS_TYPE_INCLINIC
MDC IDC SESS DTM: 20150320105921
MDC IDC SET ZONE DETECTION INTERVAL: 2000 ms
Zone Setting Detection Interval: 3000 ms
Zone Setting Detection Interval: 390 ms

## 2013-10-25 LAB — MDC_IDC_ENUM_SESS_TYPE_REMOTE

## 2013-10-25 LAB — PACEMAKER DEVICE OBSERVATION

## 2013-10-25 MED ORDER — METOPROLOL TARTRATE 25 MG PO TABS: 25.0000 mg | ORAL_TABLET | Freq: Two times a day (BID) | ORAL | Status: DC

## 2013-10-25 NOTE — Assessment & Plan Note (Signed)
This remains unexplained.

## 2013-10-25 NOTE — Assessment & Plan Note (Signed)
Current level of total cholesterol and LDL cholesterol is satisfactory. No changes are made to lipid-lowering medications. Diabetes control also appears to be adequate.

## 2013-10-25 NOTE — Telephone Encounter (Signed)
Notified yesterday to decrease Metoprolol to 25mg  bid.

## 2013-10-25 NOTE — Patient Instructions (Addendum)
Please notify the office immediately if you start to have frequent dizzy spells, blacks outs, or falls. If this happens over the weekend or over night [name] to have you transported to the ER.  Your transmitter will continue to monitor you and send transmissions for episodes of abnormal heart rhythms.  Your physician has recommended you make the following change in your medication: DECREASE METOPROLOL 25 MG TWICE DAILY  Your physician recommends that you schedule a follow-up appointment in: 4-5 weeks

## 2013-10-25 NOTE — Assessment & Plan Note (Signed)
He is now roughly 4-1/2 months status post the most recent drug-eluting stent placement. Ideally he would've remained on dual antiplatelet therapy for a full year, but with the recent gastrointestinal bleeding and the likely need for further procedures, I believe it is wisest to stop his Brilinta at this time. He should continue aspirin therapy. The risk of late stent thrombosis is real, but relatively low at this point.

## 2013-10-25 NOTE — Progress Notes (Signed)
Patient ID: Charles [name], male   DOB: 09-Nov-1934, 79 y.o.   MRN: [id number]      Reason for office visit Gastrointestinal bleeding in a patient with recent percutaneous angioplasty/stent  Crews continues to have a ever more complicated set of medical problems.  Last summer he was diagnosed with multivessel CAD and underwent bypass surgery in June. He continued to have exertional angina and cardiac catheterization in September showed occlusion of 2 of his bypass grafts. He underwent angioplasty and drug-eluting stent placement to the ramus intermedius in September. He continued to have exertional angina and then underwent a second endoplastic/drug-eluting stent procedure to the right coronary artery on October 31. Since that time his anginal symptoms have been virtually completely abolished.  He had a transient ischemic attack event in January. This remains unexplained. Transesophageal echocardiography did not reveal any cardioembolic sources and carotid duplex ultrasonography was also nondiagnostic. He received an implantable loop recorder to monitor for possible occult atrial fibrillation.  Most recently has developed gastrointestinal bleeding. Unfortunately, upper endoscopy demonstrates a bleeding, friable mass at the gastroesophageal junction and biopsies consistent with a malignant process. He is due to see Dr. Benay Spice in the oncology clinic next week. As of yet there has not been a decision regarding surgical management versus radiation therapy or chemotherapy.  To add insult to injury, interrogation of his loop recorder shows serious bradyarrhythmia. 4 episodes of pauses have been recorded. 3 of these events lasted for 3 seconds each and findings are very much consistent with second degree atrial ventricular block Mobitz type I (Wenckebach cycles). 2 of 3 happened during expected sleeping hours. All were asymptomatic.  The fourth episode is more concerning. On a background of normal sinus  rhythm with normal PR intervals following the premature ventricular contraction there is an extensive period of complete heart block lasting for almost 14 seconds. The QRS morphology remains narrow and identical to sinus rhythm.   This event happened around 9:15 PM (his loop recorder clock seems to be behind by about 30 minutes of the time was actually probably 9:45 PM). The patient recalls no symptoms at that time. She does not recall that particular date but states that his typical routine would be to watch the news until 10 PM.  He has not had syncope recently or in the past. No atrial fibrillation has been recorded by his device since implantation.     Allergies  Allergen Reactions  . Percocet [Oxycodone-Acetaminophen] Other (See Comments)    hallucinations  . Atorvastatin Other (See Comments)    Causes myalgias   . Ezetimibe Other (See Comments)    Causes dizziness  . Lovastatin Other (See Comments)    REACTION: weakness. Patient able to tolerate pravastatin  . Ranexa [Ranolazine] Other (See Comments)    Severe constipation    Current Outpatient Prescriptions  Medication Sig Dispense Refill  . aspirin EC 81 MG tablet Take 1 tablet (81 mg total) by mouth daily.  90 tablet  3  . Cholecalciferol (VITAMIN D3) 2000 UNITS TABS Take 2,000 Units by mouth daily.      . Coenzyme Q10 (COQ10) 100 MG CAPS Take 100 mg by mouth daily.      . diazepam (VALIUM) 2 MG tablet       . diphenhydramine-acetaminophen (TYLENOL PM) 25-500 MG TABS Take 1 tablet by mouth at bedtime as needed (for sleep).      . finasteride (PROSCAR) 5 MG tablet Take 5 mg by mouth every morning.       Marland Kitchen  isosorbide mononitrate (IMDUR) 60 MG 24 hr tablet Take 60 mg by mouth daily.      Marland Kitchen KRILL OIL 1000 MG CAPS Take 1,000 mg by mouth daily.       Marland Kitchen losartan (COZAAR) 50 MG tablet Take 50 mg by mouth daily.      . mometasone (ELOCON) 0.1 % cream Apply 1 application topically daily.  45 g  0  . Multiple Vitamins-Minerals (EYE  VITAMINS) TABS Take 1 tablet by mouth daily.      . nitroGLYCERIN (NITROSTAT) 0.4 MG SL tablet Place 0.4 mg under the tongue every 5 (five) minutes as needed for chest pain.      . polyethylene glycol (MIRALAX / GLYCOLAX) packet Take 17 g by mouth daily as needed for moderate constipation.      . pravastatin (PRAVACHOL) 40 MG tablet TAKE 1 TABLET BY MOUTH EVERY EVENING  90 tablet  3  . Ticagrelor (BRILINTA) 90 MG TABS tablet Take by mouth 2 (two) times daily.      Marland Kitchen GENERLAC 10 GM/15ML SOLN as needed.      . metoprolol tartrate (LOPRESSOR) 25 MG tablet Take 1 tablet (25 mg total) by mouth 2 (two) times daily.  180 tablet  3  . NEXIUM 40 MG capsule Take 40 mg by mouth as needed.       No current facility-administered medications for this visit.    Past Medical History  Diagnosis Date  . Hypertension   . Hyperlipidemia     diet controlled, statins and zetia intolerant  . Retinopathy     mild glaucomea  . Transient ischemic attack 1999  . Histoplasmosis   . Chronic cough 08/2009    -allergy profile- june 2,2011- neg. -Max GERD rx/ off oils January 07 2010 x 3 weeks only but no benefit. - Sinus CT rec July 11,2011- refused due to claustrophobia -MCT neg for asthma 03/01/10 -Add 1st Gen H1 July 11,2011- better  . Macular degeneration   . Coronary artery disease     CABG 01-2013, stent 04-2013    . Diabetes mellitus     no meds  . PONV (postoperative nausea and vomiting)     extreme nausea  . Heart murmur     TIA    Past Surgical History  Procedure Laterality Date  . Cervical spine surgery  2002    plate  . Cholecystectomy  1985  . Percutaneous coronary stent intervention (pci-s)  101/31/2014    [facility]ARDING  . Coronary artery bypass graft N/A 01/21/2013    Procedure: Coronary Arery Bypass Grafting  Times Five Using Left Internal Mammary Artery and Right Saphenous Leg Vein Harvested Endoscopically;  Surgeon: Melrose Nakayama, MD;  Location: [facility];  Service: Open Heart Surgery;   Laterality: N/A;  . Tee without cardioversion N/A 08/23/2013    Procedure: TRANSESOPHAGEAL ECHOCARDIOGRAM (TEE);  Surgeon: Sanda Klein, MD;  Location: [facility] ENDOSCOPY;  Service: Cardiovascular;  Laterality: N/A;  . Esophagogastroduodenoscopy N/A 10/17/2013    Procedure: ESOPHAGOGASTRODUODENOSCOPY (EGD);  Surgeon: Winfield Cunas., MD;  Location: [facility];  Service: Endoscopy;  Laterality: N/A;    Family History  Problem Relation Age of Onset  . Coronary artery disease Neg Hx   . Stroke Neg Hx   . Colon cancer Neg Hx   . Prostate cancer Neg Hx   . Atopy Neg Hx   . Asthma Neg Hx   . Hypertension Mother   . Hypertension Father   . Diabetes Father   .  Cancer Father     colon  . Hypertension Brother   . Cancer Brother     lung  . Hypertension Sister   . Cancer Sister     lung  . Diabetes Brother   . Diabetes Sister   . Diabetes      Grandparents   . Lung cancer Brother   . Lung cancer Sister     History   Social History  . Marital Status: Married    Spouse Name: [name]    Number of Children: 2  . Years of Education: college   Occupational History  . reitred    Social History Main Topics  . Smoking status: Former Smoker -- 1.00 packs/day for 10 years    Types: Cigarettes    Quit date: 01/17/1977  . Smokeless tobacco: Never Used     Comment: Stopped at age 55   . Alcohol Use: No     Comment: seldom  . Drug Use: No  . Sexual Activity: No   Other Topics Concern  . Not on file   Social History Narrative  . No narrative on file    Review of systems: The patient specifically denies any chest pain at rest or with exertion, dyspnea at rest or with exertion, orthopnea, paroxysmal nocturnal dyspnea, syncope, palpitations, focal neurological deficits, intermittent claudication, lower extremity edema, unexplained weight gain, cough, hemoptysis or wheezing.  The patient also denies abdominal pain, nausea, vomiting, dysphagia, diarrhea, constipation, polyuria,  polydipsia, dysuria, hematuria, frequency, urgency, abnormal bleeding or bruising, fever, chills, unexpected weight changes, mood swings, change in skin or hair texture, change in voice quality, auditory or visual problems, allergic reactions or rashes, new musculoskeletal complaints other than usual "aches and pains".   PHYSICAL EXAM BP 146/84  Pulse 64  Ht 5' 11.5" (1.816 m)  Wt 85.231 kg (187 lb 14.4 oz)  BMI 25.84 kg/m2  General: Alert, oriented x3, no distress. He appears a little downcast, whereas in the past he has always been jovial and optimistic. Head: no evidence of trauma, PERRL, EOMI, no exophtalmos or lid lag, no myxedema, no xanthelasma; normal ears, nose and oropharynx Neck: normal jugular venous pulsations and no hepatojugular reflux; brisk carotid pulses without delay and no carotid bruits Chest: clear to auscultation, no signs of consolidation by percussion or palpation, normal fremitus, symmetrical and full respiratory excursions Cardiovascular: normal position and quality of the apical impulse, regular rhythm, normal first and second heart sounds, no murmurs, rubs or gallops Abdomen: no tenderness or distention, no masses by palpation, no abnormal pulsatility or arterial bruits, normal bowel sounds, no hepatosplenomegaly Extremities: no clubbing, cyanosis or edema; 2+ radial, ulnar and brachial pulses bilaterally; 2+ right femoral, posterior tibial and dorsalis pedis pulses; 2+ left femoral, posterior tibial and dorsalis pedis pulses; no subclavian or femoral bruits Neurological: grossly nonfocal   EKG: Normal sinus rhythm, chronic right bundle branch block  Lipid Panel     Component Value Date/Time   CHOL 167 08/07/2013 0700   TRIG 165* 08/07/2013 0700   HDL 52 08/07/2013 0700   CHOLHDL 3.2 08/07/2013 0700   VLDL 33 08/07/2013 0700   LDLCALC 82 08/07/2013 0700    BMET    Component Value Date/Time   NA 145 10/17/2013 1442   K 4.4 10/17/2013 1442   CL 107  10/17/2013 1442   CO2 27 10/17/2013 1442   GLUCOSE 151* 10/17/2013 1442   BUN 29* 10/17/2013 1442   CREATININE 1.46* 10/17/2013 1442   CREATININE 1.44* 05/23/2013  0809   CALCIUM 9.3 10/17/2013 1442   GFRNONAA 45* 10/17/2013 1442   GFRAA 52* 10/17/2013 1442     ASSESSMENT AND PLAN Second degree AV block Several of the pauses have clear evidence of gradual prolongation of the PR interval consistent with Mobitz type I block and suggestive of superimposed in disease. On the other hand he has a long-standing right bundle branch block and one episode of high-grade AV block with several consecutive blocked P waves. This was suggest that he also has infra-Hiss in disease. Surprisingly none of these episodes have been symptomatic. I really think he needs a dual-chamber permanent pacemaker, that he does not want to make any decisions regarding this until he has a better understanding of the prognosis of his esophageal malignancy. As long as he remains completely asymptomatic, this is not entirely unreasonable. I have asked him to call me immediately should he develop presyncope or syncope. Gave him literature regarding pacemakers. Decrease metoprolol to 25 mg twice a day. May have to further decrease this if episodes of AV block repair. This may lead to recurrence of angina, for which we will have to compensate with alternative antianginal medications the  S/P SVG-RI/OM DES 05/06/13- staged DES to RCA 06/07/13 He is now roughly 4-1/2 months status post the most recent drug-eluting stent placement. Ideally he would've remained on dual antiplatelet therapy for a full year, but with the recent gastrointestinal bleeding and the likely need for further procedures, I believe it is wisest to stop his Brilinta at this time. He should continue aspirin therapy. The risk of late stent thrombosis is real, but relatively low at this point.  TIA (transient ischemic attack) This remains unexplained.  HYPERLIPIDEMIA Current  level of total cholesterol and LDL cholesterol is satisfactory. No changes are made to lipid-lowering medications. Diabetes control also appears to be adequate.   Orders Placed This Encounter  Procedures  . Implantable device check   Meds ordered this encounter  Medications  . GENERLAC 10 GM/15ML SOLN    Sig: as needed.  Marland Kitchen NEXIUM 40 MG capsule    Sig: Take 40 mg by mouth as needed.  . metoprolol tartrate (LOPRESSOR) 25 MG tablet    Sig: Take 1 tablet (25 mg total) by mouth 2 (two) times daily.    Dispense:  180 tablet    Refill:  [place] Jaimya Feliciano, MD, [facility] [phone] office [phone] pager

## 2013-10-25 NOTE — Assessment & Plan Note (Addendum)
Several of the pauses have clear evidence of gradual prolongation of the PR interval consistent with Mobitz type I block and suggestive of superimposed in disease. On the other hand he has a long-standing right bundle branch block and one episode of high-grade AV block with several consecutive blocked P waves. This was suggest that he also has infra-Hiss in disease. Surprisingly none of these episodes have been symptomatic. I really think he needs a dual-chamber permanent pacemaker, that he does not want to make any decisions regarding this until he has a better understanding of the prognosis of his esophageal malignancy. As long as he remains completely asymptomatic, this is not entirely unreasonable. I have asked him to call me immediately should he develop presyncope or syncope. Gave him literature regarding pacemakers. Decrease metoprolol to 25 mg twice a day. May have to further decrease this if episodes of AV block repair. This may lead to recurrence of angina, for which we will have to compensate with alternative antianginal medications the

## 2013-10-25 NOTE — Telephone Encounter (Signed)
Message copied by Tressa Busman on Fri Oct 25, 2013  4:40 PM ------      Message from: Sanda Klein      Created: Fri Oct 25, 2013  2:57 PM       Pause episodes are consistent with second degree AV block. QRS morphology unchanged and gradual PR prolongation on 3/4 episodes, asymptomatic pause ofalmost 3 seconds.            One episode is very long - 14 seconds, with clear high grade AV block - multiple consecutive non conducted P waves. Same QRS morphology. Episode occurred 2 months ago. No associated symptoms - patient believes he was watching TV at the time, no syncope.             Meets criteria for permanent pacemaker, but wishes to defer until clarification of cancer status - malignancy on biopsy of gastroesophageal junction per his report. Seeing Oncology next week.            Reduce metoprolol to 50% of previous dose. ------

## 2013-10-28 ENCOUNTER — Ambulatory Visit: Payer: Medicare Other | Admitting: Nutrition

## 2013-10-28 ENCOUNTER — Ambulatory Visit: Payer: Medicare Other

## 2013-10-28 ENCOUNTER — Encounter: Payer: Self-pay | Admitting: Oncology

## 2013-10-28 ENCOUNTER — Telehealth: Payer: Self-pay | Admitting: Oncology

## 2013-10-28 ENCOUNTER — Ambulatory Visit (HOSPITAL_BASED_OUTPATIENT_CLINIC_OR_DEPARTMENT_OTHER): Payer: Medicare Other | Admitting: Oncology

## 2013-10-28 VITALS — BP 157/66 | HR 56 | Temp 97.0°F | Resp 18 | Ht 71.0 in | Wt 187.7 lb

## 2013-10-28 DIAGNOSIS — C160 Malignant neoplasm of cardia: Secondary | ICD-10-CM | POA: Insufficient documentation

## 2013-10-28 DIAGNOSIS — I10 Essential (primary) hypertension: Secondary | ICD-10-CM

## 2013-10-28 DIAGNOSIS — Z86718 Personal history of other venous thrombosis and embolism: Secondary | ICD-10-CM

## 2013-10-28 DIAGNOSIS — I25810 Atherosclerosis of coronary artery bypass graft(s) without angina pectoris: Secondary | ICD-10-CM

## 2013-10-28 DIAGNOSIS — E119 Type 2 diabetes mellitus without complications: Secondary | ICD-10-CM

## 2013-10-28 DIAGNOSIS — C159 Malignant neoplasm of esophagus, unspecified: Secondary | ICD-10-CM

## 2013-10-28 NOTE — Progress Notes (Signed)
[facility] Patient Consult   Referring MD: Harjas Biggins 79 y.o.  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 Larose Kells 09/12/2013. The stool was Hemoccult positive. He was referred to Dr. Oletta Lamas and underwent an upper endoscopy 10/17/2013. Ulcerations were noted at the GE junction with a large posterior mass at the GE junction. Multiple biopsies were obtained. The pathology ([id number]) revealed an invasive poorly differentiated adenocarcinoma with intestinal differentiation.  He was referred for CTs of the chest, abdomen, and pelvis on 10/18/2013. No suspicious pulmonary nodules. A calcified granulomas were noted. Calcified mediastinal and right hilar nodes. Focal asymmetric wall thickening of the gastric cardia just below the GE junction. A 1.4 cm gastrohepatic node is concerning for metastasis. The liver and adrenal glands appeared normal. Calcified splenic granulomata.  He no longer has dark stools. He discontinued Brilinta.  Past Medical History  Diagnosis Date  . Hypertension   . Hyperlipidemia     diet controlled, statins and zetia intolerant  . Retinopathy     mild glaucomea  . Transient ischemic attack 1999  . Histoplasmosis   . Chronic cough 08/2009    -allergy profile- june 2,2011- neg. -Max GERD rx/ off oils January 07 2010 x 3 weeks only but no benefit. - Sinus CT rec July 11,2011- refused due to claustrophobia -MCT neg for asthma 03/01/10 -Add 1st Gen H1 July 11,2011- better  . Macular degeneration   . Coronary artery disease     CABG 01-2013, stent 04-2013    . Diabetes mellitus     no meds  . PONV (postoperative nausea and vomiting)     extreme nausea  .  TIA December 2014       .   AV block on the heart loop recorder                                                                 2015  Past Surgical History  Procedure Laterality Date  . Cervical  spine surgery  2002    plate  . Cholecystectomy  1985  . Percutaneous coronary stent intervention (pci-s)  101/31/2014    Dr[facility]hARDING  . Coronary artery bypass graft N/A 01/21/2013    Procedure: Coronary Arery Bypass Grafting  Times Five Using Left Internal Mammary Artery and Right Saphenous Leg Vein Harvested Endoscopically;  Surgeon: Melrose Nakayama, MD;  Location: [facility];  Service: Open Heart Surgery;  Laterality: N/A;  . Tee without cardioversion N/A 08/23/2013    Procedure: TRANSESOPHAGEAL ECHOCARDIOGRAM (TEE);  Surgeon: Sanda Klein, MD;  Location: [facility] ENDOSCOPY;  Service: Cardiovascular;  Laterality: N/A;  . Esophagogastroduodenoscopy N/A 10/17/2013    Procedure: ESOPHAGOGASTRODUODENOSCOPY (EGD);  Surgeon: Winfield Cunas., MD;  Location: [facility];  Service: Endoscopy;  Laterality: N/A;    Family History  Problem Relation Age of Onset  . Coronary artery disease Neg Hx   . Stroke Neg Hx   . Colon cancer Neg Hx   . Prostate cancer Neg Hx   . Atopy Neg Hx   . Asthma Neg Hx   . Hypertension Mother   . Hypertension Father   .  Diabetes Father   . Cancer Father     colon  . Hypertension Brother   . Cancer Brother     l "leukemia "and melanoma   . Hypertension Sister   . Cancer Sister      breast   . Diabetes Brother   . Diabetes Sister   . Diabetes      Grandparents   .  thyroid cancer  Brother   .  breast cancer  Sister     .   Liver cancer                                       mother   Current outpatient prescriptions:aspirin EC 81 MG tablet, Take 1 tablet (81 mg total) by mouth daily., Disp: 90 tablet, Rfl: 3;  Cholecalciferol (VITAMIN D3) 2000 UNITS TABS, Take 2,000 Units by mouth daily., Disp: , Rfl: ;  Coenzyme Q10 (COQ10) 100 MG CAPS, Take 100 mg by mouth daily., Disp: , Rfl: ;  diazepam (VALIUM) 2 MG tablet, Take 2 mg by mouth at bedtime as needed. , Disp: , Rfl:  diphenhydramine-acetaminophen (TYLENOL PM) 25-500 MG TABS, Take 1 tablet by mouth at bedtime as  needed (for sleep)., Disp: , Rfl: ;  finasteride (PROSCAR) 5 MG tablet, Take 5 mg by mouth every morning. , Disp: , Rfl: ;  GENERLAC 10 GM/15ML SOLN, 10 g as needed. , Disp: , Rfl: ;  isosorbide mononitrate (IMDUR) 60 MG 24 hr tablet, Take 60 mg by mouth daily., Disp: , Rfl: ;  KRILL OIL 1000 MG CAPS, Take 1,000 mg by mouth daily. , Disp: , Rfl:  losartan (COZAAR) 50 MG tablet, Take 50 mg by mouth daily., Disp: , Rfl: ;  metoprolol tartrate (LOPRESSOR) 25 MG tablet, Take 1 tablet (25 mg total) by mouth 2 (two) times daily., Disp: 180 tablet, Rfl: 3;  Multiple Vitamins-Minerals (EYE VITAMINS) TABS, Take 1 tablet by mouth daily., Disp: , Rfl: ;  NEXIUM 40 MG capsule, Take 40 mg by mouth daily. , Disp: , Rfl:  polyethylene glycol (MIRALAX / GLYCOLAX) packet, Take 17 g by mouth daily. , Disp: , Rfl: ;  pravastatin (PRAVACHOL) 40 MG tablet, TAKE 1 TABLET BY MOUTH EVERY EVENING, Disp: 90 tablet, Rfl: 3;  nitroGLYCERIN (NITROSTAT) 0.4 MG SL tablet, Place 0.4 mg under the tongue every 5 (five) minutes as needed for chest pain., Disp: , Rfl:   Allergies:  Allergies  Allergen Reactions  . Percocet [Oxycodone-Acetaminophen] Other (See Comments)    hallucinations  . Atorvastatin Other (See Comments)    Causes myalgias   . Ezetimibe Other (See Comments)    Causes dizziness  . Lovastatin Other (See Comments)    REACTION: weakness. Patient able to tolerate pravastatin  . Ranexa [Ranolazine] Other (See Comments)    Severe constipation    Social History: He is retired from the [employer]. He quit smoking cigarettes at age 71. Rare alcohol use. No transfusion history. No risk factor for HIV or hepatitis.    ROS:   Positives include: black stools February 2015, one episode of emesis, memory difficulty since the TIA in December 2014, pruritic rash at the left hand and right leg-resolved   A complete ROS was otherwise negative.  Physical Exam:  Blood pressure 157/66, pulse 56, temperature 97 F (36.1  C), temperature source Oral, resp. rate 18, height 5\' 11"  (1.803 m), weight 187 lb 11.2  oz (85.14 kg), SpO2 100.00%.  HEENT:  oropharynx without visible mass, neck without mass Lungs:  clear bilaterally  Cardiac:  irregular  Abdomen:  No hepatosplenomegaly, nontender, no mass GU:  testes without mass   Vascular:  no leg edema  Lymph nodes:  no cervical, supraclavicular, axillary, or inguinal nodes  Neurologic:  alert and oriented, the motor exam appears intact in the upper and lower extremities Skin:  no rash  Musculoskeletal:  no spine tenderness   LAB:  CBC  Lab Results  Component Value Date   WBC 7.3 10/17/2013   HGB 13.9 10/17/2013   HCT 41.3 10/17/2013   MCV 93.4 10/17/2013   PLT 207 10/17/2013   NEUTROABS 3.9 10/17/2013     CMP      Component Value Date/Time   NA 145 10/17/2013 1442   K 4.4 10/17/2013 1442   CL 107 10/17/2013 1442   CO2 27 10/17/2013 1442   GLUCOSE 151* 10/17/2013 1442   BUN 29* 10/17/2013 1442   CREATININE 1.46* 10/17/2013 1442   CREATININE 1.44* 05/23/2013 0809   CALCIUM 9.3 10/17/2013 1442   PROT 6.6 10/17/2013 1442   ALBUMIN 3.6 10/17/2013 1442   AST 21 10/17/2013 1442   ALT 17 10/17/2013 1442   ALKPHOS 60 10/17/2013 1442   BILITOT 0.7 10/17/2013 1442   GFRNONAA 45* 10/17/2013 1442   GFRAA 52* 10/17/2013 1442     Radiology: as per history of present illness, I reviewed the abdomen CT from 10/18/2013 with Mr. Weisensel and his wife.     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 History of coronary artery disease-status post coronary artery bypass surgery and placement of coronary stents  3. History of a TIA on 2 occasions  4. Remote history of histoplasmosis  5. Diabetes  6. Hypertension   Disposition:    Mr. Oakley has been diagnosed with adenocarcinoma of the gastroesophageal junction. I discussed the diagnosis  and treatment options with Mr. Fawver and his wife. I explained that most patients with distal esophageal or GE junction carcinoma receive chemotherapy/radiation followed by surgery. I am concerned he will not be a surgical candidate based on his history of cardiovascular disease.  He will be referred for a staging PET scan and endoscopic ultrasound as indicated. We will make a referral to radiation oncology. My recommendation is to proceed with concurrent chemotherapy and radiation if he does not have distant metastatic disease. We will also make a surgical referral.  I recommend weekly Taxol/carboplatin chemotherapy. We reviewed the potential toxicities associated with this chemotherapy regimen including the chance for nausea/vomiting, mucositis, alopecia, diarrhea, and hematologic toxicity. We discussed the allergic reaction associated with Taxol and carboplatin. We reviewed the neuropathy and bone pain seen with Taxol. He will attend a chemotherapy teaching class.  Mr. Brahmbhatt will be referred for a staging PET scan. His case will be presented at the GI tumor conference. He will return for an office visit on 11/13/2013. I anticipate initiation of combined modality therapy during the week of 11/18/2013.   Approximately 50 minutes were spent with patient today. The majority of the time was used for counseling and coordination of care.  [place], [place] 10/28/2013, 12:34 PM

## 2013-10-28 NOTE — Progress Notes (Signed)
Met with Charles [name] and family. Explained role of nurse navigator. Educational information provided on GE junction cancer  Referral made to dietician for diet education and he was seen today. [facility] resources provided to patient, including SW service information.  Contact names and phone numbers were provided for entire [facility] team.  Teach back method was used.  No barriers to care identified.  Will continue to follow as needed.

## 2013-10-28 NOTE — Progress Notes (Signed)
GI Location of Tumor / Histology:  Adenocarcinoma of the GE junction  Patient presented with "black "stool for several days and saw Dr. Larose Kells 09/12/2013. The stool was Hemoccult positive. He was referred to Dr. Oletta Lamas and underwent an upper endoscopy 10/17/2013.  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  Past/Anticipated interventions by surgeon, if any: will have surgery consult  Past/Anticipated interventions by medical oncology, if any: weekly Taxol/carboplatin chemotherapy chmeo education 10/08/13, Pet scan 11/08/13  pet scan   Weight changes, if any: gained 8 lbs since last year  Bowel/Bladder complaints, if any: constipated, takes miralax,   Nausea / Vomiting, if any: no, trouble swallowing  Pain issues, if any:  Discomfort lower abdomen, and rt hip,   SAFETY ISSUES:  Prior radiation? no  Pacemaker/ICD? no  Possible current pregnancy? no  Is the patient on methotrexate? no  Current Complaints / other details:  Married, 2 children, father colon cancer, older sister lung ca ,smoker, living dx age 42, now 66,,  younger sister breast ca, deceased from embolism, older brother leukemia dx age 81, living age 109, mother liver deceased liver ca,late 55's, younger bother deceased thyroid cancer,  Died from [place] ,age 32, wife treated here  For breast cancer, with Dr. Tammi Klippel 8 years ago

## 2013-10-28 NOTE — Progress Notes (Signed)
Checked in new pt with no financial concerns. °

## 2013-10-28 NOTE — Progress Notes (Signed)
79 year old male diagnosed with cancer of the GE junction.  He is a patient of Dr. Benay Spice.  Past medical history includes hypertension, hyperlipidemia, TIA, macular degeneration, CAD, diabetes, and postop nausea vomiting.  Medications include vitamin D 3, CoQ10, Proscar, Krill oil, multivitamin, MiraLax, and Nexium.  Labs include glucose of 151, BUN 29, creatinine 1.46 on March 12.  Height: 71 inches. Weight: 187.7 pounds March 23. Usual body weight: 180 pounds. BMI: 26.19.  Patient denies recent weight loss.  In fact, patient has gained approximately 7 pounds from his usual body weight.  It is his understanding that he will receive chemoradiation therapy.  He currently has no nutrition side effects.  He does try to eat a healthy diet secondary to his heart disease and diabetes.  Patient attributes weight gain to decreased physical activity.  Nutrition diagnosis: Food and nutrition related knowledge deficit related to new diagnosis of cancer.  The GE junction and associated treatments as evidenced by no prior need for nutrition related information.  Intervention: Patient and wife were educated on strategies for consuming smaller, more frequent meals and snacks throughout the day with adequate calories for weight maintenance and increased protein to promote healing.  Educated patient that I would assist him with side effect management as needed.  Provided fact sheets and my contact information.  Questions were answered.  Teach back method used.  Monitoring, evaluation, goals: Patient will tolerate adequate calories and protein for weight maintenance throughout treatment.  Next visit: Will attempt followup during chemotherapy treatments.

## 2013-10-28 NOTE — Telephone Encounter (Signed)
GV ADN PRINTED APPT SCHED AND AVS FOR PT FOR mARCH ADN aPRIL.....md TO ADD PT APPT FOR 4.8.15

## 2013-10-29 ENCOUNTER — Telehealth: Payer: Self-pay | Admitting: *Deleted

## 2013-10-29 ENCOUNTER — Other Ambulatory Visit (HOSPITAL_COMMUNITY): Payer: Self-pay | Admitting: Physician Assistant

## 2013-10-29 ENCOUNTER — Encounter: Payer: Self-pay | Admitting: Radiation Oncology

## 2013-10-29 NOTE — Telephone Encounter (Signed)
Order sent to pathology per Dr. Benay Spice for Her-2 New testing to be performed on  Accession: [id number].  Spoke with J. Epps in pathology.

## 2013-10-30 ENCOUNTER — Encounter: Payer: Self-pay | Admitting: Radiation Oncology

## 2013-10-30 ENCOUNTER — Ambulatory Visit
Admission: RE | Admit: 2013-10-30 | Discharge: 2013-10-30 | Disposition: A | Discharge status: Home / Self Care | Payer: Medicare Other | Source: Ambulatory Visit | Attending: Radiation Oncology | Admitting: Radiation Oncology

## 2013-10-30 ENCOUNTER — Ambulatory Visit
Admission: RE | Admit: 2013-10-30 | Discharge: 2013-10-30 | Disposition: A | Discharge status: Home / Self Care | Payer: Medicare Other | Source: Ambulatory Visit

## 2013-10-30 VITALS — BP 171/64 | HR 66 | Temp 97.7°F | Resp 20 | Ht 71.5 in | Wt 187.4 lb

## 2013-10-30 DIAGNOSIS — C160 Malignant neoplasm of cardia: Secondary | ICD-10-CM | POA: Insufficient documentation

## 2013-10-30 DIAGNOSIS — Z510 Encounter for antineoplastic radiation therapy: Secondary | ICD-10-CM | POA: Insufficient documentation

## 2013-10-30 HISTORY — DX: Malignant neoplasm of cardia: C16.0

## 2013-10-30 NOTE — Progress Notes (Signed)
Please see the Nurse Progress Note in the MD Initial Consult Encounter for this patient. 

## 2013-10-30 NOTE — Progress Notes (Signed)
Radiation Oncology         [phone] ________________________________  Initial outpatient Consultation  Name: Charles [name] MRN: [id number]  Date: 10/30/2013  DOB: 15-Feb-1935  HQ:PRFF Larose Kells, MD  Ladell Pier, MD   REFERRING PHYSICIAN: Ladell Pier, MD  DIAGNOSIS: Gastroesophageal cancer   Primary site: Esophagus - Adenocarcinoma   Staging method: AJCC 7th Edition   Clinical: (TX, N1, M0)   Summary: (TX, N1, M0)  Charles is a 79 y.o. male who is seen out of the courtesy of Dr. Julieanne Manson for an opinion concerning radiation therapy as part of management of patient's recently diagnosed adenocarcinoma of the gastroesophageal junction.   The patient initially presented with a several day history of black stools. He was seen by his primary care physician and stool was Hemoccult positive. Patient underwent upper endoscopy on March 12. Ulcerations were noted at the GE junction as well as a large posterior mass at the GE junction. Biopsies from this area revealed invasive poorly differentiated adenocarcinoma with intestinal differentiation. A subsequent CT scan of the chest abdomen and pelvis was performed which revealed thickening of the gastric cardia just below the GE junction. A 1.4 cm gastrohepatic node was noted, concerning for metastasis. Patient was seen by medical oncology and is now referred to radiation oncology for consideration for combined modality therapy. PET scan is scheduled for April 4 to complete the staging workup.Marland Kitchen  PREVIOUS RADIATION THERAPY: No  PAST MEDICAL HISTORY:  has a past medical history of Hypertension; Hyperlipidemia; Retinopathy; Transient ischemic attack (1999); Histoplasmosis; Chronic cough (08/2009); Macular degeneration; Coronary artery disease; Diabetes mellitus; PONV (postoperative nausea and vomiting); Heart murmur; Adenocarcinoma of gastroesophageal junction; and Stroke (1995).    PAST SURGICAL HISTORY: Past  Surgical History  Procedure Laterality Date  . Cervical spine surgery  2002    plate  . Cholecystectomy  1985  . Percutaneous coronary stent intervention (pci-s)  101/31/2014    [facility]  . Coronary artery bypass graft N/A 01/21/2013    Procedure: Coronary Arery Bypass Grafting  Times Five Using Left Internal Mammary Artery and Right Saphenous Leg Vein Harvested Endoscopically;  Surgeon: Melrose Nakayama, MD;  Location: [facility];  Service: Open Heart Surgery;  Laterality: N/A;  . Tee without cardioversion N/A 08/23/2013    Procedure: TRANSESOPHAGEAL ECHOCARDIOGRAM (TEE);  Surgeon: Sanda Klein, MD;  Location: [facility] ENDOSCOPY;  Service: Cardiovascular;  Laterality: N/A;  . Esophagogastroduodenoscopy N/A 10/17/2013    Procedure: ESOPHAGOGASTRODUODENOSCOPY (EGD);  Surgeon: Winfield Cunas., MD;  Location: [facility];  Service: Endoscopy;  Laterality: N/A;  . Appendectomy      FAMILY HISTORY: family history includes Cancer in his brother, father, and sister; Diabetes in his brother, father, sister, and another family member; Hypertension in his brother, father, mother, and sister; Lung cancer in his brother and sister. There is no history of Coronary artery disease, Stroke, Colon cancer, Prostate cancer, Atopy, or Asthma.  SOCIAL HISTORY:  reports that he quit smoking about 36 years ago. His smoking use included Cigarettes. He has a 10 pack-year smoking history. He has never used smokeless tobacco. He reports that he does not drink alcohol or use illicit drugs. retired from working with [employer]. He enjoys golf and woodworking as hobbies.  ALLERGIES: Percocet; Atorvastatin; Ezetimibe; Lovastatin; and Ranexa  MEDICATIONS:  Current Outpatient Prescriptions  Medication Sig Dispense Refill  . aspirin EC 81 MG tablet Take 1 tablet (81 mg total) by mouth daily.  90 tablet  3  . Cholecalciferol (VITAMIN D3) 2000 UNITS TABS Take 2,000 Units by mouth daily.      . Coenzyme Q10 (COQ10)  100 MG CAPS Take 100 mg by mouth daily.      . diazepam (VALIUM) 2 MG tablet Take 2 mg by mouth at bedtime as needed.       . diphenhydramine-acetaminophen (TYLENOL PM) 25-500 MG TABS Take 1 tablet by mouth at bedtime as needed (for sleep).      . finasteride (PROSCAR) 5 MG tablet Take 5 mg by mouth every morning.       Marland Kitchen GENERLAC 10 GM/15ML SOLN 10 g as needed.       . isosorbide mononitrate (IMDUR) 60 MG 24 hr tablet Take 1 tablet (60 mg total) by mouth daily.  30 tablet  6  . KRILL OIL 1000 MG CAPS Take 1,000 mg by mouth daily.       Marland Kitchen losartan (COZAAR) 50 MG tablet Take 50 mg by mouth daily.      . metoprolol tartrate (LOPRESSOR) 25 MG tablet Take 1 tablet (25 mg total) by mouth 2 (two) times daily.  180 tablet  3  . Multiple Vitamins-Minerals (EYE VITAMINS) TABS Take 1 tablet by mouth daily.      Marland Kitchen NEXIUM 40 MG capsule Take 40 mg by mouth daily.       . polyethylene glycol (MIRALAX / GLYCOLAX) packet Take 17 g by mouth daily.       . pravastatin (PRAVACHOL) 40 MG tablet TAKE 1 TABLET BY MOUTH EVERY EVENING  90 tablet  3  . nitroGLYCERIN (NITROSTAT) 0.4 MG SL tablet Place 0.4 mg under the tongue every 5 (five) minutes as needed for chest pain.       No current facility-administered medications for this encounter.    REVIEW OF SYSTEMS:  A 15 point review of systems is documented in the electronic medical record. This was obtained by the nursing staff. However, I reviewed this with the patient to discuss relevant findings and make appropriate changes.  Patient denies any further dark stools or change in bowel habits. Patient had one episode of emesis in February. He has had some memory difficulty since TIA in December 2014. The admits to some dysphasia over a several month.. He has a sensation of food occasionally getting hung up in the lower neck region. He is able to swallow meets at this time without difficulty. Does complain of a burning sensation within the chest with significant walking. He  denies any hematemesis.Marland Kitchen His appetite is good. His wife admits that he is cut down on volume of food he normally eats.  He also complains of some pain in the right lower paraspinal back which is aggravated with golfing.   PHYSICAL EXAM:  height is 5' 11.5" (1.816 m) and weight is 187 lb 6.4 oz (85.004 kg). His oral temperature is 97.7 F (36.5 C). His blood pressure is 171/64 and his pulse is 66. His respiration is 20.   BP 171/64  Pulse 66  Temp(Src) 97.7 F (36.5 C) (Oral)  Resp 20  Ht 5' 11.5" (1.816 m)  Wt 187 lb 6.4 oz (85.004 kg)  BMI 25.78 kg/m2  General Appearance:    Alert, cooperative, no distress, appears stated age, somewhat hard of hearing    Head:    Normocephalic, without obvious abnormality, atraumatic  Eyes:    PERRL, conjunctiva/corneas clear, EOM's intact,        Ears:    Normal TM's and  external ear canals, both ears  Nose:   Nares normal, septum midline, mucosa normal, no drainage    or sinus tenderness  Throat:   Lips, mucosa, and tongue normal; teeth and gums normal  Neck:   Supple, symmetrical, trachea midline, no adenopathy;       thyroid:  No enlargement/tenderness/nodules; no carotid   bruit or JVD  Back:     Symmetric, no curvature, ROM normal, no CVA tenderness  Lungs:     Clear to auscultation bilaterally, respirations unlabored  Chest wall:    No tenderness or deformity, scar along the chest area from prior CABG, loop recorder present in the left upper chest   Heart:    Regular rate and rhythm,    Abdomen:     Soft, non-tender, bowel sounds active all four quadrants,    no masses, no organomegaly, vertical scar in the right upper quadrant from prior cholecystectomy         Extremities:   Extremities normal, atraumatic, no cyanosis or edema  Pulses:   2+ and symmetric all extremities  Skin:   Skin color, texture, turgor normal, no rashes or lesions  Lymph nodes:   Cervical, supraclavicular, and axillary nodes normal  Neurologic:   Normal strength,  sensation and reflexes      throughout     ECOG = 1    1 - Symptomatic but completely ambulatory (Restricted in physically strenuous activity but ambulatory and able to carry out work of a light or sedentary nature. For example, light housework, office work)   LABORATORY DATA:  Lab Results  Component Value Date   WBC 7.3 10/17/2013   HGB 13.9 10/17/2013   HCT 41.3 10/17/2013   MCV 93.4 10/17/2013   PLT 207 10/17/2013   NEUTROABS 3.9 10/17/2013   Lab Results  Component Value Date   NA 145 10/17/2013   K 4.4 10/17/2013   CL 107 10/17/2013   CO2 27 10/17/2013   GLUCOSE 151* 10/17/2013   CREATININE 1.46* 10/17/2013   CALCIUM 9.3 10/17/2013      RADIOGRAPHY:     Ct Abdomen W Contrast  10/18/2013   CLINICAL DATA:  Ulcerated mass at GE junction on endoscopy  EXAM: CT CHEST AND ABDOMEN WITH CONTRAST  TECHNIQUE: Multidetector CT imaging of the chest and abdomen was performed following the standard protocol during bolus administration of intravenous contrast.  CONTRAST:  176mL OMNIPAQUE IOHEXOL 300 MG/ML  SOLN  COMPARISON:  None.  FINDINGS: CT CHEST FINDINGS  6 mm calcified granuloma in the right upper lobe (series 5/ image 23). Additional 2 mm calcified granuloma at the right lung base (series 5/ image 49). No suspicious pulmonary nodules. No pleural effusion or pneumothorax.  Visualized thyroid is unremarkable.  The heart is normal in size. No pericardial effusion. Coronary atherosclerosis. Postsurgical changes related to prior CABG. Atherosclerotic calcifications of the aortic arch.  Calcified mediastinal and right hilar nodes. 8 mm short axis noncalcified subcarinal node, within normal limits. No suspicious axillary lymphadenopathy.  Metallic device (loop recorder) in the left anterior chest wall (series 2/ image 22).  CT ABDOMEN FINDINGS  Focal eccentric wall thickening in the gastric cardia at/just below the GE junction (series 2/images 51-54), likely corresponding to the endoscopic abnormality,  worrisome for tumor.  Associated 1.4 cm short axis gastrohepatic node (series 2/image 55), worrisome for metastasis.  Liver, pancreas, and adrenal glands are within normal limits.  Calcified splenic granulomata.  Status post cholecystectomy. No intrahepatic or extrahepatic ductal dilatation.  Kidneys  are within normal limits.  No hydronephrosis.  Atherosclerotic calcifications of the abdominal aorta and branch vessels.  No abdominal ascites.  Postsurgical changes in the right anterior abdominal wall.  Degenerative changes of the visualized lumbar spine.  IMPRESSION: Focal eccentric wall thickening in the gastric cardia at/just below the GE junction, likely corresponding to the endoscopic abnormality, worrisome for tumor.  Suspected 1.4 cm gastrohepatic nodal metastasis.  No evidence of metastatic disease in the chest.   Electronically Signed   By: Julian Hy M.D.   On: 10/18/2013 13:53      IMPRESSION: Clinical stage II versus stage III adenocarcinoma of the gastroesophageal junction.  Staging CT 10/18/2013 revealed a single enlarged gastrohepatic node. The patient will complete his staging workup late next week. Assuming no distant metastasis the patient would be a good candidate for concurrent chemotherapy and radiation therapy. I discussed the treatment course side effects and potential toxicities of radiation therapy with the patient. He appears to understand and wishes to proceed with planned course of treatment.  Patient has had recent extensive period of complete heart block noted on the patient's loop recorder (14 seconds). He may at some point require a pacemaker which hopefully will be above his anticipated radiation fields.   PLAN: I will tentatively schedule the patient for simulation on April 7 with treatments to begin the week of April 13. I anticipate approximately 5 and half weeks of radiation therapy along with radiosensitizing chemotherapy.  Total time spent in this encounter was 65  minutes.   ------------------------------------------------  Blair Promise, PhD, MD

## 2013-10-31 NOTE — Addendum Note (Signed)
Encounter addended by: Jacqulyn Liner, RN on: 10/31/2013  9:26 AM<BR>     Documentation filed: Charges VN

## 2013-11-04 ENCOUNTER — Other Ambulatory Visit: Payer: Medicare Other

## 2013-11-06 ENCOUNTER — Encounter: Payer: Self-pay | Admitting: Cardiovascular Disease

## 2013-11-06 ENCOUNTER — Ambulatory Visit (INDEPENDENT_AMBULATORY_CARE_PROVIDER_SITE_OTHER): Payer: Medicare Other | Admitting: Neurology

## 2013-11-06 ENCOUNTER — Telehealth: Payer: Self-pay | Admitting: *Deleted

## 2013-11-06 ENCOUNTER — Encounter: Payer: Self-pay | Admitting: Neurology

## 2013-11-06 VITALS — BP 156/81 | HR 70 | Wt 191.0 lb

## 2013-11-06 DIAGNOSIS — G459 Transient cerebral ischemic attack, unspecified: Secondary | ICD-10-CM

## 2013-11-06 DIAGNOSIS — I6350 Cerebral infarction due to unspecified occlusion or stenosis of unspecified cerebral artery: Secondary | ICD-10-CM

## 2013-11-06 NOTE — Patient Instructions (Addendum)
I. had a long discussion with the patient regarding results of his evaluation and answered questions. Continue aspirin for stroke prevention and strict control of lipids with LDL cholesterol goal below 70 mg percent, diabetes with hemoglobin A1c goal below 6.5% and hypertension with blood pressure goal below 130/90. Continue follow up for his cancer workup with Dr. Benay Spice. Return for followup in 3 months with Jeani Hawking, NP or call if necessary  Stroke Prevention Some medical conditions and behaviors are associated with an increased chance of having a stroke. You may prevent a stroke by making healthy choices and managing medical conditions. HOW CAN I REDUCE MY RISK OF HAVING A STROKE?   Stay physically active. Get at least 30 minutes of activity on most or all days.  Do not smoke. It may also be helpful to avoid exposure to secondhand smoke.  Limit alcohol use. Moderate alcohol use is considered to be:  No more than 2 drinks per day for men.  No more than 1 drink per day for nonpregnant women.  Eat healthy foods. This involves  Eating 5 or more servings of fruits and vegetables a day.  Following a diet that addresses high blood pressure (hypertension), high cholesterol, diabetes, or obesity.  Manage your cholesterol levels.  A diet low in saturated fat, trans fat, and cholesterol and high in fiber may control cholesterol levels.  Take any prescribed medicines to control cholesterol as directed by your health care provider.  Manage your diabetes.  A controlled-carbohydrate, controlled-sugar diet is recommended to manage diabetes.  Take any prescribed medicines to control diabetes as directed by your health care provider.  Control your hypertension.  A low-salt (sodium), low-saturated fat, low-trans fat, and low-cholesterol diet is recommended to manage hypertension.  Take any prescribed medicines to control hypertension as directed by your health care provider.  Maintain a healthy  weight.  A reduced-calorie, low-sodium, low-saturated fat, low-trans fat, low-cholesterol diet is recommended to manage weight.  Stop drug abuse.  Avoid taking birth control pills.  Talk to your health care provider about the risks of taking birth control pills if you are over 5 years old, smoke, get migraines, or have ever had a blood clot.  Get evaluated for sleep disorders (sleep apnea).  Talk to your health care provider about getting a sleep evaluation if you snore a lot or have excessive sleepiness.  Take medicines as directed by your health care provider.  For some people, aspirin or blood thinners (anticoagulants) are helpful in reducing the risk of forming abnormal blood clots that can lead to stroke. If you have the irregular heart rhythm of atrial fibrillation, you should be on a blood thinner unless there is a good reason you cannot take them.  Understand all your medicine instructions.  Make sure that other other conditions (such as anemia or atherosclerosis) are addressed. SEEK IMMEDIATE MEDICAL CARE IF:   You have sudden weakness or numbness of the face, arm, or leg, especially on one side of the body.  Your face or eyelid droops to one side.  You have sudden confusion.  You have trouble speaking (aphasia) or understanding.  You have sudden trouble seeing in one or both eyes.  You have sudden trouble walking.  You have dizziness.  You have a loss of balance or coordination.  You have a sudden, severe headache with no known cause.  You have new chest pain or an irregular heartbeat. Any of these symptoms may represent a serious problem that is an emergency. Do  not wait to see if the symptoms will go away. Get medical help at once. Call your local emergency services  (911 in [place] Do not drive yourself to the hospital. Document Released: 09/01/2004 Document Revised: 05/15/2013 Document Reviewed: 01/25/2013 [facility] Patient Information 2014 [place].

## 2013-11-06 NOTE — Progress Notes (Signed)
[facility] [address] [place]. [place] [address] [phone]       OFFICE FOLLOW UP VISIT NOTE  [name] Date of Birth:  1935-06-14 Medical Record Number:  [id number]   Referring MD:  Dr Larose Kells  Reason for Referral:  TIA  HPI:  Update 11/06/13 ; He returns for followup of her last visit 2-1/2 months ago. He continues to do well without recurrent TIA or stroke symptoms. He had TEE done on 08/23/13 which showed no evidence of cardiac clot or patent foramen ovale. He had looked core inserted by Dr. Herbie Saxon and so far paroxysmal atrial fibrillation has not been found. Transcranial Doppler study done on 09/13/13 shows slight asymmetrically increased and his aggression velocities on the right but of no clear significance. He has unfortunately been diagnosed with adenocarcinoma of the esophagus and endoscopy biopsy done by Dr. Oletta Lamas. He is currently seeing Dr. Benay Spice for completing cancer staging workup and definitive treatment. He may also need a pacemaker implanted at some point due to sinus pauses. Initial Consult 08/21/13 :79 year male with transient episode of expressive speech difficulties , dysarthria and right hand tingling and difficulty using a fork on12/30/14 lasting 30 minutes. No accompanying headache, gait, balance difficulties.He has past h/o TIA in 2000.He was admitted to [facility] and Ct head and MRI showed no acute infarct. Carotid dopplers and 2De echo were normal . He is on aspirin 81 mg and Brillinta for CAD and stents.. He has done well an dnot had recurrentt symptoms. His last HbA1c ws 6.1% and BP has been in 140-150 range though it is elevated at 181/85.  ROS:   14 system review of systems is positive for hearing loss,ringing in ears,shortness of breath,eye pain,headache,memory loss only and all other systems negative  PMH:  Past Medical History  Diagnosis Date  . Hypertension   . Hyperlipidemia     diet controlled, statins and zetia intolerant  .  Retinopathy     mild glaucomea  . Transient ischemic attack 1999  . Histoplasmosis   . Chronic cough 08/2009    -allergy profile- june 2,2011- neg. -Max GERD rx/ off oils January 07 2010 x 3 weeks only but no benefit. - Sinus CT rec July 11,2011- refused due to claustrophobia -MCT neg for asthma 03/01/10 -Add 1st Gen H1 July 11,2011- better  . Macular degeneration   . Coronary artery disease     CABG 01-2013, stent 04-2013    . Diabetes mellitus     no meds  . PONV (postoperative nausea and vomiting)     extreme nausea  . Heart murmur     TIA  . Adenocarcinoma of gastroesophageal junction   . Stroke 1995    tia 2 years ago    Social History:  History   Social History  . Marital Status: Married    Spouse Name: [name]    Number of Children: 2  . Years of Education: college   Occupational History  . reitred    Social History Main Topics  . Smoking status: Former Smoker -- 1.00 packs/day for 10 years    Types: Cigarettes    Quit date: 01/17/1977  . Smokeless tobacco: Never Used     Comment: Stopped at age 64   . Alcohol Use: No     Comment: seldom  . Drug Use: No  . Sexual Activity: No   Other Topics Concern  . 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 aspirin EC 81 MG tablet Take 1 tablet (81 mg total) by mouth daily.  90 tablet  3  . Cholecalciferol (VITAMIN D3) 2000 UNITS TABS Take 2,000 Units by mouth daily.      . Coenzyme Q10 (COQ10) 100 MG CAPS Take 100 mg by mouth daily.      . diazepam (VALIUM) 2 MG tablet Take 2 mg by mouth at bedtime as needed.       . diphenhydramine-acetaminophen (TYLENOL PM) 25-500 MG TABS Take 1 tablet by mouth at bedtime as needed (for sleep).      . finasteride (PROSCAR) 5 MG  tablet Take 5 mg by mouth every morning.       Marland Kitchen GENERLAC 10 GM/15ML SOLN 10 g as needed.       . isosorbide mononitrate (IMDUR) 60 MG 24 hr tablet Take 1 tablet (60 mg total) by mouth daily.  30 tablet  6  . KRILL OIL 1000 MG CAPS Take 1,000 mg by mouth daily.       Marland Kitchen losartan (COZAAR) 50 MG tablet Take 50 mg by mouth daily.      . metoprolol tartrate (LOPRESSOR) 25 MG tablet Take 1 tablet (25 mg total) by mouth 2 (two) times daily.  180 tablet  3  . Multiple Vitamins-Minerals (EYE VITAMINS) TABS Take 1 tablet by mouth daily.      Marland Kitchen NEXIUM 40 MG capsule Take 40 mg by mouth daily.       . nitroGLYCERIN (NITROSTAT) 0.4 MG SL tablet Place 0.4 mg under the tongue every 5 (five) minutes as needed for chest pain.      . polyethylene glycol (MIRALAX / GLYCOLAX) packet Take 17 g by mouth daily.       . pravastatin (PRAVACHOL) 40 MG tablet TAKE 1 TABLET BY MOUTH EVERY EVENING  90 tablet  3   No current facility-administered medications on file prior to visit.    Allergies:   Allergies  Allergen Reactions  . Percocet [Oxycodone-Acetaminophen] Other (See Comments)    hallucinations  . Atorvastatin Other (See Comments)    Causes myalgias   . Ezetimibe Other (See Comments)    Causes dizziness  . Lovastatin Other (See Comments)    REACTION: weakness. Patient able to tolerate pravastatin  . Ranexa [Ranolazine] Other (See Comments)    Severe constipation    Physical Exam General: well developed, well nourished, seated, in no evident distress Head: head normocephalic and atraumatic. Orohparynx benign Neck: supple with no carotid or supraclavicular bruits Cardiovascular: regular rate and rhythm, no murmurs Musculoskeletal: no deformity Skin:  no rash/petichiae Vascular:  Normal pulses all extremityies  Neurologic Exam Mental Status: Awake and fully alert. Oriented to place and time. Recent and remote memory intact. Attention span, concentration and fund of knowledge appropriate. Mood and  affect appropriate. MMSE 29/30 with 1 deficit in recall Cranial Nerves: Fundoscopic exam reveals sharp disc margins. Pupils equal, briskly reactive to light. Extraocular movements full without nystagmus. Visual fields full to confrontation. Hearing intact. Facial sensation intact. Face, tongue, palate moves normally and symmetrically.  Motor: Normal bulk and tone. Normal strength in all tested extremity muscles. Sensory.: intact to tough and pinprick and vibratory.  Coordination: Rapid alternating movements  normal in all extremities. Finger-to-nose and heel-to-shin performed accurately bilaterally. Gait and Station: Arises from chair without difficulty. Stance is normal. Gait demonstrates normal stride length and balance . Able to heel, toe and tandem walk without difficulty.  Reflexes: 1+ and symmetric. Toes downgoing.   NIHSS  0 Modified Rankin  0   ASSESSMENT:  79 year male with left hemispheric TIA in October 8676 likely of embolic etiology without definite source identified.Vascular risk factors of HT, Hyperlipidimia , Diabetes and  CAD   PLAN: I. had a long discussion with the patient regarding results of his evaluation and answered questions. Continue aspirin for stroke prevention and strict control of lipids with LDL cholesterol goal below 70 mg percent, diabetes with hemoglobin A1c goal below 6.5% and hypertension with blood pressure goal below 130/90. Continue follow up for his cancer workup with Dr. Benay Spice. Return for followup in 3 months with Jeani Hawking, NP or call if necessary

## 2013-11-06 NOTE — Telephone Encounter (Signed)
Pt was calling in regards to his pacemaker and wanted Dr. Loletha Grayer to call him. That is all the information he would give me.  [place]

## 2013-11-07 NOTE — Telephone Encounter (Signed)
Returned call and pt verified x 2.  Pt stated he would like to talk to Dr. Loletha Grayer about the pacemaker.  Stated he hasn't heard anything since the appt and the PET scan is scheduled for tomorrow morning.  Pt asking for Dr. Loletha Grayer to call him or to see him as soon as possible after the scan.  Pt informed there are no available appts sooner than his appt on 4.27.15 w/ Dr. Loletha Grayer and Dr. Loletha Grayer will be notified of his concerns to discuss the pacemaker.  Pt denied having any presyncope or syncope episodes and c/o having some heart fluttering the other night.  Stated that was the only way he could describe it.  Pt was also informed per note, Dr. Loletha Grayer mentioned that he (pt) wanted to have a better understanding of his prognosis of CA before moving forward w/ the pacemaker.  Pt verbalized understanding and agreed.  Message forwarded to Dr. Sallyanne Kuster.

## 2013-11-08 ENCOUNTER — Ambulatory Visit (HOSPITAL_COMMUNITY)
Admission: RE | Admit: 2013-11-08 | Discharge: 2013-11-08 | Disposition: A | Discharge status: Home / Self Care | Payer: Medicare Other | Source: Ambulatory Visit | Attending: Oncology | Admitting: Oncology

## 2013-11-08 DIAGNOSIS — C159 Malignant neoplasm of esophagus, unspecified: Secondary | ICD-10-CM

## 2013-11-08 DIAGNOSIS — C160 Malignant neoplasm of cardia: Secondary | ICD-10-CM | POA: Insufficient documentation

## 2013-11-08 LAB — GLUCOSE, CAPILLARY: Glucose-Capillary: 111 mg/dL — ABNORMAL HIGH (ref 70–99)

## 2013-11-08 MED ORDER — FLUDEOXYGLUCOSE F - 18 (FDG) INJECTION
9.6000 | Freq: Once | INTRAVENOUS | Status: AC | PRN
  Administered 2013-11-08: 9.6 via INTRAVENOUS

## 2013-11-10 ENCOUNTER — Other Ambulatory Visit: Payer: Self-pay | Admitting: Oncology

## 2013-11-11 ENCOUNTER — Ambulatory Visit: Payer: Medicare Other

## 2013-11-11 ENCOUNTER — Ambulatory Visit: Payer: Medicare Other | Admitting: Radiation Oncology

## 2013-11-12 ENCOUNTER — Telehealth: Payer: Self-pay | Admitting: Cardiovascular Disease

## 2013-11-12 ENCOUNTER — Ambulatory Visit
Admission: RE | Admit: 2013-11-12 | Discharge: 2013-11-12 | Disposition: A | Discharge status: Home / Self Care | Payer: Medicare Other | Source: Ambulatory Visit | Attending: Radiation Oncology | Admitting: Radiation Oncology

## 2013-11-12 DIAGNOSIS — C160 Malignant neoplasm of cardia: Secondary | ICD-10-CM

## 2013-11-12 NOTE — Telephone Encounter (Signed)
Santiago Glad stated she needs to fax a clearance form for pt to start radiology and wanted the best fax number.  Advised she fax it to [phone].  Informed Dr. Sallyanne Kuster is out of the office this week.  Santiago Glad stated it is urgent and informed another provider will be notified to review.  Verbalized understanding.  Forwarded to B. Lassiter, CMA.  Medical Records - Please look for fax and give to [facility] once received.  Thanks. ~ [employer]. Nicki Reaper, BSN, RN

## 2013-11-12 NOTE — Telephone Encounter (Signed)
Device form sent to [facility] to be filled out and faxed for cancer treatments.

## 2013-11-13 ENCOUNTER — Ambulatory Visit (HOSPITAL_BASED_OUTPATIENT_CLINIC_OR_DEPARTMENT_OTHER): Payer: Medicare Other | Admitting: Oncology

## 2013-11-13 ENCOUNTER — Telehealth: Payer: Self-pay | Admitting: Oncology

## 2013-11-13 VITALS — BP 146/73 | HR 60 | Temp 98.0°F | Resp 18 | Ht 71.5 in | Wt 188.3 lb

## 2013-11-13 DIAGNOSIS — C160 Malignant neoplasm of cardia: Secondary | ICD-10-CM

## 2013-11-13 MED ORDER — PROCHLORPERAZINE MALEATE 10 MG PO TABS: 10.0000 mg | ORAL_TABLET | Freq: Four times a day (QID) | ORAL | Status: DC | PRN

## 2013-11-13 MED ORDER — DEXAMETHASONE 2 MG PO TABS: ORAL_TABLET | ORAL | Status: DC

## 2013-11-13 NOTE — Telephone Encounter (Signed)
gv adn printed aptp sched and avs for pt for April and May....Marland Kitchensed added tx.

## 2013-11-13 NOTE — Progress Notes (Signed)
  Charles [name] OFFICE PROGRESS NOTE   Diagnosis: Gastroesophagitis cancer  INTERVAL HISTORY:   He returns as scheduled. He saw Dr. Sondra Come and is scheduled to begin radiation 11/19/2013. He is tolerating a diet. He reports dysphagia with certain foods such as "apples and grapes ". He has developed right mid abdominal pain for the past few weeks. No associated symptoms.  Charles [name] attended a chemotherapy teaching class.  Objective:  Vital signs in last 24 hours:  Blood pressure 146/73, pulse 60, temperature 98 F (36.7 C), temperature source Oral, resp. rate 18, height 5' 11.5" (1.816 m), weight 188 lb 4.8 oz (85.412 kg).   Resp: lungs clear bilaterally Cardio: regular rate and rhythm GI: nontender, no hepatomegaly, no mass Vascular: no leg edema   Lab Results:  Lab Results  Component Value Date   WBC 7.3 10/17/2013   HGB 13.9 10/17/2013   HCT 41.3 10/17/2013   MCV 93.4 10/17/2013   PLT 207 10/17/2013   NEUTROABS 3.9 10/17/2013     Imaging:PET scan 11/08/2013-hypermetabolic lesion at the GE junction and hypermetabolic activity in a gastrohepatic ligament lymph node. No evidence of distant metastatic disease.  Medications: I have reviewed the patient's current medications.  Assessment/Plan: 1.clinical stage II versus stage III adenocarcinoma of the gastroesophageal junction, staging CT 10/18/2013 with a single enlarged gastrohepatic node   PET scan 11/08/2013 with hypermetabolism at the GE junction and a gastrohepatic lymph node 2. History of coronary artery disease-status post coronary artery bypass surgery and placement of coronary stents  3. History of a TIA on 2 occasions  4. Remote history of histoplasmosis  5. Diabetes  6. Hypertension     Disposition:  Charles [name] has been diagnosed with clinical stage III adenocarcinoma of the gastroesophageal junction. The plan is to proceed with concurrent chemotherapy and radiation. He has attended a  chemotherapy teaching class.  He will begin radiation 11/19/2013 with the plan to begin chemotherapy 11/21/2013. We again reviewed the potential toxicities associated with the Taxol/carboplatin regimen including the chance for nausea/vomiting, mucositis, alopecia, and hematologic toxicity. We reviewed the allergic reaction, neuropathy, and bone pain associated with Taxol.  He will receive Decadron premedication with week #1. He will check the blood sugar for a few days after receiving Decadron and contact us for a blood sugar greater than 400.  Charles [name] will be seen for an office visit with week #2 chemotherapy on 11/28/2013.  He plans to followup with Dr. Sallyanne Kuster to consider pacemaker placement.  Ladell Pier, MD  11/13/2013  2:59 PM

## 2013-11-13 NOTE — Telephone Encounter (Signed)
Per [id number] device form filled out and faxed yesterday pre radiation.

## 2013-11-14 NOTE — Progress Notes (Signed)
  Radiation Oncology         ([phone]) [phone] ________________________________  Name: Charles [name] MRN: [id number]  Date: 11/12/2013  DOB: 10/25/1934  SIMULATION AND TREATMENT PLANNING NOTE  DIAGNOSIS:  DIAGNOSIS: Gastroesophageal cancer  Primary site: Esophagus - Adenocarcinoma  Staging method: AJCC 7th Edition  Clinical: (TX, N1, M0)  Summary: (TX, N1, M0)   NARRATIVE:  The patient was brought to the [facility].  Identity was confirmed.  All relevant records and images related to the planned course of therapy were reviewed.  The patient freely provided informed written consent to proceed with treatment after reviewing the details related to the planned course of therapy. The consent form was witnessed and verified by the simulation staff.  Then, the patient was set-up in a stable reproducible  supine position for radiation therapy.  CT images were obtained.  Surface markings were placed.  The CT images were loaded into the planning software.  Then the target and avoidance structures were contoured.  Treatment planning then occurred.  The radiation prescription was entered and confirmed.  Then, I designed and supervised the construction of a total of 1 medically necessary complex treatment devices.  I have requested : Intensity Modulated Radiotherapy (IMRT) is medically necessary for this case for the following reason:  Cardiac sparing.  I have ordered:dose calc.  PLAN:  The patient will receive 50.4 Gy in 28 fractions along with radiosensitizing chemotherapy..  ________________________________  Special treatment procedure note  The patient will be receiving radiosensitizing chemotherapy throughout his course of treatment. Given the increased potential for toxicities as well as the necessity for close monitoring of the patient and bloodwork, this constitutes a special treatment procedure   Rulon Sera.D.

## 2013-11-17 ENCOUNTER — Other Ambulatory Visit: Payer: Self-pay | Admitting: Oncology

## 2013-11-17 DIAGNOSIS — C160 Malignant neoplasm of cardia: Secondary | ICD-10-CM

## 2013-11-18 ENCOUNTER — Telehealth: Payer: Self-pay | Admitting: Cardiovascular Disease

## 2013-11-18 ENCOUNTER — Telehealth: Payer: Self-pay | Admitting: Oncology

## 2013-11-18 ENCOUNTER — Encounter: Payer: Self-pay | Admitting: Internal Medicine

## 2013-11-18 ENCOUNTER — Ambulatory Visit: Payer: Medicare Other | Admitting: Internal Medicine

## 2013-11-18 ENCOUNTER — Ambulatory Visit (INDEPENDENT_AMBULATORY_CARE_PROVIDER_SITE_OTHER): Payer: Medicare Other | Admitting: Internal Medicine

## 2013-11-18 ENCOUNTER — Ambulatory Visit: Payer: Medicare Other | Admitting: Oncology

## 2013-11-18 VITALS — BP 152/69 | HR 64 | Temp 98.4°F | Wt 189.0 lb

## 2013-11-18 DIAGNOSIS — G459 Transient cerebral ischemic attack, unspecified: Secondary | ICD-10-CM

## 2013-11-18 DIAGNOSIS — I10 Essential (primary) hypertension: Secondary | ICD-10-CM

## 2013-11-18 DIAGNOSIS — I6350 Cerebral infarction due to unspecified occlusion or stenosis of unspecified cerebral artery: Secondary | ICD-10-CM

## 2013-11-18 DIAGNOSIS — C160 Malignant neoplasm of cardia: Secondary | ICD-10-CM

## 2013-11-18 DIAGNOSIS — E119 Type 2 diabetes mellitus without complications: Secondary | ICD-10-CM

## 2013-11-18 MED ORDER — LACTULOSE ENCEPHALOPATHY 10 GM/15ML PO SOLN: 10.0000 g | ORAL | Status: DC | PRN

## 2013-11-18 MED ORDER — DIAZEPAM 2 MG PO TABS: 2.0000 mg | ORAL_TABLET | Freq: Every evening | ORAL | Status: DC | PRN

## 2013-11-18 NOTE — Progress Notes (Signed)
Subjective:    Patient ID: Charles [name], male    DOB: 11-23-1934, 79 y.o.   MRN: [id number]  DOS:  11/18/2013 Type of  visit: Routine visit Since the last time he was here, he was diagnosed with cancer, to start radiation and chemotherapy soon. In general he feels optimistic. Has questions about TIA management   ROS Denies major problems with anxiety, depression.   Past Medical History  Diagnosis Date  . Hypertension   . Hyperlipidemia     diet controlled, statins and zetia intolerant  . Retinopathy     mild glaucomea  . Transient ischemic attack 1999  . Histoplasmosis   . Chronic cough 08/2009    -allergy profile- june 2,2011- neg. -Max GERD rx/ off oils January 07 2010 x 3 weeks only but no benefit. - Sinus CT rec July 11,2011- refused due to claustrophobia -MCT neg for asthma 03/01/10 -Add 1st Gen H1 July 11,2011- better  . Macular degeneration   . Coronary artery disease     CABG 01-2013, stent 04-2013    . Diabetes mellitus     no meds  . PONV (postoperative nausea and vomiting)     extreme nausea  . Heart murmur     TIA  . Adenocarcinoma of gastroesophageal junction   . Stroke 1995    tia 2 years ago    Past Surgical History  Procedure Laterality Date  . Cervical spine surgery  2002    plate  . Cholecystectomy  1985  . Percutaneous coronary stent intervention (pci-s)  101/31/2014    Dr[facility]ARDING  . Coronary artery bypass graft N/A 01/21/2013    Procedure: Coronary Arery Bypass Grafting  Times Five Using Left Internal Mammary Artery and Right Saphenous Leg Vein Harvested Endoscopically;  Surgeon: Melrose Nakayama, MD;  Location: [facility];  Service: Open Heart Surgery;  Laterality: N/A;  . Tee without cardioversion N/A 08/23/2013    Procedure: TRANSESOPHAGEAL ECHOCARDIOGRAM (TEE);  Surgeon: Sanda Klein, MD;  Location: [facility] ENDOSCOPY;  Service: Cardiovascular;  Laterality: N/A;  . Esophagogastroduodenoscopy N/A 10/17/2013    Procedure: ESOPHAGOGASTRODUODENOSCOPY  (EGD);  Surgeon: Winfield Cunas., MD;  Location: [facility] ENDOSCOPY;  Service: Endoscopy;  Laterality: N/A;  . Appendectomy      History   Social History  . Marital Status: Married    Spouse Name: [name]    Number of Children: 2  . Years of Education: college   Occupational History  . reitred    Social History Main Topics  . Smoking status: Former Smoker -- 1.00 packs/day for 10 years    Types: Cigarettes    Quit date: 01/17/1977  . Smokeless tobacco: Never Used     Comment: Stopped at age 86   . Alcohol Use: No     Comment: seldom  . Drug Use: No  . Sexual Activity: No   Other Topics Concern  . 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  Always use your most recent med list.               aspirin EC 81 MG tablet  Take 1 tablet (81 mg total) by mouth daily.  CoQ10 100 MG Caps  Take 100 mg by mouth daily.     dexamethasone 2 MG tablet  Commonly known as:  DECADRON  Take #5 (10 mg) at bedtime night before 1st chemo and #5 (10 mg) at 6 am day of 1st chemo     diazepam 2 MG tablet  Commonly known as:  VALIUM  Take 1 tablet (2 mg total) by mouth at bedtime as needed.     diphenhydramine-acetaminophen 25-500 MG Tabs  Commonly known as:  TYLENOL PM  Take 1 tablet by mouth at bedtime as needed (for sleep).     EYE VITAMINS Tabs  Take 1 tablet by mouth daily.     finasteride 5 MG tablet  Commonly known as:  PROSCAR  Take 5 mg by mouth every morning.     isosorbide mononitrate 60 MG 24 hr tablet  Commonly known as:  IMDUR  Take 1 tablet (60 mg total) by mouth daily.     lactulose (encephalopathy) 10 GM/15ML Soln  Commonly known as:  GENERLAC  Take 15 mLs (10 g total) by mouth as needed.     losartan 50 MG tablet  Commonly  known as:  COZAAR  Take 50 mg by mouth daily.     metoprolol tartrate 25 MG tablet  Commonly known as:  LOPRESSOR  Take 1 tablet (25 mg total) by mouth 2 (two) times daily.     NEXIUM 40 MG capsule  Generic drug:  esomeprazole  Take 40 mg by mouth daily.     nitroGLYCERIN 0.4 MG SL tablet  Commonly known as:  NITROSTAT  Place 0.4 mg under the tongue every 5 (five) minutes as needed for chest pain.     polyethylene glycol packet  Commonly known as:  MIRALAX / GLYCOLAX  Take 17 g by mouth daily.     pravastatin 40 MG tablet  Commonly known as:  PRAVACHOL  TAKE 1 TABLET BY MOUTH EVERY EVENING     prochlorperazine 10 MG tablet  Commonly known as:  COMPAZINE  Take 1 tablet (10 mg total) by mouth every 6 (six) hours as needed for nausea.     Vitamin D3 2000 UNITS Tabs  Take 2,000 Units by mouth daily.           Objective:   Physical Exam BP 152/69  Pulse 64  Temp(Src) 98.4 F (36.9 C)  Wt 189 lb (85.73 kg)  SpO2 99%  General -- alert, well-developed, NAD.   Psych-- Cognition and judgment appear intact. Cooperative with normal attention span and concentration. No anxious or depressed appearing.       Assessment & Plan:

## 2013-11-18 NOTE — Assessment & Plan Note (Signed)
Patient concerned about the issue, see cardiology soon.

## 2013-11-18 NOTE — Assessment & Plan Note (Signed)
BP slightly elevated today, no change

## 2013-11-18 NOTE — Assessment & Plan Note (Signed)
As part of his chemotherapy, he will use Decadron, recommend to check CBGs and call if they are consistently more than 250

## 2013-11-18 NOTE — Telephone Encounter (Signed)
Call from Santiago Glad w/ Radiation Oncology.  Stated she called about clearance for Mr. Mcclafferty and sees in Epic the form was faxed.  She did not receive form.  Asking if it could be sent back.  Informed RN will notify Medical Records staff to locate form and fax back.  Verbalized understanding.  Form should be faxed to [phone], Attn: Santiago Glad.  Jeani Hawking in Medical Records notified.  Message sent to Medical Records Pool.

## 2013-11-18 NOTE — Assessment & Plan Note (Addendum)
Has questions about TIA mngmt:  recommend good control of CV RF

## 2013-11-18 NOTE — Telephone Encounter (Signed)
Talked to [employer], RN at Dr. Victorino December office regarding Charles [name] pacemaker form for his loop recorder.  Amber is going to check with medical records to have the form refaxed to [phone].  Advised Amber that we need it today because he starts radiation tomorrow.

## 2013-11-18 NOTE — Assessment & Plan Note (Signed)
Emotional support provided, patient to call me as needed for questions.

## 2013-11-18 NOTE — Patient Instructions (Signed)
Next visit in 3 months  Check your blood sugars once or twice a day, call if your blood sugars are consistently more than 250

## 2013-11-18 NOTE — Progress Notes (Signed)
Pre visit review using our clinic review tool, if applicable. No additional management support is needed unless otherwise documented below in the visit note. 

## 2013-11-19 ENCOUNTER — Ambulatory Visit
Admission: RE | Admit: 2013-11-19 | Discharge: 2013-11-19 | Disposition: A | Discharge status: Home / Self Care | Payer: Medicare Other | Source: Ambulatory Visit | Attending: Radiation Oncology | Admitting: Radiation Oncology

## 2013-11-19 ENCOUNTER — Encounter: Payer: Self-pay | Admitting: Oncology

## 2013-11-19 ENCOUNTER — Ambulatory Visit (INDEPENDENT_AMBULATORY_CARE_PROVIDER_SITE_OTHER): Payer: Medicare Other | Admitting: Cardiology

## 2013-11-19 ENCOUNTER — Encounter: Payer: Self-pay | Admitting: Radiation Oncology

## 2013-11-19 VITALS — BP 174/71 | HR 71 | Resp 16 | Wt 189.0 lb

## 2013-11-19 DIAGNOSIS — R001 Bradycardia, unspecified: Secondary | ICD-10-CM

## 2013-11-19 DIAGNOSIS — I498 Other specified cardiac arrhythmias: Secondary | ICD-10-CM

## 2013-11-19 DIAGNOSIS — C160 Malignant neoplasm of cardia: Secondary | ICD-10-CM

## 2013-11-19 DIAGNOSIS — I6350 Cerebral infarction due to unspecified occlusion or stenosis of unspecified cerebral artery: Secondary | ICD-10-CM

## 2013-11-19 MED ORDER — SUCRALFATE 1 GM/10ML PO SUSP: 1.0000 g | Freq: Three times a day (TID) | ORAL | Status: DC

## 2013-11-19 NOTE — Progress Notes (Signed)
  Radiation Oncology         [phone] ________________________________  Name: Charles [name] MRN: [id number]  Date: 11/19/2013  DOB: [birth year]  Weekly Radiation Therapy Management  Current Dose: 1.8 Gy     Planned Dose:  50.4 Gy  Narrative . . . . . . . . The patient presents for routine under treatment assessment.                                   The patient is without complaint.                                 Set-up films were reviewed.                                 The chart was checked. Physical Findings. . .  weight is 189 lb (85.73 kg). His blood pressure is 174/71 and his pulse is 71. His respiration is 16. . Weight essentially stable.  No significant changes. Impression . . . . . . . The patient is tolerating radiation. Plan . . . . . . . . . . . . Continue treatment as planned. He will be placed on Carafate.  ________________________________   Blair Promise, PhD, MD

## 2013-11-19 NOTE — Progress Notes (Signed)
He is tolerating a diet. He reports dysphagia with certain foods such as "apples and grapes ". Denies fatigue or pain at this time. Oriented patient to staff and routine of the clinic. Provided patient with radiation therapy and you handbook then, reviewed pertinent information. Allowed patient opportunity to ask questions and answered them to the best of my ability. Pleasant affect noted. BP slight elevated.

## 2013-11-20 ENCOUNTER — Ambulatory Visit
Admission: RE | Admit: 2013-11-20 | Discharge: 2013-11-20 | Disposition: A | Discharge status: Home / Self Care | Payer: Medicare Other | Source: Ambulatory Visit | Attending: Radiation Oncology | Admitting: Radiation Oncology

## 2013-11-21 ENCOUNTER — Ambulatory Visit
Admission: RE | Admit: 2013-11-21 | Discharge: 2013-11-21 | Disposition: A | Discharge status: Home / Self Care | Payer: Medicare Other | Source: Ambulatory Visit | Attending: Radiation Oncology | Admitting: Radiation Oncology

## 2013-11-21 ENCOUNTER — Ambulatory Visit (HOSPITAL_BASED_OUTPATIENT_CLINIC_OR_DEPARTMENT_OTHER): Payer: Medicare Other

## 2013-11-21 ENCOUNTER — Other Ambulatory Visit (HOSPITAL_BASED_OUTPATIENT_CLINIC_OR_DEPARTMENT_OTHER): Payer: Medicare Other

## 2013-11-21 ENCOUNTER — Other Ambulatory Visit: Payer: Self-pay | Admitting: Oncology

## 2013-11-21 ENCOUNTER — Ambulatory Visit: Payer: Medicare Other | Admitting: Nutrition

## 2013-11-21 ENCOUNTER — Encounter: Payer: Self-pay | Admitting: Cardiology

## 2013-11-21 VITALS — BP 166/71 | HR 79 | Temp 97.8°F | Resp 18

## 2013-11-21 DIAGNOSIS — C160 Malignant neoplasm of cardia: Secondary | ICD-10-CM

## 2013-11-21 DIAGNOSIS — Z5111 Encounter for antineoplastic chemotherapy: Secondary | ICD-10-CM

## 2013-11-21 LAB — COMPREHENSIVE METABOLIC PANEL (CC13)
ALT: 17 U/L (ref 0–55)
ANION GAP: 12 meq/L — AB (ref 3–11)
AST: 19 U/L (ref 5–34)
Albumin: 4.1 g/dL (ref 3.5–5.0)
Alkaline Phosphatase: 74 U/L (ref 40–150)
BUN: 32.7 mg/dL — ABNORMAL HIGH (ref 7.0–26.0)
CALCIUM: 10 mg/dL (ref 8.4–10.4)
CHLORIDE: 108 meq/L (ref 98–109)
CO2: 20 mEq/L — ABNORMAL LOW (ref 22–29)
Creatinine: 1.5 mg/dL — ABNORMAL HIGH (ref 0.7–1.3)
Glucose: 261 mg/dl — ABNORMAL HIGH (ref 70–140)
POTASSIUM: 3.9 meq/L (ref 3.5–5.1)
SODIUM: 141 meq/L (ref 136–145)
TOTAL PROTEIN: 7.8 g/dL (ref 6.4–8.3)
Total Bilirubin: 0.86 mg/dL (ref 0.20–1.20)

## 2013-11-21 MED ORDER — SODIUM CHLORIDE 0.9 % IV SOLN
Freq: Once | INTRAVENOUS | Status: AC
  Administered 2013-11-21: 13:00:00 via INTRAVENOUS

## 2013-11-21 MED ORDER — FAMOTIDINE IN NACL 20-0.9 MG/50ML-% IV SOLN
INTRAVENOUS | Status: AC
  Filled 2013-11-21: qty 50

## 2013-11-21 MED ORDER — ONDANSETRON 16 MG/50ML IVPB (CHCC)
INTRAVENOUS | Status: AC
  Filled 2013-11-21: qty 16

## 2013-11-21 MED ORDER — DEXAMETHASONE SODIUM PHOSPHATE 10 MG/ML IJ SOLN
10.0000 mg | Freq: Once | INTRAMUSCULAR | Status: AC
  Administered 2013-11-21: 10 mg via INTRAVENOUS

## 2013-11-21 MED ORDER — SODIUM CHLORIDE 0.9 % IV SOLN
50.0000 mg/m2 | Freq: Once | INTRAVENOUS | Status: AC
  Administered 2013-11-21: 102 mg via INTRAVENOUS
  Filled 2013-11-21: qty 17

## 2013-11-21 MED ORDER — DEXAMETHASONE SODIUM PHOSPHATE 20 MG/5ML IJ SOLN
INTRAMUSCULAR | Status: AC
  Filled 2013-11-21: qty 5

## 2013-11-21 MED ORDER — DIPHENHYDRAMINE HCL 50 MG/ML IJ SOLN
25.0000 mg | Freq: Once | INTRAMUSCULAR | Status: AC
  Administered 2013-11-21: 25 mg via INTRAVENOUS

## 2013-11-21 MED ORDER — FAMOTIDINE IN NACL 20-0.9 MG/50ML-% IV SOLN
20.0000 mg | Freq: Once | INTRAVENOUS | Status: AC
  Administered 2013-11-21: 20 mg via INTRAVENOUS

## 2013-11-21 MED ORDER — ONDANSETRON 16 MG/50ML IVPB (CHCC)
16.0000 mg | Freq: Once | INTRAVENOUS | Status: AC
  Administered 2013-11-21: 16 mg via INTRAVENOUS

## 2013-11-21 MED ORDER — SODIUM CHLORIDE 0.9 % IV SOLN
150.8000 mg | Freq: Once | INTRAVENOUS | Status: AC
  Administered 2013-11-21: 150 mg via INTRAVENOUS
  Filled 2013-11-21: qty 15

## 2013-11-21 MED ORDER — DIPHENHYDRAMINE HCL 50 MG/ML IJ SOLN
INTRAMUSCULAR | Status: AC
  Filled 2013-11-21: qty 1

## 2013-11-21 NOTE — Telephone Encounter (Signed)
Spoke with pt in infusion room with response to 4/14 email. Per Dr. Benay Spice: 5 year survival is estimated around 20%. If we don't treat the cancer, he may have increased difficulty swallowing, bleeding or pain. Will discuss at next visit. Pt voiced understanding.

## 2013-11-21 NOTE — Progress Notes (Signed)
Charles [name] ID: Charles [name], male   DOB: 1935/04/23, 79 y.o.   MRN: [id number]    11/21/2013 ESTES LEHNER   06-25-1935  [id number]  Primary [place], [place] Primary Cardiologist: Dr. Sallyanne Kuster  HPI: Charles [name] returns to clinic today for further discussion regarding the need for a PPM. Charles [name] is followed by Dr. Sallyanne Kuster. Charles [name] has a history of CAD, s/p CABG in June 2013. Since then, Charles [name] has suffered occlusions of 2 of his bypass grafts and subsequently required stenting of the ramus intermedius and RCA. In January of this year, Charles [name] suffered a TIA. This remains unexplained. Transesophageal echocardiography did not reveal any cardioembolic sources and carotid duplex ultrasonography was also nondiagnostic. Charles [name] received an implantable loop recorder to monitor for possible occult atrial fibrillation. Interrogation of his loop recorder, during his last office visit with Dr. Sallyanne Kuster in March, showed serious bradyarrhythmias with pauses. Several of these episodes were consistent with second degree atrial ventricular block, Mobit Type I (Wenckebach cycles). However, one in particular was concerning for complete heart block, lasting for almost 14 seconds. All episodes were asymptomatic. Based on the findings captured by the loop recorder, Dr. 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 Dr. Lorenza Cambridge felt that as long as Charles [name] remained completely asymptomatic, that this was not entirely unreasonable. Charles [name] instructed Charles [name] to cal immediately if Charles [name] developed syncope or presyncope.  Charles [name] returns today for further discussion. Charles [name] has meet with Dr. Sondra Come, radiation oncologist, and it has been decided to begin treatment with radiation / chemotherapy. However, the Charles [name] is still frustrated  at the fact that Charles [name] hasn't received an estimated prognosis. Thus, Charles [name] has yet to make a decision regarding a pacemaker. Charles [name] feels that if his overall prognosis is < 1 year, then Charles [name] feels no need to undergo PPM insertion. However, if his overall prognosis is good then Charles [name] would proceed. Charles [name] states that Charles [name] feels that Charles [name] has been left in the dark regarding the facts of his malignancy and feels that if Charles [name] had a better understanding then Charles [name] could reach a decision regarding a pacemaker.  With the permission of Charles [name], I called the office of Dr. Sondra Come and spoke with his RN regarding the duration of his radiation/ chemotherapy. I was informed that the plan is for Charles [name] to undergo 5 weeks of radiation, starting today (11/19/13), and 4 weeks of chemotherapy. Charles [name] will undergo a repeat PET scan in 6 weeks, after which Dr. Sondra Come will hopefully be able to provide an estimate of his prognosis, based on his response to treatment.  This information was relayed to the Charles [name].  From a cardiac standpoint, Charles [name] has denied any symptoms since his last OV with Dr. Sallyanne Kuster. Charles [name] denies syncope, presyncope, dizziness, fatigue, SOB and chest pain. His loop recorder was interrogated in the office today and demonstrated no bradycardia, no pauses/ heart blocks.      Current Outpatient Prescriptions  Medication Sig Dispense Refill  . aspirin EC 81 MG tablet Take 1 tablet (81 mg total) by mouth daily.  90 tablet  3  . Cholecalciferol (VITAMIN D3) 2000 UNITS TABS Take 2,000 Units by mouth daily.      . Coenzyme Q10 (COQ10) 100 MG CAPS Take 100 mg by mouth daily.      Marland Kitchen  dexamethasone (DECADRON) 2 MG tablet Take #5 (10 mg) at bedtime night before 1st chemo and #5 (10 mg) at 6 am day of 1st chemo  10 tablet  0  . diazepam (VALIUM) 2 MG tablet Take 1 tablet (2 mg total) by mouth at bedtime as needed.  30 tablet  4  . diphenhydramine-acetaminophen (TYLENOL PM) 25-500 MG TABS Take 1 tablet by mouth at bedtime as needed (for sleep).      .  finasteride (PROSCAR) 5 MG tablet Take 5 mg by mouth every morning.       . isosorbide mononitrate (IMDUR) 60 MG 24 hr tablet Take 1 tablet (60 mg total) by mouth daily.  30 tablet  6  . lactulose, encephalopathy, (GENERLAC) 10 GM/15ML SOLN Take 15 mLs (10 g total) by mouth as needed.  2700 mL  3  . losartan (COZAAR) 50 MG tablet Take 50 mg by mouth daily.      . metoprolol tartrate (LOPRESSOR) 25 MG tablet Take 1 tablet (25 mg total) by mouth 2 (two) times daily.  180 tablet  3  . Multiple Vitamins-Minerals (EYE VITAMINS) TABS Take 1 tablet by mouth daily.      Marland Kitchen NEXIUM 40 MG capsule Take 40 mg by mouth daily.       . nitroGLYCERIN (NITROSTAT) 0.4 MG SL tablet Place 0.4 mg under the tongue every 5 (five) minutes as needed for chest pain.      . polyethylene glycol (MIRALAX / GLYCOLAX) packet Take 17 g by mouth daily.       . pravastatin (PRAVACHOL) 40 MG tablet TAKE 1 TABLET BY MOUTH EVERY EVENING  90 tablet  3  . prochlorperazine (COMPAZINE) 10 MG tablet Take 1 tablet (10 mg total) by mouth every 6 (six) hours as needed for nausea.  60 tablet  1  . sucralfate (CARAFATE) 1 GM/10ML suspension Take 10 mLs (1 g total) by mouth 4 (four) times daily -  with meals and at bedtime.  420 mL  0   No current facility-administered medications for this visit.    Allergies  Allergen Reactions  . Percocet [Oxycodone-Acetaminophen] Other (See Comments)    hallucinations  . Atorvastatin Other (See Comments)    Causes myalgias   . Ezetimibe Other (See Comments)    Causes dizziness  . Lovastatin Other (See Comments)    REACTION: weakness. Charles [name] able to tolerate pravastatin  . Ranexa [Ranolazine] Other (See Comments)    Severe constipation    History   Social History  . Marital Status: Married    Spouse Name: [name]    Number of Children: 2  . Years of Education: college   Occupational History  . reitred    Social History Main Topics  . Smoking status: Former Smoker -- 1.00 packs/day for 10  years    Types: Cigarettes    Quit date: 01/17/1977  . Smokeless tobacco: Never Used     Comment: Stopped at age 66   . Alcohol Use: No     Comment: seldom  . Drug Use: No  . Sexual Activity: No   Other Topics Concern  . 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  Cardiovascular: negative for chest pain,  dyspnea on exertion, edema, orthopnea, palpitations, paroxysmal nocturnal dyspnea or shortness of breath Dermatological: negative for rash Respiratory: negative for cough or wheezing Urologic: negative for hematuria Abdominal: negative for nausea, vomiting, diarrhea, bright red blood per rectum, melena, or hematemesis Neurologic: negative for visual changes, syncope, or dizziness All other systems reviewed and are otherwise negative except as noted above.    There were no vitals taken for this visit.  General appearance: alert, cooperative and no distress Neck: no carotid bruit and no JVD Lungs: clear to auscultation bilaterally Heart: regular rate and rhythm, S1, S2 normal, no murmur, click, rub or gallop Extremities: no LEE Pulses: 2+ and symmetric Skin: warm and dry Neurologic: Grossly normal    ASSESSMENT AND PLAN:   Loop recorder interrogation today demonstrated no further recurrence of bradyarthria, including no pauses and no heart blocks. Charles [name] denies any symptoms of syncope, presyncope, dizziness, fatigue, SOB and chest pain.   PLAN  After a detailed discussion, it has been decided that since his has had no further reoccurrence of bradyarthrias, including no pauses and no heart blocks, that it would be reasonable to wait until after Charles [name] completes radiation and chemotherapy to revisit the idea of undergoing PPM insertion. Charles [name]  is scheduled to undergo repeat PET scan in 6 weeks. I have instructed Charles [name] to arrange f/u with Dr. Sallyanne Kuster in 7 weeks. Hopefully by that time Charles [name] will have a better idea of his overall diagnosis. Charles [name] was instructed that if Charles [name] develops any syncope/ near syncope to seek urgent medical evaluation. If Charles [name] has recurrent heart block, then Charles [name] may require emergent PPM insertion, even before Charles [name] finishes treatments. Charles [name] verbalized understanding. Dr. Sallyanne Kuster was also involved in this discussion and agrees to the above plan.   Note: > than 45 minutes of Charles [name] care was devoted today including time I spent with the Charles [name] discussing the need for a pacemaker, physical examination, interrogation of his loop recorder, the time spent over the phone with Dr. Clabe Seal office discussing the treatment plan for his esophageal malignancy and the time Dr. Knox Saliva spent with the Charles [name].   Brittainy SimmonsPA-C 11/21/2013 12:59 PM

## 2013-11-21 NOTE — Patient Instructions (Signed)
[facility] Discharge Instructions for Patients Receiving Chemotherapy  Today you received the following chemotherapy agents: Taxol and Carboplatin.  To help prevent nausea and vomiting after your treatment, we encourage you to take your nausea medication as prescribed.   If you develop nausea and vomiting that is not controlled by your nausea medication, call the clinic.   BELOW ARE SYMPTOMS THAT SHOULD BE REPORTED IMMEDIATELY:  *FEVER GREATER THAN 100.5 F  *CHILLS WITH OR WITHOUT FEVER  NAUSEA AND VOMITING THAT IS NOT CONTROLLED WITH YOUR NAUSEA MEDICATION  *UNUSUAL SHORTNESS OF BREATH  *UNUSUAL BRUISING OR BLEEDING  TENDERNESS IN MOUTH AND THROAT WITH OR WITHOUT PRESENCE OF ULCERS  *URINARY PROBLEMS  *BOWEL PROBLEMS  UNUSUAL RASH Items with * indicate a potential emergency and should be followed up as soon as possible.  Feel free to call the clinic you have any questions or concerns. The clinic phone number is [phone].    

## 2013-11-21 NOTE — Progress Notes (Signed)
Patient reports he is eating fine.  He did experience some soreness in his throat after radiation.  The medication prescribed for him works well.  He is having no difficulties eating at this time and is consuming 3 meals with 2 snacks.  Patient wonders about oral nutrition supplements with less sugar.  Nutrition diagnosis: Food and nutrition related knowledge deficit continues.  Intervention: Educated patient on strategies for improved glycemic control while eating small, frequent meals throughout the day.  Recommended patient could tried Glucerna or boost glucose goal for additional protein and calories.  Provided coupons.  Provided a list of high protein foods for patient.  Questions were answered.  Teach back method used.  Monitoring, evaluation, goals: Patient will tolerate adequate calories and protein for minimal weight loss.  Next visit: Thursday, April 23, during chemotherapy.

## 2013-11-22 ENCOUNTER — Ambulatory Visit: Payer: Medicare Other | Admitting: Radiation Oncology

## 2013-11-22 ENCOUNTER — Telehealth: Payer: Self-pay

## 2013-11-22 ENCOUNTER — Ambulatory Visit
Admission: RE | Admit: 2013-11-22 | Discharge: 2013-11-22 | Disposition: A | Discharge status: Home / Self Care | Payer: Medicare Other | Source: Ambulatory Visit | Attending: Radiation Oncology | Admitting: Radiation Oncology

## 2013-11-22 ENCOUNTER — Ambulatory Visit: Payer: Medicare Other

## 2013-11-22 NOTE — Telephone Encounter (Signed)
Spoke with wife Charles [name].  Charles [name] is doing well.  He is tired.  Denies nausea, vomiting,or aches.  He is constipated and took a laxative this morning.  No results yet.  Patient and wife know to call [phone] if any issues or concerns arise.

## 2013-11-22 NOTE — Telephone Encounter (Signed)
Message copied by Baruch Merl on Fri Nov 22, 2013 12:54 PM ------      Message from: Christa See      Created: Thu Nov 21, 2013  3:23 PM      Regarding: 1st time chemo f/u call      Contact: [phone]       Pt had 1st time taxol, carboplatin on Thursday. MD is [place]. Can you call and see how he is doing today? Thanks! Kristen ------

## 2013-11-24 ENCOUNTER — Other Ambulatory Visit: Payer: Self-pay | Admitting: Oncology

## 2013-11-25 ENCOUNTER — Ambulatory Visit
Admission: RE | Admit: 2013-11-25 | Discharge: 2013-11-25 | Disposition: A | Discharge status: Home / Self Care | Payer: Medicare Other | Source: Ambulatory Visit | Attending: Radiation Oncology | Admitting: Radiation Oncology

## 2013-11-25 ENCOUNTER — Ambulatory Visit (INDEPENDENT_AMBULATORY_CARE_PROVIDER_SITE_OTHER): Payer: Medicare Other | Admitting: *Deleted

## 2013-11-25 DIAGNOSIS — G459 Transient cerebral ischemic attack, unspecified: Secondary | ICD-10-CM

## 2013-11-25 DIAGNOSIS — I441 Atrioventricular block, second degree: Secondary | ICD-10-CM

## 2013-11-25 LAB — MDC_IDC_ENUM_SESS_TYPE_REMOTE

## 2013-11-26 ENCOUNTER — Ambulatory Visit
Admission: RE | Admit: 2013-11-26 | Discharge: 2013-11-26 | Disposition: A | Discharge status: Home / Self Care | Payer: Medicare Other | Source: Ambulatory Visit | Attending: Radiation Oncology | Admitting: Radiation Oncology

## 2013-11-26 VITALS — BP 126/80 | HR 92 | Temp 97.5°F | Ht 71.5 in | Wt 182.9 lb

## 2013-11-26 DIAGNOSIS — C160 Malignant neoplasm of cardia: Secondary | ICD-10-CM

## 2013-11-26 NOTE — Progress Notes (Signed)
Charles [name] has had 6 fractions to his [name].  He denies pain but reports he has a stomach ache every time he eats.  He has lost 3 lbs since 11/19/13.  He denies dizziness with standing.  He reports that he grazes and seems like he is eating more now.  He has met with the dietician.  He reports constipation and says the miralax is not helping him.  He denies nausea.  He reports that his throat was sore last week.  He is taking carafate which is helping.  He reports that food stays in his mid chest for hours.  He has been given radiaplex gel and instructed to use it after treatment and at bedtime.  He reports fatigue.

## 2013-11-26 NOTE — Progress Notes (Signed)
[facility]     Rexene Edison, M.D. [place], [place] [address]               Blair Promise, M.D., Ph.D. Phone: [phone]      [place] A. Tammi Klippel, M.D. Fax: [phone]      Jodelle Gross, M.D., Ph.D.         Thea Silversmith, M.D.         Wyvonnia Lora, M.D Weekly Treatment Management Note  Name: Charles [name]     MRN: [id number]        CSN: [id number] Date: 11/26/2013      DOB: 1934-10-30  CC: Kathlene November, MD         Larose Kells    Status: Outpatient  Diagnosis: The encounter diagnosis was Gastroesophageal cancer.  Current Dose: 10.8 Gy  Current Fraction: 6  Planned Dose: 50.4 Gy  Narrative: Charles [name] was seen today for weekly treatment management. The chart was checked and CBCT  were reviewed. He has had more problems with constipation since starting his treatment. He will be more proactive with this issue and double his MiraLax dose. Also complains of pain in the epigastric region for approximately an hour after eating. he feels lying down after eating helps with this issue.  Percocet; Atorvastatin; Ezetimibe; Lovastatin; and Ranexa Current Outpatient Prescriptions  Medication Sig Dispense Refill  . aspirin EC 81 MG tablet Take 1 tablet (81 mg total) by mouth daily.  90 tablet  3  . Cholecalciferol (VITAMIN D3) 2000 UNITS TABS Take 2,000 Units by mouth daily.      . Coenzyme Q10 (COQ10) 100 MG CAPS Take 100 mg by mouth daily.      Marland Kitchen dexamethasone (DECADRON) 2 MG tablet Take #5 (10 mg) at bedtime night before 1st chemo and #5 (10 mg) at 6 am day of 1st chemo  10 tablet  0  . diazepam (VALIUM) 2 MG tablet Take 1 tablet (2 mg total) by mouth at bedtime as needed.  30 tablet  4  . finasteride (PROSCAR) 5 MG tablet Take 5 mg by mouth every morning.       . isosorbide mononitrate (IMDUR) 60 MG 24 hr tablet Take 1 tablet (60 mg total) by mouth daily.  30 tablet  6  . lactulose, encephalopathy, (GENERLAC) 10 GM/15ML SOLN  Take 15 mLs (10 g total) by mouth as needed.  2700 mL  3  . losartan (COZAAR) 50 MG tablet Take 50 mg by mouth daily.      . metoprolol tartrate (LOPRESSOR) 25 MG tablet Take 1 tablet (25 mg total) by mouth 2 (two) times daily.  180 tablet  3  . Multiple Vitamins-Minerals (EYE VITAMINS) TABS Take 1 tablet by mouth daily.      . nitroGLYCERIN (NITROSTAT) 0.4 MG SL tablet Place 0.4 mg under the tongue every 5 (five) minutes as needed for chest pain.      . polyethylene glycol (MIRALAX / GLYCOLAX) packet Take 17 g by mouth daily.       . pravastatin (PRAVACHOL) 40 MG tablet TAKE 1 TABLET BY MOUTH EVERY EVENING  90 tablet  3  . sucralfate (CARAFATE) 1 GM/10ML suspension Take 10 mLs (1 g total) by mouth 4 (four) times daily -  with meals and at bedtime.  420 mL  0  . diphenhydramine-acetaminophen (TYLENOL PM) 25-500 MG TABS Take 1 tablet by mouth at bedtime as needed (  for sleep).      Marland Kitchen NEXIUM 40 MG capsule Take 40 mg by mouth daily.       . prochlorperazine (COMPAZINE) 10 MG tablet Take 1 tablet (10 mg total) by mouth every 6 (six) hours as needed for nausea.  60 tablet  1   No current facility-administered medications for this encounter.   Labs:  Lab Results  Component Value Date   WBC 7.3 10/17/2013   HGB 13.9 10/17/2013   HCT 41.3 10/17/2013   MCV 93.4 10/17/2013   PLT 207 10/17/2013   Lab Results  Component Value Date   CREATININE 1.5* 11/21/2013   BUN 32.7* 11/21/2013   NA 141 11/21/2013   K 3.9 11/21/2013   CL 107 10/17/2013   CO2 20* 11/21/2013   Lab Results  Component Value Date   ALT 17 11/21/2013   AST 19 11/21/2013   BILITOT 0.86 11/21/2013    Physical Examination:  Filed Vitals:   11/26/13 0931  BP: 126/80  Pulse: 92  Temp: 97.5 F (36.4 C)    Wt Readings from Last 3 Encounters:  11/26/13 182 lb 14.4 oz (82.963 kg)  11/19/13 189 lb (85.73 kg)  11/18/13 189 lb (85.73 kg)     Lungs - Normal respiratory effort, chest expands symmetrically. Lungs are clear to  auscultation, no crackles or wheezes.  Heart has regular rhythm and rate  Abdomen is soft and non tender with normal bowel sounds  Assessment:  Patient tolerating treatments well  Plan: Continue treatment per original radiation prescription

## 2013-11-27 ENCOUNTER — Encounter: Payer: Self-pay | Admitting: *Deleted

## 2013-11-27 ENCOUNTER — Ambulatory Visit
Admission: RE | Admit: 2013-11-27 | Discharge: 2013-11-27 | Disposition: A | Discharge status: Home / Self Care | Payer: Medicare Other | Source: Ambulatory Visit | Attending: Radiation Oncology | Admitting: Radiation Oncology

## 2013-11-27 MED ORDER — RADIAPLEXRX EX GEL
Freq: Once | CUTANEOUS | Status: AC
  Administered 2013-11-27: 16:00:00 via TOPICAL

## 2013-11-27 NOTE — Addendum Note (Signed)
Encounter addended by: Jacqulyn Liner, RN on: 11/27/2013  3:51 PM<BR>     Documentation filed: Inpatient MAR, Orders

## 2013-11-28 ENCOUNTER — Ambulatory Visit (HOSPITAL_BASED_OUTPATIENT_CLINIC_OR_DEPARTMENT_OTHER): Payer: Medicare Other

## 2013-11-28 ENCOUNTER — Ambulatory Visit: Payer: Medicare Other | Admitting: Nutrition

## 2013-11-28 ENCOUNTER — Ambulatory Visit (HOSPITAL_BASED_OUTPATIENT_CLINIC_OR_DEPARTMENT_OTHER): Payer: Medicare Other | Admitting: Oncology

## 2013-11-28 ENCOUNTER — Other Ambulatory Visit (HOSPITAL_BASED_OUTPATIENT_CLINIC_OR_DEPARTMENT_OTHER): Payer: Medicare Other

## 2013-11-28 ENCOUNTER — Telehealth: Payer: Self-pay | Admitting: Oncology

## 2013-11-28 ENCOUNTER — Telehealth: Payer: Self-pay | Admitting: *Deleted

## 2013-11-28 ENCOUNTER — Ambulatory Visit
Admission: RE | Admit: 2013-11-28 | Discharge: 2013-11-28 | Disposition: A | Discharge status: Home / Self Care | Payer: Medicare Other | Source: Ambulatory Visit | Attending: Radiation Oncology | Admitting: Radiation Oncology

## 2013-11-28 VITALS — BP 126/70 | HR 72 | Temp 97.5°F | Resp 18 | Ht 71.0 in | Wt 183.7 lb

## 2013-11-28 DIAGNOSIS — I10 Essential (primary) hypertension: Secondary | ICD-10-CM

## 2013-11-28 DIAGNOSIS — Z8673 Personal history of transient ischemic attack (TIA), and cerebral infarction without residual deficits: Secondary | ICD-10-CM

## 2013-11-28 DIAGNOSIS — Z5111 Encounter for antineoplastic chemotherapy: Secondary | ICD-10-CM

## 2013-11-28 DIAGNOSIS — R1310 Dysphagia, unspecified: Secondary | ICD-10-CM

## 2013-11-28 DIAGNOSIS — C160 Malignant neoplasm of cardia: Secondary | ICD-10-CM

## 2013-11-28 DIAGNOSIS — E119 Type 2 diabetes mellitus without complications: Secondary | ICD-10-CM

## 2013-11-28 DIAGNOSIS — R599 Enlarged lymph nodes, unspecified: Secondary | ICD-10-CM

## 2013-11-28 LAB — COMPREHENSIVE METABOLIC PANEL (CC13)
ALK PHOS: 63 U/L (ref 40–150)
ALT: 14 U/L (ref 0–55)
AST: 14 U/L (ref 5–34)
Albumin: 3.7 g/dL (ref 3.5–5.0)
Anion Gap: 8 mEq/L (ref 3–11)
BUN: 32.5 mg/dL — ABNORMAL HIGH (ref 7.0–26.0)
CHLORIDE: 111 meq/L — AB (ref 98–109)
CO2: 25 mEq/L (ref 22–29)
CREATININE: 1.3 mg/dL (ref 0.7–1.3)
Calcium: 9.8 mg/dL (ref 8.4–10.4)
Glucose: 131 mg/dl (ref 70–140)
Potassium: 4.6 mEq/L (ref 3.5–5.1)
Sodium: 144 mEq/L (ref 136–145)
Total Bilirubin: 0.76 mg/dL (ref 0.20–1.20)
Total Protein: 6.9 g/dL (ref 6.4–8.3)

## 2013-11-28 LAB — CBC WITH DIFFERENTIAL/PLATELET
BASO%: 0.4 % (ref 0.0–2.0)
Basophils Absolute: 0 10*3/uL (ref 0.0–0.1)
EOS%: 2.1 % (ref 0.0–7.0)
Eosinophils Absolute: 0.1 10*3/uL (ref 0.0–0.5)
HEMATOCRIT: 43 % (ref 38.4–49.9)
HGB: 14.1 g/dL (ref 13.0–17.1)
LYMPH%: 15 % (ref 14.0–49.0)
MCH: 30.5 pg (ref 27.2–33.4)
MCHC: 32.8 g/dL (ref 32.0–36.0)
MCV: 92.9 fL (ref 79.3–98.0)
MONO#: 0.2 10*3/uL (ref 0.1–0.9)
MONO%: 3.7 % (ref 0.0–14.0)
NEUT#: 4.8 10*3/uL (ref 1.5–6.5)
NEUT%: 78.8 % — AB (ref 39.0–75.0)
Platelets: 195 10*3/uL (ref 140–400)
RBC: 4.63 10*6/uL (ref 4.20–5.82)
RDW: 13.8 % (ref 11.0–14.6)
WBC: 6.1 10*3/uL (ref 4.0–10.3)
lymph#: 0.9 10*3/uL (ref 0.9–3.3)

## 2013-11-28 MED ORDER — DIPHENHYDRAMINE HCL 50 MG/ML IJ SOLN
INTRAMUSCULAR | Status: AC
  Filled 2013-11-28: qty 1

## 2013-11-28 MED ORDER — DEXAMETHASONE SODIUM PHOSPHATE 10 MG/ML IJ SOLN
INTRAMUSCULAR | Status: AC
  Filled 2013-11-28: qty 1

## 2013-11-28 MED ORDER — PACLITAXEL CHEMO INJECTION 300 MG/50ML
50.0000 mg/m2 | Freq: Once | INTRAVENOUS | Status: AC
  Administered 2013-11-28: 102 mg via INTRAVENOUS
  Filled 2013-11-28: qty 17

## 2013-11-28 MED ORDER — DEXAMETHASONE SODIUM PHOSPHATE 10 MG/ML IJ SOLN
10.0000 mg | Freq: Once | INTRAMUSCULAR | Status: AC
  Administered 2013-11-28: 10 mg via INTRAVENOUS

## 2013-11-28 MED ORDER — ONDANSETRON 16 MG/50ML IVPB (CHCC)
INTRAVENOUS | Status: AC
  Filled 2013-11-28: qty 16

## 2013-11-28 MED ORDER — SODIUM CHLORIDE 0.9 % IV SOLN
150.8000 mg | Freq: Once | INTRAVENOUS | Status: AC
  Administered 2013-11-28: 150 mg via INTRAVENOUS
  Filled 2013-11-28: qty 15

## 2013-11-28 MED ORDER — FAMOTIDINE IN NACL 20-0.9 MG/50ML-% IV SOLN
20.0000 mg | Freq: Once | INTRAVENOUS | Status: AC
  Administered 2013-11-28: 20 mg via INTRAVENOUS

## 2013-11-28 MED ORDER — DIPHENHYDRAMINE HCL 50 MG/ML IJ SOLN
25.0000 mg | Freq: Once | INTRAMUSCULAR | Status: AC
  Administered 2013-11-28: 25 mg via INTRAVENOUS

## 2013-11-28 MED ORDER — SODIUM CHLORIDE 0.9 % IV SOLN
Freq: Once | INTRAVENOUS | Status: AC
  Administered 2013-11-28: 11:00:00 via INTRAVENOUS

## 2013-11-28 MED ORDER — ONDANSETRON 16 MG/50ML IVPB (CHCC)
16.0000 mg | Freq: Once | INTRAVENOUS | Status: AC
  Administered 2013-11-28: 16 mg via INTRAVENOUS

## 2013-11-28 MED ORDER — FAMOTIDINE IN NACL 20-0.9 MG/50ML-% IV SOLN
INTRAVENOUS | Status: AC
  Filled 2013-11-28: qty 50

## 2013-11-28 NOTE — Progress Notes (Signed)
Called Dr. Benay Spice in reference to today's lab results.  Verbal order received and read back from Dr. Benay Spice to treat today.

## 2013-11-28 NOTE — Progress Notes (Signed)
Nutrition followup completed with patient in the infusion room.  Patient receiving chemotherapy for esophageal cancer.  Patient reports he is eating fine.  Noted decreased weight of 183.7 pounds from 187.4 pounds March 25.  Patient reminded staff that normal body weight is 180 pounds.  Patient feels like foods get stuck in his esophagus.  Patient complains of constipation.  Nutrition diagnosis: Food and nutrition related knowledge deficit continues.  Intervention: Patient was educated on strategies for improving constipation.  Again educated patient on strategies for improved glycemic control.  Provided strategies for patient to alter the taste of oral nutrition supplements to improve intake.  Provided fact sheets on constipation and additional samples.  Questions were answered.  Teach back method used.  Monitoring, evaluation, goals: Patient will tolerate adequate calories and protein for minimal weight loss.  Next visit: Thursday, May 30, during infusion.

## 2013-11-28 NOTE — Telephone Encounter (Signed)
Gave pt appt for lab,md,mL and chemo for April, emailed [facility] regarding chemo for May 2015

## 2013-11-28 NOTE — [facility] Oncology Navigator Note (Signed)
Met with patient to assess for needs during infusion.  He still has had trouble with eating and he states the supplements are too sweet.  He is scheduled to see dietician today.  He talked about reading up on statistics and survival rates re: his cancer. This RN discussed reading reliable information such as Glass blower/designer and [employer].  He stated he is doing well with his diagnosis and declined need to speak with counselor or SW.  He denies other barriers to care and has a very good support system in place.  Will continue to follow as needed.

## 2013-11-28 NOTE — Progress Notes (Signed)
  [facility] OFFICE PROGRESS NOTE   Diagnosis: Esophagus cancer  INTERVAL HISTORY:   He began weekly Taxol/carboplatin 11/21/2013. He denies nausea/vomiting and allergic reactions symptoms. He complains of solid dysphagia and odynophagia. The dysphagia has improved.  Objective:  Vital signs in last 24 hours:  There were no vitals taken for this visit.    HEENT: No thrush or ulcers Resp: Lungs clear bilaterally Cardio: Regular rate and rhythm GI: No hepatomegaly, nontender Vascular: No leg edema  Lab Results:  Lab Results  Component Value Date   WBC 6.1 11/28/2013   HGB 14.1 11/28/2013   HCT 43.0 11/28/2013   MCV 92.9 11/28/2013   PLT 195 11/28/2013   NEUTROABS 4.8 11/28/2013   Potassium 4.6, BUN 32.5, creatinine 1.3   Imaging:  No results found.  Medications: I have reviewed the patient's current medications.  Assessment/Plan: 1.clinical stage II versus stage III adenocarcinoma of the gastroesophageal junction, staging CT 10/18/2013 with a single enlarged gastrohepatic node  PET scan 11/08/2013 with hypermetabolism at the GE junction and a gastrohepatic lymph node Initiation of radiation 11/19/2013, first cycle of weekly Taxol/carboplatin 11/21/2013 2. History of coronary artery disease-status post coronary artery bypass surgery and placement of coronary stents  3. History of a TIA on 2 occasions  4. Remote history of histoplasmosis  5. Diabetes  6. Hypertension  7. Arrhythmia 8. solid dysphagia and odynophagia   Disposition:  He appears to be tolerating the treatment well. The plan is to proceed with week 2 of Taxol/carboplatin today. I suggested he use nutrition supplements and a mechanical soft diet. He declined pain medication. Mr. Baskette will return for an office visit in 2 weeks. He reports plans for a pacemaker have been placed on hold until he completes radiation.  Ladell Pier, MD  11/28/2013  9:43 AM

## 2013-11-28 NOTE — Progress Notes (Signed)
Discharged at 1410 ambulatory to lobby.  Called spouse to pick up.

## 2013-11-28 NOTE — Patient Instructions (Signed)
[facility] Discharge Instructions for Patients Receiving Chemotherapy  Today you received the following chemotherapy agents Taxol, carboplatin.  To help prevent nausea and vomiting after your treatment, we encourage you to take your nausea medication Compazine 10 mg as ordered by Dr. Benay Spice.   If you develop nausea and vomiting that is not controlled by your nausea medication, call the clinic.   BELOW ARE SYMPTOMS THAT SHOULD BE REPORTED IMMEDIATELY:  *FEVER GREATER THAN 100.5 F  *CHILLS WITH OR WITHOUT FEVER  NAUSEA AND VOMITING THAT IS NOT CONTROLLED WITH YOUR NAUSEA MEDICATION  *UNUSUAL SHORTNESS OF BREATH  *UNUSUAL BRUISING OR BLEEDING  TENDERNESS IN MOUTH AND THROAT WITH OR WITHOUT PRESENCE OF ULCERS  *URINARY PROBLEMS  *BOWEL PROBLEMS  UNUSUAL RASH Items with * indicate a potential emergency and should be followed up as soon as possible.  Feel free to call the clinic you have any questions or concerns. The clinic phone number is [phone].

## 2013-11-28 NOTE — Telephone Encounter (Signed)
I have adjusted appts 

## 2013-11-29 ENCOUNTER — Encounter: Payer: Self-pay | Admitting: *Deleted

## 2013-11-29 ENCOUNTER — Ambulatory Visit
Admission: RE | Admit: 2013-11-29 | Discharge: 2013-11-29 | Disposition: A | Discharge status: Home / Self Care | Payer: Medicare Other | Source: Ambulatory Visit | Attending: Radiation Oncology | Admitting: Radiation Oncology

## 2013-11-29 ENCOUNTER — Ambulatory Visit: Payer: Medicare Other | Admitting: Radiation Oncology

## 2013-11-29 NOTE — Progress Notes (Signed)
[facility] Social Work  Clinical Social Work was referred by patient  to review and complete healthcare advance directives.  Clinical Social Worker met with patient and spouse in [place] office.  The patient designated their spouse as their primary healthcare agent and daughter as their secondary agent.  Patient also completed healthcare living will.    Clinical Social Worker notarized documents and made copies for patient/family. Clinical Social Worker will send documents to medical records to be scanned into patient's chart. Clinical Social Worker encouraged patient/family to contact with any additional questions or concerns.  Polo Riley, MSW, [place] Worker [facility] [phone]

## 2013-12-01 ENCOUNTER — Other Ambulatory Visit: Payer: Self-pay | Admitting: Oncology

## 2013-12-02 ENCOUNTER — Encounter: Payer: Medicare Other | Admitting: Cardiovascular Disease

## 2013-12-02 ENCOUNTER — Ambulatory Visit
Admission: RE | Admit: 2013-12-02 | Discharge: 2013-12-02 | Disposition: A | Discharge status: Home / Self Care | Payer: Medicare Other | Source: Ambulatory Visit | Attending: Radiation Oncology | Admitting: Radiation Oncology

## 2013-12-03 ENCOUNTER — Ambulatory Visit
Admission: RE | Admit: 2013-12-03 | Discharge: 2013-12-03 | Disposition: A | Discharge status: Home / Self Care | Payer: Medicare Other | Source: Ambulatory Visit | Attending: Radiation Oncology | Admitting: Radiation Oncology

## 2013-12-03 VITALS — BP 128/73 | HR 80 | Temp 97.8°F | Ht 71.0 in | Wt 180.7 lb

## 2013-12-03 DIAGNOSIS — C160 Malignant neoplasm of cardia: Secondary | ICD-10-CM

## 2013-12-03 NOTE — Progress Notes (Signed)
Charles [name] has had 11 fraction to his GE junction.  He denies pain.  He reports having nausea over the weekend and not feeling like eating.  He reports he started having diarrhea on Saturday.  He has had about 3 loose stools a day since.  He has stopped taking lactulose and miralax.  He has lost 3 lbs since 11/28/13.  Orthostatic vitals done: bp sitting 128/73, hr 81, standing bp 92/69, hr 85.  He last had chemotherapy Thursday and will have it again this Thursday.  He denies trouble swallowing and avoids foods, like grapes, that cause him trouble.  He reports that he had a sore throat yesterday and took Carafate and it was relieved.  He reports that he has a rash on his neck that he noticed shaving.  It is a scattered, red rash.

## 2013-12-03 NOTE — Progress Notes (Signed)
  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 . . . . . . . The patient presents for routine under treatment assessment.                                   The patient is without complaint. He did feel lousy on Sunday with diarrhea and nausea. Yesterday he was quite busy and admits to limiting his by mouth intake. As a consequence the patient is noted to have orthostatic blood pressure changes today.  He denies any difficulties with keeping liquids down at this time.                                 Set-up films were reviewed.                                 The chart was checked. Physical Findings. . .  height is 5\' 11"  (1.803 m) and weight is 180 lb 11.2 oz (81.965 kg). His temperature is 97.8 F (36.6 C). His blood pressure is 128/73 and his pulse is 80. . Weight essentially stable.  No significant changes. Impression . . . . . . . The patient is tolerating radiation. Plan . . . . . . . . . . . . Continue treatment as planned. The patient will force fluids.  I did offer IV fluid supplementation but the patient feels he can address this issue today and tomorrow with forcing fluids  ________________________________   Blair Promise, PhD, MD

## 2013-12-04 ENCOUNTER — Telehealth: Payer: Self-pay | Admitting: Oncology

## 2013-12-04 ENCOUNTER — Ambulatory Visit
Admission: RE | Admit: 2013-12-04 | Discharge: 2013-12-04 | Disposition: A | Discharge status: Home / Self Care | Payer: Medicare Other | Source: Ambulatory Visit | Attending: Radiation Oncology | Admitting: Radiation Oncology

## 2013-12-04 NOTE — Telephone Encounter (Signed)
Talked to pt and he is aware of lab,md and chemo for April and MAy 2015

## 2013-12-05 ENCOUNTER — Encounter: Payer: Medicare Other | Admitting: Nutrition

## 2013-12-05 ENCOUNTER — Ambulatory Visit (HOSPITAL_BASED_OUTPATIENT_CLINIC_OR_DEPARTMENT_OTHER): Payer: Medicare Other

## 2013-12-05 ENCOUNTER — Other Ambulatory Visit (HOSPITAL_BASED_OUTPATIENT_CLINIC_OR_DEPARTMENT_OTHER): Payer: Medicare Other

## 2013-12-05 ENCOUNTER — Ambulatory Visit
Admission: RE | Admit: 2013-12-05 | Discharge: 2013-12-05 | Disposition: A | Discharge status: Home / Self Care | Payer: Medicare Other | Source: Ambulatory Visit | Attending: Radiation Oncology | Admitting: Radiation Oncology

## 2013-12-05 VITALS — BP 139/79 | HR 72 | Temp 97.6°F | Resp 16

## 2013-12-05 DIAGNOSIS — C160 Malignant neoplasm of cardia: Secondary | ICD-10-CM

## 2013-12-05 DIAGNOSIS — Z5111 Encounter for antineoplastic chemotherapy: Secondary | ICD-10-CM

## 2013-12-05 LAB — CBC WITH DIFFERENTIAL/PLATELET
BASO%: 0.8 % (ref 0.0–2.0)
BASOS ABS: 0 10*3/uL (ref 0.0–0.1)
EOS%: 2.3 % (ref 0.0–7.0)
Eosinophils Absolute: 0.1 10*3/uL (ref 0.0–0.5)
HEMATOCRIT: 45.1 % (ref 38.4–49.9)
HEMOGLOBIN: 15.2 g/dL (ref 13.0–17.1)
LYMPH%: 23.8 % (ref 14.0–49.0)
MCH: 30.7 pg (ref 27.2–33.4)
MCHC: 33.7 g/dL (ref 32.0–36.0)
MCV: 91.1 fL (ref 79.3–98.0)
MONO#: 0.4 10*3/uL (ref 0.1–0.9)
MONO%: 13.7 % (ref 0.0–14.0)
NEUT#: 1.5 10*3/uL (ref 1.5–6.5)
NEUT%: 59.4 % (ref 39.0–75.0)
PLATELETS: 165 10*3/uL (ref 140–400)
RBC: 4.95 10*6/uL (ref 4.20–5.82)
RDW: 13.3 % (ref 11.0–14.6)
WBC: 2.6 10*3/uL — ABNORMAL LOW (ref 4.0–10.3)
lymph#: 0.6 10*3/uL — ABNORMAL LOW (ref 0.9–3.3)
nRBC: 0 % (ref 0–0)

## 2013-12-05 MED ORDER — FAMOTIDINE IN NACL 20-0.9 MG/50ML-% IV SOLN
20.0000 mg | Freq: Once | INTRAVENOUS | Status: AC
  Administered 2013-12-05: 20 mg via INTRAVENOUS

## 2013-12-05 MED ORDER — DIPHENHYDRAMINE HCL 50 MG/ML IJ SOLN
25.0000 mg | Freq: Once | INTRAMUSCULAR | Status: AC
  Administered 2013-12-05: 25 mg via INTRAVENOUS

## 2013-12-05 MED ORDER — ONDANSETRON 16 MG/50ML IVPB (CHCC)
INTRAVENOUS | Status: AC
  Filled 2013-12-05: qty 16

## 2013-12-05 MED ORDER — ONDANSETRON 16 MG/50ML IVPB (CHCC)
16.0000 mg | Freq: Once | INTRAVENOUS | Status: AC
  Administered 2013-12-05: 16 mg via INTRAVENOUS

## 2013-12-05 MED ORDER — DEXAMETHASONE SODIUM PHOSPHATE 10 MG/ML IJ SOLN
10.0000 mg | Freq: Once | INTRAMUSCULAR | Status: AC
  Administered 2013-12-05: 10 mg via INTRAVENOUS

## 2013-12-05 MED ORDER — SODIUM CHLORIDE 0.9 % IV SOLN
150.8000 mg | Freq: Once | INTRAVENOUS | Status: AC
  Administered 2013-12-05: 150 mg via INTRAVENOUS
  Filled 2013-12-05: qty 15

## 2013-12-05 MED ORDER — FAMOTIDINE IN NACL 20-0.9 MG/50ML-% IV SOLN
INTRAVENOUS | Status: AC
  Filled 2013-12-05: qty 50

## 2013-12-05 MED ORDER — SODIUM CHLORIDE 0.9 % IV SOLN
Freq: Once | INTRAVENOUS | Status: AC
  Administered 2013-12-05: 11:00:00 via INTRAVENOUS

## 2013-12-05 MED ORDER — DEXAMETHASONE SODIUM PHOSPHATE 10 MG/ML IJ SOLN
INTRAMUSCULAR | Status: AC
  Filled 2013-12-05: qty 1

## 2013-12-05 MED ORDER — PACLITAXEL CHEMO INJECTION 300 MG/50ML
50.0000 mg/m2 | Freq: Once | INTRAVENOUS | Status: AC
  Administered 2013-12-05: 102 mg via INTRAVENOUS
  Filled 2013-12-05: qty 17

## 2013-12-05 MED ORDER — DIPHENHYDRAMINE HCL 50 MG/ML IJ SOLN
INTRAMUSCULAR | Status: AC
  Filled 2013-12-05: qty 1

## 2013-12-05 NOTE — Progress Notes (Signed)
Per Dr. Benay Spice, okay to use CMET from 11/28/13 for today's treatment. Cindi Carbon, RN

## 2013-12-05 NOTE — Patient Instructions (Signed)
[facility] Discharge Instructions for Patients Receiving Chemotherapy  Today you received the following chemotherapy agents: Taxol, Carboplatin  To help prevent nausea and vomiting after your treatment, we encourage you to take your nausea medication as prescribed.    If you develop nausea and vomiting that is not controlled by your nausea medication, call the clinic.   BELOW ARE SYMPTOMS THAT SHOULD BE REPORTED IMMEDIATELY:  *FEVER GREATER THAN 100.5 F  *CHILLS WITH OR WITHOUT FEVER  NAUSEA AND VOMITING THAT IS NOT CONTROLLED WITH YOUR NAUSEA MEDICATION  *UNUSUAL SHORTNESS OF BREATH  *UNUSUAL BRUISING OR BLEEDING  TENDERNESS IN MOUTH AND THROAT WITH OR WITHOUT PRESENCE OF ULCERS  *URINARY PROBLEMS  *BOWEL PROBLEMS  UNUSUAL RASH Items with * indicate a potential emergency and should be followed up as soon as possible.  Feel free to call the clinic you have any questions or concerns. The clinic phone number is [phone].    

## 2013-12-06 ENCOUNTER — Ambulatory Visit
Admission: RE | Admit: 2013-12-06 | Discharge: 2013-12-06 | Disposition: A | Discharge status: Home / Self Care | Payer: Medicare Other | Source: Ambulatory Visit | Attending: Radiation Oncology | Admitting: Radiation Oncology

## 2013-12-08 ENCOUNTER — Other Ambulatory Visit: Payer: Self-pay | Admitting: Oncology

## 2013-12-09 ENCOUNTER — Ambulatory Visit
Admission: RE | Admit: 2013-12-09 | Discharge: 2013-12-09 | Disposition: A | Discharge status: Home / Self Care | Payer: Medicare Other | Source: Ambulatory Visit | Attending: Radiation Oncology | Admitting: Radiation Oncology

## 2013-12-10 ENCOUNTER — Encounter: Payer: Self-pay | Admitting: Radiation Oncology

## 2013-12-10 ENCOUNTER — Ambulatory Visit
Admission: RE | Admit: 2013-12-10 | Discharge: 2013-12-10 | Disposition: A | Discharge status: Home / Self Care | Payer: Medicare Other | Source: Ambulatory Visit | Attending: Radiation Oncology | Admitting: Radiation Oncology

## 2013-12-10 VITALS — BP 138/75 | HR 72 | Resp 16 | Wt 179.5 lb

## 2013-12-10 DIAGNOSIS — C160 Malignant neoplasm of cardia: Secondary | ICD-10-CM

## 2013-12-10 MED ORDER — SUCRALFATE 1 GM/10ML PO SUSP: 1.0000 g | Freq: Three times a day (TID) | ORAL | Status: DC

## 2013-12-10 NOTE — Progress Notes (Signed)
  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 . . . . . . . The patient presents for routine under treatment assessment.                                   The patient is without complaint except for discomfort associated with eating. Lying down helps to alleviate this discomfort/pain.  Patient is able to eat most solid foods at this time.                                 Set-up films were reviewed.                                 The chart was checked. Physical Findings. . .  weight is 179 lb 8 oz (81.421 kg). His blood pressure is 138/75 and his pulse is 72. His respiration is 16 and oxygen saturation is 99%. . Weight essentially stable.  No significant changes. Impression . . . . . . . The patient is tolerating radiation. Plan . . . . . . . . . . . . Continue treatment as planned. Carafate suspension refilled today.  ________________________________   Blair Promise, PhD, MD

## 2013-12-10 NOTE — Progress Notes (Signed)
Patient denies pain but, does reports his stomach often aches. Reports mild nausea, denies emesis, and reports he has only taken his antiemetic once. Requesting refill of Carafate suspension. Patient questions if there is a cheaper alternative. Explained perhaps Dr. Sondra Come could write for the dissolving tablets so he could obtain the medication for less. Reports his diet varies and he grazes most the day. Reports he ate a piece of hamburger for dinner last night. Reports discomfort when he swallows. Reports food tends to "hang up when it goes down." Weight stable. Reports he has samples of chocolate Ensure but, saving those for later.

## 2013-12-11 ENCOUNTER — Ambulatory Visit
Admission: RE | Admit: 2013-12-11 | Discharge: 2013-12-11 | Disposition: A | Discharge status: Home / Self Care | Payer: Medicare Other | Source: Ambulatory Visit | Attending: Radiation Oncology | Admitting: Radiation Oncology

## 2013-12-12 ENCOUNTER — Telehealth: Payer: Self-pay | Admitting: Oncology

## 2013-12-12 ENCOUNTER — Ambulatory Visit (HOSPITAL_BASED_OUTPATIENT_CLINIC_OR_DEPARTMENT_OTHER): Payer: Medicare Other | Admitting: Nurse Practitioner

## 2013-12-12 ENCOUNTER — Ambulatory Visit: Payer: Medicare Other | Admitting: Nutrition

## 2013-12-12 ENCOUNTER — Ambulatory Visit (HOSPITAL_BASED_OUTPATIENT_CLINIC_OR_DEPARTMENT_OTHER): Payer: Medicare Other

## 2013-12-12 ENCOUNTER — Ambulatory Visit
Admission: RE | Admit: 2013-12-12 | Discharge: 2013-12-12 | Disposition: A | Discharge status: Home / Self Care | Payer: Medicare Other | Source: Ambulatory Visit | Attending: Radiation Oncology | Admitting: Radiation Oncology

## 2013-12-12 ENCOUNTER — Other Ambulatory Visit (HOSPITAL_BASED_OUTPATIENT_CLINIC_OR_DEPARTMENT_OTHER): Payer: Medicare Other

## 2013-12-12 VITALS — BP 161/75 | HR 76 | Temp 98.2°F | Resp 20 | Ht 71.0 in | Wt 180.6 lb

## 2013-12-12 DIAGNOSIS — C160 Malignant neoplasm of cardia: Secondary | ICD-10-CM

## 2013-12-12 DIAGNOSIS — Z5111 Encounter for antineoplastic chemotherapy: Secondary | ICD-10-CM

## 2013-12-12 DIAGNOSIS — I10 Essential (primary) hypertension: Secondary | ICD-10-CM

## 2013-12-12 DIAGNOSIS — E119 Type 2 diabetes mellitus without complications: Secondary | ICD-10-CM

## 2013-12-12 LAB — CBC WITH DIFFERENTIAL/PLATELET
BASO%: 0.9 % (ref 0.0–2.0)
Basophils Absolute: 0 10*3/uL (ref 0.0–0.1)
EOS%: 0.9 % (ref 0.0–7.0)
Eosinophils Absolute: 0 10*3/uL (ref 0.0–0.5)
HCT: 43.3 % (ref 38.4–49.9)
HGB: 14.3 g/dL (ref 13.0–17.1)
LYMPH%: 9.4 % — ABNORMAL LOW (ref 14.0–49.0)
MCH: 30.4 pg (ref 27.2–33.4)
MCHC: 33 g/dL (ref 32.0–36.0)
MCV: 91.9 fL (ref 79.3–98.0)
MONO#: 0.2 10*3/uL (ref 0.1–0.9)
MONO%: 7.3 % (ref 0.0–14.0)
NEUT#: 2.7 10*3/uL (ref 1.5–6.5)
NEUT%: 81.5 % — ABNORMAL HIGH (ref 39.0–75.0)
Platelets: 151 10*3/uL (ref 140–400)
RBC: 4.71 10*6/uL (ref 4.20–5.82)
RDW: 13.9 % (ref 11.0–14.6)
WBC: 3.3 10*3/uL — ABNORMAL LOW (ref 4.0–10.3)
lymph#: 0.3 10*3/uL — ABNORMAL LOW (ref 0.9–3.3)

## 2013-12-12 LAB — COMPREHENSIVE METABOLIC PANEL (CC13)
ALT: 13 U/L (ref 0–55)
AST: 16 U/L (ref 5–34)
Albumin: 3.7 g/dL (ref 3.5–5.0)
Alkaline Phosphatase: 66 U/L (ref 40–150)
Anion Gap: 8 mEq/L (ref 3–11)
BUN: 25.6 mg/dL (ref 7.0–26.0)
CO2: 25 mEq/L (ref 22–29)
Calcium: 9.8 mg/dL (ref 8.4–10.4)
Chloride: 109 mEq/L (ref 98–109)
Creatinine: 1.1 mg/dL (ref 0.7–1.3)
Glucose: 136 mg/dl (ref 70–140)
Potassium: 4.3 mEq/L (ref 3.5–5.1)
Sodium: 143 mEq/L (ref 136–145)
Total Bilirubin: 0.82 mg/dL (ref 0.20–1.20)
Total Protein: 7 g/dL (ref 6.4–8.3)

## 2013-12-12 MED ORDER — ONDANSETRON 16 MG/50ML IVPB (CHCC)
16.0000 mg | Freq: Once | INTRAVENOUS | Status: AC
  Administered 2013-12-12: 16 mg via INTRAVENOUS

## 2013-12-12 MED ORDER — DEXAMETHASONE SODIUM PHOSPHATE 10 MG/ML IJ SOLN
10.0000 mg | Freq: Once | INTRAMUSCULAR | Status: AC
  Administered 2013-12-12: 10 mg via INTRAVENOUS

## 2013-12-12 MED ORDER — DIPHENHYDRAMINE HCL 50 MG/ML IJ SOLN
25.0000 mg | Freq: Once | INTRAMUSCULAR | Status: AC
  Administered 2013-12-12: 25 mg via INTRAVENOUS

## 2013-12-12 MED ORDER — DEXAMETHASONE SODIUM PHOSPHATE 10 MG/ML IJ SOLN
INTRAMUSCULAR | Status: AC
  Filled 2013-12-12: qty 1

## 2013-12-12 MED ORDER — PACLITAXEL CHEMO INJECTION 300 MG/50ML
50.0000 mg/m2 | Freq: Once | INTRAVENOUS | Status: AC
  Administered 2013-12-12: 102 mg via INTRAVENOUS
  Filled 2013-12-12: qty 17

## 2013-12-12 MED ORDER — FAMOTIDINE IN NACL 20-0.9 MG/50ML-% IV SOLN
INTRAVENOUS | Status: AC
  Filled 2013-12-12: qty 50

## 2013-12-12 MED ORDER — SODIUM CHLORIDE 0.9 % IV SOLN
Freq: Once | INTRAVENOUS | Status: AC
  Administered 2013-12-12: 12:00:00 via INTRAVENOUS

## 2013-12-12 MED ORDER — CARBOPLATIN CHEMO INJECTION 450 MG/45ML
180.0000 mg | Freq: Once | INTRAVENOUS | Status: AC
  Administered 2013-12-12: 180 mg via INTRAVENOUS
  Filled 2013-12-12: qty 18

## 2013-12-12 MED ORDER — FAMOTIDINE IN NACL 20-0.9 MG/50ML-% IV SOLN
20.0000 mg | Freq: Once | INTRAVENOUS | Status: AC
  Administered 2013-12-12: 20 mg via INTRAVENOUS

## 2013-12-12 MED ORDER — TRAMADOL HCL 50 MG PO TABS: 50.0000 mg | ORAL_TABLET | Freq: Four times a day (QID) | ORAL | Status: DC | PRN

## 2013-12-12 MED ORDER — DIPHENHYDRAMINE HCL 50 MG/ML IJ SOLN
INTRAMUSCULAR | Status: AC
  Filled 2013-12-12: qty 1

## 2013-12-12 MED ORDER — ONDANSETRON 16 MG/50ML IVPB (CHCC)
INTRAVENOUS | Status: AC
  Filled 2013-12-12: qty 16

## 2013-12-12 NOTE — Progress Notes (Signed)
Nutrition followup completed with patient in the infusion room.  Patient receiving chemotherapy for esophageal cancer.  He continues to eat well.  Weight documented as 180.6 pounds May 7.  Patient within normal body weight.  Patient has mild nausea but complains mostly of stomach pain with eating or drinking.  Patient prescribed pain medications today.  Nutrition diagnosis: Food and nutrition related knowledge deficit improved.  Intervention: Patient encouraged to continue small frequent meals modifying textures as needed.  Patient will strive for weight maintenance.  Patient to add oral nutrition supplements if weight loss occurs.  Questions were answered.  Teach back method used.  Monitoring, evaluation, goals: Patient will tolerate adequate calories and protein for weight stabilization.  Next visit: Patient has my contact information for questions or concerns.

## 2013-12-12 NOTE — Telephone Encounter (Signed)
gv adn printed appts sched and avs for pt for May

## 2013-12-12 NOTE — Progress Notes (Addendum)
  [facility] OFFICE PROGRESS NOTE   Diagnosis:  Adenocarcinoma GE junction.  INTERVAL HISTORY:   Mr. Konecny returns as scheduled. He completed week 3 Taxol/carboplatin on 12/05/2013. He denies nausea/vomiting. No mouth sores. No diarrhea. No numbness or tingling in his hands or feet. He notes mild pain with swallowing. About 30 minutes after eating he develops lower abdominal pain lasting one to one half hours. Bowels moving with the aid of a laxative.  Objective:  Vital signs in last 24 hours:  Blood pressure 161/75, pulse 76, temperature 98.2 F (36.8 C), temperature source Oral, resp. rate 20, height 5\' 11"  (1.803 m), weight 180 lb 9.6 oz (81.92 kg), SpO2 100.00%.    HEENT: No thrush or ulcerations. Resp: Lungs clear. Cardio: Regular cardiac rhythm. GI: Abdomen soft and nontender. No organomegaly. Vascular: No leg edema. Neuro: Vibratory sense mildly decreased over the fingertips per tuning fork exam.    Lab Results:  Lab Results  Component Value Date   WBC 3.3* 12/12/2013   HGB 14.3 12/12/2013   HCT 43.3 12/12/2013   MCV 91.9 12/12/2013   PLT 151 12/12/2013   NEUTROABS 2.7 12/12/2013    Imaging:  No results found.  Medications: I have reviewed the patient's current medications.  Assessment/Plan: 1. 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 2. History of coronary artery disease-status post coronary artery bypass surgery and placement of coronary stents  3. History of a TIA on 2 occasions  4. Remote history of histoplasmosis  5. Diabetes  6. Hypertension  7. Arrhythmia  8. Solid dysphagia and odynophagia.    Disposition: He appears stable. He has completed 3 weekly  treatments with Taxol/carboplatin. He continues radiation.   Plan to proceed with week 4 Taxol/carboplatin today as scheduled. He will return for the fifth and final weekly chemotherapy on 12/19/2013. He completes the course of radiation on 12/26/2013.  He is experiencing mild odynophagia likely secondary to the radiation. He notes improvement with Carafate. After eating he develops lower abdominal pain. Question referred pain. We gave him a prescription for tramadol 50 mg every 6 hours as needed. He will contact the office if this is not effective.  He will return for a followup visit on 01/02/2014. He will contact the office in the interim as outlined above or with any other problems.  Patient seen with Dr. Benay Spice.      Owens Shark ANP/GNP-BC   12/12/2013  11:24 AM  This was shared visit with Ned Card. Mr. Stoneking was interviewed and examined. He appears to be tolerating the chemotherapy well. I suspect the postprandial pain is related to esophagogastritis. He will try tramadol.  Julieanne Manson, M.D.

## 2013-12-12 NOTE — Patient Instructions (Signed)
[facility] Discharge Instructions for Patients Receiving Chemotherapy  Today you received the following chemotherapy agents taxol and carboplatin.  To help prevent nausea and vomiting after your treatment, we encourage you to take your nausea medication compazine.   If you develop nausea and vomiting that is not controlled by your nausea medication, call the clinic.   BELOW ARE SYMPTOMS THAT SHOULD BE REPORTED IMMEDIATELY:  *FEVER GREATER THAN 100.5 F  *CHILLS WITH OR WITHOUT FEVER  NAUSEA AND VOMITING THAT IS NOT CONTROLLED WITH YOUR NAUSEA MEDICATION  *UNUSUAL SHORTNESS OF BREATH  *UNUSUAL BRUISING OR BLEEDING  TENDERNESS IN MOUTH AND THROAT WITH OR WITHOUT PRESENCE OF ULCERS  *URINARY PROBLEMS  *BOWEL PROBLEMS  UNUSUAL RASH Items with * indicate a potential emergency and should be followed up as soon as possible.  Feel free to call the clinic you have any questions or concerns. The clinic phone number is [phone].    

## 2013-12-13 ENCOUNTER — Ambulatory Visit
Admission: RE | Admit: 2013-12-13 | Discharge: 2013-12-13 | Disposition: A | Discharge status: Home / Self Care | Payer: Medicare Other | Source: Ambulatory Visit | Attending: Radiation Oncology | Admitting: Radiation Oncology

## 2013-12-15 ENCOUNTER — Other Ambulatory Visit: Payer: Self-pay | Admitting: Oncology

## 2013-12-16 ENCOUNTER — Ambulatory Visit
Admission: RE | Admit: 2013-12-16 | Discharge: 2013-12-16 | Disposition: A | Discharge status: Home / Self Care | Payer: Medicare Other | Source: Ambulatory Visit | Attending: Radiation Oncology | Admitting: Radiation Oncology

## 2013-12-17 ENCOUNTER — Ambulatory Visit
Admission: RE | Admit: 2013-12-17 | Discharge: 2013-12-17 | Disposition: A | Discharge status: Home / Self Care | Payer: Medicare Other | Source: Ambulatory Visit | Attending: Radiation Oncology | Admitting: Radiation Oncology

## 2013-12-17 VITALS — BP 107/73 | HR 120 | Temp 97.5°F | Ht 71.0 in | Wt 176.7 lb

## 2013-12-17 DIAGNOSIS — C160 Malignant neoplasm of cardia: Secondary | ICD-10-CM

## 2013-12-17 MED ORDER — HYDROCODONE-ACETAMINOPHEN 7.5-325 MG/15ML PO SOLN: 10.0000 mL | Freq: Four times a day (QID) | ORAL | Status: DC | PRN

## 2013-12-17 NOTE — Progress Notes (Signed)
Charles [name] has had 21 fractions to his [name].  He reports pain after eating in his mid chest that travels down to his abdomen. He is taking Carafate.  He is eating only liquids now.  He is wondering what he can take for pain.  He is allergic to percocet and tramadol made him throw up.  He is drinking Glucerna shakes.  He had chemotherapy last Thursday.  He reports nose bleeds and is wondering if they are from chemo or radiation.  He has lost 4 lbs since last week.  Orthostatic vitals done: bp sitting 104/69, hr 111, standing bp 107/73, hr 120.  He reports fatigue and has been having chest pain with activity. He has had to take nitroglycerin once.

## 2013-12-17 NOTE — Progress Notes (Signed)
  Radiation Oncology         [phone] ________________________________  Name: Charles [name] MRN: [id number]  Date: 12/17/2013  DOB: Jun 27, 1935  Weekly Radiation Therapy Management  Gastroesophageal cancer   Primary site: Esophagus - Adenocarcinoma   Staging method: AJCC 7th Edition   Clinical: (TX, N1, M0)   Summary: (TX, N1, M0)  Current Dose: 37.8 Gy     Planned Dose:  50.4 Gy  Narrative . . . . . . . . The patient presents for routine under treatment assessment.                                   The patient has switched to liquids only at this time. Solid foods give the sensation of sticking in the lower chest region.  Patient is having some pain and has tried Tylenol which he did not tolerate. He does not getting significant relief with Tylenol. Discuss consideration for hydroco done or a Duragesic patch.  patient would like to try hydrocodone,  liquid form and a prescription for hycet has been given                                 Set-up films were reviewed.                                 The chart was checked. Physical Findings. . .  height is 5\' 11"  (1.803 m) and weight is 176 lb 11.2 oz (80.151 kg). His temperature is 97.5 F (36.4 C). His blood pressure is 107/73 and his pulse is 120. His oxygen saturation is 97%. . The lungs are clear. The heart has a regular rhythm and rate. The abdomen is soft and nontender. Bowel sounds are slightly decreased.  No significant changes. Impression . . . . . . . The patient is tolerating radiation. Plan . . . . . . . . . . . . Continue treatment as planned.  ________________________________   Blair Promise, PhD, MD

## 2013-12-18 ENCOUNTER — Other Ambulatory Visit: Payer: Self-pay | Admitting: *Deleted

## 2013-12-18 ENCOUNTER — Ambulatory Visit
Admission: RE | Admit: 2013-12-18 | Discharge: 2013-12-18 | Disposition: A | Discharge status: Home / Self Care | Payer: Medicare Other | Source: Ambulatory Visit | Attending: Radiation Oncology | Admitting: Radiation Oncology

## 2013-12-18 DIAGNOSIS — C160 Malignant neoplasm of cardia: Secondary | ICD-10-CM

## 2013-12-19 ENCOUNTER — Other Ambulatory Visit (HOSPITAL_BASED_OUTPATIENT_CLINIC_OR_DEPARTMENT_OTHER): Payer: Medicare Other

## 2013-12-19 ENCOUNTER — Other Ambulatory Visit: Payer: Self-pay | Admitting: Radiation Oncology

## 2013-12-19 ENCOUNTER — Ambulatory Visit
Admission: RE | Admit: 2013-12-19 | Discharge: 2013-12-19 | Disposition: A | Discharge status: Home / Self Care | Payer: Medicare Other | Source: Ambulatory Visit | Attending: Radiation Oncology | Admitting: Radiation Oncology

## 2013-12-19 ENCOUNTER — Ambulatory Visit (HOSPITAL_BASED_OUTPATIENT_CLINIC_OR_DEPARTMENT_OTHER): Payer: Medicare Other

## 2013-12-19 VITALS — BP 135/79 | HR 91 | Temp 98.1°F | Resp 20

## 2013-12-19 DIAGNOSIS — C160 Malignant neoplasm of cardia: Secondary | ICD-10-CM

## 2013-12-19 DIAGNOSIS — Z5111 Encounter for antineoplastic chemotherapy: Secondary | ICD-10-CM

## 2013-12-19 LAB — CBC WITH DIFFERENTIAL/PLATELET
BASO%: 1.5 % (ref 0.0–2.0)
BASOS ABS: 0.1 10*3/uL (ref 0.0–0.1)
EOS%: 1.2 % (ref 0.0–7.0)
Eosinophils Absolute: 0 10*3/uL (ref 0.0–0.5)
HEMATOCRIT: 43.7 % (ref 38.4–49.9)
HEMOGLOBIN: 14.6 g/dL (ref 13.0–17.1)
LYMPH#: 0.4 10*3/uL — AB (ref 0.9–3.3)
LYMPH%: 12.5 % — ABNORMAL LOW (ref 14.0–49.0)
MCH: 30.5 pg (ref 27.2–33.4)
MCHC: 33.4 g/dL (ref 32.0–36.0)
MCV: 91.4 fL (ref 79.3–98.0)
MONO#: 0.4 10*3/uL (ref 0.1–0.9)
MONO%: 10.6 % (ref 0.0–14.0)
NEUT#: 2.4 10*3/uL (ref 1.5–6.5)
NEUT%: 74.2 % (ref 39.0–75.0)
Platelets: 146 10*3/uL (ref 140–400)
RBC: 4.78 10*6/uL (ref 4.20–5.82)
RDW: 13.7 % (ref 11.0–14.6)
WBC: 3.3 10*3/uL — AB (ref 4.0–10.3)
nRBC: 0 % (ref 0–0)

## 2013-12-19 MED ORDER — PACLITAXEL CHEMO INJECTION 300 MG/50ML
50.0000 mg/m2 | Freq: Once | INTRAVENOUS | Status: AC
  Administered 2013-12-19: 102 mg via INTRAVENOUS
  Filled 2013-12-19: qty 17

## 2013-12-19 MED ORDER — DEXAMETHASONE SODIUM PHOSPHATE 10 MG/ML IJ SOLN
INTRAMUSCULAR | Status: AC
  Filled 2013-12-19: qty 1

## 2013-12-19 MED ORDER — HYDROCODONE-ACETAMINOPHEN 7.5-325 MG/15ML PO SOLN: 10.0000 mL | Freq: Four times a day (QID) | ORAL | Status: DC | PRN

## 2013-12-19 MED ORDER — FAMOTIDINE IN NACL 20-0.9 MG/50ML-% IV SOLN
INTRAVENOUS | Status: AC
  Filled 2013-12-19: qty 50

## 2013-12-19 MED ORDER — DIPHENHYDRAMINE HCL 50 MG/ML IJ SOLN
INTRAMUSCULAR | Status: AC
  Filled 2013-12-19: qty 1

## 2013-12-19 MED ORDER — ONDANSETRON 16 MG/50ML IVPB (CHCC)
INTRAVENOUS | Status: AC
  Filled 2013-12-19: qty 16

## 2013-12-19 MED ORDER — ONDANSETRON 16 MG/50ML IVPB (CHCC)
16.0000 mg | Freq: Once | INTRAVENOUS | Status: AC
  Administered 2013-12-19: 16 mg via INTRAVENOUS

## 2013-12-19 MED ORDER — FAMOTIDINE IN NACL 20-0.9 MG/50ML-% IV SOLN
20.0000 mg | Freq: Once | INTRAVENOUS | Status: AC
  Administered 2013-12-19: 20 mg via INTRAVENOUS

## 2013-12-19 MED ORDER — SODIUM CHLORIDE 0.9 % IV SOLN
180.0000 mg | Freq: Once | INTRAVENOUS | Status: AC
  Administered 2013-12-19: 180 mg via INTRAVENOUS
  Filled 2013-12-19: qty 18

## 2013-12-19 MED ORDER — DEXAMETHASONE SODIUM PHOSPHATE 10 MG/ML IJ SOLN
10.0000 mg | Freq: Once | INTRAMUSCULAR | Status: AC
  Administered 2013-12-19: 10 mg via INTRAVENOUS

## 2013-12-19 MED ORDER — DIPHENHYDRAMINE HCL 50 MG/ML IJ SOLN
25.0000 mg | Freq: Once | INTRAMUSCULAR | Status: AC
  Administered 2013-12-19: 25 mg via INTRAVENOUS

## 2013-12-19 MED ORDER — HYDROCODONE-ACETAMINOPHEN 7.5-325 MG/15ML PO SOLN
ORAL | Status: AC
  Filled 2013-12-19: qty 15

## 2013-12-19 MED ORDER — SODIUM CHLORIDE 0.9 % IV SOLN
Freq: Once | INTRAVENOUS | Status: AC
  Administered 2013-12-19: 10:00:00 via INTRAVENOUS

## 2013-12-19 MED ORDER — HYDROCODONE-ACETAMINOPHEN 7.5-325 MG/15ML PO SOLN
10.0000 mL | Freq: Once | ORAL | Status: AC
Start: 2013-12-19 — End: 2013-12-19
  Administered 2013-12-19: 10 mL via ORAL

## 2013-12-19 NOTE — Patient Instructions (Signed)
[facility] Discharge Instructions for Patients Receiving Chemotherapy  Today you received the following chemotherapy agents:  Taxol and Carboplatin  To help prevent nausea and vomiting after your treatment, we encourage you to take your nausea medication as ordered per MD.   If you develop nausea and vomiting that is not controlled by your nausea medication, call the clinic.   BELOW ARE SYMPTOMS THAT SHOULD BE REPORTED IMMEDIATELY:  *FEVER GREATER THAN 100.5 F  *CHILLS WITH OR WITHOUT FEVER  NAUSEA AND VOMITING THAT IS NOT CONTROLLED WITH YOUR NAUSEA MEDICATION  *UNUSUAL SHORTNESS OF BREATH  *UNUSUAL BRUISING OR BLEEDING  TENDERNESS IN MOUTH AND THROAT WITH OR WITHOUT PRESENCE OF ULCERS  *URINARY PROBLEMS  *BOWEL PROBLEMS  UNUSUAL RASH Items with * indicate a potential emergency and should be followed up as soon as possible.  Feel free to call the clinic you have any questions or concerns. The clinic phone number is [phone].    

## 2013-12-20 ENCOUNTER — Ambulatory Visit
Admission: RE | Admit: 2013-12-20 | Discharge: 2013-12-20 | Disposition: A | Discharge status: Home / Self Care | Payer: Medicare Other | Source: Ambulatory Visit | Attending: Radiation Oncology | Admitting: Radiation Oncology

## 2013-12-23 ENCOUNTER — Ambulatory Visit
Admission: RE | Admit: 2013-12-23 | Discharge: 2013-12-23 | Disposition: A | Discharge status: Home / Self Care | Payer: Medicare Other | Source: Ambulatory Visit | Attending: Radiation Oncology | Admitting: Radiation Oncology

## 2013-12-24 ENCOUNTER — Ambulatory Visit
Admission: RE | Admit: 2013-12-24 | Discharge: 2013-12-24 | Disposition: A | Discharge status: Home / Self Care | Payer: Medicare Other | Source: Ambulatory Visit | Attending: Radiation Oncology | Admitting: Radiation Oncology

## 2013-12-24 ENCOUNTER — Telehealth: Payer: Self-pay | Admitting: *Deleted

## 2013-12-24 ENCOUNTER — Ambulatory Visit (HOSPITAL_BASED_OUTPATIENT_CLINIC_OR_DEPARTMENT_OTHER): Payer: Medicare Other

## 2013-12-24 VITALS — BP 84/53 | HR 85 | Temp 97.4°F | Ht 71.0 in | Wt 172.1 lb

## 2013-12-24 VITALS — BP 110/62 | HR 92 | Temp 97.0°F | Resp 19

## 2013-12-24 DIAGNOSIS — Z5189 Encounter for other specified aftercare: Secondary | ICD-10-CM

## 2013-12-24 DIAGNOSIS — C160 Malignant neoplasm of cardia: Secondary | ICD-10-CM

## 2013-12-24 LAB — CBC WITH DIFFERENTIAL/PLATELET
BASO%: 1.2 % (ref 0.0–2.0)
Basophils Absolute: 0 10*3/uL (ref 0.0–0.1)
EOS ABS: 0 10*3/uL (ref 0.0–0.5)
EOS%: 0.8 % (ref 0.0–7.0)
HCT: 40.6 % (ref 38.4–49.9)
HGB: 13.5 g/dL (ref 13.0–17.1)
LYMPH%: 9.8 % — AB (ref 14.0–49.0)
MCH: 30.1 pg (ref 27.2–33.4)
MCHC: 33.3 g/dL (ref 32.0–36.0)
MCV: 90.4 fL (ref 79.3–98.0)
MONO#: 0.2 10*3/uL (ref 0.1–0.9)
MONO%: 7.9 % (ref 0.0–14.0)
NEUT%: 80.3 % — ABNORMAL HIGH (ref 39.0–75.0)
NEUTROS ABS: 2 10*3/uL (ref 1.5–6.5)
PLATELETS: 129 10*3/uL — AB (ref 140–400)
RBC: 4.49 10*6/uL (ref 4.20–5.82)
RDW: 13.7 % (ref 11.0–14.6)
WBC: 2.5 10*3/uL — AB (ref 4.0–10.3)
lymph#: 0.3 10*3/uL — ABNORMAL LOW (ref 0.9–3.3)

## 2013-12-24 LAB — BASIC METABOLIC PANEL (CC13)
Anion Gap: 11 mEq/L (ref 3–11)
BUN: 30.3 mg/dL — ABNORMAL HIGH (ref 7.0–26.0)
CO2: 24 meq/L (ref 22–29)
CREATININE: 1.3 mg/dL (ref 0.7–1.3)
Calcium: 9.7 mg/dL (ref 8.4–10.4)
Chloride: 109 mEq/L (ref 98–109)
GLUCOSE: 142 mg/dL — AB (ref 70–140)
Potassium: 4.4 mEq/L (ref 3.5–5.1)
Sodium: 143 mEq/L (ref 136–145)

## 2013-12-24 MED ORDER — SODIUM CHLORIDE 0.9 % IV SOLN
1000.0000 mL | Freq: Once | INTRAVENOUS | Status: AC
  Administered 2013-12-24: 1000 mL via INTRAVENOUS

## 2013-12-24 NOTE — Telephone Encounter (Signed)
Spoke with Tanya,Charge Rn, per Dr.kinard request, can patient get IVF"S today ? Patient is also Dr.Sherrill's patient, per Lavella Lemons, 1130 am 1 hour slot available, asked patient and he agreed that time is okay with him, thanked Tanya,RN and informed her Dr,.Kinard would place order for IVF"s,  9:37 AM

## 2013-12-24 NOTE — Patient Instructions (Signed)
Dehydration, Adult Dehydration is when you lose more fluids from the body than you take in. Vital organs like the kidneys, brain, and heart cannot function without a proper amount of fluids and salt. Any loss of fluids from the body can cause dehydration.  CAUSES   Vomiting.  Diarrhea.  Excessive sweating.  Excessive urine output.  Fever. SYMPTOMS  Mild dehydration  Thirst.  Dry lips.  Slightly dry mouth. Moderate dehydration  Very dry mouth.  Sunken eyes.  Skin does not bounce back quickly when lightly pinched and released.  Dark urine and decreased urine production.  Decreased tear production.  Headache. Severe dehydration  Very dry mouth.  Extreme thirst.  Rapid, weak pulse (more than 100 beats per minute at rest).  Cold hands and feet.  Not able to sweat in spite of heat and temperature.  Rapid breathing.  Blue lips.  Confusion and lethargy.  Difficulty being awakened.  Minimal urine production.  No tears. DIAGNOSIS  Your caregiver will diagnose dehydration based on your symptoms and your exam. Blood and urine tests will help confirm the diagnosis. The diagnostic evaluation should also identify the cause of dehydration. TREATMENT  Treatment of mild or moderate dehydration can often be done at home by increasing the amount of fluids that you drink. It is best to drink small amounts of fluid more often. Drinking too much at one time can make vomiting worse. Refer to the home care instructions below. Severe dehydration needs to be treated at the hospital where you will probably be given intravenous (IV) fluids that contain water and electrolytes. HOME CARE INSTRUCTIONS   Ask your caregiver about specific rehydration instructions.  Drink enough fluids to keep your urine clear or pale yellow.  Drink small amounts frequently if you have nausea and vomiting.  Eat as you normally do.  Avoid:  Foods or drinks high in sugar.  Carbonated  drinks.  Juice.  Extremely hot or cold fluids.  Drinks with caffeine.  Fatty, greasy foods.  Alcohol.  Tobacco.  Overeating.  Gelatin desserts.  Wash your hands well to avoid spreading bacteria and viruses.  Only take over-the-counter or prescription medicines for pain, discomfort, or fever as directed by your caregiver.  Ask your caregiver if you should continue all prescribed and over-the-counter medicines.  Keep all follow-up appointments with your caregiver. SEEK MEDICAL CARE IF:  You have abdominal pain and it increases or stays in one area (localizes).  You have a rash, stiff neck, or severe headache.  You are irritable, sleepy, or difficult to awaken.  You are weak, dizzy, or extremely thirsty. SEEK IMMEDIATE MEDICAL CARE IF:   You are unable to keep fluids down or you get worse despite treatment.  You have frequent episodes of vomiting or diarrhea.  You have blood or green matter (bile) in your vomit.  You have blood in your stool or your stool looks black and tarry.  You have not urinated in 6 to 8 hours, or you have only urinated a small amount of very dark urine.  You have a fever.  You faint. MAKE SURE YOU:   Understand these instructions.  Will watch your condition.  Will get help right away if you are not doing well or get worse. Document Released: 07/25/2005 Document Revised: 10/17/2011 Document Reviewed: 03/14/2011 [employer] Patient Information 2014 [employer].  

## 2013-12-24 NOTE — Progress Notes (Signed)
Rj Pedrosa has had 26 fractions to his [name].  He continues to have burning pain with eating, especially when drinking water.  He says that it hits his stomach and then the burning radiates to both side of his abdomen.  He reports that the hycet worked at first but is not helping now.  Advised him to take the hycet a half an hour before eating to see if it helps.  He is also taking carafate before meals.  He has been trying to take sips of water.  He has lost 4 lbs since 5/12. Orthostatic vitals done: bp sitting 130/70, hr 79, bp standing 84/53, hr 85.  He is taking his bp medicines: losartan, lopressor and imdur.  He reports feeling dizzy when standing.  He also reports having diarrhea this morning.  Advised him to take Imodium as needed.  He reports fatigue.  He denies skin changes in the treatment area.

## 2013-12-24 NOTE — Addendum Note (Signed)
Encounter addended by: Jacqulyn Liner, RN on: 12/24/2013  9:53 AM<BR>     Documentation filed: Vitals Section

## 2013-12-24 NOTE — Progress Notes (Signed)
  Radiation Oncology         [phone] ________________________________  Name: Charles [name] MRN: [id number]  Date: 12/24/2013  DOB: [birth year]  Weekly Radiation Therapy Management Gastroesophageal cancer   Primary site: Esophagus - Adenocarcinoma   Staging method: AJCC 7th Edition   Clinical: (TX, N1, M0)   Summary: (TX, N1, M0)  Current Dose: 46.8 Gy     Planned Dose:  50.4 Gy  Narrative . . . . . . . . The patient presents for routine under treatment assessment.                                   The patient is having more problems with esophageal symptoms. When he swallows food or liquids he has pain for approximately one hour afterward. He feels plain water is the most bothersome. He does have some dizziness with standing, but is careful with this issue. He is using hydrocodone and Carafate for his symptoms                                 Set-up films were reviewed.                                 The chart was checked. Physical Findings. . .  height is 5\' 11"  (1.803 m) and weight is 172 lb 1.6 oz (78.064 kg). His oral temperature is 97.4 F (36.3 C). His blood pressure is 130/70 and his pulse is 79. His oxygen saturation is 98%. . The patient exhibits significant orthostatic blood pressure changes.  Impression . . . . . . . The patient is tolerating radiation. Plan . . . . . . . . . . . . Continue treatment as planned. Complete blood count and chemistries today. He will be set up for IV fluids at 11:30. May need additional IV fluids later this week. He is scheduled to complete his treatment for May 21.  We will place a call into his cardiologist to see if blood pressure medications may need adjustment for the short-term.  ________________________________   Blair Promise, PhD, MD

## 2013-12-25 ENCOUNTER — Ambulatory Visit
Admission: RE | Admit: 2013-12-25 | Discharge: 2013-12-25 | Disposition: A | Discharge status: Home / Self Care | Payer: Medicare Other | Source: Ambulatory Visit | Attending: Radiation Oncology | Admitting: Radiation Oncology

## 2013-12-26 ENCOUNTER — Ambulatory Visit
Admission: RE | Admit: 2013-12-26 | Discharge: 2013-12-26 | Disposition: A | Discharge status: Home / Self Care | Payer: Medicare Other | Source: Ambulatory Visit | Attending: Radiation Oncology | Admitting: Radiation Oncology

## 2013-12-26 ENCOUNTER — Encounter: Payer: Self-pay | Admitting: Radiation Oncology

## 2013-12-26 ENCOUNTER — Other Ambulatory Visit: Payer: Self-pay | Admitting: Oncology

## 2013-12-26 ENCOUNTER — Ambulatory Visit: Payer: Medicare Other

## 2013-12-26 VITALS — BP 108/70 | HR 109 | Temp 98.1°F | Ht 71.0 in | Wt 172.0 lb

## 2013-12-26 DIAGNOSIS — C160 Malignant neoplasm of cardia: Secondary | ICD-10-CM

## 2013-12-26 MED ORDER — SODIUM CHLORIDE 0.9 % IV SOLN
Freq: Once | INTRAVENOUS | Status: AC
  Administered 2013-12-26: 11:00:00 via INTRAVENOUS

## 2013-12-26 MED ORDER — SODIUM CHLORIDE 0.9 % IV SOLN
Freq: Once | INTRAVENOUS | Status: DC
  Filled 2013-12-26: qty 1000

## 2013-12-26 MED ORDER — FENTANYL 25 MCG/HR TD PT72: 25.0000 ug | MEDICATED_PATCH | TRANSDERMAL | Status: DC

## 2013-12-26 NOTE — Progress Notes (Signed)
  Radiation Oncology         [phone] ________________________________  Name: Charles [name] MRN: [id number]  Date: 12/26/2013  DOB: Apr 22, 1935  Weekly Radiation Therapy Management  Current Dose: 50.4 Gy     Planned Dose:  50.4 Gy  Narrative . . . . . . . . The patient presents for routine under treatment assessment.                                   The patient is happy to complete his radiation therapy. He is accompanied by his wife today. He has great difficulty swallowing liquids. He is able to take in some limited soft foods at this time. He has significant pain food ingestion.  Patient was given a prescription for Duragesic 25 mcg per hour                                 Set-up films were reviewed.                                 The chart was checked. Physical Findings. . .  height is 5\' 11"  (1.803 m) and weight is 172 lb (78.019 kg). His temperature is 98.1 F (36.7 C). His blood pressure is 108/70 and his pulse is 109. .  Impression . . . . . . . radiation complete. Plan . . . . . . . . . . . . IV fluid supplementation today and Saturday. He will likely need additional IV fluid supplementation next week unless Duragesic is quite helpful for his pain /esophagitis issues. He will call Tuesday morning to give [place] a status report  ________________________________   Blair Promise, PhD, MD

## 2013-12-26 NOTE — Progress Notes (Signed)
Charles [name] has received 28/28 fractions ton the [name].  He denies pain at the moment.

## 2013-12-27 ENCOUNTER — Other Ambulatory Visit: Payer: Self-pay | Admitting: Medical Oncology

## 2013-12-27 ENCOUNTER — Ambulatory Visit (INDEPENDENT_AMBULATORY_CARE_PROVIDER_SITE_OTHER): Payer: Medicare Other | Admitting: *Deleted

## 2013-12-27 ENCOUNTER — Ambulatory Visit: Payer: Medicare Other

## 2013-12-27 DIAGNOSIS — I639 Cerebral infarction, unspecified: Secondary | ICD-10-CM

## 2013-12-27 DIAGNOSIS — I6350 Cerebral infarction due to unspecified occlusion or stenosis of unspecified cerebral artery: Secondary | ICD-10-CM

## 2013-12-27 LAB — MDC_IDC_ENUM_SESS_TYPE_REMOTE

## 2013-12-28 ENCOUNTER — Ambulatory Visit (HOSPITAL_BASED_OUTPATIENT_CLINIC_OR_DEPARTMENT_OTHER): Payer: Medicare Other

## 2013-12-28 VITALS — BP 119/77 | HR 116 | Temp 97.2°F

## 2013-12-28 DIAGNOSIS — E860 Dehydration: Secondary | ICD-10-CM

## 2013-12-28 DIAGNOSIS — C160 Malignant neoplasm of cardia: Secondary | ICD-10-CM

## 2013-12-28 MED ORDER — SODIUM CHLORIDE 0.9 % IV SOLN
Freq: Once | INTRAVENOUS | Status: AC
  Administered 2013-12-28: 09:00:00 via INTRAVENOUS

## 2013-12-28 NOTE — Patient Instructions (Signed)
Dehydration, Adult Dehydration is when you lose more fluids from the body than you take in. Vital organs like the kidneys, brain, and heart cannot function without a proper amount of fluids and salt. Any loss of fluids from the body can cause dehydration.  CAUSES   Vomiting.  Diarrhea.  Excessive sweating.  Excessive urine output.  Fever. SYMPTOMS  Mild dehydration  Thirst.  Dry lips.  Slightly dry mouth. Moderate dehydration  Very dry mouth.  Sunken eyes.  Skin does not bounce back quickly when lightly pinched and released.  Dark urine and decreased urine production.  Decreased tear production.  Headache. Severe dehydration  Very dry mouth.  Extreme thirst.  Rapid, weak pulse (more than 100 beats per minute at rest).  Cold hands and feet.  Not able to sweat in spite of heat and temperature.  Rapid breathing.  Blue lips.  Confusion and lethargy.  Difficulty being awakened.  Minimal urine production.  No tears. DIAGNOSIS  Your caregiver will diagnose dehydration based on your symptoms and your exam. Blood and urine tests will help confirm the diagnosis. The diagnostic evaluation should also identify the cause of dehydration. TREATMENT  Treatment of mild or moderate dehydration can often be done at home by increasing the amount of fluids that you drink. It is best to drink small amounts of fluid more often. Drinking too much at one time can make vomiting worse. Refer to the home care instructions below. Severe dehydration needs to be treated at the hospital where you will probably be given intravenous (IV) fluids that contain water and electrolytes. HOME CARE INSTRUCTIONS   Ask your caregiver about specific rehydration instructions.  Drink enough fluids to keep your urine clear or pale yellow.  Drink small amounts frequently if you have nausea and vomiting.  Eat as you normally do.  Avoid:  Foods or drinks high in sugar.  Carbonated  drinks.  Juice.  Extremely hot or cold fluids.  Drinks with caffeine.  Fatty, greasy foods.  Alcohol.  Tobacco.  Overeating.  Gelatin desserts.  Wash your hands well to avoid spreading bacteria and viruses.  Only take over-the-counter or prescription medicines for pain, discomfort, or fever as directed by your caregiver.  Ask your caregiver if you should continue all prescribed and over-the-counter medicines.  Keep all follow-up appointments with your caregiver. SEEK MEDICAL CARE IF:  You have abdominal pain and it increases or stays in one area (localizes).  You have a rash, stiff neck, or severe headache.  You are irritable, sleepy, or difficult to awaken.  You are weak, dizzy, or extremely thirsty. SEEK IMMEDIATE MEDICAL CARE IF:   You are unable to keep fluids down or you get worse despite treatment.  You have frequent episodes of vomiting or diarrhea.  You have blood or green matter (bile) in your vomit.  You have blood in your stool or your stool looks black and tarry.  You have not urinated in 6 to 8 hours, or you have only urinated a small amount of very dark urine.  You have a fever.  You faint. MAKE SURE YOU:   Understand these instructions.  Will watch your condition.  Will get help right away if you are not doing well or get worse. Document Released: 07/25/2005 Document Revised: 10/17/2011 Document Reviewed: 03/14/2011 [employer] Patient Information 2014 [employer].  

## 2013-12-31 ENCOUNTER — Ambulatory Visit (HOSPITAL_BASED_OUTPATIENT_CLINIC_OR_DEPARTMENT_OTHER): Payer: Medicare Other

## 2013-12-31 ENCOUNTER — Telehealth: Payer: Self-pay | Admitting: *Deleted

## 2013-12-31 ENCOUNTER — Other Ambulatory Visit: Payer: Self-pay | Admitting: Nurse Practitioner

## 2013-12-31 DIAGNOSIS — E860 Dehydration: Secondary | ICD-10-CM

## 2013-12-31 DIAGNOSIS — C160 Malignant neoplasm of cardia: Secondary | ICD-10-CM

## 2013-12-31 LAB — CBC WITH DIFFERENTIAL/PLATELET
BASO%: 0.8 % (ref 0.0–2.0)
Basophils Absolute: 0 10*3/uL (ref 0.0–0.1)
EOS%: 0.3 % (ref 0.0–7.0)
Eosinophils Absolute: 0 10*3/uL (ref 0.0–0.5)
HCT: 36 % — ABNORMAL LOW (ref 38.4–49.9)
HGB: 11.9 g/dL — ABNORMAL LOW (ref 13.0–17.1)
LYMPH%: 9.9 % — ABNORMAL LOW (ref 14.0–49.0)
MCH: 30 pg (ref 27.2–33.4)
MCHC: 33.2 g/dL (ref 32.0–36.0)
MCV: 90.5 fL (ref 79.3–98.0)
MONO#: 0.8 10*3/uL (ref 0.1–0.9)
MONO%: 26.9 % — AB (ref 0.0–14.0)
NEUT#: 1.8 10*3/uL (ref 1.5–6.5)
NEUT%: 62.1 % (ref 39.0–75.0)
Platelets: 155 10*3/uL (ref 140–400)
RBC: 3.98 10*6/uL — AB (ref 4.20–5.82)
RDW: 14.4 % (ref 11.0–14.6)
WBC: 2.9 10*3/uL — ABNORMAL LOW (ref 4.0–10.3)
lymph#: 0.3 10*3/uL — ABNORMAL LOW (ref 0.9–3.3)

## 2013-12-31 LAB — COMPREHENSIVE METABOLIC PANEL (CC13)
ALBUMIN: 3.4 g/dL — AB (ref 3.5–5.0)
ALK PHOS: 59 U/L (ref 40–150)
ALT: 22 U/L (ref 0–55)
AST: 30 U/L (ref 5–34)
Anion Gap: 11 mEq/L (ref 3–11)
BILIRUBIN TOTAL: 0.83 mg/dL (ref 0.20–1.20)
BUN: 28.8 mg/dL — ABNORMAL HIGH (ref 7.0–26.0)
CO2: 23 mEq/L (ref 22–29)
Calcium: 9.7 mg/dL (ref 8.4–10.4)
Chloride: 108 mEq/L (ref 98–109)
Creatinine: 1.3 mg/dL (ref 0.7–1.3)
Glucose: 130 mg/dl (ref 70–140)
POTASSIUM: 3.6 meq/L (ref 3.5–5.1)
SODIUM: 142 meq/L (ref 136–145)
TOTAL PROTEIN: 6.4 g/dL (ref 6.4–8.3)

## 2013-12-31 MED ORDER — SODIUM CHLORIDE 0.9 % IV SOLN
INTRAVENOUS | Status: AC
  Administered 2013-12-31: 15:00:00 via INTRAVENOUS

## 2013-12-31 NOTE — Telephone Encounter (Signed)
Reviewed RN's notes below - agree patient needs IVF. Orders placed for patient to come in and have 2 liters of Normal Saline.  We can see him/ assess in infusion when he arrives.   A/P 1. Dehydration -  Ordering IV fluids & labs (CBC, CMP)  2. Pain - uncertain how long this new back pain has been present; will ask further questions once he arrives 3. Constipation-  Patient has not had stool in over 72 hours; will assess what regimen and meds he has used so far; highly likely this is narcotic-induced, exacerbated by poor PO intake; if he has not tried [employer] or Miralax will recommend those.  4. We will see him when he arrives for fluids.   Burns Spain AOCNP, NP-C

## 2013-12-31 NOTE — Telephone Encounter (Signed)
   Provider input needed: Dehydration   Reason for call: Request IVF.  Constitutional: positive for Xerostomia/dry mouth Ears, nose, mouth, throat, and face: positive for sore mouth and sore throat Gastrointestinal: positive for constipation Pain   ALLERGIES:  is allergic to percocet; atorvastatin; ezetimibe; lovastatin; ranexa; and tramadol.  Patient last received chemotherapy/ treatment on 12-19-2013  Patient was last seen in the office on 12-12-2013  Next appt is 01-02-2013  Is patient having fevers greater than 100.5?  no   Is patient having uncontrolled pain, or new pain? Pain to mouth since radiation.  Burns and his saliva is thick.  Takes Hycet and wears a 25 mcg fentanyl patch   Is patient having new back pain that changes with position (worsens or eases when laying down?)  yes, "pain to back that moves around to both sides, hurts worse when he turns,  He winces, grabs his back and goes to lie down."  Is patient able to eat and drink? no, takes Carafate before he tries to eat and at bedtime but he isn't able to eat a meal.  Very little measurable intake.  He has tried to sip but may have consumed 1 - 2 TBL.        caregiver calls 12/31/2013 with complaint of  Gastrointestinal: positive for constipation Xertostomia/mouth pain and dehydration.  He's tried popsicles, jello, and nothing helps.  Wife checked his journal as patient is outside and she can't get him to stop pushing himself to get things done around the house.  Journal reads small amounts urine output until Sunday 12-29-2013 and no notes of a bowel movement.  [name] says he has received IVF x 3 days and could use more today.         Summary Based on the above information advised family to  Await return call to her at [phone] after this information is reviewed by an advance care professional.   [name]  12/31/2013, 10:51 AM   Background Charles [name]   DOB: 02/28/1935   MR#:  [id number]   CSN#   [id number] 12/31/2013

## 2013-12-31 NOTE — Progress Notes (Signed)
  Chief complaint: dehydration  Interval History:  Charles [name] seen today in infusion after being worked in for dehydration, pain and constipation.  His main complaint is pain when he eats (uncontrolled with Duragesic 25 mcg patch) -but he states he is not in pain except when he swallows and declined need for any additional pain meds. ROS otherwise negative  Physical Exam: HEAD:  EOMI, mouth dry but no yeast or erythema noted LUNGS: CTA  No rales or rhonchi CV: S1 S2 RRR ABD: soft; bowel sounds present; mildly distended EXT: no swelling noted SKIN: pale and dry   Lab Results  Component Value Date   WBC 2.9* 12/31/2013   HGB 11.9* 12/31/2013   HCT 36.0* 12/31/2013   PLT 155 12/31/2013   GLUCOSE 130 12/31/2013   CHOL 167 08/07/2013   TRIG 165* 08/07/2013   HDL 52 08/07/2013   LDLDIRECT 151.6 07/25/2011   LDLCALC 82 08/07/2013   ALT 22 12/31/2013   AST 30 12/31/2013   NA 142 12/31/2013   K 3.6 12/31/2013   CL 107 10/17/2013   CREATININE 1.3 12/31/2013   BUN 28.8* 12/31/2013   CO2 23 12/31/2013   INR 1.08 08/06/2013   HGBA1C 6.2* 08/07/2013   MICROALBUR 2.4* 07/25/2011   A/P 1. GE cancer -s/p chemo/ radiation - completed radiation last week - follow up with Dr. Benay Spice on Thursday 5/28 2. Constipation -  Last bowel movement 5 days ago - advised to take 2 dulcolax tonight & take enema tomorrow morning if no results (Plts 155) although he has decreased oral intake he is eating some and should not go longer than 72 hours without movement. He does feel slightly uncomfortable 3. Pain - he will continue using the Duragesic patch and carafate, and hycet prior to eating 4. Dehydration - receiving IV fluids now.  Charles [name], AOCNP, NP-C

## 2013-12-31 NOTE — Telephone Encounter (Addendum)
Patient notified to come in at 2:30 pm for labs and IVF will begin approximately after lab appointment.  Original communication with infusion charge nurse was a request for IVF.  Orders for 2L IVF requested and [place] unable to perform.  [place] notified.  Orders received to give one liter and can infuse over one hour.

## 2013-12-31 NOTE — Patient Instructions (Signed)
Dehydration, Adult Dehydration is when you lose more fluids from the body than you take in. Vital organs like the kidneys, brain, and heart cannot function without a proper amount of fluids and salt. Any loss of fluids from the body can cause dehydration.  CAUSES   Vomiting.  Diarrhea.  Excessive sweating.  Excessive urine output.  Fever. SYMPTOMS  Mild dehydration  Thirst.  Dry lips.  Slightly dry mouth. Moderate dehydration  Very dry mouth.  Sunken eyes.  Skin does not bounce back quickly when lightly pinched and released.  Dark urine and decreased urine production.  Decreased tear production.  Headache. Severe dehydration  Very dry mouth.  Extreme thirst.  Rapid, weak pulse (more than 100 beats per minute at rest).  Cold hands and feet.  Not able to sweat in spite of heat and temperature.  Rapid breathing.  Blue lips.  Confusion and lethargy.  Difficulty being awakened.  Minimal urine production.  No tears. DIAGNOSIS  Your caregiver will diagnose dehydration based on your symptoms and your exam. Blood and urine tests will help confirm the diagnosis. The diagnostic evaluation should also identify the cause of dehydration. TREATMENT  Treatment of mild or moderate dehydration can often be done at home by increasing the amount of fluids that you drink. It is best to drink small amounts of fluid more often. Drinking too much at one time can make vomiting worse. Refer to the home care instructions below. Severe dehydration needs to be treated at the hospital where you will probably be given intravenous (IV) fluids that contain water and electrolytes. HOME CARE INSTRUCTIONS   Ask your caregiver about specific rehydration instructions.  Drink enough fluids to keep your urine clear or pale yellow.  Drink small amounts frequently if you have nausea and vomiting.  Eat as you normally do.  Avoid:  Foods or drinks high in sugar.  Carbonated  drinks.  Juice.  Extremely hot or cold fluids.  Drinks with caffeine.  Fatty, greasy foods.  Alcohol.  Tobacco.  Overeating.  Gelatin desserts.  Wash your hands well to avoid spreading bacteria and viruses.  Only take over-the-counter or prescription medicines for pain, discomfort, or fever as directed by your caregiver.  Ask your caregiver if you should continue all prescribed and over-the-counter medicines.  Keep all follow-up appointments with your caregiver. SEEK MEDICAL CARE IF:  You have abdominal pain and it increases or stays in one area (localizes).  You have a rash, stiff neck, or severe headache.  You are irritable, sleepy, or difficult to awaken.  You are weak, dizzy, or extremely thirsty. SEEK IMMEDIATE MEDICAL CARE IF:   You are unable to keep fluids down or you get worse despite treatment.  You have frequent episodes of vomiting or diarrhea.  You have blood or green matter (bile) in your vomit.  You have blood in your stool or your stool looks black and tarry.  You have not urinated in 6 to 8 hours, or you have only urinated a small amount of very dark urine.  You have a fever.  You faint. MAKE SURE YOU:   Understand these instructions.  Will watch your condition.  Will get help right away if you are not doing well or get worse. Document Released: 07/25/2005 Document Revised: 10/17/2011 Document Reviewed: 03/14/2011 [employer] Patient Information 2014 [employer].  

## 2014-01-02 ENCOUNTER — Ambulatory Visit: Payer: Medicare Other | Admitting: Oncology

## 2014-01-02 ENCOUNTER — Ambulatory Visit (HOSPITAL_BASED_OUTPATIENT_CLINIC_OR_DEPARTMENT_OTHER): Payer: Medicare Other | Admitting: Oncology

## 2014-01-02 ENCOUNTER — Telehealth: Payer: Self-pay | Admitting: Oncology

## 2014-01-02 VITALS — BP 93/54 | HR 80 | Temp 97.7°F | Resp 18 | Ht 71.0 in | Wt 169.4 lb

## 2014-01-02 DIAGNOSIS — I10 Essential (primary) hypertension: Secondary | ICD-10-CM

## 2014-01-02 DIAGNOSIS — K208 Other esophagitis without bleeding: Secondary | ICD-10-CM

## 2014-01-02 DIAGNOSIS — C160 Malignant neoplasm of cardia: Secondary | ICD-10-CM

## 2014-01-02 DIAGNOSIS — R1310 Dysphagia, unspecified: Secondary | ICD-10-CM

## 2014-01-02 DIAGNOSIS — E119 Type 2 diabetes mellitus without complications: Secondary | ICD-10-CM

## 2014-01-02 NOTE — Progress Notes (Signed)
  [facility] OFFICE PROGRESS NOTE   Diagnosis: Esophagus cancer  INTERVAL HISTORY:   Charles [name] returns as scheduled. He completed a final cycle of chemotherapy 12/19/2013 and radiation on 12/26/2013.  He complains of severe pain in the subxiphoid region after eating or drinking. This is much improved today. He did not need hydrocodone today. He continues a Duragesic patch.  He otherwise feels well.  Objective:  Vital signs in last 24 hours:  Blood pressure 93/54, pulse 80, temperature 97.7 F (36.5 C), temperature source Oral, resp. rate 18, height 5\' 11"  (1.803 m), weight 169 lb 6.4 oz (76.839 kg).    HEENT: No thrush or ulcers Resp: Lungs clear bilaterally Cardio: Regular rate and rhythm GI: No hepatomegaly, nontender, no mass Vascular: No leg edema   Lab Results:  Lab Results  Component Value Date   WBC 2.9* 12/31/2013   HGB 11.9* 12/31/2013   HCT 36.0* 12/31/2013   MCV 90.5 12/31/2013   PLT 155 12/31/2013   NEUTROABS 1.8 12/31/2013     Medications: I have reviewed the patient's current medications.  Assessment/Plan: 1. 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 Radiation completed 12/26/2013. 2. History of coronary artery disease-status post coronary artery bypass surgery and placement of coronary stents  3. History of a TIA on 2 occasions  4. Remote history of histoplasmosis  5. Diabetes  6. Hypertension  7. Arrhythmia  8. 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   Disposition:  Charles [name] has completed the planned course of chemotherapy/radiation. The odynophagia should improve over the next week. He will discontinue the Duragesic patch if  he does not have to take pain medication today and tomorrow. He will return for an office visit 02/06/2014. We will consider obtaining a restaging CT evaluation and upper endoscopy in 3-4 months.  Ladell Pier, MD  01/02/2014  5:28 PM

## 2014-01-02 NOTE — Telephone Encounter (Signed)
, °

## 2014-01-06 ENCOUNTER — Encounter: Payer: Self-pay | Admitting: Radiation Oncology

## 2014-01-06 NOTE — Progress Notes (Signed)
  Radiation Oncology         [phone] ________________________________  Name: Charles [name] MRN: [id number]  Date: 01/06/2014  DOB: Dec 22, 1934  End of Treatment Note  Diagnosis:   Gastroesophageal cancer   Primary site: Esophagus - Adenocarcinoma   Staging method: AJCC 7th Edition   Clinical: (Grant, N1, M0)   Summary: (TX, N1, M0)      Indication for treatment:  Definitive treatment along with radiosensitizing chemotherapy     Radiation treatment dates:   April 14 through May 21  Site/dose:   Gastroesophageal junction area, 50.4 gray in 28 fractions  Beams/energy:   Intensity modulated radiation therapy, rapid arc  Narrative: The patient tolerated radiation treatment relatively well.   During the latter portion of his treatment the patient developed esophageal symptoms which interfered with food intake. Patient did require pain medication for this issue as well as IV fluid supplementation.  Plan: The patient has completed radiation treatment. The patient will return to radiation oncology clinic for  followup in one week. I advised them to call or return sooner if they have any questions or concerns related to their recovery or treatment.  -----------------------------------  Blair Promise, PhD, MD

## 2014-01-08 ENCOUNTER — Encounter: Payer: Self-pay | Admitting: Internal Medicine

## 2014-01-08 ENCOUNTER — Ambulatory Visit (INDEPENDENT_AMBULATORY_CARE_PROVIDER_SITE_OTHER): Payer: Medicare Other | Admitting: Internal Medicine

## 2014-01-08 VITALS — BP 125/75 | HR 80 | Temp 97.9°F | Wt 167.0 lb

## 2014-01-08 DIAGNOSIS — C160 Malignant neoplasm of cardia: Secondary | ICD-10-CM

## 2014-01-08 DIAGNOSIS — I6350 Cerebral infarction due to unspecified occlusion or stenosis of unspecified cerebral artery: Secondary | ICD-10-CM

## 2014-01-08 NOTE — Progress Notes (Signed)
Subjective:    Patient ID: Charles [name], male    DOB: 06/03/1935, 79 y.o.   MRN: [id number]  DOS:  01/08/2014 Type of  Visit: to discuss his medical status, here with his wife  History: The patient was diagnosed with gastroesophageal cancer, status post chemotherapy and radiation therapy, he obviously had pain from that radiation therapy , that is gradually improving. Saw oncology recently, he was somehow surprise that no further testing was ordered immediately and liked to discuss that with me. He read that his cancer has a very poor prognosis     Past Medical History  Diagnosis Date  . Hypertension   . Hyperlipidemia     diet controlled, statins and zetia intolerant  . Retinopathy     mild glaucomea  . Transient ischemic attack 1999  . Histoplasmosis   . Chronic cough 08/2009    -allergy profile- june 2,2011- neg. -Max GERD rx/ off oils January 07 2010 x 3 weeks only but no benefit. - Sinus CT rec July 11,2011- refused due to claustrophobia -MCT neg for asthma 03/01/10 -Add 1st Gen H1 July 11,2011- better  . Macular degeneration   . Coronary artery disease     CABG 01-2013, stent 04-2013    . Diabetes mellitus     no meds  . PONV (postoperative nausea and vomiting)     extreme nausea  . Heart murmur     TIA  . Adenocarcinoma of gastroesophageal junction   . Stroke 1995    tia 2 years ago    Past Surgical History  Procedure Laterality Date  . Cervical spine surgery  2002    plate  . Cholecystectomy  1985  . Percutaneous coronary stent intervention (pci-s)  101/31/2014    DrHhARDING  . Coronary artery bypass graft N/A 01/21/2013    Procedure: Coronary Arery Bypass Grafting  Times Five Using Left Internal Mammary Artery and Right Saphenous Leg Vein Harvested Endoscopically;  Surgeon: Melrose Nakayama, MD;  Location: [place];  Service: Open Heart Surgery;  Laterality: N/A;  . Tee without cardioversion N/A 08/23/2013    Procedure: TRANSESOPHAGEAL ECHOCARDIOGRAM (TEE);   Surgeon: Sanda Klein, MD;  Location: [facility] END[facility]COPY;  Service: Cardiovascular;  Laterality: N/A;  . Esophagogastroduodenoscopy N/A 10/17/2013    Procedure: ESOPHAGOGASTRODUODENOSCOPY (EGD);  Surgeon: Winfield Cunas., MD;  Location: [facility];  Service: Endoscopy;  Laterality: N/A;  . Appendectomy      History   Social History  . Marital Status: Married    Spouse Name: [name]    Number of Children: 2  . Years of Education: college   Occupational History  . reitred    Social History Main Topics  . Smoking status: Former Smoker -- 1.00 packs/day for 10 years    Types: Cigarettes    Quit date: 01/17/1977  . Smokeless tobacco: Never Used     Comment: Stopped at age 73   . Alcohol Use: No     Comment: seldom  . Drug Use: No  . Sexual Activity: No   Other Topics Concern  . 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  Always use your most recent med list.  aspirin EC 81 MG tablet  Take 1 tablet (81 mg total) by mouth daily.     CoQ10 100 MG Caps  Take 100 mg by mouth daily.     diazepam 2 MG tablet  Commonly known as:  VALIUM  Take 1 tablet (2 mg total) by mouth at bedtime as needed.     diphenhydramine-acetaminophen 25-500 MG Tabs  Commonly known as:  TYLENOL PM  Take 1 tablet by mouth at bedtime as needed (for sleep).     EYE VITAMINS Tabs  Take 1 tablet by mouth daily.     fentaNYL 25 MCG/HR patch  Commonly known as:  DURAGESIC - dosed mcg/hr  Place 1 patch (25 mcg total) onto the skin every 3 (three) days.     finasteride 5 MG tablet  Commonly known as:  PROSCAR  Take 5 mg by mouth every morning.     HYDROcodone-acetaminophen 7.5-325 mg/15 ml solution  Commonly known as:  HYCET  Take 10 mLs by mouth 4  (four) times daily as needed for moderate pain.     isosorbide mononitrate 60 MG 24 hr tablet  Commonly known as:  IMDUR  Take 1 tablet (60 mg total) by mouth daily.     lactulose (encephalopathy) 10 GM/15ML Soln  Commonly known as:  GENERLAC  Take 15 mLs (10 g total) by mouth as needed.     losartan 50 MG tablet  Commonly known as:  COZAAR  Take 50 mg by mouth daily.     metoprolol tartrate 25 MG tablet  Commonly known as:  LOPRESSOR  Take 1 tablet (25 mg total) by mouth 2 (two) times daily.     nitroGLYCERIN 0.4 MG SL tablet  Commonly known as:  NITROSTAT  Place 0.4 mg under the tongue every 5 (five) minutes as needed for chest pain.     polyethylene glycol packet  Commonly known as:  MIRALAX / GLYCOLAX  Take 17 g by mouth daily.     pravastatin 40 MG tablet  Commonly known as:  PRAVACHOL  TAKE 1 TABLET BY MOUTH EVERY EVENING     prochlorperazine 10 MG tablet  Commonly known as:  COMPAZINE  Take 1 tablet (10 mg total) by mouth every 6 (six) hours as needed for nausea.     sucralfate 1 GM/10ML suspension  Commonly known as:  CARAFATE  Take 10 mLs (1 g total) by mouth 4 (four) times daily -  with meals and at bedtime.     Vitamin D3 2000 UNITS Tabs  Take 2,000 Units by mouth daily.           Objective:   Physical Exam BP 125/75  Pulse 80  Temp(Src) 97.9 F (36.6 C)  Wt 167 lb (75.751 kg)  SpO2 98% General -- alert, well-developed, NAD.    Psych-- Cognition and judgment appear intact. Cooperative with normal attention span and concentration. No anxious or depressed appearing, At the end of the visit he got quite emotional.       Assessment & Plan:  Today , I spent more than  min with the patient: >50% of the time counseling regards cancer, see a/p  Also  reviewing the chart and labs ordered by other providers

## 2014-01-08 NOTE — Progress Notes (Signed)
Pre visit review using our clinic review tool, if applicable. No additional management support is needed unless otherwise documented below in the visit note. 

## 2014-01-09 ENCOUNTER — Ambulatory Visit (INDEPENDENT_AMBULATORY_CARE_PROVIDER_SITE_OTHER): Payer: Medicare Other | Admitting: Cardiovascular Disease

## 2014-01-09 ENCOUNTER — Encounter: Payer: Self-pay | Admitting: Cardiovascular Disease

## 2014-01-09 VITALS — BP 120/60 | HR 70 | Resp 16 | Ht 71.0 in | Wt 166.6 lb

## 2014-01-09 DIAGNOSIS — I639 Cerebral infarction, unspecified: Secondary | ICD-10-CM

## 2014-01-09 DIAGNOSIS — I25810 Atherosclerosis of coronary artery bypass graft(s) without angina pectoris: Secondary | ICD-10-CM

## 2014-01-09 DIAGNOSIS — Z955 Presence of coronary angioplasty implant and graft: Secondary | ICD-10-CM

## 2014-01-09 DIAGNOSIS — I6350 Cerebral infarction due to unspecified occlusion or stenosis of unspecified cerebral artery: Secondary | ICD-10-CM

## 2014-01-09 DIAGNOSIS — Z9861 Coronary angioplasty status: Secondary | ICD-10-CM

## 2014-01-09 DIAGNOSIS — I441 Atrioventricular block, second degree: Secondary | ICD-10-CM

## 2014-01-09 LAB — MDC_IDC_ENUM_SESS_TYPE_INCLINIC
Date Time Interrogation Session: 20150604193605
Zone Setting Detection Interval: 2000 ms
Zone Setting Detection Interval: 3000 ms
Zone Setting Detection Interval: 390 ms

## 2014-01-09 LAB — PACEMAKER DEVICE OBSERVATION

## 2014-01-09 NOTE — Assessment & Plan Note (Signed)
Chart reviewed, gastroesophageal cancer, status post chemotherapy and radiation therapy. Last visit with oncology 12/25/2013, plan is to reassess next month and consider CT and EGD. Pain from chemoradiation improving. We discussed the issue at length with the patient, patient is concerned because no further testing has been immediately ordered by  Oncology. I advised him that oncology  recommendations are base on trials, it is entirely possible that base on the latest research he does not need to get  CT immediately after treatment. Emotional support provided. Knows to call me if he needs any medication for anxiety, depression or insomnia. The patient is concerned about his prognosis, I know his prognosis is not very good but I think the best person to talk (in addition to oncology) would be his GI doctor  ---> refer to Dr Oletta Lamas

## 2014-01-09 NOTE — Patient Instructions (Addendum)
Call [phone] ([employer]) so that further troubleshooting can be performed on your transmitter.  Your physician recommends that you schedule a follow-up appointment in: 3 months + pacemaker check

## 2014-01-10 ENCOUNTER — Encounter: Payer: Self-pay | Admitting: Cardiovascular Disease

## 2014-01-10 NOTE — Assessment & Plan Note (Signed)
No documentation of initial fibrillation to date.

## 2014-01-10 NOTE — Progress Notes (Signed)
Patient ID: Charles [name], male   DOB: 02/05/1935, 79 y.o.   MRN: [id number]      Reason for office visit CAD, second degree AV block, hyperlipidemia  Quentin returns today roughly 2 weeks after completing chemotherapy and radiation therapy for a carcinoma at the gastroesophageal junction. He has had problems with severe dysphagia and has lost over 30 pounds in weight. Recently his weight loss seems to have stabilized. She can only eat very soft consistency foods in very small quantities. He even has difficulty swallowing water and this had to be admitted for intravenous fluids a couple of times.  He has not had anginal chest pain but has also been very sedentary. He has not had any syncopal events, dizziness, lightheadedness or other symptoms suggestive of severe bradycardia.  It has been roughly one year since he underwent five-vessel bypass surgery for multivessel CAD. He had to undergo sequential implantation of 2 stents only 3 months after bypass surgery for graft failure (initially ramus intermedius, subsequently right coronary artery, last procedure 06/07/2013). Only 3 months after that event he had a 30 minute TIA. Workup for the cause of the TIA was negative and he underwent implantation of a loop recorder in attempt to diagnose possible paroxysmal atrial fibrillation. Unexpectedly, the loop recorder showed evidence of prolonged episodes of second degree AV block, as well as one episode of 14 seconds of asystole, probably related to complete heart block. At all times the QRS complex remained narrow. Surprisingly all of these events were completely asymptomatic. No atrial fibrillation has been seen.  Pacemaker implantation was considered, but the diagnosis of esophageal cancer took precedence. Since that time no new episodes of bradycardia arrhythmias have been recorded.  Allergies  Allergen Reactions  . Percocet [Oxycodone-Acetaminophen] Other (See Comments)    hallucinations  .  Atorvastatin Other (See Comments)    Causes myalgias   . Ezetimibe Other (See Comments)    Causes dizziness  . Lovastatin Other (See Comments)    REACTION: weakness. Patient able to tolerate pravastatin  . Ranexa [Ranolazine] Other (See Comments)    Severe constipation  . Tramadol     Nausea and Vomiting    Current Outpatient Prescriptions  Medication Sig Dispense Refill  . aspirin EC 81 MG tablet Take 1 tablet (81 mg total) by mouth daily.  90 tablet  3  . Cholecalciferol (VITAMIN D3) 2000 UNITS TABS Take 2,000 Units by mouth daily.      . Coenzyme Q10 (COQ10) 100 MG CAPS Take 100 mg by mouth daily.      . diazepam (VALIUM) 2 MG tablet Take 1 tablet (2 mg total) by mouth at bedtime as needed.  30 tablet  4  . diphenhydramine-acetaminophen (TYLENOL PM) 25-500 MG TABS Take 1 tablet by mouth at bedtime as needed (for sleep).      . finasteride (PROSCAR) 5 MG tablet Take 5 mg by mouth every morning.       Marland Kitchen HYDROcodone-acetaminophen (HYCET) 7.5-325 mg/15 ml solution Take 10 mLs by mouth 4 (four) times daily as needed for moderate pain.  473 mL  0  . isosorbide mononitrate (IMDUR) 60 MG 24 hr tablet Take 1 tablet (60 mg total) by mouth daily.  30 tablet  6  . lactulose, encephalopathy, (GENERLAC) 10 GM/15ML SOLN Take 15 mLs (10 g total) by mouth as needed.  2700 mL  3  . losartan (COZAAR) 50 MG tablet Take 50 mg by mouth daily.      . metoprolol tartrate (  LOPRESSOR) 25 MG tablet Take 1 tablet (25 mg total) by mouth 2 (two) times daily.  180 tablet  3  . Multiple Vitamins-Minerals (EYE VITAMINS) TABS Take 1 tablet by mouth daily.      . nitroGLYCERIN (NITROSTAT) 0.4 MG SL tablet Place 0.4 mg under the tongue every 5 (five) minutes as needed for chest pain.      . polyethylene glycol (MIRALAX / GLYCOLAX) packet Take 17 g by mouth daily.       . pravastatin (PRAVACHOL) 40 MG tablet TAKE 1 TABLET BY MOUTH EVERY EVENING  90 tablet  3  . prochlorperazine (COMPAZINE) 10 MG tablet Take 1 tablet (10  mg total) by mouth every 6 (six) hours as needed for nausea.  60 tablet  1  . sucralfate (CARAFATE) 1 GM/10ML suspension Take 10 mLs (1 g total) by mouth 4 (four) times daily -  with meals and at bedtime.  420 mL  2   No current facility-administered medications for this visit.    Past Medical History  Diagnosis Date  . Hypertension   . Hyperlipidemia     diet controlled, statins and zetia intolerant  . Retinopathy     mild glaucomea  . Transient ischemic attack 1999  . Histoplasmosis   . Chronic cough 08/2009    -allergy profile- june 2,2011- neg. -Max GERD rx/ off oils January 07 2010 x 3 weeks only but no benefit. - Sinus CT rec July 11,2011- refused due to claustrophobia -MCT neg for asthma 03/01/10 -Add 1st Gen H1 July 11,2011- better  . Macular degeneration   . Coronary artery disease     CABG 01-2013, stent 04-2013    . Diabetes mellitus     no meds  . PONV (postoperative nausea and vomiting)     extreme nausea  . Heart murmur     TIA  . Adenocarcinoma of gastroesophageal junction   . Stroke 1995    tia 2 years ago    Past Surgical History  Procedure Laterality Date  . Cervical spine surgery  2002    plate  . Cholecystectomy  1985  . Percutaneous coronary stent intervention (pci-s)  101/31/2014    DrHhARDING  . Coronary artery bypass graft N/A 01/21/2013    Procedure: Coronary Arery Bypass Grafting  Times Five Using Left Internal Mammary Artery and Right Saphenous Leg Vein Harvested Endoscopically;  Surgeon: Melrose Nakayama, MD;  Location: [place];  Service: Open Heart Surgery;  Laterality: N/A;  . Tee without cardioversion N/A 08/23/2013    Procedure: TRANSESOPHAGEAL ECHOCARDIOGRAM (TEE);  Surgeon: Sanda Klein, MD;  Location: [facility] ENDOSCOPY;  Service: Cardiovascular;  Laterality: N/A;  . Esophagogastroduodenoscopy N/A 10/17/2013    Procedure: ESOPHAGOGASTRODUODENOSCOPY (EGD);  Surgeon: Winfield Cunas., MD;  Location: [facility];  Service: Endoscopy;  Laterality: N/A;   . Appendectomy      Family History  Problem Relation Age of Onset  . Coronary artery disease Neg Hx   . Stroke Neg Hx   . Colon cancer Neg Hx   . Prostate cancer Neg Hx   . Atopy Neg Hx   . Asthma Neg Hx   . Hypertension Mother   . Hypertension Father   . Diabetes Father   . Cancer Father     colon  . Hypertension Brother   . Cancer Brother     lung  . Hypertension Sister   . Cancer Sister     lung  . Diabetes Brother   . Diabetes Sister   .  Diabetes      Grandparents   . Lung cancer Brother   . Lung cancer Sister     History   Social History  . Marital Status: Married    Spouse Name: [name]    Number of Children: 2  . Years of Education: college   Occupational History  . reitred    Social History Main Topics  . Smoking status: Former Smoker -- 1.00 packs/day for 10 years    Types: Cigarettes    Quit date: 01/17/1977  . Smokeless tobacco: Never Used     Comment: Stopped at age 56   . Alcohol Use: No     Comment: seldom  . Drug Use: No  . Sexual Activity: No   Other Topics Concern  . 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 Swallowing causes epigastric pain that radiates to the right upper quadrant. The patient specifically denies any chest pain at rest or with exertion, dyspnea at rest or with exertion, orthopnea, paroxysmal nocturnal dyspnea, syncope, palpitations, focal neurological deficits, intermittent claudication, lower extremity edema, unexplained weight gain, cough, hemoptysis or wheezing. He also has severe constipation  The patient also denies diarrhea, polyuria, polydipsia, dysuria, hematuria, frequency, urgency, abnormal bleeding or bruising, fever, chills, unexpected weight changes, mood swings, change in skin or  hair texture, change in voice quality, auditory or visual problems, allergic reactions or rashes, new musculoskeletal complaints other than usual "aches and pains".\   PHYSICAL EXAM BP 120/60  Pulse 70  Resp 16  Ht 5\' 11"  (1.803 m)  Wt 166 lb 9.6 oz (75.569 kg)  BMI 23.25 kg/m2  General: Alert, oriented x3, no distress Head: no evidence of trauma, PERRL, EOMI, no exophtalmos or lid lag, no myxedema, no xanthelasma; normal ears, nose and oropharynx Neck: normal jugular venous pulsations and no hepatojugular reflux; brisk carotid pulses without delay and no carotid bruits Chest: clear to auscultation, no signs of consolidation by percussion or palpation, normal fremitus, symmetrical and full respiratory excursions, sternotomy scar Cardiovascular: normal position and quality of the apical impulse, regular rhythm, normal first and widely split second heart sounds, no murmurs, rubs or gallops Abdomen: no tenderness or distention, no masses by palpation, no abnormal pulsatility or arterial bruits, normal bowel sounds, no hepatosplenomegaly Extremities: no clubbing, cyanosis or edema; 2+ radial, ulnar and brachial pulses bilaterally; 2+ right femoral, posterior tibial and dorsalis pedis pulses; 2+ left femoral, posterior tibial and dorsalis pedis pulses; no subclavian or femoral bruits Neurological: grossly nonfocal   EKG: Sinus rhythm, right bundle branch block (chronic)  Lipid Panel     Component Value Date/Time   CHOL 167 08/07/2013 0700   TRIG 165* 08/07/2013 0700   HDL 52 08/07/2013 0700   CHOLHDL 3.2 08/07/2013 0700   VLDL 33 08/07/2013 0700   LDLCALC 82 08/07/2013 0700    BMET    Component Value Date/Time   NA 142 12/31/2013 1440   NA 145 10/17/2013 1442   K 3.6 12/31/2013 1440   K 4.4 10/17/2013 1442   CL 107 10/17/2013 1442   CO2 23 12/31/2013 1440   CO2 27 10/17/2013 1442   GLUCOSE 130 12/31/2013 1440   GLUCOSE 151* 10/17/2013 1442   BUN 28.8* 12/31/2013 1440   BUN  29*  10/17/2013 1442   CREATININE 1.3 12/31/2013 1440   CREATININE 1.46* 10/17/2013 1442   CREATININE 1.44* 05/23/2013 0809   CALCIUM 9.7 12/31/2013 1440   CALCIUM 9.3 10/17/2013 1442   GFRNONAA 45* 10/17/2013 1442   GFRAA 52* 10/17/2013 1442     ASSESSMENT AND PLAN Second degree AV block All episodes were recorded incidentally and were not associated with symptoms. None have occurred in the last roughly 3 months. Pacemaker implantation deferred in the presence of malignancy.  CAD- S/P CABG x 5 (LIMA-LAD, SVG-RI-OM1, SVG to distal RCA, SVG-PDA) 01/21/13    S/P SVG-RI/OM DES 05/06/13- staged DES to RCA 06/07/13 Off dual antiplatelet therapy due to esophageal malignancy in bleeding. On aspirin monotherapy.  CVA (cerebral infarction) No documentation of atrial fibrillation to date.   Patient Instructions  Call [phone] ([employer]) so that further troubleshooting can be performed on your transmitter.  Your physician recommends that you schedule a follow-up appointment in: 3 months + pacemaker check     Orders Placed This Encounter  Procedures  . Implantable device check   No orders of the defined types were placed in this encounter.    Stiles Maxcy  Sanda Klein, MD, [facility] [phone] office [phone] pager

## 2014-01-10 NOTE — Assessment & Plan Note (Signed)
Of dual antiplatelet therapy due to esophageal malignancy in bleeding. On aspirin monotherapy.

## 2014-01-10 NOTE — Assessment & Plan Note (Signed)
All episodes were recorded incidentally and were not associated with symptoms. None have occurred in the last roughly 3 months. Pacemaker implantation deferred in the presence of malignancy.

## 2014-01-13 ENCOUNTER — Encounter: Payer: Self-pay | Admitting: Cardiovascular Disease

## 2014-01-16 ENCOUNTER — Ambulatory Visit (INDEPENDENT_AMBULATORY_CARE_PROVIDER_SITE_OTHER): Payer: Medicare Other | Admitting: Internal Medicine

## 2014-01-16 ENCOUNTER — Encounter: Payer: Self-pay | Admitting: Internal Medicine

## 2014-01-16 VITALS — BP 119/70 | HR 88 | Temp 98.0°F | Wt 162.0 lb

## 2014-01-16 DIAGNOSIS — F329 Major depressive disorder, single episode, unspecified: Secondary | ICD-10-CM

## 2014-01-16 DIAGNOSIS — I6350 Cerebral infarction due to unspecified occlusion or stenosis of unspecified cerebral artery: Secondary | ICD-10-CM

## 2014-01-16 DIAGNOSIS — F419 Anxiety disorder, unspecified: Secondary | ICD-10-CM

## 2014-01-16 DIAGNOSIS — F341 Dysthymic disorder: Secondary | ICD-10-CM

## 2014-01-16 DIAGNOSIS — C160 Malignant neoplasm of cardia: Secondary | ICD-10-CM

## 2014-01-16 MED ORDER — HYDROCODONE-ACETAMINOPHEN 5-325 MG PO TABS: 1.0000 | ORAL_TABLET | Freq: Three times a day (TID) | ORAL | Status: DC | PRN

## 2014-01-16 MED ORDER — SUCRALFATE 1 GM/10ML PO SUSP
1.0000 g | Freq: Three times a day (TID) | ORAL | Status: DC
Start: 2014-01-16 — End: 2014-02-21

## 2014-01-16 NOTE — Assessment & Plan Note (Addendum)
He admits to depression, declined any medication at this time or a referral for counseling. He is counseled today

## 2014-01-16 NOTE — Progress Notes (Signed)
Pre visit review using our clinic review tool, if applicable. No additional management support is needed unless otherwise documented below in the visit note. 

## 2014-01-16 NOTE — Progress Notes (Signed)
Subjective:    Patient ID: Charles [name], male    DOB: Jan 27, 1935, 79 y.o.   MRN: [id number]  DOS:  01/16/2014 Type of  Visit: acute, wife requested visit due to abdominal  pain and depression? History: The patient admits to depression, Usually triggered or exacerbated by pain.  Reports on and off pain at the right upper quadrant of the abdomen, symptoms started at around the 4th week of radiation therapy. When he has pain, it may last few hours, usually increased by eating or drinking water. No associated nausea, vomiting, diarrhea or change in the color of the stools. No rash.  He also had pain in the chest after radiation therapy, that  pain is resolved.   ROS Denies blood in the stools. Occasionally constipation, no heartburn, normal dysphasia or odynophagia. has not weight loss per his own scales actually he has gained a pound lately. No suicidal ideas. He has developed a nonproductive cough for the last 2 weeks.  Past Medical History  Diagnosis Date  . Hypertension   . Hyperlipidemia     diet controlled, statins and zetia intolerant  . Retinopathy     mild glaucomea  . Transient ischemic attack 1999  . Histoplasmosis   . Chronic cough 08/2009    -allergy profile- june 2,2011- neg. -Max GERD rx/ off oils January 07 2010 x 3 weeks only but no benefit. - Sinus CT rec July 11,2011- refused due to claustrophobia -MCT neg for asthma 03/01/10 -Add 1st Gen H1 July 11,2011- better  . Macular degeneration   . Coronary artery disease     CABG 01-2013, stent 04-2013    . Diabetes mellitus     no meds  . PONV (postoperative nausea and vomiting)     extreme nausea  . Heart murmur     TIA  . Adenocarcinoma of gastroesophageal junction   . Stroke 1995    tia 2 years ago    Past Surgical History  Procedure Laterality Date  . Cervical spine surgery  2002    plate  . Cholecystectomy  1985  . Percutaneous coronary stent intervention (pci-s)  101/31/2014    DrHhARDING  . Coronary  artery bypass graft N/A 01/21/2013    Procedure: Coronary Arery Bypass Grafting  Times Five Using Left Internal Mammary Artery and Right Saphenous Leg Vein Harvested Endoscopically;  Surgeon: Melrose Nakayama, MD;  Location: [facility];  Service: Open Heart Surgery;  Laterality: N/A;  . Tee without cardioversion N/A 08/23/2013    Procedure: TRANSESOPHAGEAL ECHOCARDIOGRAM (TEE);  Surgeon: Sanda Klein, MD;  Location[facility] END[facility]COPY;  Service: Cardiovascular;  Laterality: N/A;  . Esophagogastroduodenoscopy N/A 10/17/2013    Procedure: ESOPHAGOGASTRODUODENOSCOPY (EGD);  Surgeon: Winfield Cunas., MD;  Location: [facility];  Service: Endoscopy;  Laterality: N/A;  . Appendectomy      History   Social History  . Marital Status: Married    Spouse Name: [name]    Number of Children: 2  . Years of Education: college   Occupational History  . reitred    Social History Main Topics  . Smoking status: Former Smoker -- 1.00 packs/day for 10 years    Types: Cigarettes    Quit date: 01/17/1977  . Smokeless tobacco: Never Used     Comment: Stopped at age 74   . Alcohol Use: No     Comment: seldom  . Drug Use: No  . Sexual Activity: No   Other Topics Concern  . 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  Always use your most recent med list.               aspirin EC 81 MG tablet  Take 1 tablet (81 mg total) by mouth daily.     CoQ10 100 MG Caps  Take 100 mg by mouth daily.     diazepam 2 MG tablet  Commonly known as:  VALIUM  Take 1 tablet (2 mg total) by mouth at bedtime as needed.     diphenhydramine-acetaminophen 25-500 MG Tabs  Commonly known as:  TYLENOL PM  Take 1 tablet by mouth at bedtime as needed (for sleep).     EYE VITAMINS Tabs    Take 1 tablet by mouth daily.     finasteride 5 MG tablet  Commonly known as:  PROSCAR  Take 5 mg by mouth every morning.     HYDROcodone-acetaminophen 5-325 MG per tablet  Commonly known as:  NORCO/VICODIN  Take 1-2 tablets by mouth every 8 (eight) hours as needed.     isosorbide mononitrate 60 MG 24 hr tablet  Commonly known as:  IMDUR  Take 1 tablet (60 mg total) by mouth daily.     lactulose (encephalopathy) 10 GM/15ML Soln  Commonly known as:  GENERLAC  Take 15 mLs (10 g total) by mouth as needed.     losartan 50 MG tablet  Commonly known as:  COZAAR  Take 50 mg by mouth daily.     metoprolol tartrate 25 MG tablet  Commonly known as:  LOPRESSOR  Take 1 tablet (25 mg total) by mouth 2 (two) times daily.     nitroGLYCERIN 0.4 MG SL tablet  Commonly known as:  NITROSTAT  Place 0.4 mg under the tongue every 5 (five) minutes as needed for chest pain.     polyethylene glycol packet  Commonly known as:  MIRALAX / GLYCOLAX  Take 17 g by mouth daily.     pravastatin 40 MG tablet  Commonly known as:  PRAVACHOL  TAKE 1 TABLET BY MOUTH EVERY EVENING     prochlorperazine 10 MG tablet  Commonly known as:  COMPAZINE  Take 1 tablet (10 mg total) by mouth every 6 (six) hours as needed for nausea.     sucralfate 1 GM/10ML suspension  Commonly known as:  CARAFATE  Take 10 mLs (1 g total) by mouth 4 (four) times daily -  with meals and at bedtime.     Vitamin D3 2000 UNITS Tabs  Take 2,000 Units by mouth daily.           Objective:   Physical Exam  Skin:      BP 119/70  Pulse 88  Temp(Src) 98 F (36.7 C)  Wt 162 lb (73.483 kg)  SpO2 97%  General -- alert, well-developed, NAD.  Neck --[place] or  LAD HEENT-- Not pale.  Lungs -- normal respiratory effort, no intercostal retractions, no accessory muscle use, and normal breath sounds.  Heart-- normal rate, regular rhythm, no murmur.  Abdomen-- Not distended, good bowel sounds,soft, non-tender.  Back--not tender  to palpation Extremities-- no pretibial edema bilaterally   Psych-- Cognition and judgment appear intact. Cooperative with  normal attention span and concentration. No anxious or depressed appearing.     Assessment & Plan:

## 2014-01-16 NOTE — Patient Instructions (Signed)
Get the XR at [facility] and [address] (10 minutes form here); they are open 24/7 [place], [place] [address] [phone]  Take the pain medication as prescribed Call if symptoms increase or change.

## 2014-01-16 NOTE — Assessment & Plan Note (Signed)
Patient is having pain for several weeks At the right upper quadrant, he seems clinically stable. Exam is benign. Pain at the chest from previous XRT resolved. He stopped taking  pain medication, does not like to take taking Vicodin liquid because it has alcohol on it. He also has developed cough. Plan: Chest x-ray ; dx--cough Keep appointment with GI Dr. Oletta Lamas, if symptoms change or get worse he is to let me know Change Vicodin to tablets.

## 2014-01-17 ENCOUNTER — Ambulatory Visit (HOSPITAL_BASED_OUTPATIENT_CLINIC_OR_DEPARTMENT_OTHER)
Admission: RE | Admit: 2014-01-17 | Discharge: 2014-01-17 | Disposition: A | Discharge status: Home / Self Care | Payer: Medicare Other | Source: Ambulatory Visit | Attending: Internal Medicine | Admitting: Internal Medicine

## 2014-01-17 DIAGNOSIS — Z87891 Personal history of nicotine dependence: Secondary | ICD-10-CM | POA: Insufficient documentation

## 2014-01-17 DIAGNOSIS — C160 Malignant neoplasm of cardia: Secondary | ICD-10-CM

## 2014-01-17 DIAGNOSIS — R059 Cough, unspecified: Secondary | ICD-10-CM | POA: Insufficient documentation

## 2014-01-17 DIAGNOSIS — R05 Cough: Secondary | ICD-10-CM | POA: Insufficient documentation

## 2014-01-27 ENCOUNTER — Ambulatory Visit (INDEPENDENT_AMBULATORY_CARE_PROVIDER_SITE_OTHER): Payer: Medicare Other | Admitting: *Deleted

## 2014-01-27 DIAGNOSIS — I639 Cerebral infarction, unspecified: Secondary | ICD-10-CM

## 2014-01-27 DIAGNOSIS — I6350 Cerebral infarction due to unspecified occlusion or stenosis of unspecified cerebral artery: Secondary | ICD-10-CM

## 2014-01-31 NOTE — Progress Notes (Signed)
Loop recorder 

## 2014-02-03 ENCOUNTER — Encounter: Payer: Self-pay | Admitting: Oncology

## 2014-02-03 ENCOUNTER — Other Ambulatory Visit: Payer: Self-pay | Admitting: Gastroenterology

## 2014-02-03 NOTE — Addendum Note (Signed)
Addended by: Vernie Ammons. on: 02/03/2014 03:10 PM   Modules accepted: Orders

## 2014-02-05 ENCOUNTER — Ambulatory Visit (HOSPITAL_COMMUNITY)
Admission: RE | Admit: 2014-02-05 | Discharge: 2014-02-05 | Disposition: A | Discharge status: Home / Self Care | Payer: Medicare Other | Source: Ambulatory Visit | Attending: Gastroenterology | Admitting: Gastroenterology

## 2014-02-05 ENCOUNTER — Encounter (HOSPITAL_COMMUNITY): Payer: Self-pay

## 2014-02-05 ENCOUNTER — Encounter (HOSPITAL_COMMUNITY)
Admission: RE | Disposition: A | Discharge status: Home / Self Care | Payer: Self-pay | Source: Ambulatory Visit | Attending: Gastroenterology

## 2014-02-05 DIAGNOSIS — I2510 Atherosclerotic heart disease of native coronary artery without angina pectoris: Secondary | ICD-10-CM | POA: Insufficient documentation

## 2014-02-05 DIAGNOSIS — K5900 Constipation, unspecified: Secondary | ICD-10-CM | POA: Insufficient documentation

## 2014-02-05 DIAGNOSIS — Z7982 Long term (current) use of aspirin: Secondary | ICD-10-CM | POA: Insufficient documentation

## 2014-02-05 DIAGNOSIS — E785 Hyperlipidemia, unspecified: Secondary | ICD-10-CM | POA: Insufficient documentation

## 2014-02-05 DIAGNOSIS — Z8673 Personal history of transient ischemic attack (TIA), and cerebral infarction without residual deficits: Secondary | ICD-10-CM | POA: Insufficient documentation

## 2014-02-05 DIAGNOSIS — Z79899 Other long term (current) drug therapy: Secondary | ICD-10-CM | POA: Insufficient documentation

## 2014-02-05 DIAGNOSIS — C160 Malignant neoplasm of cardia: Secondary | ICD-10-CM | POA: Insufficient documentation

## 2014-02-05 DIAGNOSIS — F341 Dysthymic disorder: Secondary | ICD-10-CM | POA: Insufficient documentation

## 2014-02-05 DIAGNOSIS — Z923 Personal history of irradiation: Secondary | ICD-10-CM | POA: Insufficient documentation

## 2014-02-05 DIAGNOSIS — Z951 Presence of aortocoronary bypass graft: Secondary | ICD-10-CM | POA: Insufficient documentation

## 2014-02-05 HISTORY — PX: ESOPHAGOGASTRODUODENOSCOPY: SHX5428

## 2014-02-05 LAB — GLUCOSE, CAPILLARY: GLUCOSE-CAPILLARY: 97 mg/dL (ref 70–99)

## 2014-02-05 SURGERY — EGD (ESOPHAGOGASTRODUODENOSCOPY)
Anesthesia: Moderate Sedation

## 2014-02-05 MED ORDER — BUTAMBEN-TETRACAINE-BENZOCAINE 2-2-14 % EX AERO
INHALATION_SPRAY | CUTANEOUS | Status: DC | PRN
  Administered 2014-02-05: 2 via TOPICAL

## 2014-02-05 MED ORDER — SODIUM CHLORIDE 0.9 % IV SOLN
INTRAVENOUS | Status: DC
  Administered 2014-02-05: 500 mL via INTRAVENOUS

## 2014-02-05 MED ORDER — FENTANYL CITRATE 0.05 MG/ML IJ SOLN
INTRAMUSCULAR | Status: DC | PRN
  Administered 2014-02-05 (×2): 25 ug via INTRAVENOUS

## 2014-02-05 MED ORDER — MIDAZOLAM HCL 10 MG/2ML IJ SOLN
INTRAMUSCULAR | Status: DC | PRN
  Administered 2014-02-05 (×2): 2 mg via INTRAVENOUS

## 2014-02-05 MED ORDER — FENTANYL CITRATE 0.05 MG/ML IJ SOLN
INTRAMUSCULAR | Status: AC
  Filled 2014-02-05: qty 2

## 2014-02-05 MED ORDER — MIDAZOLAM HCL 10 MG/2ML IJ SOLN
INTRAMUSCULAR | Status: AC
  Filled 2014-02-05: qty 4

## 2014-02-05 NOTE — Discharge Instructions (Addendum)
° °  Gastrointestinal Endoscopy Care After Refer to this sheet in the next few weeks. These instructions provide you with information on caring for yourself after your procedure. Your caregiver may also give you more specific instructions. Your treatment has been planned according to current medical practices, but problems sometimes occur. Call your caregiver if you have any problems or questions after your procedure. HOME CARE INSTRUCTIONS  If you were given medicine to help you relax (sedative), do not drive, operate machinery, or sign important documents for 24 hours.  Avoid alcohol and hot or warm beverages for the first 24 hours after the procedure.  Only take over-the-counter or prescription medicines for pain, discomfort, or fever as directed by your caregiver. You may resume taking your normal medicines unless your caregiver tells you otherwise. Ask your caregiver when you may resume taking medicines that may cause bleeding, such as aspirin, clopidogrel, or warfarin.  You may return to your normal diet and activities on the day after your procedure, or as directed by your caregiver. Walking may help to reduce any bloated feeling in your abdomen.  Drink enough fluids to keep your urine clear or pale yellow.  You may gargle with salt water if you have a sore throat. SEEK IMMEDIATE MEDICAL CARE IF:  You have severe nausea or vomiting.  You have severe abdominal pain, abdominal cramps that last longer than 6 hours, or abdominal swelling (distention).  You have severe shoulder or back pain.  You have trouble swallowing.  You have shortness of breath, your breathing is shallow, or you are breathing faster than normal.  You have a fever or a rapid heartbeat.  You vomit blood or material that looks like coffee grounds.  You have bloody, black, or tarry stools. MAKE SURE YOU:  Understand these instructions.  Will watch your condition.  Will get help right away if you are not  doing well or get worse. Document Released: 03/08/2004 Document Revised: 01/24/2012 Document Reviewed: 10/25/2011 [facility] Patient Information 2015 [place], [place]. This information is not intended to replace advice given to you by your health care provider. Make sure you discuss any questions you have with your health care provider.  BEGIN OMEPRAZOLE 20 MG TWICE DAILEY

## 2014-02-05 NOTE — Op Note (Signed)
[facility] [place], [address]   ENDOSCOPY PROCEDURE REPORT  PATIENT: [name], Charles  MR#: [id number] BIRTHDATE: Oct 13, 1934 , 79  yrs. old GENDER: Male ENDOSCOPIST:Naika Noto Oletta Lamas, MD REFERRED BY:  Dr. 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 Patient has been treated with chemo and radiation and is currently being restaged. He has been on carafate liquid at his continued to have vague subsite for discomfort. This is done to reevaluate the area. He is not really had any dysphagia but has had odynophagia. MEDICATION:  fentanyl 50 mcg, versed 4 mg IV TOPICAL ANESTHETIC:    cetacaine spray  DESCRIPTION OF PROCEDURE:   Due to the patient's known history of esophageal/cardia cancer at the GE junction, pediatric endoscope was used. This was passed into the esophagus with swallowing. The esophagus was completely normal until approximately 39 cm from the teeth. At this point there was irritation, redness, and ulceration that extended to 44 cm to appear to be across the GE junction. It was quite difficult to tell exactly where the GE junction was located. The scope easily passed into the stomach and a complete exam was performed. The duodenum including the bulb and 2nd portion was normal. The antrum and body of the stomach was normal. Retroflex view the ulcerated area is seen to cross into the Cardia of the stomach. Since diagnosis was known to biopsies were taken. The scope was withdrawn in the patient tolerated procedure well.     COMPLICATIONS: None  ENDOSCOPIC IMPRESSION:  1. Gastroesophageal Junction Cancer. This does have some ulceration but does appear much smaller than in previous EGD 3/15. I suspect the ulcerations report is causing the patient's discomfort. He is currently  in carafate slurry but is not taking any acid reducing medication.  RECOMMENDATIONS:  1. Continue carafate slurry and will begin on omeprazole BID. 2. Patient will keep his appointment tomorrow with Dr. Benay Spice to discuss further diagnostic testing.    _______________________________ eSigned:  Laurence Spates, MD 02/05/2014 10:18 AM  CC: Dr. Benay Spice, Dr. Sondra Come, Dr French Ana  PATIENT NAME:  Charles, [name] MR#: [id number]

## 2014-02-05 NOTE — H&P (Signed)
Subjective:   Patient is a 79 y.o. male presents with a history of cancer at the gastroesophageal junction. He has had routine colonoscopies for some time and had Hemoccult positive stools resulting in EGD 3/15 revealing poorly differentiated adenocarcinoma the cardio right at the GE junction. He has undergone radiation and chemotherapy. He is being restaged at this time to see if he may be surgical candidate. EGD is to be repeated to. Procedure including risks and benefits discussed in office.  Patient Active Problem List   Diagnosis Date Noted  . Gastroesophageal cancer 10/28/2013  . Second degree AV block 10/25/2013  . CVA (cerebral infarction) 08/06/2013  . TIA (transient ischemic attack) 08/06/2013  . H/O: CVA (cerebrovascular accident) 08/06/2013  . Unspecified constipation 06/14/2013  . S/P SVG-RI/OM DES 05/06/13- staged DES to RCA 06/07/13 06/08/2013  . Chronic renal insufficiency, stage III (moderate) 06/08/2013  . RBBB 06/08/2013  . Angina, class III 06/07/2013  . Anxiety and depression 05/14/2013  . CAD- S/P CABG x 5 (LIMA-LAD, SVG-RI-OM1, SVG to distal RCA, SVG-PDA) 01/21/13 01/23/2013  . Left main coronary artery disease 01/16/2013  . Abnormal nuclear cardiac imaging test - Inferior Ischemia 01/07/2013  . Yellow jacket sting 04/20/2012  . Annual physical exam 02/17/2012  . Fatigue 01/13/2012  . Onychomycosis 11/29/2010  . GLAUCOMA 12/16/2009  . DIABETES MELLITUS, TYPE II 06/07/2007  . INSOMNIA-SLEEP DISORDER-UNSPEC 05/07/2007  . HYPERLIPIDEMIA 03/01/2007  . HYPERTENSION 03/01/2007   Past Medical History  Diagnosis Date  . Hypertension   . Hyperlipidemia     diet controlled, statins and zetia intolerant  . Retinopathy     mild glaucomea  . Transient ischemic attack 1999  . Histoplasmosis   . Chronic cough 08/2009    -allergy profile- june 2,2011- neg. -Max GERD rx/ off oils January 07 2010 x 3 weeks only but no benefit. - Sinus CT rec July 11,2011- refused due to  claustrophobia -MCT neg for asthma 03/01/10 -Add 1st Gen H1 July 11,2011- better  . Macular degeneration   . Coronary artery disease     CABG 01-2013, stent 04-2013    . Diabetes mellitus     no meds  . PONV (postoperative nausea and vomiting)     extreme nausea  . Heart murmur     TIA  . Adenocarcinoma of gastroesophageal junction   . Stroke 1995    tia 2 years ago  . Radiation 11/19/13-12/26/13    gastroesophageal junction area 50.4 gray    Past Surgical History  Procedure Laterality Date  . Cervical spine surgery  2002    plate  . Cholecystectomy  1985  . Percutaneous coronary stent intervention (pci-s)  101/31/2014    DrHhARDING  . Coronary artery bypass graft N/A 01/21/2013    Procedure: Coronary Arery Bypass Grafting  Times Five Using Left Internal Mammary Artery and Right Saphenous Leg Vein Harvested Endoscopically;  Surgeon: Melrose Nakayama, MD;  Location: [facility];  Service: Open Heart Surgery;  Laterality: N/A;  . Tee without cardioversion N/A 08/23/2013    Procedure: TRANSESOPHAGEAL ECHOCARDIOGRAM (TEE);  Surgeon: Sanda Klein, MD;  Location: [facility] ENDOSCOPY;  Service: Cardiovascular;  Laterality: N/A;  . Esophagogastroduodenoscopy N/A 10/17/2013    Procedure: ESOPHAGOGASTRODUODENOSCOPY (EGD);  Surgeon: Winfield Cunas., MD;  Location: [facility];  Service: Endoscopy;  Laterality: N/A;  . Appendectomy      Prescriptions prior to admission  Medication Sig Dispense Refill  . aspirin EC 81 MG tablet Take 1 tablet (81 mg total) by mouth daily.  90 tablet  3  . Cholecalciferol (VITAMIN D3) 2000 UNITS TABS Take 2,000 Units by mouth daily.      . Coenzyme Q10 (COQ10) 100 MG CAPS Take 100 mg by mouth daily.      . finasteride (PROSCAR) 5 MG tablet Take 5 mg by mouth every morning.       Marland Kitchen HYDROcodone-acetaminophen (NORCO/VICODIN) 5-325 MG per tablet Take 1-2 tablets by mouth every 8 (eight) hours as needed.  30 tablet  0  . isosorbide mononitrate (IMDUR) 60 MG 24 hr tablet Take  1 tablet (60 mg total) by mouth daily.  30 tablet  6  . lactulose, encephalopathy, (GENERLAC) 10 GM/15ML SOLN Take 15 mLs (10 g total) by mouth as needed.  2700 mL  3  . losartan (COZAAR) 50 MG tablet Take 50 mg by mouth daily.      . metoprolol tartrate (LOPRESSOR) 25 MG tablet Take 1 tablet (25 mg total) by mouth 2 (two) times daily.  180 tablet  3  . Multiple Vitamins-Minerals (EYE VITAMINS) TABS Take 1 tablet by mouth daily.      . polyethylene glycol (MIRALAX / GLYCOLAX) packet Take 17 g by mouth daily.       . sucralfate (CARAFATE) 1 GM/10ML suspension Take 10 mLs (1 g total) by mouth 4 (four) times daily -  with meals and at bedtime.  420 mL  2  . diazepam (VALIUM) 2 MG tablet Take 1 tablet (2 mg total) by mouth at bedtime as needed.  30 tablet  4  . diphenhydramine-acetaminophen (TYLENOL PM) 25-500 MG TABS Take 1 tablet by mouth at bedtime as needed (for sleep).      . nitroGLYCERIN (NITROSTAT) 0.4 MG SL tablet Place 0.4 mg under the tongue every 5 (five) minutes as needed for chest pain.      . pravastatin (PRAVACHOL) 40 MG tablet TAKE 1 TABLET BY MOUTH EVERY EVENING  90 tablet  3  . prochlorperazine (COMPAZINE) 10 MG tablet Take 1 tablet (10 mg total) by mouth every 6 (six) hours as needed for nausea.  60 tablet  1   Allergies  Allergen Reactions  . Percocet [Oxycodone-Acetaminophen] Other (See Comments)    hallucinations  . Atorvastatin Other (See Comments)    Causes myalgias   . Ezetimibe Other (See Comments)    Causes dizziness  . Lovastatin Other (See Comments)    REACTION: weakness. Patient able to tolerate pravastatin  . Ranexa [Ranolazine] Other (See Comments)    Severe constipation  . Tramadol     Nausea and Vomiting    History  Substance Use Topics  . Smoking status: Former Smoker -- 1.00 packs/day for 10 years    Types: Cigarettes    Quit date: 01/17/1977  . Smokeless tobacco: Never Used     Comment: Stopped at age 61   . Alcohol Use: No     Comment: seldom     Family History  Problem Relation Age of Onset  . Coronary artery disease Neg Hx   . Stroke Neg Hx   . Colon cancer Neg Hx   . Prostate cancer Neg Hx   . Atopy Neg Hx   . Asthma Neg Hx   . Hypertension Mother   . Hypertension Father   . Diabetes Father   . Cancer Father     colon  . Hypertension Brother   . Cancer Brother     lung  . Hypertension Sister   . Cancer Sister  lung  . Diabetes Brother   . Diabetes Sister   . Diabetes      Grandparents   . Lung cancer Brother   . Lung cancer Sister      Objective:   Patient Vitals for the past 8 hrs:  BP Temp Temp src Pulse Resp SpO2  02/05/14 0854 175/80 mmHg 98.2 F (36.8 C) Oral 72 12 100 %         See MD Preop evaluation      Assessment:   1. Cancer of gastric cardia. Patient is being restaged after chemo and radiation. He is having some increasing epigastric pain so EGD is repeated prior to further procedures  Plan:   1. Will proceed with EGD at this time. He is due to see Dr. Benay Spice back in the office tomorrow to arrange for other studies as needed.

## 2014-02-06 ENCOUNTER — Encounter (HOSPITAL_COMMUNITY): Payer: Self-pay | Admitting: Gastroenterology

## 2014-02-06 ENCOUNTER — Ambulatory Visit (HOSPITAL_BASED_OUTPATIENT_CLINIC_OR_DEPARTMENT_OTHER): Payer: Medicare Other | Admitting: Oncology

## 2014-02-06 ENCOUNTER — Encounter: Payer: Self-pay | Admitting: Internal Medicine

## 2014-02-06 ENCOUNTER — Telehealth: Payer: Self-pay | Admitting: Oncology

## 2014-02-06 ENCOUNTER — Ambulatory Visit
Admission: RE | Admit: 2014-02-06 | Discharge: 2014-02-06 | Disposition: A | Discharge status: Home / Self Care | Payer: Medicare Other | Source: Ambulatory Visit | Attending: Radiation Oncology | Admitting: Radiation Oncology

## 2014-02-06 VITALS — BP 141/71 | HR 89 | Temp 97.9°F | Resp 24 | Ht 71.0 in | Wt 164.8 lb

## 2014-02-06 VITALS — BP 123/69 | HR 76 | Temp 97.7°F | Resp 20 | Ht 71.0 in | Wt 164.2 lb

## 2014-02-06 DIAGNOSIS — I10 Essential (primary) hypertension: Secondary | ICD-10-CM

## 2014-02-06 DIAGNOSIS — C160 Malignant neoplasm of cardia: Secondary | ICD-10-CM

## 2014-02-06 DIAGNOSIS — E119 Type 2 diabetes mellitus without complications: Secondary | ICD-10-CM

## 2014-02-06 DIAGNOSIS — I2510 Atherosclerotic heart disease of native coronary artery without angina pectoris: Secondary | ICD-10-CM

## 2014-02-06 MED ORDER — SUCRALFATE 1 G PO TABS: 1.0000 g | ORAL_TABLET | Freq: Three times a day (TID) | ORAL | Status: DC

## 2014-02-06 NOTE — Telephone Encounter (Signed)
Gave pt appt for ML only July 2015

## 2014-02-06 NOTE — Progress Notes (Signed)
  [name] OFFICE PROGRESS NOTE   Diagnosis: Gastroesophageal carcinoma  INTERVAL HISTORY:   Charles [name] returns as scheduled. He continues to have pain at the mid and right abdomen. The pain usually occurs after eating. The pain lasts for up to hours. He has not eat certain foods because of pain. No other complaint. He was taken to an endoscopy Dr. Oletta Lamas on 02/05/2014. Irritation, redness, and ulceration of the esophagus was noted beginning at 39 cm and extending to 44 centimeters crossing the GE junction. The duodenum, antrum, and body of the stomach appeared normal. A rectal reflects view found the ulceration crossing into the gastric cardia. No biopsy was obtained.  Dr. Percell Miller feels the ulceration is causing his pain. He was started on omeprazole.  Objective:  Vital signs in last 24 hours:  Blood pressure 123/69, pulse 76, temperature 97.7 F (36.5 C), temperature source Oral, resp. rate 20, height 5\' 11"  (1.803 m), weight 164 lb 3.2 oz (74.481 kg).    HEENT: Neck without mass Lymphatics: No cervical, supraclavicular, or axillary nodes Resp: Lungs clear bilaterally Cardio: Regular rate and rhythm GI: No hepatomegaly, nontender, no mass Vascular: No leg edema   Medications: I have reviewed the patient's current medications.  Assessment/Plan: 1. 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 Radiation completed 12/26/2013. EGD 02/05/2014 with ulcerated mucosa at the distal esophagus/GE junction 2. History of coronary artery disease-status post coronary artery bypass surgery and placement of coronary stents  3. History of a TIA on 2 occasions  4. Remote history of histoplasmosis  5.  Diabetes  6. Hypertension  7. Arrhythmia  8. Odynophagia/mid and right upper abdominal pain-likely related to the ulceration/? Tumor at the GE junction  Disposition:  Charles [name] has persistent pain. The pain is most likely related to the distal esophageal ulceration. It is unclear whether the remaining abnormality at the GE junction is related to treatment or persistent tumor.  Charles [name] will be scheduled for a restaging PET scan and followup visit 02/17/2014.  He would like to consider surgery for the GE junction tumor. We will make a referral to Dr. Servando Snare if the PET scan shows no evidence of metastatic disease.  Betsy Coder, MD  02/06/2014  10:46 AM

## 2014-02-06 NOTE — Progress Notes (Signed)
Radiation Oncology         [phone] ________________________________  Name: Charles [name] MRN: [id number]  Date: 02/06/2014  DOB: November 29, 1934  Follow-Up Visit Note  CC: Kathlene November, MD  Ladell Pier, MD  Diagnosis:   Gastroesophageal cancer   Primary site: Esophagus - Adenocarcinoma   Staging method: AJCC 7th Edition   Clinical: (TX, N1, M0)   Summary: (TX, N1, M0)   Interval Since Last Radiation:  6  weeks  Narrative:  The patient returns today for routine follow-up.  He continues to have pain with eating certain foods. Patient did undergo endoscopy yesterday by Dr. Oletta Lamas which showed area of ulceration extending from 39-44 cm. Compared to prior endoscopy the area of ulceration seem to be much smaller. The ulceration was felt because of the patient's discomfort. Recommendations were for Carafate to continue and omeprazole.  I have given the patient a prescription in pill form  to make into a slurry see if this will reduce his out-of-pocket costs.                           ALLERGIES:  is allergic to percocet; atorvastatin; ezetimibe; lovastatin; ranexa; and tramadol.  Meds: Current Outpatient Prescriptions  Medication Sig Dispense Refill  . aspirin EC 81 MG tablet Take 1 tablet (81 mg total) by mouth daily.  90 tablet  3  . Cholecalciferol (VITAMIN D3) 2000 UNITS TABS Take 2,000 Units by mouth daily.      . Coenzyme Q10 (COQ10) 100 MG CAPS Take 100 mg by mouth daily.      . finasteride (PROSCAR) 5 MG tablet Take 5 mg by mouth every morning.       Marland Kitchen HYDROcodone-acetaminophen (NORCO/VICODIN) 5-325 MG per tablet Take 1-2 tablets by mouth every 8 (eight) hours as needed.  30 tablet  0  . isosorbide mononitrate (IMDUR) 60 MG 24 hr tablet Take 1 tablet (60 mg total) by mouth daily.  30 tablet  6  . losartan (COZAAR) 50 MG tablet Take 50 mg by mouth daily.      . metoprolol tartrate (LOPRESSOR) 25 MG tablet Take 1 tablet (25 mg total) by mouth 2 (two) times daily.  180 tablet  3    . nitroGLYCERIN (NITROSTAT) 0.4 MG SL tablet Place 0.4 mg under the tongue every 5 (five) minutes as needed for chest pain.      Marland Kitchen omeprazole (PRILOSEC) 20 MG capsule Take 20 mg by mouth 2 (two) times daily before a meal.      . polyethylene glycol (MIRALAX / GLYCOLAX) packet Take 17 g by mouth daily.       . pravastatin (PRAVACHOL) 40 MG tablet TAKE 1 TABLET BY MOUTH EVERY EVENING  90 tablet  3  . prochlorperazine (COMPAZINE) 10 MG tablet Take 1 tablet (10 mg total) by mouth every 6 (six) hours as needed for nausea.  60 tablet  1  . sucralfate (CARAFATE) 1 GM/10ML suspension Take 10 mLs (1 g total) by mouth 4 (four) times daily -  with meals and at bedtime.  420 mL  2  . diazepam (VALIUM) 2 MG tablet Take 1 tablet (2 mg total) by mouth at bedtime as needed.  30 tablet  4  . diphenhydramine-acetaminophen (TYLENOL PM) 25-500 MG TABS Take 1 tablet by mouth at bedtime as needed (for sleep).      . lactulose, encephalopathy, (GENERLAC) 10 GM/15ML SOLN Take 15 mLs (10 g total) by mouth  as needed.  2700 mL  3  . Multiple Vitamins-Minerals (EYE VITAMINS) TABS Take 1 tablet by mouth daily.      . sucralfate (CARAFATE) 1 G tablet Take 1 tablet (1 g total) by mouth 4 (four) times daily -  with meals and at bedtime.  120 tablet  2   No current facility-administered medications for this encounter.    Physical Findings: The patient is in no acute distress. Patient is alert and oriented.  height is 5\' 11"  (1.803 m) and weight is 164 lb 12.8 oz (74.753 kg). His oral temperature is 97.9 F (36.6 C). His blood pressure is 141/71 and his pulse is 89. His respiration is 24 and oxygen saturation is 100%. .  No palpable supraclavicular or axillary adenopathy. The lungs are clear to auscultation. The heart has a regular rhythm and rate. Abdomen is soft and nontender with normal bowel sounds.  Lab Findings: Lab Results  Component Value Date   WBC 2.9* 12/31/2013   HGB 11.9* 12/31/2013   HCT 36.0* 12/31/2013   MCV  90.5 12/31/2013   PLT 155 12/31/2013     Radiographic Findings: Dg Chest 2 View  01/17/2014   CLINICAL DATA:  Cough for 1 month, former smoking history, also history of carcinoma of the esophagogastric junction  EXAM: CHEST  2 VIEW  COMPARISON:  PET-CT of 11/08/2013 and chest x-ray of 02/19/2013  FINDINGS: No active infiltrate or effusion is seen. Calcified granuloma the right mid lung and calcified right hilar nodes are noted from prior granulomatous disease. The heart is borderline enlarged, and a loop recorder overlies the anterior left chest. Median sternotomy sutures are noted from prior CABG. There are degenerative changes throughout the thoracic spine.  IMPRESSION: No active cardiopulmonary disease.   Electronically Signed   By: Ivar Drape M.D.   On: 01/17/2014 10:35    Impression:  The patient is recovering from the effects of radiation.  Endoscopy report noted above.  Plan:  Medical oncology followup later today. Patient will likely be scheduled for imaging studies.  Routine followup in 3 months.  ____________________________________ Blair Promise, MD

## 2014-02-06 NOTE — Progress Notes (Signed)
Charles [name] here for follow up after treatment to his gastroesophageal junction.  He reports stabbing pain in his abdomen when he eats.  He reports that he can swallow water now without pain.  He is taking Carafate and is concerned about the cost and is wondering about the pill form.  He also started taking omeprazole yesterday.  He had an endoscopy yesterday that showed ulcerated areas.  His weight is up 2 lbs from 01/16/14.  He denies skin irritation and fatigue.  He reports an occasional cough.  He denies hemoptysis.  He reports shortness of breath with activity.  His oxygen saturation today was 100% on room air.

## 2014-02-13 ENCOUNTER — Ambulatory Visit (HOSPITAL_COMMUNITY)
Admission: RE | Admit: 2014-02-13 | Discharge: 2014-02-13 | Disposition: A | Discharge status: Home / Self Care | Payer: Medicare Other | Source: Ambulatory Visit | Attending: Oncology | Admitting: Oncology

## 2014-02-13 DIAGNOSIS — N400 Enlarged prostate without lower urinary tract symptoms: Secondary | ICD-10-CM | POA: Insufficient documentation

## 2014-02-13 DIAGNOSIS — J8410 Pulmonary fibrosis, unspecified: Secondary | ICD-10-CM | POA: Insufficient documentation

## 2014-02-13 DIAGNOSIS — I7090 Unspecified atherosclerosis: Secondary | ICD-10-CM | POA: Insufficient documentation

## 2014-02-13 DIAGNOSIS — C160 Malignant neoplasm of cardia: Secondary | ICD-10-CM

## 2014-02-13 DIAGNOSIS — Z951 Presence of aortocoronary bypass graft: Secondary | ICD-10-CM | POA: Insufficient documentation

## 2014-02-13 DIAGNOSIS — Z9221 Personal history of antineoplastic chemotherapy: Secondary | ICD-10-CM | POA: Insufficient documentation

## 2014-02-13 DIAGNOSIS — Z923 Personal history of irradiation: Secondary | ICD-10-CM | POA: Insufficient documentation

## 2014-02-13 DIAGNOSIS — J438 Other emphysema: Secondary | ICD-10-CM | POA: Insufficient documentation

## 2014-02-13 DIAGNOSIS — I889 Nonspecific lymphadenitis, unspecified: Secondary | ICD-10-CM | POA: Insufficient documentation

## 2014-02-13 LAB — GLUCOSE, CAPILLARY: Glucose-Capillary: 105 mg/dL — ABNORMAL HIGH (ref 70–99)

## 2014-02-13 MED ORDER — FLUDEOXYGLUCOSE F - 18 (FDG) INJECTION
7.3000 | Freq: Once | INTRAVENOUS | Status: AC | PRN
  Administered 2014-02-13: 7.3 via INTRAVENOUS

## 2014-02-14 ENCOUNTER — Telehealth: Payer: Self-pay | Admitting: *Deleted

## 2014-02-14 NOTE — Telephone Encounter (Signed)
Message copied by Brien Few on Fri Feb 14, 2014 10:30 AM ------      Message from: Betsy Coder B      Created: Fri Feb 14, 2014  8:30 AM       Please call patient, PET shows improvement in GE junction mass, no evidence for metastatic disease, f/u as scheduled ------

## 2014-02-14 NOTE — Telephone Encounter (Signed)
Called pt with PET results. He voiced appreciation for call.

## 2014-02-17 ENCOUNTER — Telehealth: Payer: Self-pay | Admitting: Oncology

## 2014-02-17 ENCOUNTER — Ambulatory Visit (HOSPITAL_BASED_OUTPATIENT_CLINIC_OR_DEPARTMENT_OTHER): Payer: Medicare Other | Admitting: Nurse Practitioner

## 2014-02-17 VITALS — BP 120/64 | HR 71 | Temp 98.2°F | Resp 18 | Ht 71.0 in | Wt 167.7 lb

## 2014-02-17 DIAGNOSIS — Z8673 Personal history of transient ischemic attack (TIA), and cerebral infarction without residual deficits: Secondary | ICD-10-CM

## 2014-02-17 DIAGNOSIS — C160 Malignant neoplasm of cardia: Secondary | ICD-10-CM

## 2014-02-17 DIAGNOSIS — I499 Cardiac arrhythmia, unspecified: Secondary | ICD-10-CM

## 2014-02-17 DIAGNOSIS — Z8679 Personal history of other diseases of the circulatory system: Secondary | ICD-10-CM

## 2014-02-17 DIAGNOSIS — E119 Type 2 diabetes mellitus without complications: Secondary | ICD-10-CM

## 2014-02-17 DIAGNOSIS — I10 Essential (primary) hypertension: Secondary | ICD-10-CM

## 2014-02-17 NOTE — Progress Notes (Addendum)
  [facility] OFFICE PROGRESS NOTE   Diagnosis:  Gastroesophageal carcinoma.  INTERVAL HISTORY:   [name] returns as scheduled. The pain at the mid and right abdomen has completely resolved. He is requiring no pain medication. He denies dysphagia. He is tolerating a regular diet. No nausea or vomiting. Bowels overall moving regularly.  Objective:  Vital signs in last 24 hours:  Blood pressure 120/64, pulse 71, temperature 98.2 F (36.8 C), temperature source Oral, resp. rate 18, height 5\' 11"  (1.803 m), weight 167 lb 11.2 oz (76.068 kg), SpO2 99.00%.    HEENT: No thrush or ulcerations. Resp: Lungs clear. Cardio: Regular cardiac rhythm. GI: Abdomen soft and nontender. No hepatomegaly. No mass. Vascular: No leg edema.  Skin: No rash.    Lab Results:  Lab Results  Component Value Date   WBC 2.9* 12/31/2013   HGB 11.9* 12/31/2013   HCT 36.0* 12/31/2013   MCV 90.5 12/31/2013   PLT 155 12/31/2013   NEUTROABS 1.8 12/31/2013    Imaging:  No results found.  Medications: I have reviewed the patient's current medications.  Assessment/Plan: 1. 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 Radiation completed 12/26/2013.  EGD 02/05/2014 with ulcerated mucosa at the distal esophagus/GE junction. PET scan 02/13/2014 with decreased hypermetabolic activity within the primary cancer involving the gastroesophageal junction and gastric cardia consistent with response to therapy. No residual hypermetabolic adjacent lymph nodes identified. No evidence of distant metastasis. Similar mild nonspecific hypermetabolic activity centrally within the prostate gland. 2. History of coronary artery  disease-status post coronary artery bypass surgery and placement of coronary stents  3. History of a TIA on 2 occasions  4. Remote history of histoplasmosis  5. Diabetes  6. Hypertension  7. Arrhythmia  8. Odynophagia/mid and right upper abdominal pain-likely related to the ulceration/? Tumor at the GE junction. Resolved.    Disposition: [name] appears stable. The abdominal pain he was experiencing at the time of his last visit has resolved. He is tolerating a regular diet without difficulty.  Dr. Benay Spice reviewed the recent PET scan result with [name] and his wife. There is no evidence of distant metastatic disease. He is interested in surgery for the GE junction tumor. We made a referral to Dr. Servando Snare.  He will return for a followup visit here in 3 months. He will contact the office in the interim with any problems.  Patient seen with Dr. Benay Spice.    Ned Card ANP/GNP-BC   02/17/2014  4:35 PM  This was a shared visit with Ned Card. The restaging PET scan reveals no evidence of distant disease. He would like to be considered for an esophagogastrectomy. We will refer him to Dr. Servando Snare. The odynophagia has resolved. Charles [name] will return for an office visit in 3 months.  Julieanne Manson, M.D.

## 2014-02-17 NOTE — Telephone Encounter (Signed)
gv and printed appt sched and avs for pt for OCT...Charles [name].w Cindy @ [facility] and she will talk with Dr. Servando Snare to get new appt time

## 2014-02-18 ENCOUNTER — Telehealth: Payer: Self-pay | Admitting: Oncology

## 2014-02-18 NOTE — Telephone Encounter (Signed)
7.17 @ 2:30 to see Dr. Servando Snare s.w. Pt and he is aware

## 2014-02-21 ENCOUNTER — Encounter: Payer: Self-pay | Admitting: Cardiothoracic Surgery

## 2014-02-21 ENCOUNTER — Institutional Professional Consult (permissible substitution) (INDEPENDENT_AMBULATORY_CARE_PROVIDER_SITE_OTHER): Payer: Medicare Other | Admitting: Cardiothoracic Surgery

## 2014-02-21 VITALS — BP 110/60 | HR 75 | Resp 20 | Ht 71.0 in | Wt 163.0 lb

## 2014-02-21 DIAGNOSIS — Z951 Presence of aortocoronary bypass graft: Secondary | ICD-10-CM

## 2014-02-21 DIAGNOSIS — C160 Malignant neoplasm of cardia: Secondary | ICD-10-CM

## 2014-02-23 NOTE — Progress Notes (Signed)
[address]       [place] [address]             [phone]                    Charles [name] [facility] Medical Record #[id number] Date of Birth: 1934-10-21  Referring:Dr Charles [name] Primary Care: Charles November, MD  Chief Complaint:    Chief Complaint  Patient presents with  . Esophageal Cancer    Surgical eval, PET Scan restaging 02/13/14    History of Present Illness:    Charles [name] 79 y.o. male is seen in the office  today for gastric cancer. In April of 2015 the patient noted a single tarry stool. He had no other symptoms of difficulty swallowing or painful swallowing. Dr Charles [name].  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 RESULT RATIO OF HER2: CEP 17 SIGNALS 1.15 AVERAGE HER2 COPY NUMBER PER CELL 2.35 REFERENCE RANGE NEGATIVE HER2/Chr17 Ratio <2.0 and Average HER2 copy number <4.0 EQUIVOCAL HER2/Chr17 Ratio <2.0 and Average HER2 copy number 4.0 and <6.0 POSITIVE HER2/Chr17 Ratio >=2.0 and/or Average HER2 copy number >=6.0 Charles Laws MD Pathologist, Electronic Signature ( Signed 11/01/2013) FINAL Gastroesophageal cancer   Primary site: Esophagus - Adenocarcinoma   Staging method: AJCC 7th Edition   Clinical: (TX, N1, M0)   Summary: (TX, N1, M0)  Patent has completed chemo and radiation therapy (last radiation May17) . He now present to consider surgical resection. Pre treatment PET scan suggested  Gastrohepatic node evolvement. Patient has tolerated treatment well, , wt from 172 to low 160 now back up to 165.   Current Activity/ Functional Status:  Patient is independent with mobility/ambulation, transfers, ADL's, IADL's.   Zubrod Score: At the time of surgery this patient's most  appropriate activity status/level should be described as: []    0    Normal activity, no symptoms [x]    1    Restricted in physical strenuous activity but ambulatory, able to do out light work []    2    Ambulatory and capable of self care, unable to do work activities, up and about               >50 % of waking hours                              []    3    Only limited self care, in bed greater than 50% of waking hours []    4    Completely disabled, no self care, confined to bed or chair []    5    Moribund   Past Medical History  Diagnosis Date  . Hypertension   . Hyperlipidemia     diet controlled, statins and zetia intolerant  . Retinopathy     mild glaucomea  . Transient ischemic attack 1999  . Histoplasmosis   . Chronic cough 08/2009    -allergy profile- june 2,2011- neg. -Charles [name]/ off oils January 07 2010 x 3 weeks only but no benefit. - Sinus CT rec July 11,2011- refused due to claustrophobia -MCT neg for asthma 03/01/10 -Add 1st Gen H1 July 11,2011- better  .  Macular degeneration   . Coronary artery disease     CABG 01-2013, stent 04-2013    . Diabetes mellitus     no meds  . PONV (postoperative nausea and vomiting)     extreme nausea  . Heart murmur     TIA  . Adenocarcinoma of gastroesophageal junction   . Stroke 1995    tia 2 years ago  . Radiation 11/19/13-12/26/13    gastroesophageal junction area 50.4 gray    Past Surgical History  Procedure Laterality Date  . Cervical spine surgery  2002    plate  . Cholecystectomy  1985  . Percutaneous coronary stent intervention (pci-s)  101/31/2014    Charles [name]  . Coronary artery bypass graft N/A 01/21/2013    Procedure: Coronary Arery Bypass Grafting  Times Five Using Left Internal Mammary Artery and Right Saphenous Leg Vein Harvested Endoscopically;  Surgeon: Charles Nakayama, MD;  Location: [facility];  Service: Open Heart Surgery;  Laterality: N/A;  . Tee without cardioversion N/A 08/23/2013    Procedure: TRANSESOPHAGEAL  ECHOCARDIOGRAM (TEE);  Surgeon: Charles Klein, MD;  Location: [facility] ENDOSCOPY;  Service: Cardiovascular;  Laterality: N/A;  . Esophagogastroduodenoscopy N/A 10/17/2013    Procedure: ESOPHAGOGASTRODUODENOSCOPY (EGD);  Surgeon: Charles Cunas., MD;  Location: [facility] ENDOSCOPY;  Service: Endoscopy;  Laterality: N/A;  . Appendectomy    . Esophagogastroduodenoscopy N/A 02/05/2014    Procedure: ESOPHAGOGASTRODUODENOSCOPY (EGD);  Surgeon: Charles Cunas., MD;  Location: [facility] ENDOSCOPY;  Service: Endoscopy;  Laterality: N/A;    Family History  Problem Relation Age of Onset  . Coronary artery disease Neg Hx   . Stroke Neg Hx   . Colon cancer Neg Hx   . Prostate cancer Neg Hx   . Atopy Neg Hx   . Asthma Neg Hx   . Hypertension Mother   . Hypertension Father   . Diabetes Father   . Cancer Father     colon  . Hypertension Brother   . Cancer Brother     lung  . Hypertension Sister   . Cancer Sister     lung  . Diabetes Brother   . Diabetes Sister   . Diabetes      Grandparents   . Lung cancer Brother   . Lung cancer Sister     History   Social History  . Marital Status: Married    Spouse Name: Charles [name]    Number of Children: 2  . Years of Education: college   Occupational History  . reitred    Social History Main Topics  . Smoking status: Former Smoker -- 1.00 packs/day for 10 years    Types: Cigarettes    Quit date: 01/17/1977  . Smokeless tobacco: Never Used     Comment: Stopped at age 81   . Alcohol Use: No     Comment: seldom  . Drug Use: No  . Sexual Activity: No   Other Topics Concern  . 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 Former Smoker -- 1.00 packs/day for 10 years  . Types: Cigarettes  . Quit date: 01/17/1977  Smokeless tobacco  . Never Used    Comment: Stopped at age  66     History  Alcohol Use No  Comment: seldom     Allergies  Allergen Reactions  . Percocet [Oxycodone-Acetaminophen] Other (See Comments)    hallucinations  . Atorvastatin Other (See Comments)    Causes myalgias   . Ezetimibe Other (See Comments)    Causes dizziness  . Lovastatin Other (See Comments)    REACTION: weakness. Patient able to tolerate pravastatin  . Ranexa [Ranolazine] Other (See Comments)    Severe constipation  . Tramadol     Nausea and Vomiting    Current Outpatient Prescriptions  Medication Sig Dispense Refill  . aspirin EC 81 MG tablet Take 1 tablet (81 mg total) by mouth daily.  90 tablet  3  . Cholecalciferol (VITAMIN D3) 2000 UNITS TABS Take 2,000 Units by mouth daily.      . Coenzyme Q10 (COQ10) 100 MG CAPS Take 100 mg by mouth daily.      . diazepam (VALIUM) 2 MG tablet Take 1 tablet (2 mg total) by mouth at bedtime as needed.  30 tablet  4  . finasteride (PROSCAR) 5 MG tablet Take 5 mg by mouth every morning.       Marland Kitchen HYDROcodone-acetaminophen (NORCO/VICODIN) 5-325 MG per tablet Take 1-2 tablets by mouth every 8 (eight) hours as needed.  30 tablet  0  . isosorbide mononitrate (IMDUR) 60 MG 24 hr tablet Take 1 tablet (60 mg total) by mouth daily.  30 tablet  6  . lactulose, encephalopathy, (GENERLAC) 10 GM/15ML SOLN Take 15 mLs (10 g total) by mouth as needed.  2700 mL  3  . losartan (COZAAR) 50 MG tablet Take 50 mg by mouth daily.      . metoprolol tartrate (LOPRESSOR) 25 MG tablet Take 1 tablet (25 mg total) by mouth 2 (two) times daily.  180 tablet  3  . Multiple Vitamins-Minerals (EYE VITAMINS) TABS Take 1 tablet by mouth daily.      . nitroGLYCERIN (NITROSTAT) 0.4 MG SL tablet Place 0.4 mg under the tongue every 5 (five) minutes as needed for chest pain.      Marland Kitchen omeprazole (PRILOSEC) 20 MG capsule Take 20 mg by mouth 2 (two) times daily before a meal.      . polyethylene glycol (MIRALAX / GLYCOLAX) packet Take 17 g by mouth daily.       .  pravastatin (PRAVACHOL) 40 MG tablet TAKE 1 TABLET BY MOUTH EVERY EVENING  90 tablet  3  . prochlorperazine (COMPAZINE) 10 MG tablet Take 1 tablet (10 mg total) by mouth every 6 (six) hours as needed for nausea.  60 tablet  1  . sucralfate (CARAFATE) 1 G tablet Take 1 tablet (1 g total) by mouth 4 (four) times daily -  with meals and at bedtime.  120 tablet  2   No current facility-administered medications for this visit.     Review of Systems:     Cardiac Review of Systems: Y or N  Chest Pain [ n   ]  Resting SOB [ n] Exertional SOB  [ n  Orthopnea [n  ]   Pedal Edema [ n  ]    Palpitationsn ] Syncope  [n ]   Presyncope n  ]  General Review of Systems: [Y] = yes [  ]=no Constitional: recent weight change [  ];  Wt loss over the last 3 months [   ] anorexia [  ]; fatigue [  ]; nausea [  ]; night sweats [  ]; fever [  ]; or chills [  ];  Dental: poor dentition[  ]; Last Dentist visit:   Eye : blurred vision [  ]; diplopia [   ]; vision changes [  ];  Amaurosis fugax[  ]; Resp: cough [  ];  wheezing[  ];  hemoptysis[  ]; shortness of breath[  ]; paroxysmal nocturnal dyspnea[  ]; dyspnea on exertion[  ]; or orthopnea[  ];  GI:  gallstones[  ], vomiting[  ];  dysphagia[  ]; melena[  ];  hematochezia [  ]; heartburn[  ];   Hx of  Colonoscopy[  ]; GU: kidney stones [  ]; hematuria[  ];   dysuria [  ];  nocturia[  ];  history of     obstruction [  ]; urinary frequency Blue.Reese  ]             Skin: rash, swelling[  ];, hair loss[  ];  peripheral edema[  ];  or itching[  ]; Musculosketetal: myalgias[  ];  joint swelling[  ];  joint erythema[  ];  joint pain[  ];  back pain[  ];  Heme/Lymph: bruising[  ];  bleeding[  ];  anemia[  ];  Neuro: TIA[ yes 07/30/2013 ];  headaches[  ];  stroke[  ];  vertigo[  ];  seizures[  ];   paresthesias[  ];  difficulty walking[  ];  Psych:depression[  ]; anxiety[  ];  Endocrine: diabetes[ y ];  thyroid dysfunction[ n ];  Immunizations: Flu up to date [?  ];  Pneumococcal up to date [?  ];  Other:  Physical Exam: BP 110/60  Pulse 75  Resp 20  Ht 5' 11" (1.803 m)  Wt 163 lb (73.936 kg)  BMI 22.74 kg/m2  SpO2 98%  PHYSICAL EXAMINATION:  General appearance: alert, cooperative and appears stated age Neurologic: intact Heart: regular rate and rhythm, S1, S2 normal, no murmur, click, rub or gallop Lungs: clear to auscultation bilaterally and normal percussion bilaterally Abdomen: soft, non-tender; bowel sounds normal; no masses,  no organomegaly Extremities: extremities normal, atraumatic, no cyanosis or edema no cervical , supraclavicular or axillary nodes   Diagnostic Studies & Laboratory data:     Recent Radiology Findings:   Nm Pet Image Restag (ps) Skull Base To Thigh  02/13/2014   CLINICAL DATA:  Subsequent treatment strategy for gastroesophageal cancer. Radiation therapy completed 12/26/2013. Undergoing chemotherapy.  EXAM: NUCLEAR MEDICINE PET SKULL BASE TO THIGH  TECHNIQUE: 7.3 mCi F-18 FDG was injected intravenously. Full-ring PET imaging was performed from the skull base to thigh after the radiotracer. CT data was obtained and used for attenuation correction and anatomic localization.  FASTING BLOOD GLUCOSE:  Value: 105 mg/dl  COMPARISON:  PET-CT 11/08/2013. CTs of the chest CT, abdomen and pelvis 10/18/2013.  FINDINGS: NECK  No hypermetabolic cervical lymph nodes are identified.There are no lesions of the pharyngeal mucosal space.  CHEST  There are no hypermetabolic mediastinal, hilar or axillary lymph nodes. There is no hypermetabolic pulmonary activity. Calcified right hilar lymph nodes and a calcified right upper lobe granuloma are again noted. Emphysema and extensive atherosclerosis are noted status post CABG.  ABDOMEN/PELVIS  The degree of hypermetabolic activity at the gastroesophageal junction and extending into the gastric [name] has decreased compared with the prior study. This currently has an SUV Charles of 14.3 (previously 44.6).  No residual hypermetabolic surrounding lymph nodes are identified. There is no abnormal hypermetabolic activity within the liver, adrenal glands, spleen or pancreas. No hypermetabolic abdominal pelvic lymph nodes are seen. The prostate  gland is mildly enlarged and demonstrates mild nonspecific central hypermetabolic activity (SUV Charles 5.6). This appears similar to the prior study.  SKELETON  There is no hypermetabolic activity to suggest osseous metastatic disease. There is decreased activity within the spine near the thoracolumbar junction attributed to interval radiation therapy.  IMPRESSION: 1. There is decreased hypermetabolic activity within the primary cancer involving the gastroesophageal junction and gastric [name] consistent with response to therapy. No residual hypermetabolic adjacent lymph nodes are identified. 2. No evidence of distant metastases. 3. Mild nonspecific hypermetabolic activity centrally within the prostate gland.   Electronically Signed   By: Camie Patience M.D.   On: 02/13/2014 09:52  Nm Pet Image Restag (ps) Skull Base To Thigh CLINICAL DATA: Initial treatment strategy for invasive poorly  differentiated adenocarcinoma of the esophagogastric junction.  EXAM:  NUCLEAR MEDICINE PET SKULL BASE TO THIGH  TECHNIQUE:  9.6 mCi F-18 FDG was injected intravenously. Full-ring PET imaging  was performed from the skull base to thigh after the radiotracer. CT  data was obtained and used for attenuation correction and anatomic  localization.  FASTING BLOOD GLUCOSE: Value: 111 mg/dl  COMPARISON: CT ABD W/CM dated 10/18/2013  FINDINGS:  NECK  No hypermetabolic lymph nodes in the neck.  CHEST  No hypermetabolic mediastinal or hilar nodes. There is mild  hypermetabolic uptake in the distal esophagus, leading into the  esophagogastric junction.  No suspicious pulmonary nodules on the CT scan. Calcified lymph  nodes are seen in the right hilum with calcified right upper lobe  granuloma  noted. There is some dependent atelectasis in the lung  bases with a posterior right lower lobe tiny calcified granuloma  noted.  ABDOMEN/PELVIS  Wall thickening is identified in the distal esophagus and  esophagogastric junction. Esophagogastric junction is markedly  hypermetabolic, consistent with the patient's known adenocarcinoma.  The gastrohepatic ligament lymph node which was seen on the recent  CT scan is also hypermetabolic consistent with metastatic disease.  No abnormal hypermetabolic activity within the liver, pancreas,  adrenal glands, or spleen.  SKELETON  No focal hypermetabolic activity to suggest skeletal metastasis.  IMPRESSION:  Markedly hypermetabolic lesion at the esophagogastric junction,  consistent with the patient's history of known primary malignancy.  There is low level uptake in the distal esophagus proximal to the  dominant lesion and hypermetabolic activity is identified in the  previously described gastrohepatic ligament lymph node, consistent  with metastatic involvement.  No evidence for distant metastases in the neck, chest, or pelvis.  Electronically Signed  By: Misty Stanley M.D.  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 Now s/p radiation and chemo.  Consider surgical resection will ask general  surgery to  see as poss resection from abdomen only,  Needs Esophageal ultrasound for staging  Extensive cardiac history, CABG and subsequent stent placement - stable currently by symptoms  Will ask cardiology to provide risk assessment from cardiac disease standpoint  I will see patient back after above completed to make final decision      I spent 60 minutes counseling the patient face to face. The total time spent in the appointment was 80 minutes.  Grace Isaac MD      [place] [place] [address] Office [phone]   Beeper [phone]  02/23/2014 7:25 PM

## 2014-02-25 ENCOUNTER — Encounter: Payer: Self-pay | Admitting: Cardiovascular Disease

## 2014-02-26 ENCOUNTER — Encounter (HOSPITAL_COMMUNITY): Payer: Self-pay | Admitting: *Deleted

## 2014-02-26 ENCOUNTER — Ambulatory Visit (INDEPENDENT_AMBULATORY_CARE_PROVIDER_SITE_OTHER): Payer: Medicare Other | Admitting: *Deleted

## 2014-02-26 ENCOUNTER — Encounter (HOSPITAL_COMMUNITY): Payer: Self-pay | Admitting: Pharmacy Technician

## 2014-02-26 ENCOUNTER — Telehealth: Payer: Self-pay | Admitting: *Deleted

## 2014-02-26 DIAGNOSIS — Z01818 Encounter for other preprocedural examination: Secondary | ICD-10-CM

## 2014-02-26 DIAGNOSIS — I639 Cerebral infarction, unspecified: Secondary | ICD-10-CM

## 2014-02-26 DIAGNOSIS — I2510 Atherosclerotic heart disease of native coronary artery without angina pectoris: Secondary | ICD-10-CM

## 2014-02-26 DIAGNOSIS — I6350 Cerebral infarction due to unspecified occlusion or stenosis of unspecified cerebral artery: Secondary | ICD-10-CM

## 2014-02-26 NOTE — Telephone Encounter (Signed)
Informed patient he needs a Lexiscan Myoview for cardiac clearance for Dr. Servando Snare and Dr. Hassell Done.  Has an appt w/Bryan Samara Snide, [place] 03/17/14 - test needs to be done prior to this date.

## 2014-02-27 ENCOUNTER — Other Ambulatory Visit: Payer: Self-pay | Admitting: Gastroenterology

## 2014-02-27 LAB — MDC_IDC_ENUM_SESS_TYPE_REMOTE

## 2014-02-28 NOTE — Addendum Note (Signed)
Addended by: Arta Silence on: 02/28/2014 08:30 AM   Modules accepted: Orders

## 2014-03-03 ENCOUNTER — Encounter: Payer: Self-pay | Admitting: Physician Assistant

## 2014-03-03 ENCOUNTER — Ambulatory Visit (INDEPENDENT_AMBULATORY_CARE_PROVIDER_SITE_OTHER): Payer: Medicare Other | Admitting: Physician Assistant

## 2014-03-03 VITALS — BP 120/58 | HR 97 | Temp 98.4°F | Resp 16 | Ht 71.0 in | Wt 172.2 lb

## 2014-03-03 DIAGNOSIS — G459 Transient cerebral ischemic attack, unspecified: Secondary | ICD-10-CM

## 2014-03-03 DIAGNOSIS — L259 Unspecified contact dermatitis, unspecified cause: Secondary | ICD-10-CM

## 2014-03-03 MED ORDER — TRIAMCINOLONE ACETONIDE 0.5 % EX OINT: 1.0000 "application " | TOPICAL_OINTMENT | Freq: Two times a day (BID) | CUTANEOUS | Status: DC

## 2014-03-03 MED ORDER — GLUCOSE BLOOD VI STRP: ORAL_STRIP | Status: AC

## 2014-03-03 MED ORDER — PREDNISONE 20 MG PO TABS: ORAL_TABLET | ORAL | Status: DC

## 2014-03-03 NOTE — Progress Notes (Signed)
Patient presents to clinic today c/o pruritic rash of bilateral hands, wrists and forearms.  Rash has been present for 5 days.  Endorses working out in the yard prior to symptom onset.  Denies rash elsewhere.    Past Medical History  Diagnosis Date  . Hypertension   . Hyperlipidemia     diet controlled, statins and zetia intolerant  . Retinopathy     mild glaucomea  . Transient ischemic attack 1999  . Histoplasmosis   . Chronic cough 08/2009    -allergy profile- june 2,2011- neg. -Max GERD rx/ off oils January 07 2010 x 3 weeks only but no benefit. - Sinus CT rec July 11,2011- refused due to claustrophobia -MCT neg for asthma 03/01/10 -Add 1st Gen H1 July 11,2011- better  . Macular degeneration   . Coronary artery disease     CABG 01-2013, stent 04-2013    . Heart murmur     TIA  . Adenocarcinoma of gastroesophageal junction   . Stroke 1995    tia 2 years ago  . Radiation 11/19/13-12/26/13    gastroesophageal junction area 50.4 gray  . Diabetes mellitus     no meds- controls with diet  . PONV (postoperative nausea and vomiting)     extreme nausea    Current Facility-Administered Medications on File Prior to Visit  Medication Dose Route Frequency Provider Last Rate Last Dose  . 0.9 %  sodium chloride infusion   Intravenous Continuous Arta Silence, MD      . lactated ringers infusion   Intravenous Continuous Arta Silence, MD 20 mL/hr at 03/05/14 1210 1,000 mL at 03/05/14 1210   Current Outpatient Prescriptions on File Prior to Visit  Medication Sig Dispense Refill  . aspirin EC 81 MG tablet Take 81 mg by mouth daily.      . Cholecalciferol (VITAMIN D3) 2000 UNITS TABS Take 2,000 Units by mouth daily.      . Coenzyme Q10 (COQ10) 100 MG CAPS Take 100 mg by mouth daily.      . finasteride (PROSCAR) 5 MG tablet Take 5 mg by mouth every morning.       . isosorbide mononitrate (IMDUR) 60 MG 24 hr tablet Take 60 mg by mouth every morning.      Marland Kitchen losartan (COZAAR) 50 MG tablet Take 50 mg by  mouth every morning.       . metoprolol tartrate (LOPRESSOR) 25 MG tablet Take 25 mg by mouth 2 (two) times daily.      . nitroGLYCERIN (NITROSTAT) 0.4 MG SL tablet Place 0.4 mg under the tongue every 5 (five) minutes as needed for chest pain.      Marland Kitchen omeprazole (PRILOSEC) 20 MG capsule Take 20 mg by mouth 2 (two) times daily before a meal.      . pravastatin (PRAVACHOL) 40 MG tablet Take 40 mg by mouth daily.        Allergies  Allergen Reactions  . Percocet [Oxycodone-Acetaminophen] Other (See Comments)    hallucinations  . Atorvastatin Other (See Comments)    Causes myalgias   . Ezetimibe Other (See Comments)    Causes dizziness  . Lovastatin Other (See Comments)    REACTION: weakness. Patient able to tolerate pravastatin  . Ranexa [Ranolazine] Other (See Comments)    Severe constipation  . Tramadol     Nausea and Vomiting    Family History  Problem Relation Age of Onset  . Coronary artery disease Neg Hx   . Stroke Neg Hx   .  Colon cancer Neg Hx   . Prostate cancer Neg Hx   . Atopy Neg Hx   . Asthma Neg Hx   . Hypertension Mother   . Hypertension Father   . Diabetes Father   . Cancer Father     colon  . Hypertension Brother   . Cancer Brother     lung  . Hypertension Sister   . Cancer Sister     lung  . Diabetes Brother   . Diabetes Sister   . Diabetes      Grandparents   . Lung cancer Brother   . Lung cancer Sister     History   Social History  . Marital Status: Married    Spouse Name: [name]    Number of Children: 2  . Years of Education: college   Occupational History  . reitred    Social History Main Topics  . Smoking status: Former Smoker -- 1.00 packs/day for 10 years    Types: Cigarettes    Quit date: 01/17/1977  . Smokeless tobacco: Never Used     Comment: Stopped at age 24   . Alcohol Use: No     Comment: seldom  . Drug Use: No  . Sexual Activity: No   Other Topics Concern  . 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  All other ROS are negative.  BP 120/58  Pulse 97  Temp(Src) 98.4 F (36.9 C) (Oral)  Resp 16  Ht 5\' 11"  (1.803 m)  Wt 172 lb 4 oz (78.132 kg)  BMI 24.03 kg/m2  SpO2 97%  Physical Exam  Vitals reviewed. Constitutional: He is well-developed, well-nourished, and in no distress.  HENT:  Head: Normocephalic and atraumatic.  Cardiovascular: Normal rate, regular rhythm, normal heart sounds and intact distal pulses.   Pulmonary/Chest: Effort normal and breath sounds normal. No respiratory distress. He has no wheezes. He has no rales. He exhibits no tenderness.  Skin: Skin is warm and dry.  Presence of a maculopapular rash in linear distributions, noted on bilateral forearms, upper arms and posterior neck.  No rash noted elsewhere.  Psychiatric: Affect normal.    Recent Results (from the past 2160 hour(s))  CBC WITH DIFFERENTIAL     Status: Abnormal   Collection Time    12/12/13 10:14 AM      Result Value Ref Range   WBC 3.3 (*) 4.0 - 10.3 10e3/uL   NEUT# 2.7  1.5 - 6.5 10e3/uL   HGB 14.3  13.0 - 17.1 g/dL   HCT 43.3  38.4 - 49.9 %   Platelets 151  140 - 400 10e3/uL   MCV 91.9  79.3 - 98.0 fL   MCH 30.4  27.2 - 33.4 pg   MCHC 33.0  32.0 - 36.0 g/dL   RBC 4.71  4.20 - 5.82 10e6/uL   RDW 13.9  11.0 - 14.6 %   lymph# 0.3 (*) 0.9 - 3.3 10e3/uL   MONO# 0.2  0.1 - 0.9 10e3/uL   Eosinophils Absolute 0.0  0.0 - 0.5 10e3/uL   Basophils Absolute 0.0  0.0 - 0.1 10e3/uL   NEUT% 81.5 (*) 39.0 - 75.0 %   LYMPH% 9.4 (*) 14.0 - 49.0 %   MONO% 7.3  0.0 - 14.0 %  EOS% 0.9  0.0 - 7.0 %   BASO% 0.9  0.0 - 2.0 %  COMPREHENSIVE METABOLIC PANEL (ZY60)     Status: None   Collection Time    12/12/13 10:15 AM      Result Value Ref Range   Sodium 143  136 - 145 mEq/L   Potassium 4.3  3.5 - 5.1 mEq/L   Chloride 109  98 - 109 mEq/L    CO2 25  22 - 29 mEq/L   Glucose 136  70 - 140 mg/dl   BUN 25.6  7.0 - 26.0 mg/dL   Creatinine 1.1  0.7 - 1.3 mg/dL   Total Bilirubin 0.82  0.20 - 1.20 mg/dL   Alkaline Phosphatase 66  40 - 150 U/L   AST 16  5 - 34 U/L   ALT 13  0 - 55 U/L   Total Protein 7.0  6.4 - 8.3 g/dL   Albumin 3.7  3.5 - 5.0 g/dL   Calcium 9.8  8.4 - 10.4 mg/dL   Anion Gap 8  3 - 11 mEq/L  CBC WITH DIFFERENTIAL     Status: Abnormal   Collection Time    12/19/13  8:38 AM      Result Value Ref Range   WBC 3.3 (*) 4.0 - 10.3 10e3/uL   NEUT# 2.4  1.5 - 6.5 10e3/uL   HGB 14.6  13.0 - 17.1 g/dL   HCT 43.7  38.4 - 49.9 %   Platelets 146  140 - 400 10e3/uL   MCV 91.4  79.3 - 98.0 fL   MCH 30.5  27.2 - 33.4 pg   MCHC 33.4  32.0 - 36.0 g/dL   RBC 4.78  4.20 - 5.82 10e6/uL   RDW 13.7  11.0 - 14.6 %   lymph# 0.4 (*) 0.9 - 3.3 10e3/uL   MONO# 0.4  0.1 - 0.9 10e3/uL   Eosinophils Absolute 0.0  0.0 - 0.5 10e3/uL   Basophils Absolute 0.1  0.0 - 0.1 10e3/uL   NEUT% 74.2  39.0 - 75.0 %   LYMPH% 12.5 (*) 14.0 - 49.0 %   MONO% 10.6  0.0 - 14.0 %   EOS% 1.2  0.0 - 7.0 %   BASO% 1.5  0.0 - 2.0 %   nRBC 0  0 - 0 %  CBC WITH DIFFERENTIAL     Status: Abnormal   Collection Time    12/24/13  9:54 AM      Result Value Ref Range   WBC 2.5 (*) 4.0 - 10.3 10e3/uL   NEUT# 2.0  1.5 - 6.5 10e3/uL   HGB 13.5  13.0 - 17.1 g/dL   HCT 40.6  38.4 - 49.9 %   Platelets 129 (*) 140 - 400 10e3/uL   MCV 90.4  79.3 - 98.0 fL   MCH 30.1  27.2 - 33.4 pg   MCHC 33.3  32.0 - 36.0 g/dL   RBC 4.49  4.20 - 5.82 10e6/uL   RDW 13.7  11.0 - 14.6 %   lymph# 0.3 (*) 0.9 - 3.3 10e3/uL   MONO# 0.2  0.1 - 0.9 10e3/uL   Eosinophils Absolute 0.0  0.0 - 0.5 10e3/uL   Basophils Absolute 0.0  0.0 - 0.1 10e3/uL   NEUT% 80.3 (*) 39.0 - 75.0 %   LYMPH% 9.8 (*) 14.0 - 49.0 %   MONO% 7.9  0.0 - 14.0 %   EOS% 0.8  0.0 - 7.0 %   BASO% 1.2  0.0 - 2.0 %  BASIC METABOLIC PANEL (YK99)     Status: Abnormal   Collection Time    12/24/13  9:54 AM       Result Value Ref Range   Sodium 143  136 - 145 mEq/L   Potassium 4.4  3.5 - 5.1 mEq/L   Chloride 109  98 - 109 mEq/L   CO2 24  22 - 29 mEq/L   Glucose 142 (*) 70 - 140 mg/dl   BUN 30.3 (*) 7.0 - 26.0 mg/dL   Creatinine 1.3  0.7 - 1.3 mg/dL   Calcium 9.7  8.4 - 10.4 mg/dL   Anion Gap 11  3 - 11 mEq/L  CBC WITH DIFFERENTIAL     Status: Abnormal   Collection Time    12/31/13  2:40 PM      Result Value Ref Range   WBC 2.9 (*) 4.0 - 10.3 10e3/uL   NEUT# 1.8  1.5 - 6.5 10e3/uL   HGB 11.9 (*) 13.0 - 17.1 g/dL   HCT 36.0 (*) 38.4 - 49.9 %   Platelets 155  140 - 400 10e3/uL   MCV 90.5  79.3 - 98.0 fL   MCH 30.0  27.2 - 33.4 pg   MCHC 33.2  32.0 - 36.0 g/dL   RBC 3.98 (*) 4.20 - 5.82 10e6/uL   RDW 14.4  11.0 - 14.6 %   lymph# 0.3 (*) 0.9 - 3.3 10e3/uL   MONO# 0.8  0.1 - 0.9 10e3/uL   Eosinophils Absolute 0.0  0.0 - 0.5 10e3/uL   Basophils Absolute 0.0  0.0 - 0.1 10e3/uL   NEUT% 62.1  39.0 - 75.0 %   LYMPH% 9.9 (*) 14.0 - 49.0 %   MONO% 26.9 (*) 0.0 - 14.0 %   EOS% 0.3  0.0 - 7.0 %   BASO% 0.8  0.0 - 2.0 %  COMPREHENSIVE METABOLIC PANEL (IP38)     Status: Abnormal   Collection Time    12/31/13  2:40 PM      Result Value Ref Range   Sodium 142  136 - 145 mEq/L   Potassium 3.6  3.5 - 5.1 mEq/L   Chloride 108  98 - 109 mEq/L   CO2 23  22 - 29 mEq/L   Glucose 130  70 - 140 mg/dl   BUN 28.8 (*) 7.0 - 26.0 mg/dL   Creatinine 1.3  0.7 - 1.3 mg/dL   Total Bilirubin 0.83  0.20 - 1.20 mg/dL   Alkaline Phosphatase 59  40 - 150 U/L   AST 30  5 - 34 U/L   ALT 22  0 - 55 U/L   Total Protein 6.4  6.4 - 8.3 g/dL   Albumin 3.4 (*) 3.5 - 5.0 g/dL   Calcium 9.7  8.4 - 10.4 mg/dL   Anion Gap 11  3 - 11 mEq/L  PACEMAKER DEVICE OBSERVATION     Status: None   Collection Time    01/09/14  4:40 PM      Result Value Ref Range   PACEART PDF       DEVICE MODEL PM       BATTERY VOLTAGE      MDC_IDC_ENUM_SESS_TYPE_INCLINIC     Status: None   Collection Time    01/09/14  7:36 PM      Result Value  Ref Range   Date Time Interrogation Session [id number]     Pulse Generator Manufacturer Medtronic     Pulse Gen Model [id number] Reveal LINQ     Pulse  Gen Serial Number [id number]     Zone Setting Type Category VF     Zone Setting Type Category VT     Zone Setting Type Category VENTRICULAR_TACHYCARDIA_1     Zone Detect Interval 390     Zone Setting Type Category VENTRICULAR_TACHYCARDIA_2     Zone Setting Type Category ATRIAL_FIBRILLATION     Zone Setting Type Category ATAF     Zone Setting Type Category ASYSTOLE     Zone Detect Interval 3000     Zone Setting Type Category BRADYCARDIA     Zone Detect Interval 2000     Battery Status OK     Eval Rhythm NSR     Miscellaneous Comment       Value: Linq check in clinic.  Pt with 4 tachy episodes---max dur. 3 mins 38 sec, Max 200, Max Avg V 200---(2) SVT on 5-27 x >5 mins total and (2) noise; 0 brady episodes; 0 asystole. Plan to continue [facility] F/U QMO and F/U with [facility] in 12 months.  GLUCOSE, CAPILLARY     Status: None   Collection Time    02/05/14  9:11 AM      Result Value Ref Range   Glucose-Capillary 97  70 - 99 mg/dL  GLUCOSE, CAPILLARY     Status: Abnormal   Collection Time    02/13/14  6:55 AM      Result Value Ref Range   Glucose-Capillary 105 (*) 70 - 99 mg/dL  MDC_IDC_ENUM_SESS_TYPE_REMOTE     Status: None   Collection Time    02/27/14  3:50 PM      Result Value Ref Range   Pulse Generator Manufacturer Medtronic     Pulse Gen Model [id number] Reveal LINQ     Pulse Gen Serial Number [id number]     Eval Rhythm SR     Miscellaneous Comment       Value: [facility] summary report received. Battery status OK. Normal device function. 4 tachy episodes--previously reviewed on last check 01-09-14. Monthly summary reports and ROV in September with [facility].  GLUCOSE, CAPILLARY     Status: Abnormal   Collection Time    03/05/14 12:03 PM      Result Value Ref Range   Glucose-Capillary 103 (*) 70 - 99 mg/dL    Assessment/Plan: Contact  dermatitis Likely due to Rhus species of plant.  Rx steroid taper.  Rx Triamcinolone ointment.  Cool compresses.  Return precautions discussed with patient.

## 2014-03-03 NOTE — Progress Notes (Signed)
Pre visit review using our clinic review tool, if applicable. No additional management support is needed unless otherwise documented below in the visit note. 

## 2014-03-03 NOTE — Patient Instructions (Signed)
Please take prednisone as directed.  Avoid heat or excess sun exposure.  Use Triamcinolone as directed.  Monitor blood sugar.  If glucose gets above 250, stop prednisone and call the office.  Monitor carb intake while on steroid.  Call or return to clinic if symptoms are not improving.

## 2014-03-03 NOTE — Progress Notes (Signed)
Loop recorder 

## 2014-03-04 ENCOUNTER — Telehealth (HOSPITAL_COMMUNITY): Payer: Self-pay

## 2014-03-04 NOTE — Telephone Encounter (Signed)
Encounter complete. 

## 2014-03-05 ENCOUNTER — Ambulatory Visit (HOSPITAL_COMMUNITY): Payer: Medicare Other | Admitting: Anesthesiology

## 2014-03-05 ENCOUNTER — Ambulatory Visit (HOSPITAL_COMMUNITY)
Admission: RE | Admit: 2014-03-05 | Discharge: 2014-03-05 | Disposition: A | Discharge status: Home / Self Care | Payer: Medicare Other | Source: Ambulatory Visit | Attending: Gastroenterology | Admitting: Gastroenterology

## 2014-03-05 ENCOUNTER — Encounter (HOSPITAL_COMMUNITY): Payer: Self-pay

## 2014-03-05 ENCOUNTER — Encounter (HOSPITAL_COMMUNITY)
Admission: RE | Disposition: A | Discharge status: Home / Self Care | Payer: Self-pay | Source: Ambulatory Visit | Attending: Gastroenterology

## 2014-03-05 ENCOUNTER — Encounter (HOSPITAL_COMMUNITY): Payer: Medicare Other | Admitting: Anesthesiology

## 2014-03-05 DIAGNOSIS — C160 Malignant neoplasm of cardia: Secondary | ICD-10-CM | POA: Insufficient documentation

## 2014-03-05 DIAGNOSIS — Z87891 Personal history of nicotine dependence: Secondary | ICD-10-CM | POA: Insufficient documentation

## 2014-03-05 DIAGNOSIS — Z951 Presence of aortocoronary bypass graft: Secondary | ICD-10-CM | POA: Diagnosis not present

## 2014-03-05 DIAGNOSIS — I2510 Atherosclerotic heart disease of native coronary artery without angina pectoris: Secondary | ICD-10-CM | POA: Insufficient documentation

## 2014-03-05 DIAGNOSIS — Z8673 Personal history of transient ischemic attack (TIA), and cerebral infarction without residual deficits: Secondary | ICD-10-CM | POA: Insufficient documentation

## 2014-03-05 DIAGNOSIS — E119 Type 2 diabetes mellitus without complications: Secondary | ICD-10-CM | POA: Diagnosis not present

## 2014-03-05 DIAGNOSIS — I10 Essential (primary) hypertension: Secondary | ICD-10-CM | POA: Diagnosis not present

## 2014-03-05 DIAGNOSIS — L259 Unspecified contact dermatitis, unspecified cause: Secondary | ICD-10-CM | POA: Insufficient documentation

## 2014-03-05 HISTORY — PX: EUS: SHX5427

## 2014-03-05 LAB — GLUCOSE, CAPILLARY: Glucose-Capillary: 103 mg/dL — ABNORMAL HIGH (ref 70–99)

## 2014-03-05 SURGERY — ESOPHAGEAL ENDOSCOPIC ULTRASOUND (EUS) RADIAL
Anesthesia: Monitor Anesthesia Care

## 2014-03-05 MED ORDER — PROPOFOL 10 MG/ML IV BOLUS
INTRAVENOUS | Status: AC
  Filled 2014-03-05: qty 20

## 2014-03-05 MED ORDER — SODIUM CHLORIDE 0.9 % IV SOLN: INTRAVENOUS | Status: DC

## 2014-03-05 MED ORDER — LACTATED RINGERS IV SOLN
INTRAVENOUS | Status: DC | PRN
  Administered 2014-03-05: 12:00:00 via INTRAVENOUS

## 2014-03-05 MED ORDER — LACTATED RINGERS IV SOLN
INTRAVENOUS | Status: DC
  Administered 2014-03-05: 1000 mL via INTRAVENOUS

## 2014-03-05 MED ORDER — PROPOFOL 10 MG/ML IV BOLUS
INTRAVENOUS | Status: DC | PRN
  Administered 2014-03-05: 25 mg via INTRAVENOUS
  Administered 2014-03-05 (×2): 50 mg via INTRAVENOUS
  Administered 2014-03-05: 25 mg via INTRAVENOUS
  Administered 2014-03-05: 50 mg via INTRAVENOUS

## 2014-03-05 NOTE — Op Note (Signed)
[facility] [place], [address]   ENDOSCOPIC ULTRASOUND PROCEDURE REPORT  PATIENT: Charles [name], Charles [name]  MR#: [id number] BIRTHDATE: 05-18-1935  GENDER: Male ENDOSCOPIST: Arta Silence, MD REFERRED BY:  Lanelle Bal, M.D. PROCEDURE DATE:  03/05/2014 PROCEDURE:   Upper EUS ASA CLASS:      Class III INDICATIONS:   1.  GE junction adenocarcinoma (restaging). MEDICATIONS: MAC sedation, administered by CRNA  DESCRIPTION OF PROCEDURE:   After the risks benefits and alternatives of the procedure were  explained, informed consent was obtained. The patient was then placed in the left, lateral, decubitus postion and IV sedation was administered. Throughout the procedure, the patients blood pressure, pulse and oxygen saturations were monitored continuously.  Under direct visualization, the radial forward-viewing echoendoscope  endoscope was introduced through the mouth  and advanced to the stomach antrum .  Water was used as necessary to provide an acoustic interface.  Upon completion of the imaging, water was removed and the patient was sent to the recovery room in satisfactory condition.    FINDINGS:      3-4 cm tongue of Barrett's mucosa extending proximally from GE junction.  Diffuse mucosal friability of the esophagus without overt lesion.  Friable lesion seen in very proximal cardia, seemingly extending from distal margin of GE junction.  The lesion encompasses about 50% of the circumference of the lumen of the cardia.  The lesion appears to invade into, but not through, the muscularis propria.  There is a round hypoechoic well-defined malignant-appearing lymph node in the immediate vicinity of the mass; I did not feel the benefits of FNA outweighed the risk, as the needle would have to traverse the cardia lesion in order to reach the lymph node.  Celiac axis and left lobe of liver appear grossly normal.  IMPRESSION:     As above. Locoregional EUS  staging of GE junction adenocarcinoma, post-neoadjuvant therapy, would be T2 N1 Mx.  RECOMMENDATIONS:     1.  Watch for potential complications of procedure. 2.  Will forward this information to Dr. Everrett Coombe office, to help facilitate decision-making regarding surgical resection. 3.  Follow-up with [facility] on as-needed basis.   _______________________________ Lorrin MaisArta Silence, MD 03/05/2014 12:47 PM   CC:

## 2014-03-05 NOTE — Anesthesia Postprocedure Evaluation (Signed)
  Anesthesia Post-op Note  Patient: Charles [name]  Procedure(s) Performed: Procedure(s) (LRB): ESOPHAGEAL ENDOSCOPIC ULTRASOUND (EUS) RADIAL (N/A)  Patient Location: PACU  Anesthesia Type: MAC  Level of Consciousness: awake and alert   Airway and Oxygen Therapy: Patient Spontanous Breathing  Post-op Pain: mild  Post-op Assessment: Post-op Vital signs reviewed, Patient's Cardiovascular Status Stable, Respiratory Function Stable, Patent Airway and No signs of Nausea or vomiting  Last Vitals:  Filed Vitals:   03/05/14 1250  BP: 181/82  Pulse: 62  Temp:   Resp: 17    Post-op Vital Signs: stable   Complications: No apparent anesthesia complications

## 2014-03-05 NOTE — Anesthesia Preprocedure Evaluation (Signed)
Anesthesia Evaluation  Patient identified by MRN, date of birth, ID band Patient awake    Reviewed: Allergy & Precautions, H&P , NPO status , Patient's Chart, lab work & pertinent test results  Airway Mallampati: II TM Distance: >3 FB Neck ROM: Full    Dental no notable dental hx.    Pulmonary neg pulmonary ROS, former smoker,  breath sounds clear to auscultation  Pulmonary exam normal       Cardiovascular hypertension, Pt. on medications + CAD, + Cardiac Stents and + CABG Rhythm:Regular Rate:Normal     Neuro/Psych TIACVA negative psych ROS   GI/Hepatic negative GI ROS, Neg liver ROS,   Endo/Other  negative endocrine ROSdiabetes  Renal/GU Renal InsufficiencyRenal disease  negative genitourinary   Musculoskeletal negative musculoskeletal ROS (+)   Abdominal   Peds negative pediatric ROS (+)  Hematology negative hematology ROS (+)   Anesthesia Other Findings   Reproductive/Obstetrics negative OB ROS                           Anesthesia Physical Anesthesia Plan  ASA: III  Anesthesia Plan: MAC   Post-op Pain Management:    Induction: Intravenous  Airway Management Planned: Nasal Cannula  Additional Equipment:   Intra-op Plan:   Post-operative Plan:   Informed Consent: I have reviewed the patients History and Physical, chart, labs and discussed the procedure including the risks, benefits and alternatives for the proposed anesthesia with the patient or authorized representative who has indicated his/her understanding and acceptance.   Dental advisory given  Plan Discussed with: CRNA and Surgeon  Anesthesia Plan Comments:         Anesthesia Quick Evaluation

## 2014-03-05 NOTE — Assessment & Plan Note (Signed)
Likely due to Rhus species of plant.  Rx steroid taper.  Rx Triamcinolone ointment.  Cool compresses.  Return precautions discussed with patient.

## 2014-03-05 NOTE — H&P (Signed)
Patient interval history reviewed.  Patient examined again.  There has been no change from documented H/P dated 02/27/14 (scanned into chart from our office) except as documented above.  Assessment:  1.  GE junction cancer, post neoadjuvant treatment.  Plan:  1.  Endoscopic ultrasound for restaging. 2.  Risks (bleeding, infection, bowel perforation that could require surgery, sedation-related changes in cardiopulmonary systems), benefits (identification and possible treatment of source of symptoms, exclusion of certain causes of symptoms), and alternatives (watchful waiting, radiographic imaging studies, empiric medical treatment) of upper endoscopy with ultrasound (EGD) were explained to patient/family in detail and patient wishes to proceed.

## 2014-03-05 NOTE — Discharge Instructions (Signed)
Endoscopic Ultrasound ° °Care After °Please read the instructions outlined below and refer to this sheet in the next few weeks. These discharge instructions provide you with general information on caring for yourself after you leave the hospital. Your doctor may also give you specific instructions. While your treatment has been planned according to the most current medical practices available, unavoidable complications occasionally occur. If you have any problems or questions after discharge, please call Dr. Rithika Seel ([facility]) at [phone]. ° °HOME CARE INSTRUCTIONS °Activity °· You may resume your regular activity but move at a slower pace for the next 24 hours.  °· Take frequent rest periods for the next 24 hours.  °· Walking will help expel (get rid of) the air and reduce the bloated feeling in your abdomen.  °· No driving for 24 hours (because of the anesthesia (medicine) used during the test).  °· You may shower.  °· Do not sign any important legal documents or operate any machinery for 24 hours (because of the anesthesia used during the test).  °Nutrition °· Drink plenty of fluids.  °· You may resume your normal diet.  °· Begin with a light meal and progress to your normal diet.  °· Avoid alcoholic beverages for 24 hours or as instructed by your caregiver.  °Medications °You may resume your normal medications unless your caregiver tells you otherwise. °What you can expect today °· You may experience abdominal discomfort such as a feeling of fullness or "gas" pains.  °· You may experience a sore throat for 2 to 3 days. This is normal. Gargling with salt water may help this.  °·  °SEEK IMMEDIATE MEDICAL CARE IF: °· You have excessive nausea (feeling sick to your stomach) and/or vomiting.  °· You have severe abdominal pain and distention (swelling).  °· You have trouble swallowing.  °· You have a temperature over 100° F (37.8° C).  °· You have rectal bleeding or vomiting of blood.  °Document  Released: 03/08/2004 Document Revised: 04/06/2011 Document Reviewed: 09/19/2007 °ExitCare® Patient Information ©2012 [employer]. °

## 2014-03-05 NOTE — Transfer of Care (Signed)
Immediate Anesthesia Transfer of Care Note  Patient: Charles [name]  Procedure(s) Performed: Procedure(s): ESOPHAGEAL ENDOSCOPIC ULTRASOUND (EUS) RADIAL (N/A)  Patient Location: PACU  Anesthesia Type:MAC  Level of Consciousness: awake, sedated and patient cooperative  Airway & Oxygen Therapy: Patient Spontanous Breathing and Patient connected to face mask oxygen  Post-op Assessment: Report given to PACU RN and Post -op Vital signs reviewed and stable  Post vital signs: Reviewed and stable  Complications: No apparent anesthesia complications

## 2014-03-06 ENCOUNTER — Encounter (HOSPITAL_COMMUNITY): Payer: Self-pay | Admitting: Gastroenterology

## 2014-03-06 ENCOUNTER — Other Ambulatory Visit: Payer: Self-pay | Admitting: Cardiovascular Disease

## 2014-03-06 ENCOUNTER — Ambulatory Visit (HOSPITAL_COMMUNITY)
Admission: RE | Admit: 2014-03-06 | Discharge: 2014-03-06 | Disposition: A | Discharge status: Home / Self Care | Payer: Medicare Other | Source: Ambulatory Visit | Attending: Cardiology | Admitting: Cardiology

## 2014-03-06 DIAGNOSIS — I2510 Atherosclerotic heart disease of native coronary artery without angina pectoris: Secondary | ICD-10-CM | POA: Diagnosis present

## 2014-03-06 DIAGNOSIS — Z01818 Encounter for other preprocedural examination: Secondary | ICD-10-CM | POA: Diagnosis present

## 2014-03-06 DIAGNOSIS — Z01810 Encounter for preprocedural cardiovascular examination: Secondary | ICD-10-CM

## 2014-03-06 MED ORDER — TECHNETIUM TC 99M SESTAMIBI GENERIC - CARDIOLITE
30.9000 | Freq: Once | INTRAVENOUS | Status: AC | PRN
  Administered 2014-03-06: 30.9 via INTRAVENOUS

## 2014-03-06 MED ORDER — TECHNETIUM TC 99M SESTAMIBI GENERIC - CARDIOLITE
10.5000 | Freq: Once | INTRAVENOUS | Status: AC | PRN
  Administered 2014-03-06: 11 via INTRAVENOUS

## 2014-03-06 MED ORDER — REGADENOSON 0.4 MG/5ML IV SOLN
0.4000 mg | Freq: Once | INTRAVENOUS | Status: AC
  Administered 2014-03-06: 0.4 mg via INTRAVENOUS

## 2014-03-06 NOTE — Telephone Encounter (Signed)
Rx was sent to pharmacy electronically. 

## 2014-03-06 NOTE — Procedures (Addendum)
 [facility] [address] [place] [address] [phone]  Cardiology Nuclear Med Study  TORREN MAFFEO is a 79 y.o. male     MRN : [id number]     DOB: [birth year]  Procedure Date: 03/06/2014  Nuclear Med Background Indication for Stress Test:  Surgical Clearance and Abnormal EKG History:  CAD;CABG X5-01/21/2013;STENT/PTCA-06/07/2013;Implanted monitor;Last NUC MPI on 01/04/2013-ischemia;EF=59%. Cardiac Risk Factors: CVA, History of Smoking, Hypertension, Lipids, NIDDM, RBBB, TIA and severe dysphagia  Symptoms:  Chest Pain, DOE and Fatigue   Nuclear Pre-Procedure Caffeine/Decaff Intake:  7:00pm NPO After: 5:00am   IV Site: R Forearm  IV 0.9% NS with Angio Cath:  22g  Chest Size (in):  46"  IV Started by: Rolene Course, RN  Height: 5\' 11"  (1.803 m)  Cup Size: n/a  BMI:  Body mass index is 23.16 kg/(m^2). Weight:  166 lb (75.297 kg)   Tech Comments:  n/a    Nuclear Med Study 1 or 2 day study: 1 day  Stress Test Type:  [place] Provider:  Sanda Klein, MD   Resting Radionuclide: Technetium 71m Sestamibi  Resting Radionuclide Dose: 10.5 mCi   Stress Radionuclide:  Technetium 28m Sestamibi  Stress Radionuclide Dose: 30.9 mCi           Stress Protocol Rest HR:62 Stress HR: 88  Rest BP:158/89 Stress BP: 168/90  Exercise Time (min): n/a METS: n/a          Dose of Adenosine (mg):  n/a Dose of Lexiscan: 0.4 mg  Dose of Atropine (mg): n/a Dose of Dobutamine: n/a mcg/kg/min (at max HR)  Stress Test Technologist: Mellody Memos, CCT Nuclear Technologist: Imagene Riches, CNMT   Rest Procedure:  Myocardial perfusion imaging was performed at rest 45 minutes following the intravenous administration of Technetium 65m Sestamibi. Stress Procedure:  The patient received IV Lexiscan 0.4 mg over 15-seconds.  Technetium 60m Sestamibi injected IV at 30-seconds.  There were no significant changes with Lexiscan.   Quantitative spect images were obtained after a 45 minute delay.  Transient Ischemic Dilatation (Normal <1.22):  1.13  QGS EDV:  93 ml QGS ESV:  42 ml LV Ejection Fraction: 55%  Rest ECG: NSR-RBBB  Stress ECG: No significant change from baseline ECG  QPS Raw Data Images:  Normal; no motion artifact; normal heart/lung ratio. Stress Images:  There is decreased uptake in the anterior wall. Rest Images:  Normal homogeneous uptake in all areas of the myocardium. Subtraction (SDS):  These findings are consistent with ischemia.  Impression Exercise Capacity:  Lexiscan with no exercise. BP Response:  Normal blood pressure response. Clinical Symptoms:  There is dyspnea. ECG Impression:  No significant ECG changes with Lexiscan. Comparison with Prior Nuclear Study: No images to compare  Overall Impression:  Intermediate risk stress nuclear study with a small area (6%) of anterior reversible ischemia.  LV Wall Motion:  NL LV Function; NL Wall Motion; EF 55%  Pixie Casino, MD, [facility] Board Certified in Nuclear Cardiology Attending Cardiologist [place], MD  03/06/2014 12:55 PM

## 2014-03-13 ENCOUNTER — Encounter: Payer: Self-pay | Admitting: Cardiovascular Disease

## 2014-03-17 ENCOUNTER — Encounter: Payer: Self-pay | Admitting: Physician Assistant

## 2014-03-17 ENCOUNTER — Ambulatory Visit (INDEPENDENT_AMBULATORY_CARE_PROVIDER_SITE_OTHER): Payer: Medicare Other | Admitting: Physician Assistant

## 2014-03-17 VITALS — BP 120/78 | HR 80 | Ht 71.0 in | Wt 172.9 lb

## 2014-03-17 DIAGNOSIS — I25810 Atherosclerosis of coronary artery bypass graft(s) without angina pectoris: Secondary | ICD-10-CM

## 2014-03-17 DIAGNOSIS — I25700 Atherosclerosis of coronary artery bypass graft(s), unspecified, with unstable angina pectoris: Secondary | ICD-10-CM

## 2014-03-17 DIAGNOSIS — I10 Essential (primary) hypertension: Secondary | ICD-10-CM

## 2014-03-17 DIAGNOSIS — E785 Hyperlipidemia, unspecified: Secondary | ICD-10-CM

## 2014-03-17 DIAGNOSIS — Z9861 Coronary angioplasty status: Secondary | ICD-10-CM

## 2014-03-17 DIAGNOSIS — I200 Unstable angina: Secondary | ICD-10-CM

## 2014-03-17 DIAGNOSIS — Z955 Presence of coronary angioplasty implant and graft: Secondary | ICD-10-CM

## 2014-03-17 DIAGNOSIS — Z01818 Encounter for other preprocedural examination: Secondary | ICD-10-CM | POA: Insufficient documentation

## 2014-03-17 DIAGNOSIS — G459 Transient cerebral ischemic attack, unspecified: Secondary | ICD-10-CM

## 2014-03-17 NOTE — Patient Instructions (Signed)
I will call you today or tomorrow.

## 2014-03-17 NOTE — Progress Notes (Signed)
Date:  03/17/2014   ID:  Charles [name], DOB Oct 06, 1934, MRN [id number]  PCP:  Kathlene November, MD  Primary Cardiologist:  Croitoru    History of Present Illness: Charles [name] is a 79 y.o. 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 He has had problems with severe dysphagia and has lost over 30 pounds in weight.  He underwent five-vessel bypass surgery for multivessel CAD in June 2014. He had to undergo sequential implantation of 2 stents only 3 months after bypass surgery for graft failure (initially ramus intermedius, subsequently right coronary artery, last procedure 06/07/2013). Only 3 months after that event he had a 30 minute TIA. Workup for the cause of the TIA was negative and he underwent implantation of a loop recorder in attempt to diagnose possible paroxysmal atrial fibrillation. Unexpectedly, the loop recorder showed evidence of prolonged episodes of second degree AV block, as well as one episode of 14 seconds of asystole, probably related to complete heart block. At all times the QRS complex remained narrow. Surprisingly all of these events were completely asymptomatic. No atrial fibrillation has been seen.  Pacemaker implantation was considered, but the diagnosis of esophageal cancer took precedence.  He was last seen by Dr. Sallyanne Kuster on January 10, 2014.  On March 06, 2014 he had a stress test, for pre-op clearance, which came back as Intermediate risk with a small area (6%) of anterior reversible ischemia.  The patient states he played golf 3 weeks ago, does a lot of walking, yard work without any chest pain.  The patient currently denies nausea, vomiting, fever,  shortness of breath, orthopnea, dizziness, PND, cough, congestion, abdominal pain, hematochezia, melena, lower extremity edema, claudication.  Wt Readings from Last 3 Encounters:  03/17/14 172 lb 14.4 oz (78.427 kg)  03/06/14 166 lb  (75.297 kg)  03/03/14 172 lb 4 oz (78.132 kg)     Past Medical History  Diagnosis Date  . Hypertension   . Hyperlipidemia     diet controlled, statins and zetia intolerant  . Retinopathy     mild glaucomea  . Transient ischemic attack 1999  . Histoplasmosis   . Chronic cough 08/2009    -allergy profile- june 2,2011- neg. -Max GERD rx/ off oils January 07 2010 x 3 weeks only but no benefit. - Sinus CT rec July 11,2011- refused due to claustrophobia -MCT neg for asthma 03/01/10 -Add 1st Gen H1 July 11,2011- better  . Macular degeneration   . Coronary artery disease     CABG 01-2013, stent 04-2013    . Heart murmur     TIA  . Adenocarcinoma of gastroesophageal junction   . Stroke 1995    tia 2 years ago  . Radiation 11/19/13-12/26/13    gastroesophageal junction area 50.4 gray  . Diabetes mellitus     no meds- controls with diet  . PONV (postoperative nausea and vomiting)     extreme nausea    Current Outpatient Prescriptions  Medication Sig Dispense Refill  . aspirin EC 81 MG tablet Take 81 mg by mouth daily.      . Cholecalciferol (VITAMIN D3) 2000 UNITS TABS Take 2,000 Units by mouth daily.      . Coenzyme Q10 (COQ10) 100 MG CAPS Take 100 mg by mouth daily.      . finasteride (PROSCAR) 5 MG tablet Take 5 mg by mouth every morning.       Marland Kitchen  glucose blood test strip Use as instructed to test blood glucose daily Dx: 250.00  100 each  12  . isosorbide mononitrate (IMDUR) 60 MG 24 hr tablet Take 60 mg by mouth every morning.      Marland Kitchen losartan (COZAAR) 50 MG tablet TAKE 1 TABLET BY MOUTH EVERY DAY  90 tablet  3  . metoprolol tartrate (LOPRESSOR) 25 MG tablet Take 25 mg by mouth 2 (two) times daily.      . nitroGLYCERIN (NITROSTAT) 0.4 MG SL tablet Place 0.4 mg under the tongue every 5 (five) minutes as needed for chest pain.      Marland Kitchen omeprazole (PRILOSEC) 20 MG capsule Take 20 mg by mouth 2 (two) times daily before a meal.      . pravastatin (PRAVACHOL) 40 MG tablet Take 40 mg by mouth daily.        No current facility-administered medications for this visit.    Allergies:    Allergies  Allergen Reactions  . Percocet [Oxycodone-Acetaminophen] Other (See Comments)    hallucinations  . Atorvastatin Other (See Comments)    Causes myalgias   . Ezetimibe Other (See Comments)    Causes dizziness  . Lovastatin Other (See Comments)    REACTION: weakness. Patient able to tolerate pravastatin  . Ranexa [Ranolazine] Other (See Comments)    Severe constipation  . Tramadol     Nausea and Vomiting    Social History:  The patient  reports that he quit smoking about 37 years ago. His smoking use included Cigarettes. He has a 10 pack-year smoking history. He has never used smokeless tobacco. He reports that he does not drink alcohol or use illicit drugs.   Family history:   Family History  Problem Relation Age of Onset  . Coronary artery disease Neg Hx   . Stroke Neg Hx   . Colon cancer Neg Hx   . Prostate cancer Neg Hx   . Atopy Neg Hx   . Asthma Neg Hx   . Hypertension Mother   . Hypertension Father   . Diabetes Father   . Cancer Father     colon  . Hypertension Brother   . Cancer Brother     lung  . Hypertension Sister   . Cancer Sister     lung  . Diabetes Brother   . Diabetes Sister   . Diabetes      Grandparents   . Lung cancer Brother   . Lung cancer Sister     ROS:  Please see the history of present illness.  All other systems reviewed and negative.   PHYSICAL EXAM: VS:  BP 120/78  Pulse 80  Ht 5\' 11"  (1.803 m)  Wt 172 lb 14.4 oz (78.427 kg)  BMI 24.13 kg/m2 Well nourished, well developed, in no acute distress HEENT: Pupils are equal round react to light accommodation extraocular movements are intact.  Neck: no JVDNo cervical lymphadenopathy. Cardiac: Regular rate and rhythm without murmurs rubs or gallops. Lungs:  clear to auscultation bilaterally, no wheezing, rhonchi or rales Abd: soft, nontender, positive bowel sounds all quadrants, no  hepatosplenomegaly Ext: no lower extremity edema.  2+ radial and dorsalis pedis pulses. Skin: warm and dry Neuro:  Grossly normal  EKG:  none   ASSESSMENT AND PLAN:  Problem List Items Addressed This Visit   HYPERLIPIDEMIA (Chronic)     On a statin.    HYPERTENSION - Primary (Chronic)     Bp well controlled    CAD- S/P CABG  x 5 (LIMA-LAD, SVG-RI-OM1, SVG to distal RCA, SVG-PDA) 01/21/13 (Chronic)   S/P SVG-RI/OM DES 05/06/13- staged DES to RCA 06/07/13   Pre-operative clearance     The patient had a recent nuclear stress test which was interpreted as Intermediate risk  With a small area (6%) of anterior reversible ischemia.  LV Wall Motion: NL LV Function; NL Wall Motion; EF 55%.   He does not report any anginal symptoms and is adamant about having surgery.   Will review with Dr.Croitoru before giving clearance.

## 2014-03-17 NOTE — Assessment & Plan Note (Signed)
Bp well controlled

## 2014-03-17 NOTE — Assessment & Plan Note (Addendum)
The patient had a recent nuclear stress test which was interpreted as Intermediate risk  With a small area (6%) of anterior reversible ischemia.  LV Wall Motion: NL LV Function; NL Wall Motion; EF 55%.   He does not report any anginal symptoms and is adamant about having surgery.   Will review with Dr.Croitoru before giving clearance.

## 2014-03-17 NOTE — Assessment & Plan Note (Signed)
On a statin 

## 2014-03-19 ENCOUNTER — Telehealth: Payer: Self-pay | Admitting: Cardiovascular Disease

## 2014-03-19 NOTE — Telephone Encounter (Signed)
Would like to speak to someone to clarify if he needs to have more test done before surgery, because Dr.Croitoru says that he is ok to have surgery. He received a letter that he should keep appointment with Gaspar Bidding to discuss the stress test results and at that time he was told that he needed more test before he can have surgery . So he is asking for clarification .Marland Kitchen Please call

## 2014-03-19 NOTE — Telephone Encounter (Signed)
Returned call to patient he stated he was upset.Stated he needed surgical clearance from Dr.Croitoru for upcoming surgery.Stated he is scheduled to see stomach surgeon Friday 03/21/14.Stated he recently saw Tarri Fuller PA and he would not clear him.Spoke to Dr.Croitoru he stated patient is cleared to have surgery.Dr.Croitoru will send note to surgeon.Patient stated 2 surgeons are involved Dr.Matthew Hassell Done and Dr.Edward Servando Snare.Message sent to Dr.Croitoru.

## 2014-03-21 ENCOUNTER — Encounter (INDEPENDENT_AMBULATORY_CARE_PROVIDER_SITE_OTHER): Payer: Self-pay | Admitting: Surgery

## 2014-03-21 ENCOUNTER — Ambulatory Visit (INDEPENDENT_AMBULATORY_CARE_PROVIDER_SITE_OTHER): Payer: Medicare Other | Admitting: Surgery

## 2014-03-21 VITALS — BP 136/82 | HR 71 | Temp 97.3°F | Resp 16 | Ht 71.0 in | Wt 173.6 lb

## 2014-03-21 DIAGNOSIS — C159 Malignant neoplasm of esophagus, unspecified: Secondary | ICD-10-CM

## 2014-03-21 DIAGNOSIS — I6350 Cerebral infarction due to unspecified occlusion or stenosis of unspecified cerebral artery: Secondary | ICD-10-CM

## 2014-03-21 NOTE — Progress Notes (Signed)
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 Diagnosed March and treated with radiation chemotherapy.  History of Present Illness:  Charles [name] is an 79 y.o. male referred by Dr. 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 This was diagnosed in March on endoscopy by Dr. Oletta Lamas and was treated with chemotherapy and radiation therapy. He is ready to proceed with a resection of the surgery. I spoke with Dr. Servando Snare about him and we plan a laparoscopic approach and to possibly complete the resection through  the chest if necessary.  Dr. Paulita Fujita is performed he [place] after chemoradiation in their may be one node that may be positive. PET scan was done pretreatment and there was a positive node in the gastrohepatic region.  I discussed the surgical management with him. CT scan chemoradiation we may need to extend our proximal margin to anastomose in healthy esophageal tissue. He is aware that we would likely place a feeding jejunostomy.  We'll go ahead and get our office to schedule this jointly with Dr. Servando Snare.    Past Medical History  Diagnosis Date  . Hypertension   . Hyperlipidemia     diet controlled, statins and zetia intolerant  . Retinopathy     mild glaucomea  . Transient ischemic attack 1999  . Histoplasmosis   . Chronic cough 08/2009    -allergy profile- june 2,2011- neg. -Max GERD rx/ off oils January 07 2010 x 3 weeks only but no benefit. - Sinus CT rec July 11,2011- refused due to claustrophobia -MCT neg for asthma 03/01/10 -Add 1st Gen H1 July 11,2011- better  . Macular degeneration   . Coronary artery disease     CABG 01-2013, stent 04-2013    . Heart murmur     TIA  . Adenocarcinoma of gastroesophageal junction   . Stroke 1995    tia 2 years ago  . Radiation 11/19/13-12/26/13    gastroesophageal junction area 50.4 gray  . Diabetes mellitus     no  meds- controls with diet  . PONV (postoperative nausea and vomiting)     extreme nausea    Past Surgical History  Procedure Laterality Date  . Cervical spine surgery  2002    plate  . Cholecystectomy  1985  . Percutaneous coronary stent intervention (pci-s)  101/31/2014    [facility]  . Coronary artery bypass graft N/A 01/21/2013    Procedure: Coronary Arery Bypass Grafting  Times Five Using Left Internal Mammary Artery and Right Saphenous Leg Vein Harvested Endoscopically;  Surgeon: Melrose Nakayama, MD;  Location: [place];  Service: Open Heart Surgery;  Laterality: N/A;  . Tee without cardioversion N/A 08/23/2013    Procedure: TRANSESOPHAGEAL ECHOCARDIOGRAM (TEE);  Surgeon: Sanda Klein, MD;  Location[facility] ENDOSCOPY;  Service: Cardiovascular;  Laterality: N/A;  . Esophagogastroduodenoscopy N/A 10/17/2013    Procedure: ESOPHAGOGASTRODUODENOSCOPY (EGD);  Surgeon: Winfield Cunas., MD;  Location: [facility] ENDOSCOPY;  Service: Endoscopy;  Laterality: N/A;  . Appendectomy    . Esophagogastroduodenoscopy N/A 02/05/2014    Procedure: ESOPHAGOGASTRODUODENOSCOPY (EGD);  Surgeon: Winfield Cunas., MD;  Location: [facility] ENDOSCOPY;  Service: Endoscopy;  Laterality: N/A;  . Eus N/A 03/05/2014    Procedure: ESOPHAGEAL ENDOSCOPIC ULTRASOUND (EUS) RADIAL;  Surgeon: Arta Silence, MD;  Location: [facility] ENDOSCOPY;  Service: Endoscopy;  Laterality: N/A;    Current Outpatient Prescriptions  Medication Sig Dispense Refill  . aspirin EC 81 MG tablet Take 81  mg by mouth daily.      . Cholecalciferol (VITAMIN D3) 2000 UNITS TABS Take 2,000 Units by mouth daily.      . Coenzyme Q10 (COQ10) 100 MG CAPS Take 100 mg by mouth daily.      . finasteride (PROSCAR) 5 MG tablet Take 5 mg by mouth every morning.       Marland Kitchen glucose blood test strip Use as instructed to test blood glucose daily Dx: 250.00  100 each  12  . isosorbide mononitrate (IMDUR) 60 MG 24 hr tablet Take 60 mg by mouth every morning.      Marland Kitchen losartan (COZAAR)  50 MG tablet TAKE 1 TABLET BY MOUTH EVERY DAY  90 tablet  3  . metoprolol tartrate (LOPRESSOR) 25 MG tablet Take 25 mg by mouth 2 (two) times daily.      . nitroGLYCERIN (NITROSTAT) 0.4 MG SL tablet Place 0.4 mg under the tongue every 5 (five) minutes as needed for chest pain.      Marland Kitchen omeprazole (PRILOSEC) 20 MG capsule Take 20 mg by mouth 2 (two) times daily before a meal.      . pravastatin (PRAVACHOL) 40 MG tablet Take 40 mg by mouth daily.       No current facility-administered medications for this visit.   Percocet; Atorvastatin; Ezetimibe; Lovastatin; Ranexa; and Tramadol Family History  Problem Relation Age of Onset  . Coronary artery disease Neg Hx   . Stroke Neg Hx   . Colon cancer Neg Hx   . Prostate cancer Neg Hx   . Atopy Neg Hx   . Asthma Neg Hx   . Hypertension Mother   . Hypertension Father   . Diabetes Father   . Cancer Father     colon  . Hypertension Brother   . Cancer Brother     lung  . Hypertension Sister   . Cancer Sister     lung  . Diabetes Brother   . Diabetes Sister   . Diabetes      Grandparents   . Lung cancer Brother   . Lung cancer Sister    Social History:   reports that he quit smoking about 37 years ago. His smoking use included Cigarettes. He has a 10 pack-year smoking history. He has never used smokeless tobacco. He reports that he does not drink alcohol or use illicit drugs.   REVIEW OF SYSTEMS : Negative except for hearing loss  Physical Exam:   Blood pressure 136/82, pulse 71, temperature 97.3 F (36.3 C), temperature source Temporal, resp. rate 16, height 5\' 11"  (1.803 m), weight 173 lb 9.6 oz (78.744 kg). Body mass index is 24.22 kg/(m^2).  Gen:  WDWN WM NAD  Neurological: Alert and oriented to person, place, and time. Motor and sensory function is grossly intact  Head: Normocephalic and atraumatic.  Eyes: Conjunctivae are normal. Pupils are equal, round, and reactive to light. No scleral icterus.  Neck: Normal range of motion.  Neck supple. No tracheal deviation or thyromegaly present.  Cardiovascular:  SR without murmurs or gallops.  No carotid bruits-median sternotomy incision Breast:  Not examined Respiratory: Effort normal.  No respiratory distress. No chest wall tenderness. Breath sounds normal.  No wheezes, rales or rhonchi.  Abdomen:  There is a right paramedian incision from on open chole in the late 70s.   GU:  Not examined.  PET showed a highlight inside the prostate of uncertain significance Musculoskeletal: Normal range of motion. Extremities are nontender. No cyanosis, edema  or clubbing noted Lymphadenopathy: No cervical, preauricular, postauricular or axillary adenopathy is present Skin: Skin is warm and dry. No rash noted. No diaphoresis. No erythema. No pallor. Pscyh: Normal mood and affect. Behavior is normal. Judgment and thought content normal.   LABORATORY RESULTS: No results found for this or any previous visit (from the past 48 hour(s)).   RADIOLOGY RESULTS: No results found.  Problem List: Patient Active Problem List   Diagnosis Date Noted  . Pre-operative clearance 03/17/2014  . Contact dermatitis 03/05/2014  . Gastroesophageal cancer 10/28/2013  . Second degree AV block 10/25/2013  . CVA (cerebral infarction) 08/06/2013  . TIA (transient ischemic attack) 08/06/2013  . H/O: CVA (cerebrovascular accident) 08/06/2013  . Unspecified constipation 06/14/2013  . S/P SVG-RI/OM DES 05/06/13- staged DES to RCA 06/07/13 06/08/2013  . Chronic renal insufficiency, stage III (moderate) 06/08/2013  . RBBB 06/08/2013  . Angina, class III 06/07/2013  . Anxiety and depression 05/14/2013  . CAD- S/P CABG x 5 (LIMA-LAD, SVG-RI-OM1, SVG to distal RCA, SVG-PDA) 01/21/13 01/23/2013  . Left main coronary artery disease 01/16/2013  . Abnormal nuclear cardiac imaging test - Inferior Ischemia 01/07/2013  . Yellow jacket sting 04/20/2012  . Annual physical exam 02/17/2012  . Fatigue 01/13/2012  .  Onychomycosis 11/29/2010  . GLAUCOMA 12/16/2009  . DIABETES MELLITUS, TYPE II 06/07/2007  . INSOMNIA-SLEEP DISORDER-UNSPEC 05/07/2007  . HYPERLIPIDEMIA 03/01/2007  . HYPERTENSION 03/01/2007    Assessment & Plan: Esophagogastric cancer;  Will schedule with Dr. Servando Snare at [facility] when our schedules permit.     Matt B. Hassell Done, MD, [facility] [phone] beeper [phone]  03/21/2014 4:19 PM

## 2014-03-21 NOTE — Patient Instructions (Signed)
Esophagogastrectomy Esophagogastrectomy is a surgery to remove a diseased portion of your esophagus. The upper portion of your stomach and some lymph nodes in the area are removed at the same time. In most cases, your stomach is then attached directly to the remaining portion of the esophagus. Esophagogastrectomy is often done as a treatment for cancer or precancerous conditions. The surgery may also be done to treat severe injuries to the esophagus. LET YOUR CAREGIVER KNOW ABOUT:   Allergies to food or medicine.  Medicines taken, including vitamins, herbs, eyedrops, over-the-counter medicines, and creams.  Use of steroids (by mouth or creams).  Previous problems with anesthetics or numbing medicines.  History of bleeding problems or blood clots.  Previous surgery.  Other health problems, including diabetes and kidney problems.  Possibility of pregnancy, if this applies. RISKS AND COMPLICATIONS   Allergies to medicines used during the procedure.  Problems with breathing.  Bleeding.  Infection.  Damage to other structures near the esophagus and stomach.  Problems with swallowing. BEFORE THE PROCEDURE   Stop smoking if you smoke. Stopping will improve your health after surgery.  You may have blood tests to make sure your blood clots normally. Ask your caregiver about changing or stopping your regular medicines. You may be asked to stop taking blood thinners (anticoagulants) or nonsteroidal anti-inflammatory drugs (NSAIDs).  Do not eat or drink anything at least 8 hours before the surgery.  You and your caregiver will talk about the different surgical approaches. Together, you will agree on the surgical approach that is right for you. PROCEDURE   An intravenous (IV) access tube is put into one of your veins to give you fluids and medicines.  You will receive medicines to relax you (sedatives) and medicines that make you sleep (general anesthetic).  You may have a flexible  tube (catheter) put into your bladder to drain urine.  You may have a tube put through your nose or mouth into your stomach (NG tube) to remove digestive juices and prevent you from vomiting and feeling nauseous.  Surgical cuts (incisions) may be made in the throat, chest, or abdomen. Multiple smaller incisions may be made if the surgery can be done using a scope.  The part of the stomach to be removed will be stapled off and taken out.  The diseased length of esophagus will be removed, as well as lymph nodes in the area.  The remaining portion of the stomach will be attached to the remaining portion of the esophagus.  All incisions will be closed with stitches (sutures), staples, or surgical glue. AFTER THE PROCEDURE   You will wake up in a recovery room, resting in bed until you have fully returned to consciousness.  You will have some pain. Pain medicines will be available to help you.  Your temperature, breathing rate, heart rate, blood pressure, and oxygen level (vital signs) will be monitored regularly.  You may be admitted to an intensive care unit (ICU) in the hospital for 1-2 days. There, you can be closely monitored.  The head of your bed will be kept at an upright angle.  You will likely have an NG tube that goes through your nose and into your stomach.  You may have a feeding tube. This tube will give you the proper nutrition until you can take food by mouth again.  You will likely have multiple tubes that are draining fluid from the incision.  You will have a catheter in your bladder.  You will continue to  receive IV fluids.  You may have compression stockings on your legs to prevent blood clots.  You will be taught some breathing exercises. These will help your lungs recover from the anesthesia.  When you are more stable, you will be transferred to a regular hospital room.  You will probably be in the hospital for about 10-14 days. During this time, various  drains, tubes, and monitors will slowly be discontinued when they are no longer needed. You will also be slowly eased into doing more activity and drinking and eating by mouth again. Document Released: 01/24/2012 Document Reviewed: 01/24/2012 [facility] Patient Information 2015 [employer]. This information is not intended to replace advice given to you by your health care provider. Make sure you discuss any questions you have with your health care provider.

## 2014-03-25 ENCOUNTER — Ambulatory Visit (INDEPENDENT_AMBULATORY_CARE_PROVIDER_SITE_OTHER): Payer: Medicare Other | Admitting: *Deleted

## 2014-03-25 DIAGNOSIS — I639 Cerebral infarction, unspecified: Secondary | ICD-10-CM

## 2014-03-25 DIAGNOSIS — I6350 Cerebral infarction due to unspecified occlusion or stenosis of unspecified cerebral artery: Secondary | ICD-10-CM

## 2014-03-25 LAB — MDC_IDC_ENUM_SESS_TYPE_REMOTE

## 2014-03-26 ENCOUNTER — Telehealth: Payer: Self-pay | Admitting: *Deleted

## 2014-03-26 NOTE — Telephone Encounter (Signed)
Verbal order received and read back from Dr. Benay Spice to notify [facility] staff that [name] has an estimated twenty percent survival of the esophageal cancer.  Having co-morbidities places significance on his morbidity and mortality with surgery. Called [employer] with this information.  Spoke with Ivin Booty who says "due to his "high risk and other medical problems, [employer] [facility] will get his son home".

## 2014-03-26 NOTE — Telephone Encounter (Signed)
Message received from [employer].  Would like call from provider.  This  Nurse spoke with "Erline Levine" who reports  spoken with Dr. Servando Snare who referred to this office after verifying diagnosis and surgery is pending cardiac clearance.  "1. [employer] needs to know patient's prognosis.  We know he is stable now but 2. What is his life expectancy.  Oren Section requested the emergency because of his situation he wants his son.  Call [employer] at [phone].  Case number [id number]." Will notify providers

## 2014-03-27 ENCOUNTER — Encounter: Payer: Self-pay | Admitting: Cardiothoracic Surgery

## 2014-03-27 ENCOUNTER — Ambulatory Visit (INDEPENDENT_AMBULATORY_CARE_PROVIDER_SITE_OTHER): Payer: Medicare Other | Admitting: Cardiothoracic Surgery

## 2014-03-27 VITALS — BP 128/72 | HR 68 | Ht 71.0 in | Wt 173.0 lb

## 2014-03-27 DIAGNOSIS — C160 Malignant neoplasm of cardia: Secondary | ICD-10-CM

## 2014-03-27 DIAGNOSIS — I2510 Atherosclerotic heart disease of native coronary artery without angina pectoris: Secondary | ICD-10-CM

## 2014-03-27 DIAGNOSIS — Z951 Presence of aortocoronary bypass graft: Secondary | ICD-10-CM

## 2014-03-27 DIAGNOSIS — I6350 Cerebral infarction due to unspecified occlusion or stenosis of unspecified cerebral artery: Secondary | ICD-10-CM

## 2014-03-27 NOTE — Progress Notes (Signed)
[address]       [place] [address]             [id number]                    Charles [name] [place] Medical Record #[id number] Date of Birth: 1935/05/25  Referring:Dr Charles [name] Primary Care: Charles November, MD  Chief Complaint:    Chief Complaint  Patient presents with  . F/U THORACIC    F/U AFTER ESOPHAGEAL/US/CARDIOLOGY CLEARENCE    History of Present Illness:    Charles [name] 79 y.o. male is seen in the office  today for gastric cancer. In April of 2015 the patient noted a single tarry stool. He had no other symptoms of difficulty swallowing or painful swallowing. Dr Charles [name] did a UGI endoscopy and bx obtained of ulcered mass of GE junction and cardia.  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 RESULT RATIO OF HER2: CEP 17 SIGNALS 1.15 AVERAGE HER2 COPY NUMBER PER CELL 2.35 REFERENCE RANGE NEGATIVE HER2/Chr17 Ratio <2.0 and Average HER2 copy number <4.0 EQUIVOCAL HER2/Chr17 Ratio <2.0 and Average HER2 copy number 4.0 and <6.0 POSITIVE HER2/Chr17 Ratio >=2.0 and/or Average HER2 copy number >=6.0 Charles Laws MD Pathologist, Electronic Signature ( Signed 11/01/2013) FINAL Gastroesophageal cancer   Primary site: Esophagus - Adenocarcinoma   Staging method: AJCC 7th Edition   Clinical: (TX, N1, M0)   Summary: (TX, N1, M0)  Patent has completed chemo and radiation therapy (last radiation May17) . He feels well. He is taking po diet without difficulty. Repeat endo with EUS done and cardiology has seen for clearance. Currently he has no anginal symptoms.  Current Activity/ Functional Status:  Patient is independent with mobility/ambulation, transfers, ADL's, IADL's.   Zubrod Score: At the time of surgery this patient's most appropriate activity status/level should be  described as: []     0    Normal activity, no symptoms [x]     1    Restricted in physical strenuous activity but ambulatory, able to do out light work []     2    Ambulatory and capable of self care, unable to do work activities, up and about               >50 % of waking hours                              []     3    Only limited self care, in bed greater than 50% of waking hours []     4    Completely disabled, no self care, confined to bed or chair []     5    Moribund   Past Medical History  Diagnosis Date  . Hypertension   . Hyperlipidemia     diet controlled, statins and zetia intolerant  . Retinopathy     mild glaucomea  . Transient ischemic attack 1999  . Histoplasmosis   . Chronic cough 08/2009    -allergy profile- june 2,2011- neg. -Max GERD rx/ off oils January 07 2010 x 3 weeks only but no benefit. - Sinus CT rec July 11,2011- refused due to claustrophobia -MCT neg for asthma 03/01/10 -Add 1st Gen H1 July 11,2011- better  . Macular degeneration   . Coronary artery disease  CABG 01-2013, stent 04-2013    . Heart murmur     TIA  . Adenocarcinoma of gastroesophageal junction   . Stroke 1995    tia 2 years ago  . Radiation 11/19/13-12/26/13    gastroesophageal junction area 50.4 gray  . Diabetes mellitus     no meds- controls with diet  . PONV (postoperative nausea and vomiting)     extreme nausea    Past Surgical History  Procedure Laterality Date  . Cervical spine surgery  2002    plate  . Cholecystectomy  1985  . Percutaneous coronary stent intervention (pci-s)  101/31/2014    Charles [name]  . Coronary artery bypass graft N/A 01/21/2013    Procedure: Coronary Arery Bypass Grafting  Times Five Using Left Internal Mammary Artery and Right Saphenous Leg Vein Harvested Endoscopically;  Surgeon: Charles Nakayama, MD;  Location: [facility];  Service: Open Heart Surgery;  Laterality: N/A;  . Tee without cardioversion N/A 08/23/2013    Procedure: TRANSESOPHAGEAL ECHOCARDIOGRAM (TEE);   Surgeon: Charles Klein, MD;  Location: [facility] ENDOSCOPY;  Service: Cardiovascular;  Laterality: N/A;  . Esophagogastroduodenoscopy N/A 10/17/2013    Procedure: ESOPHAGOGASTRODUODENOSCOPY (EGD);  Surgeon: Charles Cunas., MD;  Location: [facility] ENDOSCOPY;  Service: Endoscopy;  Laterality: N/A;  . Appendectomy    . Esophagogastroduodenoscopy N/A 02/05/2014    Procedure: ESOPHAGOGASTRODUODENOSCOPY (EGD);  Surgeon: Charles Cunas., MD;  Location: [facility] ENDOSCOPY;  Service: Endoscopy;  Laterality: N/A;  . Eus N/A 03/05/2014    Procedure: ESOPHAGEAL ENDOSCOPIC ULTRASOUND (EUS) RADIAL;  Surgeon: Charles Silence, MD;  Location: [facility] ENDOSCOPY;  Service: Endoscopy;  Laterality: N/A;    Family History  Problem Relation Age of Onset  . Coronary artery disease Neg Hx   . Stroke Neg Hx   . Colon cancer Neg Hx   . Prostate cancer Neg Hx   . Atopy Neg Hx   . Asthma Neg Hx   . Hypertension Mother   . Hypertension Father   . Diabetes Father   . Cancer Father     colon  . Hypertension Brother   . Cancer Brother     lung  . Hypertension Sister   . Cancer Sister     lung  . Diabetes Brother   . Diabetes Sister   . Diabetes      Grandparents   . Lung cancer Brother   . Lung cancer Sister     History   Social History  . Marital Status: Married    Spouse Name: Charles [name]    Number of Children: 2  . Years of Education: college   Occupational History  . reitred    Social History Main Topics  . Smoking status: Former Smoker -- 1.00 packs/day for 10 years    Types: Cigarettes    Quit date: 01/17/1977  . Smokeless tobacco: Never Used     Comment: Stopped at age 89   . Alcohol Use: No     Comment: seldom  . Drug Use: No  . Sexual Activity: No   Other Topics Concern  . 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  Former Smoker -- 1.00 packs/day for 10 years  . Types: Cigarettes  . Quit date: 01/17/1977  Smokeless tobacco  . Never  Used    Comment: Stopped at age 54     History  Alcohol Use No    Comment: seldom     Allergies  Allergen Reactions  . Percocet [Oxycodone-Acetaminophen] Other (See Comments)    hallucinations  . Atorvastatin Other (See Comments)    Causes myalgias   . Ezetimibe Other (See Comments)    Causes dizziness  . Lovastatin Other (See Comments)    REACTION: weakness. Patient able to tolerate pravastatin  . Ranexa [Ranolazine] Other (See Comments)    Severe constipation  . Tramadol     Nausea and Vomiting    Current Outpatient Prescriptions  Medication Sig Dispense Refill  . aspirin EC 81 MG tablet Take 81 mg by mouth daily.      . Cholecalciferol (VITAMIN D3) 2000 UNITS TABS Take 2,000 Units by mouth daily.      . Coenzyme Q10 (COQ10) 100 MG CAPS Take 100 mg by mouth daily.      . finasteride (PROSCAR) 5 MG tablet Take 5 mg by mouth every morning.       Marland Kitchen glucose blood test strip Use as instructed to test blood glucose daily Dx: 250.00  100 each  12  . isosorbide mononitrate (IMDUR) 60 MG 24 hr tablet Take 60 mg by mouth every morning.      Marland Kitchen losartan (COZAAR) 50 MG tablet TAKE 1 TABLET BY MOUTH EVERY DAY  90 tablet  3  . metoprolol tartrate (LOPRESSOR) 25 MG tablet Take 25 mg by mouth 2 (two) times daily.      Marland Kitchen omeprazole (PRILOSEC) 20 MG capsule Take 20 mg by mouth 2 (two) times daily before a meal.      . pravastatin (PRAVACHOL) 40 MG tablet Take 40 mg by mouth daily.      . nitroGLYCERIN (NITROSTAT) 0.4 MG SL tablet Place 0.4 mg under the tongue every 5 (five) minutes as needed for chest pain.       No current facility-administered medications for this visit.     Review of Systems:     Cardiac Review of Systems: Y or N  Chest Pain [ n   ]  Resting SOB [ n] Exertional SOB  [ n  Orthopnea [n  ]   Pedal Edema [ n  ]    Palpitationsn ] Syncope  [n ]     Presyncope n  ]  General Review of Systems: [Y] = yes [  ]=no Constitional: recent weight change [n  ];  Wt loss over the last 3 months [   ] anorexia [  ]; fatigue [  ]; nausea [  ]; night sweats [  ]; fever [  ]; or chills [  ];          Dental: poor dentition[  ]; Last Dentist visit:   Eye : blurred vision [  ]; diplopia [   ]; vision changes [  ];  Amaurosis fugax[  ]; Resp: cough [  ];  wheezing[  ];  hemoptysis[  ]; shortness of breath[  ]; paroxysmal nocturnal dyspnea[  ]; dyspnea on exertion[  ]; or orthopnea[  ];  GI:  gallstones[  ], vomiting[  ];  dysphagia[  ]; melena[  ];  hematochezia [  ]; heartburn[  ];   Hx of  Colonoscopy[  ]; GU: kidney stones [  ]; hematuria[  ];   dysuria [  ];  nocturia[  ];  history of     obstruction [  ];  urinary frequency Blue.Reese  ]             Skin: rash, swelling[  ];, hair loss[  ];  peripheral edema[  ];  or itching[  ]; Musculosketetal: myalgias[  ];  joint swelling[  ];  joint erythema[  ];  joint pain[  ];  back pain[  ];  Heme/Lymph: bruising[  ];  bleeding[  ];  anemia[  ];  Neuro: TIA[ yes 07/30/2013 ];  headaches[  ];  stroke[  ];  vertigo[  ];  seizures[  ];   paresthesias[  ];  difficulty walking[n  ];  Psych:depression[  ]; anxiety[  ];  Endocrine: diabetes[ y ];  thyroid dysfunction[ n ];  Immunizations: Flu up to date [?  ]; Pneumococcal up to date [?  ];  Other:  Physical Exam: BP 128/72  Pulse 68  Ht 5' 11"  (1.803 m)  Wt 173 lb (78.472 kg)  BMI 24.14 kg/m2  SpO2 98%  PHYSICAL EXAMINATION:  General appearance: alert, cooperative and appears stated age Neurologic: intact Heart: regular rate and rhythm, S1, S2 normal, no murmur, click, rub or gallop Lungs: clear to auscultation bilaterally and normal percussion bilaterally Abdomen: soft, non-tender; bowel sounds normal; no masses,  no organomegaly Extremities: extremities normal, atraumatic, no cyanosis or edema no cervical , supraclavicular or axillary nodes No pedal  edema  Diagnostic Studies & Laboratory data:     Recent Radiology Findings:   Nm Pet Image Restag (ps) Skull Base To Thigh  02/13/2014   CLINICAL DATA:  Subsequent treatment strategy for gastroesophageal cancer. Radiation therapy completed 12/26/2013. Undergoing chemotherapy.  EXAM: NUCLEAR MEDICINE PET SKULL BASE TO THIGH  TECHNIQUE: 7.3 mCi F-18 FDG was injected intravenously. Full-ring PET imaging was performed from the skull base to thigh after the radiotracer. CT data was obtained and used for attenuation correction and anatomic localization.  FASTING BLOOD GLUCOSE:  Value: 105 mg/dl  COMPARISON:  PET-CT 11/08/2013. CTs of the chest CT, abdomen and pelvis 10/18/2013.  FINDINGS: NECK  No hypermetabolic cervical lymph nodes are identified.There are no lesions of the pharyngeal mucosal space.  CHEST  There are no hypermetabolic mediastinal, hilar or axillary lymph nodes. There is no hypermetabolic pulmonary activity. Calcified right hilar lymph nodes and a calcified right upper lobe granuloma are again noted. Emphysema and extensive atherosclerosis are noted status post CABG.  ABDOMEN/PELVIS  The degree of hypermetabolic activity at the gastroesophageal junction and extending into the gastric cardia has decreased compared with the prior study. This currently has an SUV max of 14.3 (previously 44.6). No residual hypermetabolic surrounding lymph nodes are identified. There is no abnormal hypermetabolic activity within the liver, adrenal glands, spleen or pancreas. No hypermetabolic abdominal pelvic lymph nodes are seen. The prostate gland is mildly enlarged and demonstrates mild nonspecific central hypermetabolic activity (SUV max 5.6). This appears similar to the prior study.  SKELETON  There is no hypermetabolic activity to suggest osseous metastatic disease. There is decreased activity within the spine near the thoracolumbar junction attributed to interval radiation therapy.  IMPRESSION: 1. There is  decreased hypermetabolic activity within the primary cancer involving the gastroesophageal junction and gastric cardia consistent with response to therapy. No residual hypermetabolic adjacent lymph nodes are identified. 2. No evidence of distant metastases. 3. Mild nonspecific hypermetabolic activity centrally within the prostate gland.   Electronically Signed   By: Camie Patience M.D.   On: 02/13/2014 09:52  Nm Pet Image Restag (ps) Skull Base To Thigh CLINICAL DATA: Initial  treatment strategy for invasive poorly  differentiated adenocarcinoma of the esophagogastric junction.  EXAM:  NUCLEAR MEDICINE PET SKULL BASE TO THIGH  TECHNIQUE:  9.6 mCi F-18 FDG was injected intravenously. Full-ring PET imaging  was performed from the skull base to thigh after the radiotracer. CT  data was obtained and used for attenuation correction and anatomic  localization.  FASTING BLOOD GLUCOSE: Value: 111 mg/dl  COMPARISON: CT ABD W/CM dated 10/18/2013  FINDINGS:  NECK  No hypermetabolic lymph nodes in the neck.  CHEST  No hypermetabolic mediastinal or hilar nodes. There is mild  hypermetabolic uptake in the distal esophagus, leading into the  esophagogastric junction.  No suspicious pulmonary nodules on the CT scan. Calcified lymph  nodes are seen in the right hilum with calcified right upper lobe  granuloma noted. There is some dependent atelectasis in the lung  bases with a posterior right lower lobe tiny calcified granuloma  noted.  ABDOMEN/PELVIS  Wall thickening is identified in the distal esophagus and  esophagogastric junction. Esophagogastric junction is markedly  hypermetabolic, consistent with the patient's known adenocarcinoma.  The gastrohepatic ligament lymph node which was seen on the recent  CT scan is also hypermetabolic consistent with metastatic disease.  No abnormal hypermetabolic activity within the liver, pancreas,  adrenal glands, or spleen.  SKELETON  No focal hypermetabolic  activity to suggest skeletal metastasis.  IMPRESSION:  Markedly hypermetabolic lesion at the esophagogastric junction,  consistent with the patient's history of known primary malignancy.  There is low level uptake in the distal esophagus proximal to the  dominant lesion and hypermetabolic activity is identified in the  previously described gastrohepatic ligament lymph node, consistent  with metastatic involvement.  No evidence for distant metastases in the neck, chest, or pelvis.  Electronically Signed  By: Misty Stanley M.D.  On: 11/08/2013 09:13  EUS post treatment: 3-4 cm tongue of Barrett's mucosa extending proximally from GE junction. Diffuse mucosal friability of the esophagus without overt lesion. Friable lesion seen in very proximal cardia, seemingly extending from distal margin of GE junction. The lesion encompasses about 50% of the circumference of the lumen of the cardia. The lesion appears to invade into, but not through, the muscularis propria. There is a round hypoechoic well-defined malignant-appearing lymph node in the immediate vicinity of the mass; I did not feel the benefits of FNA outweighed the risk, as the needle would have to traverse the cardia lesion in order to reach the lymph node. Celiac axis and left lobe of liver appear grossly normal. IMPRESSION: As above. Locoregional EUS staging of GE junction adenocarcinoma, post-neoadjuvant therapy, would be T2 N1 Mx.  Recent Lab Findings: Lab Results  Component Value Date   WBC 2.9* 12/31/2013   HGB 11.9* 12/31/2013   HCT 36.0* 12/31/2013   PLT 155 12/31/2013   GLUCOSE 130 12/31/2013   CHOL 167 08/07/2013   TRIG 165* 08/07/2013   HDL 52 08/07/2013   LDLDIRECT 151.6 07/25/2011   LDLCALC 82 08/07/2013   ALT 22 12/31/2013   AST 30 12/31/2013   NA 142 12/31/2013   K 3.6 12/31/2013   CL 107 10/17/2013   CREATININE 1.3 12/31/2013   BUN 28.8* 12/31/2013   CO2 23 12/31/2013   TSH 2.581 05/01/2013   INR 1.08 08/06/2013    HGBA1C 6.2* 08/07/2013   Overall Impression: Intermediate risk stress nuclear study with a small area (6%) of anterior reversible ischemia.  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 Now s/p radiation and chemo. Plan to proceed with resection with combined approach with general surgery. Pateint is aware if necessary we will extend resection into chest or neck with cervical anastomosis . He is aware he will need a feed j tube. Risks and options have been discussed with him in detail. Will  Coordinate with Dr Carlye Grippe office and tentative proceed September 11  Grace Isaac MD      [address] [place] [address] Office [phone]   Beeper [phone]  03/30/2014 12:26 PM

## 2014-03-28 ENCOUNTER — Other Ambulatory Visit: Payer: Self-pay

## 2014-03-28 DIAGNOSIS — C159 Malignant neoplasm of esophagus, unspecified: Secondary | ICD-10-CM

## 2014-04-03 ENCOUNTER — Telehealth: Payer: Self-pay

## 2014-04-03 NOTE — Telephone Encounter (Signed)
Charles [name] [phone]  Charles [name] would like for Dr Larose Kells to call and talk to him about [facility] on [address]

## 2014-04-03 NOTE — Progress Notes (Signed)
Loop recorder 

## 2014-04-03 NOTE — Telephone Encounter (Signed)
Please see phone note below.

## 2014-04-03 NOTE — Telephone Encounter (Signed)
Spoke with the patient, he is scheduled for surgery 04/21/2014, at the same time he is thinking about send all the information (x-rays, labs, CTs)  to M.D. Ouida Sills in [facility] 40 doctors over there to give him an opinion. We agreed that he will call me and let me know where  to send  all the information.

## 2014-04-07 ENCOUNTER — Encounter (HOSPITAL_COMMUNITY): Payer: Self-pay | Admitting: Pharmacy Technician

## 2014-04-09 ENCOUNTER — Other Ambulatory Visit (HOSPITAL_COMMUNITY): Payer: Self-pay | Admitting: *Deleted

## 2014-04-09 NOTE — Pre-Procedure Instructions (Signed)
Charles [name]  04/09/2014   Your procedure is scheduled on:  Friday, April 18, 2014 at 7:30 AM.   Report to [facility] Entrance "A" Admitting Office at 5:30 AM.   Call this number if you have problems the morning of surgery: [phone]   Remember:   Do not eat food or drink liquids after midnight Thursday, 04/17/14.   Take these medicines the morning of surgery with A SIP OF WATER: isosorbide mononitrate (IMDUR), metoprolol tartrate (LOPRESSOR), nitroGLYCERIN (NITROSTAT) - if needed  Stop Aspirin and Vitamins as of tomorrow.    Do not wear jewelry.  Do not wear lotions, powders, or cologne. You may wear deodorant.  Men may shave face and neck.  Do not bring valuables to the hospital.  [facility] is not responsible                  for any belongings or valuables.               Contacts, dentures or bridgework may not be worn into surgery.  Leave suitcase in the car. After surgery it may be brought to your room.  For patients admitted to the hospital, discharge time is determined by your                treatment team.               Please read over the following fact sheets that you were given: Pain Booklet, Coughing and Deep Breathing, Blood Transfusion Information, MRSA Information and Surgical Site Infection Prevention

## 2014-04-10 ENCOUNTER — Encounter (HOSPITAL_COMMUNITY)
Admission: RE | Admit: 2014-04-10 | Discharge: 2014-04-10 | Disposition: A | Discharge status: Home / Self Care | Payer: Medicare Other | Source: Ambulatory Visit | Attending: Surgery | Admitting: Surgery

## 2014-04-10 ENCOUNTER — Encounter (HOSPITAL_COMMUNITY): Payer: Self-pay

## 2014-04-10 VITALS — BP 168/69 | HR 64 | Temp 97.6°F | Resp 18 | Ht 71.0 in | Wt 179.1 lb

## 2014-04-10 DIAGNOSIS — E119 Type 2 diabetes mellitus without complications: Secondary | ICD-10-CM | POA: Insufficient documentation

## 2014-04-10 DIAGNOSIS — Z951 Presence of aortocoronary bypass graft: Secondary | ICD-10-CM | POA: Insufficient documentation

## 2014-04-10 DIAGNOSIS — I10 Essential (primary) hypertension: Secondary | ICD-10-CM | POA: Diagnosis not present

## 2014-04-10 DIAGNOSIS — I441 Atrioventricular block, second degree: Secondary | ICD-10-CM | POA: Insufficient documentation

## 2014-04-10 DIAGNOSIS — I2510 Atherosclerotic heart disease of native coronary artery without angina pectoris: Secondary | ICD-10-CM | POA: Insufficient documentation

## 2014-04-10 DIAGNOSIS — Z8673 Personal history of transient ischemic attack (TIA), and cerebral infarction without residual deficits: Secondary | ICD-10-CM | POA: Insufficient documentation

## 2014-04-10 DIAGNOSIS — C159 Malignant neoplasm of esophagus, unspecified: Secondary | ICD-10-CM | POA: Insufficient documentation

## 2014-04-10 DIAGNOSIS — E785 Hyperlipidemia, unspecified: Secondary | ICD-10-CM | POA: Diagnosis not present

## 2014-04-10 DIAGNOSIS — Z01818 Encounter for other preprocedural examination: Secondary | ICD-10-CM | POA: Insufficient documentation

## 2014-04-10 DIAGNOSIS — Z9861 Coronary angioplasty status: Secondary | ICD-10-CM | POA: Diagnosis not present

## 2014-04-10 HISTORY — DX: Cardiac arrhythmia, unspecified: I49.9

## 2014-04-10 LAB — COMPREHENSIVE METABOLIC PANEL
ALT: 17 U/L (ref 0–53)
AST: 24 U/L (ref 0–37)
Albumin: 3.6 g/dL (ref 3.5–5.2)
Alkaline Phosphatase: 68 U/L (ref 39–117)
Anion gap: 15 (ref 5–15)
BUN: 29 mg/dL — ABNORMAL HIGH (ref 6–23)
CO2: 22 mEq/L (ref 19–32)
Calcium: 9.2 mg/dL (ref 8.4–10.5)
Chloride: 107 mEq/L (ref 96–112)
Creatinine, Ser: 1.17 mg/dL (ref 0.50–1.35)
GFR calc Af Amer: 68 mL/min — ABNORMAL LOW (ref >90)
GFR calc non Af Amer: 58 mL/min — ABNORMAL LOW (ref >90)
Glucose, Bld: 119 mg/dL — ABNORMAL HIGH (ref 70–99)
Potassium: 4.2 mEq/L (ref 3.7–5.3)
Sodium: 144 mEq/L (ref 137–147)
Total Bilirubin: 0.5 mg/dL (ref 0.3–1.2)
Total Protein: 6.8 g/dL (ref 6.0–8.3)

## 2014-04-10 LAB — PROTIME-INR
INR: 1.08 (ref 0.00–1.49)
Prothrombin Time: 14 seconds (ref 11.6–15.2)

## 2014-04-10 LAB — CBC
HCT: 41.1 % (ref 39.0–52.0)
Hemoglobin: 13.4 g/dL (ref 13.0–17.0)
MCH: 31.9 pg (ref 26.0–34.0)
MCHC: 32.6 g/dL (ref 30.0–36.0)
MCV: 97.9 fL (ref 78.0–100.0)
Platelets: 160 10*3/uL (ref 150–400)
RBC: 4.2 MIL/uL — ABNORMAL LOW (ref 4.22–5.81)
RDW: 13.1 % (ref 11.5–15.5)
WBC: 4.8 10*3/uL (ref 4.0–10.5)

## 2014-04-10 LAB — APTT: aPTT: 29 seconds (ref 24–37)

## 2014-04-10 LAB — URINALYSIS, ROUTINE W REFLEX MICROSCOPIC
Bilirubin Urine: NEGATIVE
Glucose, UA: NEGATIVE mg/dL
Hgb urine dipstick: NEGATIVE
Ketones, ur: NEGATIVE mg/dL
Leukocytes, UA: NEGATIVE
Nitrite: NEGATIVE
Protein, ur: NEGATIVE mg/dL
Specific Gravity, Urine: 1.019 (ref 1.005–1.030)
Urobilinogen, UA: 0.2 mg/dL (ref 0.0–1.0)
pH: 6 (ref 5.0–8.0)

## 2014-04-10 LAB — SURGICAL PCR SCREEN
MRSA, PCR: NEGATIVE
Staphylococcus aureus: POSITIVE — AB

## 2014-04-10 NOTE — Progress Notes (Addendum)
Anesthesia Chart Review:  Pt is 79 year old male scheduled for esophagogastrectomy, possible transhiatal esophagectomy, possible right thoracotomy on 04/24/2014 with Dr. Hassell Done and Dr. Servando Snare.    PMH: Adenocarcinoma of the GE junction 2015 s/p chemoradiation, diet controlled DM2, HTN, HLD, CAD s/p CABG X 5 (LIMA to LAD, sequential SVG to RAMUS INT and OM1, sequential SVG to distal RCA and PDA) 01/21/13 with staged DES to RAMUS INT for graft occlusion 05/06/13 followed by DES to RCA 06/07/13 (off dual anti-platelet therapy in 10/2013 due to GI bleed), CVA 1995, TIA 07/2013 with loop recorder implanted 08/23/13 to evaluate for afib.  Cardiology notes indicate that 10/2013 interrogation showed prolonged episodes of second degree AV block as well as one episode of 14 seconds of asystole, probably related to complete heart block. Fortunately episodes were asymptomatic. Permanent pacemaker was considered but his diagnosis of esophageal cancer to the precedence.  Medications include: ASA, isosorbide mononitrate, metoprolol, nitroglycerine, losartan, finasteride, pravastatin.  PCP Dr. Larose Kells.  HEM-ONC Dr. Benay Spice. Cardiologist Dr. Sallyanne Kuster. Note from Dr. Sallyanne Kuster on Nuclear Med study notes 03/06/14: "Nuclear study shows a very small abnormality, expected based on his known coronary anatomy. No further cardiac workup planned before esophageal surgery. Low to moderate surgical risk."  EKG on 04/10/14: Normal sinus rhythm, Left axis deviation, Right bundle branch block, Cannot rule out Inferior infarct.  Stable when compared to 01/09/14 EKG.  Nuclear Med study 03/06/14: -Intermediate risk stress nuclear study with a small area (6%) of anterior reversible ischemia. -LV Wall Motion: NL LV Function; NL Wall Motion; EF 55%  Echo 08/07/13:  - Left ventricle: The cavity size was normal. There was mild focal basal hypertrophy of the septum, with an appearance suggesting concentric remodeling (increased wall thickness with normal  wall mass). Systolic function was normal. The estimated ejection fraction was in the range of 55% to 65%. Wall motion was normal; there were no regional wall motion abnormalities. Features are consistent with a pseudonormal left ventricular filling pattern, with concomitant abnormal relaxation and increased filling pressure (grade 2 diastolic dysfunction). Doppler parameters are consistent with elevated mean left atrial filling pressure. - Mitral valve: Mildly calcified annulus. - Left atrium: The atrium was mildly dilated. - Right atrium: The atrium was mildly dilated. - Atrial septum: No defect or patent foramen ovale was identified.  His last cardiac cath was on 06/07/13.  See report under Notes tabs, but summarized by Dr. Sallyanne Kuster as showing: Widely patent recently placed stent in the ramus intermedius with no notable changes in the left coronary system. Widely patent graft to the right system with diffuse disease in the proximal and mid/distal native RCA. Moderate to severe bifurcation disease of the distal RCA that involves the ostial RPDA as well as the ostial and proximal RPAV with more severe downstream disease in the RPDA. Successful single site intervention on the distal RPDA with a Promus Premier DES 2.25 x 24 with excellent TIMI-3 flow now to a prominent PDA that reaches all the way to the apex.   Chest x-ray 01/17/14 reviewed. No active cardiopulmonary disease. He is actually scheduled for a CXR on the day of surgery (to be within 72 hours per surgeon orders).  Preoperative labs reviewed.  Glucose 119, BUN 29. He requested T&S be done on the day of surgery.  ABG at PAT was a venous stick, so it will also be done on the day of surgery.  Patient has a significant cardiac history, but clearance by his cardiologist.  He had high  degree heart block in 10/2013.  Could consider placing pacing pads preoperatively.  Will defer to his assigned anesthesiologist.  Myra Gianotti, PA-C [facility] Short Stay  Center/Anesthesiology Phone [phone] 04/10/2014 6:00 PM

## 2014-04-10 NOTE — Progress Notes (Signed)
Blood sample received in dept @1005 , sample is venous blood and not arterial.  Phlebotomist [phone] made aware.

## 2014-04-17 ENCOUNTER — Encounter (HOSPITAL_COMMUNITY): Payer: Self-pay | Admitting: Certified Registered Nurse Anesthetist

## 2014-04-17 ENCOUNTER — Encounter: Payer: Self-pay | Admitting: Cardiovascular Disease

## 2014-04-17 MED ORDER — DEXTROSE 5 % IV SOLN
1.5000 g | INTRAVENOUS | Status: AC
  Administered 2014-04-18: 1.5 g via INTRAVENOUS
  Filled 2014-04-17: qty 1.5

## 2014-04-18 ENCOUNTER — Inpatient Hospital Stay (HOSPITAL_COMMUNITY): Payer: Medicare Other | Admitting: Certified Registered Nurse Anesthetist

## 2014-04-18 ENCOUNTER — Inpatient Hospital Stay (HOSPITAL_COMMUNITY): Payer: Medicare Other

## 2014-04-18 ENCOUNTER — Inpatient Hospital Stay (HOSPITAL_COMMUNITY)
Admission: RE | Admit: 2014-04-18 | Discharge: 2014-05-08 | DRG: 326 | Disposition: E | Payer: Medicare Other | Source: Ambulatory Visit | Attending: Cardiothoracic Surgery | Admitting: Cardiothoracic Surgery

## 2014-04-18 ENCOUNTER — Encounter (HOSPITAL_COMMUNITY): Payer: Medicare Other | Admitting: Emergency Medicine

## 2014-04-18 ENCOUNTER — Encounter: Payer: Self-pay | Admitting: Radiation Oncology

## 2014-04-18 ENCOUNTER — Encounter (HOSPITAL_COMMUNITY): Payer: Self-pay | Admitting: *Deleted

## 2014-04-18 ENCOUNTER — Ambulatory Visit: Admit: 2014-04-18 | Payer: Self-pay | Admitting: Surgery

## 2014-04-18 ENCOUNTER — Encounter (HOSPITAL_COMMUNITY): Admission: RE | Disposition: E | Payer: Self-pay | Source: Ambulatory Visit | Attending: Cardiothoracic Surgery

## 2014-04-18 DIAGNOSIS — N179 Acute kidney failure, unspecified: Secondary | ICD-10-CM | POA: Diagnosis not present

## 2014-04-18 DIAGNOSIS — I472 Ventricular tachycardia, unspecified: Secondary | ICD-10-CM | POA: Diagnosis present

## 2014-04-18 DIAGNOSIS — Z955 Presence of coronary angioplasty implant and graft: Secondary | ICD-10-CM

## 2014-04-18 DIAGNOSIS — I4891 Unspecified atrial fibrillation: Secondary | ICD-10-CM | POA: Diagnosis present

## 2014-04-18 DIAGNOSIS — R579 Shock, unspecified: Secondary | ICD-10-CM | POA: Diagnosis not present

## 2014-04-18 DIAGNOSIS — E872 Acidosis, unspecified: Secondary | ICD-10-CM | POA: Diagnosis not present

## 2014-04-18 DIAGNOSIS — Z923 Personal history of irradiation: Secondary | ICD-10-CM

## 2014-04-18 DIAGNOSIS — J9600 Acute respiratory failure, unspecified whether with hypoxia or hypercapnia: Secondary | ICD-10-CM | POA: Diagnosis not present

## 2014-04-18 DIAGNOSIS — I5033 Acute on chronic diastolic (congestive) heart failure: Secondary | ICD-10-CM

## 2014-04-18 DIAGNOSIS — K2270 Barrett's esophagus without dysplasia: Secondary | ICD-10-CM | POA: Diagnosis present

## 2014-04-18 DIAGNOSIS — I441 Atrioventricular block, second degree: Secondary | ICD-10-CM

## 2014-04-18 DIAGNOSIS — I480 Paroxysmal atrial fibrillation: Secondary | ICD-10-CM

## 2014-04-18 DIAGNOSIS — Z7982 Long term (current) use of aspirin: Secondary | ICD-10-CM | POA: Diagnosis not present

## 2014-04-18 DIAGNOSIS — G9340 Encephalopathy, unspecified: Secondary | ICD-10-CM

## 2014-04-18 DIAGNOSIS — N183 Chronic kidney disease, stage 3 unspecified: Secondary | ICD-10-CM

## 2014-04-18 DIAGNOSIS — Z9221 Personal history of antineoplastic chemotherapy: Secondary | ICD-10-CM

## 2014-04-18 DIAGNOSIS — H3530 Unspecified macular degeneration: Secondary | ICD-10-CM | POA: Diagnosis present

## 2014-04-18 DIAGNOSIS — Z8673 Personal history of transient ischemic attack (TIA), and cerebral infarction without residual deficits: Secondary | ICD-10-CM | POA: Diagnosis not present

## 2014-04-18 DIAGNOSIS — F29 Unspecified psychosis not due to a substance or known physiological condition: Secondary | ICD-10-CM | POA: Diagnosis not present

## 2014-04-18 DIAGNOSIS — E8779 Other fluid overload: Secondary | ICD-10-CM | POA: Diagnosis not present

## 2014-04-18 DIAGNOSIS — I454 Nonspecific intraventricular block: Secondary | ICD-10-CM | POA: Diagnosis not present

## 2014-04-18 DIAGNOSIS — Z833 Family history of diabetes mellitus: Secondary | ICD-10-CM | POA: Diagnosis not present

## 2014-04-18 DIAGNOSIS — Z9861 Coronary angioplasty status: Secondary | ICD-10-CM | POA: Diagnosis not present

## 2014-04-18 DIAGNOSIS — E876 Hypokalemia: Secondary | ICD-10-CM | POA: Diagnosis not present

## 2014-04-18 DIAGNOSIS — E785 Hyperlipidemia, unspecified: Secondary | ICD-10-CM | POA: Diagnosis present

## 2014-04-18 DIAGNOSIS — I25708 Atherosclerosis of coronary artery bypass graft(s), unspecified, with other forms of angina pectoris: Secondary | ICD-10-CM

## 2014-04-18 DIAGNOSIS — K219 Gastro-esophageal reflux disease without esophagitis: Secondary | ICD-10-CM | POA: Diagnosis present

## 2014-04-18 DIAGNOSIS — I10 Essential (primary) hypertension: Secondary | ICD-10-CM | POA: Diagnosis present

## 2014-04-18 DIAGNOSIS — D649 Anemia, unspecified: Secondary | ICD-10-CM | POA: Diagnosis not present

## 2014-04-18 DIAGNOSIS — Z87891 Personal history of nicotine dependence: Secondary | ICD-10-CM | POA: Diagnosis not present

## 2014-04-18 DIAGNOSIS — C160 Malignant neoplasm of cardia: Secondary | ICD-10-CM

## 2014-04-18 DIAGNOSIS — E871 Hypo-osmolality and hyponatremia: Secondary | ICD-10-CM | POA: Diagnosis not present

## 2014-04-18 DIAGNOSIS — H9190 Unspecified hearing loss, unspecified ear: Secondary | ICD-10-CM | POA: Diagnosis present

## 2014-04-18 DIAGNOSIS — H3500 Unspecified background retinopathy: Secondary | ICD-10-CM | POA: Diagnosis present

## 2014-04-18 DIAGNOSIS — I4729 Other ventricular tachycardia: Secondary | ICD-10-CM | POA: Diagnosis present

## 2014-04-18 DIAGNOSIS — J690 Pneumonitis due to inhalation of food and vomit: Secondary | ICD-10-CM | POA: Diagnosis not present

## 2014-04-18 DIAGNOSIS — J90 Pleural effusion, not elsewhere classified: Secondary | ICD-10-CM | POA: Diagnosis not present

## 2014-04-18 DIAGNOSIS — C155 Malignant neoplasm of lower third of esophagus: Secondary | ICD-10-CM | POA: Diagnosis not present

## 2014-04-18 DIAGNOSIS — I498 Other specified cardiac arrhythmias: Secondary | ICD-10-CM | POA: Diagnosis present

## 2014-04-18 DIAGNOSIS — I469 Cardiac arrest, cause unspecified: Secondary | ICD-10-CM | POA: Diagnosis not present

## 2014-04-18 DIAGNOSIS — G458 Other transient cerebral ischemic attacks and related syndromes: Secondary | ICD-10-CM

## 2014-04-18 DIAGNOSIS — Z8249 Family history of ischemic heart disease and other diseases of the circulatory system: Secondary | ICD-10-CM

## 2014-04-18 DIAGNOSIS — J9819 Other pulmonary collapse: Secondary | ICD-10-CM | POA: Diagnosis not present

## 2014-04-18 DIAGNOSIS — J9601 Acute respiratory failure with hypoxia: Secondary | ICD-10-CM

## 2014-04-18 DIAGNOSIS — I2510 Atherosclerotic heart disease of native coronary artery without angina pectoris: Secondary | ICD-10-CM | POA: Diagnosis present

## 2014-04-18 DIAGNOSIS — Z801 Family history of malignant neoplasm of trachea, bronchus and lung: Secondary | ICD-10-CM

## 2014-04-18 DIAGNOSIS — K660 Peritoneal adhesions (postprocedural) (postinfection): Secondary | ICD-10-CM | POA: Diagnosis present

## 2014-04-18 DIAGNOSIS — E119 Type 2 diabetes mellitus without complications: Secondary | ICD-10-CM | POA: Diagnosis present

## 2014-04-18 DIAGNOSIS — E8770 Fluid overload, unspecified: Secondary | ICD-10-CM | POA: Diagnosis not present

## 2014-04-18 DIAGNOSIS — Z85028 Personal history of other malignant neoplasm of stomach: Secondary | ICD-10-CM

## 2014-04-18 DIAGNOSIS — R404 Transient alteration of awareness: Secondary | ICD-10-CM | POA: Diagnosis not present

## 2014-04-18 DIAGNOSIS — Z951 Presence of aortocoronary bypass graft: Secondary | ICD-10-CM | POA: Diagnosis not present

## 2014-04-18 DIAGNOSIS — C159 Malignant neoplasm of esophagus, unspecified: Secondary | ICD-10-CM

## 2014-04-18 DIAGNOSIS — I25810 Atherosclerosis of coronary artery bypass graft(s) without angina pectoris: Secondary | ICD-10-CM | POA: Diagnosis present

## 2014-04-18 DIAGNOSIS — I509 Heart failure, unspecified: Secondary | ICD-10-CM | POA: Diagnosis present

## 2014-04-18 HISTORY — PX: LAPAROSCOPIC LYSIS OF ADHESIONS: SHX5905

## 2014-04-18 HISTORY — PX: LAPAROSCOPIC GASTRIC RESECTION: SHX1936

## 2014-04-18 HISTORY — PX: JEJUNOSTOMY: SHX313

## 2014-04-18 LAB — POCT I-STAT 3, ART BLOOD GAS (G3+)
ACID-BASE DEFICIT: 3 mmol/L — AB (ref 0.0–2.0)
BICARBONATE: 23.5 meq/L (ref 20.0–24.0)
BICARBONATE: 22.3 meq/L (ref 20.0–24.0)
O2 Saturation: 98 %
O2 Saturation: 97 %
PH ART: 7.367 (ref 7.350–7.450)
PO2 ART: 95 mmHg (ref 80.0–100.0)
Patient temperature: 98
Patient temperature: 37
TCO2: 25 mmol/L (ref 0–100)
TCO2: 23 mmol/L (ref 0–100)
pCO2 arterial: 33.5 mmHg — ABNORMAL LOW (ref 35.0–45.0)
pCO2 arterial: 38.9 mmHg (ref 35.0–45.0)
pH, Arterial: 7.453 — ABNORMAL HIGH (ref 7.350–7.450)
pO2, Arterial: 104 mmHg — ABNORMAL HIGH (ref 80.0–100.0)

## 2014-04-18 LAB — BASIC METABOLIC PANEL
Anion gap: 11 (ref 5–15)
BUN: 25 mg/dL — ABNORMAL HIGH (ref 6–23)
CO2: 23 mEq/L (ref 19–32)
Calcium: 8.3 mg/dL — ABNORMAL LOW (ref 8.4–10.5)
Chloride: 105 mEq/L (ref 96–112)
Creatinine, Ser: 1.2 mg/dL (ref 0.50–1.35)
GFR calc Af Amer: 66 mL/min — ABNORMAL LOW (ref >90)
GFR calc non Af Amer: 57 mL/min — ABNORMAL LOW (ref >90)
Glucose, Bld: 151 mg/dL — ABNORMAL HIGH (ref 70–99)
Potassium: 4.6 mEq/L (ref 3.7–5.3)
Sodium: 139 mEq/L (ref 137–147)

## 2014-04-18 LAB — POCT I-STAT 7, (LYTES, BLD GAS, ICA,H+H)
ACID-BASE EXCESS: 3 mmol/L — AB (ref 0.0–2.0)
BICARBONATE: 26.2 meq/L — AB (ref 20.0–24.0)
Bicarbonate: 25.1 mEq/L — ABNORMAL HIGH (ref 20.0–24.0)
CALCIUM ION: 1.26 mmol/L (ref 1.13–1.30)
Calcium, Ion: 1.26 mmol/L (ref 1.13–1.30)
HCT: 32 % — ABNORMAL LOW (ref 39.0–52.0)
HCT: 35 % — ABNORMAL LOW (ref 39.0–52.0)
Hemoglobin: 10.9 g/dL — ABNORMAL LOW (ref 13.0–17.0)
Hemoglobin: 11.9 g/dL — ABNORMAL LOW (ref 13.0–17.0)
O2 Saturation: 100 %
O2 Saturation: 100 %
PH ART: 7.393 (ref 7.350–7.450)
Patient temperature: 36
Potassium: 4.1 mEq/L (ref 3.7–5.3)
Potassium: 4.7 mEq/L (ref 3.7–5.3)
Sodium: 140 mEq/L (ref 137–147)
Sodium: 139 mEq/L (ref 137–147)
TCO2: 27 mmol/L (ref 0–100)
TCO2: 26 mmol/L (ref 0–100)
pCO2 arterial: 34.7 mmHg — ABNORMAL LOW (ref 35.0–45.0)
pCO2 arterial: 40.7 mmHg (ref 35.0–45.0)
pH, Arterial: 7.482 — ABNORMAL HIGH (ref 7.350–7.450)
pO2, Arterial: 279 mmHg — ABNORMAL HIGH (ref 80.0–100.0)
pO2, Arterial: 194 mmHg — ABNORMAL HIGH (ref 80.0–100.0)

## 2014-04-18 LAB — MDC_IDC_ENUM_SESS_TYPE_REMOTE

## 2014-04-18 LAB — BLOOD GAS, ARTERIAL
ACID-BASE EXCESS: 1.1 mmol/L (ref 0.0–2.0)
Bicarbonate: 24.8 mEq/L — ABNORMAL HIGH (ref 20.0–24.0)
DRAWN BY: 252031
FIO2: 0.21 %
O2 SAT: 98 %
PCO2 ART: 36.4 mmHg (ref 35.0–45.0)
Patient temperature: 98.6
TCO2: 25.9 mmol/L (ref 0–100)
pH, Arterial: 7.447 (ref 7.350–7.450)
pO2, Arterial: 100 mmHg (ref 80.0–100.0)

## 2014-04-18 LAB — CBC
HCT: 34.4 % — ABNORMAL LOW (ref 39.0–52.0)
Hemoglobin: 11.4 g/dL — ABNORMAL LOW (ref 13.0–17.0)
MCH: 32.6 pg (ref 26.0–34.0)
MCHC: 33.1 g/dL (ref 30.0–36.0)
MCV: 98.3 fL (ref 78.0–100.0)
Platelets: 121 10*3/uL — ABNORMAL LOW (ref 150–400)
RBC: 3.5 MIL/uL — ABNORMAL LOW (ref 4.22–5.81)
RDW: 13.2 % (ref 11.5–15.5)
WBC: 8.1 10*3/uL (ref 4.0–10.5)

## 2014-04-18 LAB — TYPE AND SCREEN
ABO/RH(D): O POS
ANTIBODY SCREEN: NEGATIVE

## 2014-04-18 LAB — GLUCOSE, CAPILLARY: GLUCOSE-CAPILLARY: 110 mg/dL — AB (ref 70–99)

## 2014-04-18 LAB — POCT I-STAT GLUCOSE
GLUCOSE: 162 mg/dL — AB (ref 70–99)
Operator id: 320841

## 2014-04-18 SURGERY — GASTRECTOMY, LAPAROSCOPIC
Anesthesia: General

## 2014-04-18 SURGERY — LAPAROSCOPIC GASTRIC RESECTION
Anesthesia: General | Site: Abdomen

## 2014-04-18 MED ORDER — DIPHENHYDRAMINE HCL 12.5 MG/5ML PO ELIX
12.5000 mg | ORAL_SOLUTION | Freq: Four times a day (QID) | ORAL | Status: DC | PRN
  Filled 2014-04-18: qty 5

## 2014-04-18 MED ORDER — NALOXONE HCL 0.4 MG/ML IJ SOLN: 0.4000 mg | INTRAMUSCULAR | Status: DC | PRN

## 2014-04-18 MED ORDER — NITROGLYCERIN IN D5W 200-5 MCG/ML-% IV SOLN
INTRAVENOUS | Status: AC
  Administered 2014-04-18: 20 ug/min
  Filled 2014-04-18: qty 250

## 2014-04-18 MED ORDER — FENTANYL CITRATE 0.05 MG/ML IJ SOLN
INTRAMUSCULAR | Status: AC
  Filled 2014-04-18: qty 5

## 2014-04-18 MED ORDER — PROPOFOL 10 MG/ML IV BOLUS
INTRAVENOUS | Status: AC
  Filled 2014-04-18: qty 20

## 2014-04-18 MED ORDER — LACTATED RINGERS IV SOLN
INTRAVENOUS | Status: DC | PRN
  Administered 2014-04-18: 08:00:00 via INTRAVENOUS

## 2014-04-18 MED ORDER — DEXTROSE 5 % IV SOLN
2.0000 g | Freq: Four times a day (QID) | INTRAVENOUS | Status: AC
  Administered 2014-04-18 – 2014-04-19 (×4): 2 g via INTRAVENOUS
  Filled 2014-04-18 (×4): qty 2

## 2014-04-18 MED ORDER — ONDANSETRON HCL 4 MG/2ML IJ SOLN
4.0000 mg | INTRAMUSCULAR | Status: DC | PRN
Start: 2014-04-18 — End: 2014-04-25
  Administered 2014-04-19 – 2014-04-21 (×4): 4 mg via INTRAVENOUS
  Filled 2014-04-18 (×4): qty 2

## 2014-04-18 MED ORDER — VECURONIUM BROMIDE 10 MG IV SOLR
INTRAVENOUS | Status: DC | PRN
  Administered 2014-04-18: 2 mg via INTRAVENOUS
  Administered 2014-04-18 (×2): 1 mg via INTRAVENOUS
  Administered 2014-04-18 (×2): 2 mg via INTRAVENOUS
  Administered 2014-04-18: 3 mg via INTRAVENOUS
  Administered 2014-04-18: 2 mg via INTRAVENOUS
  Administered 2014-04-18 (×3): 1 mg via INTRAVENOUS
  Administered 2014-04-18: 2 mg via INTRAVENOUS

## 2014-04-18 MED ORDER — HYDROMORPHONE HCL PF 1 MG/ML IJ SOLN
INTRAMUSCULAR | Status: AC
  Filled 2014-04-18: qty 1

## 2014-04-18 MED ORDER — NITROGLYCERIN IN D5W 200-5 MCG/ML-% IV SOLN
2.0000 ug/min | INTRAVENOUS | Status: DC
  Administered 2014-04-19: 75 ug/min via INTRAVENOUS
  Administered 2014-04-19: 90 ug/min via INTRAVENOUS
  Administered 2014-04-20: 50 ug/min via INTRAVENOUS
  Filled 2014-04-18 (×4): qty 250

## 2014-04-18 MED ORDER — NITROGLYCERIN 0.2 MG/ML ON CALL CATH LAB
INTRAVENOUS | Status: DC | PRN
  Administered 2014-04-18: 60 ug via INTRAVENOUS

## 2014-04-18 MED ORDER — SODIUM CHLORIDE 0.9 % IJ SOLN
10.0000 mL | Freq: Two times a day (BID) | INTRAMUSCULAR | Status: DC
  Administered 2014-04-19 – 2014-04-24 (×9): 10 mL via INTRAVENOUS

## 2014-04-18 MED ORDER — HYDROMORPHONE HCL PF 1 MG/ML IJ SOLN
0.2500 mg | INTRAMUSCULAR | Status: DC | PRN
  Administered 2014-04-18 – 2014-04-19 (×2): 0.5 mg via INTRAVENOUS
  Filled 2014-04-18: qty 1

## 2014-04-18 MED ORDER — ACETAMINOPHEN 10 MG/ML IV SOLN
1000.0000 mg | Freq: Four times a day (QID) | INTRAVENOUS | Status: AC
  Administered 2014-04-18 – 2014-04-19 (×3): 1000 mg via INTRAVENOUS
  Filled 2014-04-18 (×5): qty 100

## 2014-04-18 MED ORDER — POTASSIUM CHLORIDE 10 MEQ/50ML IV SOLN
10.0000 meq | INTRAVENOUS | Status: DC | PRN
  Administered 2014-04-23 – 2014-04-24 (×5): 10 meq via INTRAVENOUS
  Filled 2014-04-18 (×5): qty 50

## 2014-04-18 MED ORDER — SUFENTANIL CITRATE 50 MCG/ML IV SOLN
50.0000 ug | INTRAVENOUS | Status: DC | PRN
  Administered 2014-04-18: .1 ug/kg/h via INTRAVENOUS

## 2014-04-18 MED ORDER — FENTANYL CITRATE 0.05 MG/ML IJ SOLN
INTRAMUSCULAR | Status: DC | PRN
  Administered 2014-04-18 (×3): 50 ug via INTRAVENOUS
  Administered 2014-04-18: 150 ug via INTRAVENOUS
  Administered 2014-04-18 (×9): 50 ug via INTRAVENOUS

## 2014-04-18 MED ORDER — ROCURONIUM BROMIDE 100 MG/10ML IV SOLN
INTRAVENOUS | Status: DC | PRN
  Administered 2014-04-18: 50 mg via INTRAVENOUS

## 2014-04-18 MED ORDER — MIDAZOLAM HCL 2 MG/2ML IJ SOLN
INTRAMUSCULAR | Status: AC
  Filled 2014-04-18: qty 2

## 2014-04-18 MED ORDER — HYDROMORPHONE HCL PF 1 MG/ML IJ SOLN
INTRAMUSCULAR | Status: DC | PRN
  Administered 2014-04-18 (×2): .5 mg via INTRAVENOUS

## 2014-04-18 MED ORDER — MUPIROCIN 2 % EX OINT
1.0000 "application " | TOPICAL_OINTMENT | Freq: Two times a day (BID) | CUTANEOUS | Status: AC
  Administered 2014-04-18 – 2014-04-23 (×10): 1 via NASAL
  Filled 2014-04-18: qty 22

## 2014-04-18 MED ORDER — ONDANSETRON HCL 4 MG/2ML IJ SOLN
INTRAMUSCULAR | Status: AC
  Filled 2014-04-18: qty 2

## 2014-04-18 MED ORDER — CETYLPYRIDINIUM CHLORIDE 0.05 % MT LIQD
7.0000 mL | Freq: Four times a day (QID) | OROMUCOSAL | Status: DC
  Administered 2014-04-18 – 2014-04-25 (×26): 7 mL via OROMUCOSAL

## 2014-04-18 MED ORDER — INFLUENZA VAC SPLIT QUAD 0.5 ML IM SUSY
0.5000 mL | PREFILLED_SYRINGE | INTRAMUSCULAR | Status: DC
Start: 2014-04-19 — End: 2014-04-19

## 2014-04-18 MED ORDER — ALBUTEROL SULFATE (2.5 MG/3ML) 0.083% IN NEBU
2.5000 mg | INHALATION_SOLUTION | Freq: Four times a day (QID) | RESPIRATORY_TRACT | Status: AC
  Administered 2014-04-18 – 2014-04-20 (×5): 2.5 mg via RESPIRATORY_TRACT
  Filled 2014-04-18 (×6): qty 3

## 2014-04-18 MED ORDER — CHLORHEXIDINE GLUCONATE CLOTH 2 % EX PADS
6.0000 | MEDICATED_PAD | Freq: Every day | CUTANEOUS | Status: AC
  Administered 2014-04-19 – 2014-04-23 (×5): 6 via TOPICAL

## 2014-04-18 MED ORDER — KCL IN DEXTROSE-NACL 20-5-0.45 MEQ/L-%-% IV SOLN
INTRAVENOUS | Status: DC
  Administered 2014-04-18 – 2014-04-19 (×2): 100 mL/h via INTRAVENOUS
  Administered 2014-04-19: 04:00:00 via INTRAVENOUS
  Administered 2014-04-20: 100 mL/h via INTRAVENOUS
  Administered 2014-04-20: 02:00:00 via INTRAVENOUS
  Administered 2014-04-21: 75 mL/h via INTRAVENOUS
  Filled 2014-04-18 (×9): qty 1000

## 2014-04-18 MED ORDER — ONDANSETRON HCL 4 MG/2ML IJ SOLN: 4.0000 mg | Freq: Four times a day (QID) | INTRAMUSCULAR | Status: DC | PRN

## 2014-04-18 MED ORDER — FENTANYL 10 MCG/ML IV SOLN
INTRAVENOUS | Status: DC
  Administered 2014-04-19: 285 ug via INTRAVENOUS
  Administered 2014-04-19: 17:00:00 via INTRAVENOUS
  Filled 2014-04-18 (×2): qty 50

## 2014-04-18 MED ORDER — METOPROLOL TARTRATE 1 MG/ML IV SOLN
INTRAVENOUS | Status: AC
  Filled 2014-04-18: qty 5

## 2014-04-18 MED ORDER — 0.9 % SODIUM CHLORIDE (POUR BTL) OPTIME
TOPICAL | Status: DC | PRN
  Administered 2014-04-18: 1000 mL

## 2014-04-18 MED ORDER — METOPROLOL TARTRATE 1 MG/ML IV SOLN
2.5000 mg | Freq: Four times a day (QID) | INTRAVENOUS | Status: DC
  Administered 2014-04-18 – 2014-04-19 (×3): 2.5 mg via INTRAVENOUS
  Filled 2014-04-18 (×5): qty 5

## 2014-04-18 MED ORDER — PANTOPRAZOLE SODIUM 40 MG IV SOLR
40.0000 mg | Freq: Two times a day (BID) | INTRAVENOUS | Status: DC
  Administered 2014-04-18 – 2014-04-24 (×13): 40 mg via INTRAVENOUS
  Filled 2014-04-18 (×18): qty 40

## 2014-04-18 MED ORDER — LACTATED RINGERS IV SOLN
INTRAVENOUS | Status: DC | PRN
  Administered 2014-04-18 (×4): via INTRAVENOUS

## 2014-04-18 MED ORDER — DEXMEDETOMIDINE HCL IN NACL 200 MCG/50ML IV SOLN
INTRAVENOUS | Status: DC | PRN
  Administered 2014-04-18: .7 ug/kg/h via INTRAVENOUS

## 2014-04-18 MED ORDER — MIDAZOLAM HCL 5 MG/5ML IJ SOLN
INTRAMUSCULAR | Status: DC | PRN
  Administered 2014-04-18 (×2): 1 mg via INTRAVENOUS

## 2014-04-18 MED ORDER — DIPHENHYDRAMINE HCL 50 MG/ML IJ SOLN: 12.5000 mg | Freq: Four times a day (QID) | INTRAMUSCULAR | Status: DC | PRN

## 2014-04-18 MED ORDER — LIDOCAINE HCL (CARDIAC) 20 MG/ML IV SOLN
INTRAVENOUS | Status: AC
Start: 2014-04-18 — End: 2014-04-18
  Filled 2014-04-18: qty 5

## 2014-04-18 MED ORDER — LACTATED RINGERS IV SOLN
INTRAVENOUS | Status: DC
  Administered 2014-04-18 (×2): 10 mL/h via INTRAVENOUS

## 2014-04-18 MED ORDER — PROPOFOL 10 MG/ML IV BOLUS
INTRAVENOUS | Status: DC | PRN
  Administered 2014-04-18: 50 mg via INTRAVENOUS
  Administered 2014-04-18: 30 mg via INTRAVENOUS
  Administered 2014-04-18: 90 mg via INTRAVENOUS

## 2014-04-18 MED ORDER — LACTATED RINGERS IV SOLN
INTRAVENOUS | Status: DC | PRN
  Administered 2014-04-18: 09:00:00 via INTRAVENOUS

## 2014-04-18 MED ORDER — SODIUM CHLORIDE 0.9 % IJ SOLN: 9.0000 mL | INTRAMUSCULAR | Status: DC | PRN

## 2014-04-18 MED ORDER — FENTANYL CITRATE 0.05 MG/ML IJ SOLN
50.0000 ug | INTRAMUSCULAR | Status: DC | PRN
  Administered 2014-04-18 – 2014-04-24 (×26): 50 ug via INTRAVENOUS
  Filled 2014-04-18 (×22): qty 2

## 2014-04-18 MED ORDER — PHENYLEPHRINE HCL 10 MG/ML IJ SOLN
10.0000 mg | INTRAVENOUS | Status: DC | PRN
  Administered 2014-04-18: 25 ug/min via INTRAVENOUS

## 2014-04-18 MED ORDER — CHLORHEXIDINE GLUCONATE 0.12 % MT SOLN
15.0000 mL | Freq: Two times a day (BID) | OROMUCOSAL | Status: DC
  Administered 2014-04-18 – 2014-04-24 (×13): 15 mL via OROMUCOSAL
  Filled 2014-04-18 (×13): qty 15

## 2014-04-18 MED ORDER — LIDOCAINE HCL (CARDIAC) 20 MG/ML IV SOLN
INTRAVENOUS | Status: DC | PRN
  Administered 2014-04-18: 40 mg via INTRAVENOUS

## 2014-04-18 MED ORDER — METOPROLOL TARTRATE 1 MG/ML IV SOLN
2.5000 mg | INTRAVENOUS | Status: DC | PRN
  Administered 2014-04-18 – 2014-04-20 (×6): 2.5 mg via INTRAVENOUS
  Filled 2014-04-18 (×3): qty 5

## 2014-04-18 SURGICAL SUPPLY — 199 items
ADH SKN CLS APL DERMABOND .7 (GAUZE/BANDAGES/DRESSINGS)
APPLIER CLIP LOGIC TI 5 (MISCELLANEOUS) ×3 IMPLANT
APPLIER CLIP ROT 10 11.4 M/L (STAPLE)
APR CLP MED LRG 11.4X10 (STAPLE)
APR CLP MED LRG 33X5 (MISCELLANEOUS) ×3
BAG SPEC RTRVL LRG 6X4 10 (ENDOMECHANICALS) ×3
BANDAGE HEMOSTAT MRDH 4X4 STRL (MISCELLANEOUS) IMPLANT
BLADE HEX COATED 2.75 (ELECTRODE) ×5 IMPLANT
BLADE SURG 11 STRL SS (BLADE) IMPLANT
BNDG HEMOSTAT MRDH 4X4 STRL (MISCELLANEOUS)
CANISTER SUCTION 2500CC (MISCELLANEOUS) ×7 IMPLANT
CATH FOLEY 2WAY SLVR 30CC 16FR (CATHETERS) ×2 IMPLANT
CATH KIT ON Q 5IN SLV (PAIN MANAGEMENT) IMPLANT
CATH ROBINSON RED A/P 14FR (CATHETERS) IMPLANT
CATH ROBINSON RED A/P 18FR (CATHETERS) ×8 IMPLANT
CATH ROBINSON RED A/P 22FR (CATHETERS) IMPLANT
CATH THORACIC 28FR (CATHETERS) IMPLANT
CATH THORACIC 36FR (CATHETERS) IMPLANT
CATH THORACIC 36FR RT ANG (CATHETERS) IMPLANT
CLAMP ENDO BABCK 10MM (STAPLE) IMPLANT
CLIP APPLIE ROT 10 11.4 M/L (STAPLE) IMPLANT
CLIP FOGARTY SPRING 6M (CLIP) ×5 IMPLANT
CLIP TI LARGE 6 (CLIP) IMPLANT
CLIP TI MEDIUM 24 (CLIP) IMPLANT
CLIP TI WIDE RED SMALL 24 (CLIP) IMPLANT
CONT SPEC 4OZ CLIKSEAL STRL BL (MISCELLANEOUS) ×10 IMPLANT
COVER MAYO STAND STRL (DRAPES) ×5 IMPLANT
COVER PROBE W GEL 5X96 (DRAPES) ×3 IMPLANT
COVER SURGICAL LIGHT HANDLE (MISCELLANEOUS) ×7 IMPLANT
DERMABOND ADVANCED (GAUZE/BANDAGES/DRESSINGS)
DERMABOND ADVANCED .7 DNX12 (GAUZE/BANDAGES/DRESSINGS) IMPLANT
DEVICE SUTURE ENDOST 10MM (ENDOMECHANICALS) ×3 IMPLANT
DISSECTOR BLUNT TIP ENDO 5MM (MISCELLANEOUS) ×3 IMPLANT
DRAIN PENROSE 1/2X36 STERILE (WOUND CARE) ×10 IMPLANT
DRAIN SUMP SARATOGA 24F (WOUND CARE) ×5 IMPLANT
DRAPE BILATERAL SPLIT (DRAPES) ×5 IMPLANT
DRAPE CAMERA VIDEO/LASER (DRAPES) ×2 IMPLANT
DRAPE CV SPLIT W-CLR ANES SCRN (DRAPES) ×5 IMPLANT
DRAPE INCISE IOBAN 66X45 STRL (DRAPES) ×4 IMPLANT
DRAPE LAPAROSCOPIC ABDOMINAL (DRAPES) ×4 IMPLANT
DRAPE PROXIMA HALF (DRAPES) ×4 IMPLANT
DRAPE SLUSH/WARMER DISC (DRAPES) IMPLANT
DRAPE STERI WOUND 35X35 8 5/8 (DRAPES) ×2 IMPLANT
DRAPE WARM FLUID 44X44 (DRAPE) ×7 IMPLANT
DRILL BIT 7/64X5 (BIT) IMPLANT
DRSG AQUACEL AG ADV 3.5X14 (GAUZE/BANDAGES/DRESSINGS) ×5 IMPLANT
ELECT BLADE 4.0 EZ CLEAN MEGAD (MISCELLANEOUS)
ELECT BLADE 6.5 EXT (BLADE) ×8 IMPLANT
ELECT NDL TIP 2.8 STRL (NEEDLE) ×2 IMPLANT
ELECT NEEDLE TIP 2.8 STRL (NEEDLE) ×10 IMPLANT
ELECT PAIRED SUBDERMAL (MISCELLANEOUS) ×5
ELECT REM PT RETURN 9FT ADLT (ELECTROSURGICAL) ×20
ELECTRODE BLDE 4.0 EZ CLN MEGD (MISCELLANEOUS) ×2 IMPLANT
ELECTRODE PAIRED SUBDERMAL (MISCELLANEOUS) ×1 IMPLANT
ELECTRODE REM PT RTRN 9FT ADLT (ELECTROSURGICAL) ×10 IMPLANT
ENDOLOOP SUT PDS II  0 18 (SUTURE)
ENDOLOOP SUT PDS II 0 18 (SUTURE) IMPLANT
EVACUATOR SILICONE 100CC (DRAIN) IMPLANT
GAUZE SPONGE 4X4 12PLY STRL (GAUZE/BANDAGES/DRESSINGS) ×10 IMPLANT
GLOVE BIO SURGEON STRL SZ 6 (GLOVE) ×9 IMPLANT
GLOVE BIO SURGEON STRL SZ 6.5 (GLOVE) ×14 IMPLANT
GLOVE BIO SURGEON STRL SZ7 (GLOVE) ×2 IMPLANT
GLOVE BIO SURGEON STRL SZ8 (GLOVE) ×6 IMPLANT
GLOVE BIO SURGEONS STRL SZ 6.5 (GLOVE) ×5
GLOVE BIOGEL PI IND STRL 6 (GLOVE) ×4 IMPLANT
GLOVE BIOGEL PI IND STRL 6.5 (GLOVE) ×4 IMPLANT
GLOVE BIOGEL PI IND STRL 7.0 (GLOVE) ×2 IMPLANT
GLOVE BIOGEL PI IND STRL 7.5 (GLOVE) ×2 IMPLANT
GLOVE BIOGEL PI INDICATOR 6 (GLOVE) ×8
GLOVE BIOGEL PI INDICATOR 6.5 (GLOVE) ×8
GLOVE BIOGEL PI INDICATOR 7.0 (GLOVE)
GLOVE BIOGEL PI INDICATOR 7.5 (GLOVE)
GOWN SPEC L4 XLG W/TWL (GOWN DISPOSABLE) ×2 IMPLANT
GOWN STRL REUS W/ TWL LRG LVL3 (GOWN DISPOSABLE) ×14 IMPLANT
GOWN STRL REUS W/TWL LRG LVL3 (GOWN DISPOSABLE) ×34 IMPLANT
GOWN STRL REUS W/TWL XL LVL3 (GOWN DISPOSABLE) ×8 IMPLANT
HANDLE UNIV ENDO GIA (ENDOMECHANICALS) ×6 IMPLANT
HEMOSTAT SURGICEL 2X14 (HEMOSTASIS) ×2 IMPLANT
INSERT FOGARTY 61MM (MISCELLANEOUS) IMPLANT
KIT BASIN OR (CUSTOM PROCEDURE TRAY) ×7 IMPLANT
KIT ROOM TURNOVER OR (KITS) ×5 IMPLANT
LIGASURE IMPACT 36 18CM CVD LR (INSTRUMENTS) ×5 IMPLANT
LOOP VESSEL MAXI BLUE (MISCELLANEOUS) ×8 IMPLANT
LOOP VESSEL MINI RED (MISCELLANEOUS) IMPLANT
MARKER SKIN DUAL TIP RULER LAB (MISCELLANEOUS) IMPLANT
NS IRRIG 1000ML POUR BTL (IV SOLUTION) ×18 IMPLANT
PACK CHEST (CUSTOM PROCEDURE TRAY) ×5 IMPLANT
PAD ARMBOARD 7.5X6 YLW CONV (MISCELLANEOUS) ×10 IMPLANT
PASSER SUT SWANSON 36MM LOOP (INSTRUMENTS) IMPLANT
PENCIL BUTTON HOLSTER BLD 10FT (ELECTRODE) ×10 IMPLANT
PIN SAFETY STERILE (MISCELLANEOUS) IMPLANT
PLUG CATH AND CAP STER (CATHETERS) ×8 IMPLANT
POUCH SPECIMEN RETRIEVAL 10MM (ENDOMECHANICALS) ×3 IMPLANT
PROBE NERVBE PRASS .33 (MISCELLANEOUS) ×5 IMPLANT
RELOAD EGIA 45 MED/THCK PURPLE (STAPLE) ×3 IMPLANT
RELOAD EGIA 60 MED/THCK PURPLE (STAPLE) ×5 IMPLANT
RELOAD EGIA TRIS TAN 45 CVD (STAPLE) IMPLANT
RELOAD ENDO GIA 30 3.5 (STAPLE) ×5 IMPLANT
RELOAD ENDO STITCH (ENDOMECHANICALS) ×10 IMPLANT
RELOAD LINEAR CUT PROX 55 BLUE (ENDOMECHANICALS) IMPLANT
RELOAD STAPLE 45 PURP MED/THCK (STAPLE) IMPLANT
RELOAD STAPLE 45 TAN MED CVD (STAPLE) IMPLANT
RELOAD STAPLE 55 3.8 BLU REG (ENDOMECHANICALS) IMPLANT
RELOAD STAPLE 60 MED/THCK ART (STAPLE) IMPLANT
RELOAD SUT TRIPLE-STITCH 2-0 (ENDOMECHANICALS) IMPLANT
RELOAD TRI 45 ART MED THCK PUR (STAPLE) ×5 IMPLANT
RELOAD TRI 60 ART MED THCK PUR (STAPLE) ×9 IMPLANT
RETAINER VISCERA MED (MISCELLANEOUS) ×5 IMPLANT
SCALPEL HARMONIC ACE (MISCELLANEOUS) ×3 IMPLANT
SCISSORS LAP 5X35 DISP (ENDOMECHANICALS) ×5 IMPLANT
SET IRRIG TUBING LAPAROSCOPIC (IRRIGATION / IRRIGATOR) ×8 IMPLANT
SHEARS HARMONIC ACE PLUS 36CM (ENDOMECHANICALS) IMPLANT
SLEEVE ENDOPATH XCEL 5M (ENDOMECHANICALS) ×12 IMPLANT
SOLUTION ANTI FOG 6CC (MISCELLANEOUS) ×5 IMPLANT
SPECIMEN JAR LARGE (MISCELLANEOUS) IMPLANT
SPECIMEN JAR LG PLASTIC EMPTY (MISCELLANEOUS) IMPLANT
SPECIMEN JAR MEDIUM (MISCELLANEOUS) IMPLANT
SPONGE GAUZE 4X4 12PLY STER LF (GAUZE/BANDAGES/DRESSINGS) ×3 IMPLANT
SPONGE LAP 18X18 X RAY DECT (DISPOSABLE) ×25 IMPLANT
SPONGE TONSIL 1.25 RF SGL STRG (GAUZE/BANDAGES/DRESSINGS) IMPLANT
STAPLE ECHEON FLEX 60 POW ENDO (STAPLE) IMPLANT
STAPLER 90 3.5 STAND SLIM (STAPLE)
STAPLER 90 3.5 STD SLIM (STAPLE) IMPLANT
STAPLER AUT SUT 4.8 EEAXL 21 (STAPLE) ×3 IMPLANT
STAPLER PROXIMATE 55 BLUE (STAPLE) IMPLANT
STAPLER VISISTAT 35W (STAPLE) ×10 IMPLANT
SUCTION POOLE TIP (SUCTIONS) IMPLANT
SUT ETHILON 2 0 FS 18 (SUTURE) ×5 IMPLANT
SUT ETHILON 3 0 FSL (SUTURE) ×2 IMPLANT
SUT MNCRL AB 4-0 PS2 18 (SUTURE) IMPLANT
SUT PDS AB 0 CT 36 (SUTURE) ×6 IMPLANT
SUT PROLENE 0 CT 1 CR/8 (SUTURE) IMPLANT
SUT PROLENE 1 XLH (SUTURE) ×2 IMPLANT
SUT PROLENE 2 0 MH 48 (SUTURE) IMPLANT
SUT PROLENE 2 TP 1 (SUTURE) ×10 IMPLANT
SUT PROLENE 3 0 RB 1 (SUTURE) IMPLANT
SUT PROLENE 3 0 SH DA (SUTURE) ×9 IMPLANT
SUT PROLENE 4 0 RB 1 (SUTURE)
SUT PROLENE 4-0 RB1 .5 CRCL 36 (SUTURE) IMPLANT
SUT SILK  1 MH (SUTURE) ×4
SUT SILK 1 MH (SUTURE) ×6 IMPLANT
SUT SILK 1 TIES 10X30 (SUTURE) ×8 IMPLANT
SUT SILK 2 0 (SUTURE) ×5
SUT SILK 2 0 SH CR/8 (SUTURE) ×10 IMPLANT
SUT SILK 2 0SH CR/8 30 (SUTURE) ×5 IMPLANT
SUT SILK 2-0 18XBRD TIE 12 (SUTURE) ×3 IMPLANT
SUT SILK 3 0 (SUTURE) ×5
SUT SILK 3 0 SH CR/8 (SUTURE) IMPLANT
SUT SILK 3 0SH CR/8 30 (SUTURE) IMPLANT
SUT SILK 3-0 18XBRD TIE 12 (SUTURE) ×1 IMPLANT
SUT SILK 4 0 SH CR/8 (SUTURE) ×2 IMPLANT
SUT VIC AB 1 CTX 18 (SUTURE) IMPLANT
SUT VIC AB 1 CTX 27 (SUTURE) IMPLANT
SUT VIC AB 1 CTX 36 (SUTURE)
SUT VIC AB 1 CTX36XBRD ANBCTR (SUTURE) IMPLANT
SUT VIC AB 2-0 CT1 18 (SUTURE) ×2 IMPLANT
SUT VIC AB 2-0 CTX 36 (SUTURE) ×2 IMPLANT
SUT VIC AB 3-0 MH 27 (SUTURE) IMPLANT
SUT VIC AB 3-0 SH 18 (SUTURE) IMPLANT
SUT VIC AB 3-0 X1 27 (SUTURE) ×4 IMPLANT
SUT VIC AB 4-0 SH 18 (SUTURE) ×4 IMPLANT
SUT VICRYL 2 TP 1 (SUTURE) IMPLANT
SYR 20CC LL (SYRINGE) IMPLANT
SYR 5ML LUER SLIP (SYRINGE) ×5 IMPLANT
SYR TOOMEY 50ML (SYRINGE) ×5 IMPLANT
SYRINGE 10CC LL (SYRINGE) IMPLANT
SYS LAPSCP GELPORT 120MM (MISCELLANEOUS) ×5
SYSTEM LAPSCP GELPORT 120MM (MISCELLANEOUS) ×1 IMPLANT
SYSTEM SAHARA CHEST DRAIN ATS (WOUND CARE) ×2 IMPLANT
SYSTEM SAHARA CHEST DRAIN RE-I (WOUND CARE) ×2 IMPLANT
TAPE CLOTH SURG 4X10 WHT LF (GAUZE/BANDAGES/DRESSINGS) ×3 IMPLANT
TAPE UMBILICAL 1/8 X36 TWILL (MISCELLANEOUS) ×5 IMPLANT
TAPE UMBILICAL COTTON 1/8X30 (MISCELLANEOUS) ×8 IMPLANT
TIP INNERVISION DETACH 40FR (MISCELLANEOUS) IMPLANT
TIP INNERVISION DETACH 50FR (MISCELLANEOUS) IMPLANT
TIP INNERVISION DETACH 56FR (MISCELLANEOUS) IMPLANT
TIPS INNERVISION DETACH 40FR (MISCELLANEOUS)
TOWEL OR 17X24 6PK STRL BLUE (TOWEL DISPOSABLE) ×10 IMPLANT
TOWEL OR 17X26 10 PK STRL BLUE (TOWEL DISPOSABLE) ×18 IMPLANT
TRAP SPECIMEN MUCOUS 40CC (MISCELLANEOUS) ×3 IMPLANT
TRAY FOLEY CATH 14FRSI W/METER (CATHETERS) ×10 IMPLANT
TRAY LAPAROSCOPIC (CUSTOM PROCEDURE TRAY) ×2 IMPLANT
TROCAR BLADELESS OPT 5 75 (ENDOMECHANICALS) ×5 IMPLANT
TROCAR XCEL 12X100 BLDLESS (ENDOMECHANICALS) ×3 IMPLANT
TROCAR XCEL BLUNT TIP 100MML (ENDOMECHANICALS) ×3 IMPLANT
TROCAR XCEL NON-BLD 11X100MML (ENDOMECHANICALS) ×3 IMPLANT
TROCAR XCEL NON-BLD 5MMX100MML (ENDOMECHANICALS) ×3 IMPLANT
TROCAR XCEL UNIV SLVE 11M 100M (ENDOMECHANICALS) IMPLANT
TUBE CONNECTING 12'X1/4 (SUCTIONS)
TUBE CONNECTING 12X1/4 (SUCTIONS) IMPLANT
TUBE ENDOTRAC EMG 7X10.2 (MISCELLANEOUS) IMPLANT
TUBE ENDOTRAC EMG 8X11.3 (MISCELLANEOUS) IMPLANT
TUBE ENDOTRACH  EMG 6MMTUBE EN (MISCELLANEOUS)
TUBE ENDOTRACH EMG 6MMTUBE EN (MISCELLANEOUS) IMPLANT
TUBING BULK SUCTION (MISCELLANEOUS) ×3 IMPLANT
TUBING INSUFFLATION 10FT LAP (TUBING) ×5 IMPLANT
TUNNELER SHEATH ON-Q 11GX8 DSP (PAIN MANAGEMENT) IMPLANT
WATER STERILE IRR 1000ML POUR (IV SOLUTION) ×10 IMPLANT
YANKAUER SUCT BULB TIP NO VENT (SUCTIONS) ×2 IMPLANT

## 2014-04-18 NOTE — Anesthesia Postprocedure Evaluation (Signed)
  Anesthesia Post-op Note  Patient: Charles [name]  Procedure(s) Performed: Procedure(s): LAPAROSCOPIC PROXIMAL ESOPHAGOGASTRECTOMY/DISTAL with EEA  (N/A) LAPAROSCOPIC LYSIS OF ADHESIONS (N/A) JEJUNOSTOMY (N/A)  Patient Location: ICU  Anesthesia Type:General  Level of Consciousness: sedated  Airway and Oxygen Therapy: Patient remains intubated per anesthesia plan  Post-op Pain: none  Post-op Assessment: Post-op Vital signs reviewed, Patient's Cardiovascular Status Stable, Respiratory Function Stable and Patent Airway  Post-op Vital Signs: Reviewed and stable  Last Vitals:  Filed Vitals:   05/02/2014 1913  BP: 161/75  Pulse: 89  Temp:   Resp: 19    Complications: No apparent anesthesia complications

## 2014-04-18 NOTE — Anesthesia Preprocedure Evaluation (Signed)
Anesthesia Evaluation  Patient identified by MRN, date of birth, ID band Patient awake    Reviewed: Allergy & Precautions, H&P , NPO status , Patient's Chart, lab work & pertinent test results  History of Anesthesia Complications (+) PONV and history of anesthetic complications  Airway Mallampati: I TM Distance: >3 FB Neck ROM: Full    Dental  (+) Teeth Intact   Pulmonary neg sleep apnea, neg COPDneg recent URI, former smoker,  breath sounds clear to auscultation        Cardiovascular hypertension, Pt. on medications and Pt. on home beta blockers - angina+ CABG - Past MI, - CHF and - DOE + dysrhythmias - Valvular Problems/MurmursRhythm:Regular     Neuro/Psych TIACVA, No Residual Symptoms negative psych ROS   GI/Hepatic Neg liver ROS, Esophageal cancer   Endo/Other  diabetes, Type 2  Renal/GU Renal InsufficiencyRenal disease     Musculoskeletal negative musculoskeletal ROS (+)   Abdominal   Peds  Hematology negative hematology ROS (+)   Anesthesia Other Findings   Reproductive/Obstetrics                           Anesthesia Physical Anesthesia Plan  ASA: III  Anesthesia Plan: General   Post-op Pain Management:    Induction: Intravenous  Airway Management Planned: Oral ETT  Additional Equipment: Arterial line and CVP  Intra-op Plan:   Post-operative Plan: Extubation in OR and Possible Post-op intubation/ventilation  Informed Consent: I have reviewed the patients History and Physical, chart, labs and discussed the procedure including the risks, benefits and alternatives for the proposed anesthesia with the patient or authorized representative who has indicated his/her understanding and acceptance.   Dental advisory given  Plan Discussed with: CRNA and Surgeon  Anesthesia Plan Comments:         Anesthesia Quick Evaluation

## 2014-04-18 NOTE — Transfer of Care (Signed)
Immediate Anesthesia Transfer of Care Note  Patient: Charles [name]  Procedure(s) Performed: Procedure(s): LAPAROSCOPIC PROXIMAL ESOPHAGOGASTRECTOMY/DISTAL with EEA  (N/A) LAPAROSCOPIC LYSIS OF ADHESIONS (N/A) JEJUNOSTOMY (N/A)  Patient Location: SICU  Anesthesia Type:General  Level of Consciousness: Patient remains intubated per anesthesia plan  Airway & Oxygen Therapy: Patient remains intubated per anesthesia plan  Post-op Assessment: Report given to PACU RN  Post vital signs: Reviewed and stable  Complications: No apparent anesthesia complications

## 2014-04-18 NOTE — Procedures (Signed)
Extubation Procedure Note  Patient Details:   Name: Charles [name] DOB: 09/30/1934 MRN: [id number]   Airway Document Pre extubation: Pt completed Rapid Wean without complication. Follows all commands. ABG within normal rangeNIF -26, VC 986mls. Cuff leak present. Pt extubated. Post extubation: Placed on 2L /Sats 98%. Pt able to speak name/location. Clear BBS.  No stridor. RT will monitor.  Evaluation  O2 sats: stable throughout Complications: No apparent complications Patient did tolerate procedure well. Bilateral Breath Sounds: Clear   Yes  Sharen Hint 04/30/2014, 7:51 PM

## 2014-04-18 NOTE — OR Nursing (Signed)
Incorrect count. Xray taken. Per Dr. Gerilyn Pilgrim, xray clear. Dr. Servando Snare notified. Pt taken to SICU.

## 2014-04-18 NOTE — Progress Notes (Signed)
   Department of Radiation Oncology  Phone:  [phone] Fax:        [phone]  Diagnosis: GE junction cancer  This patient received 50.4 gray. The patient was treated with intensity modulated radiation therapy using a rapid arc technique. Below is a picture representation of the area covered with his preoperative treatment.

## 2014-04-18 NOTE — Consult Note (Signed)
PHARMACY CONSULT NOTE--POST-PROCEDURE ANTIBIOTICS   Consult  :  Cefoxitin Indication :  Empiric Post-Procedure Prophylaxis  Pharmacy consulted for dosing Cefoxitin x 24 hours s/p enterolysis, esophagogastrectomy, feeding jejunostomy.   Patient  is to receive Cefoxitin 1 gm IV q 6 hours x 4 doses for empiric post- procedure prophylaxis.   Wt 81 kg,  CrCl 56 ml/min.  No dosing adjustments required.  Continue Cefoxitin as previously ordered.  Pharmacy will sign off. Please reconsult if additional assistance is needed.   Thank you.  Sheilah Rayos, Craig Guess,  Pharm.D.,  05/04/2014,  4:57 PM

## 2014-04-18 NOTE — H&P (View-Only) (Signed)
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 Diagnosed March and treated with radiation chemotherapy.  History of Present Illness:  Charles [name] is an 79 y.o. male referred by Dr. 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 This was diagnosed in March on endoscopy by Dr. Oletta Lamas and was treated with chemotherapy and radiation therapy. He is ready to proceed with a resection of the surgery. I spoke with Dr. Servando Snare about him and we plan a laparoscopic approach and to possibly complete the resection through  the chest if necessary.  Dr. Paulita Fujita is performed he [place] after chemoradiation in their may be one node that may be positive. PET scan was done pretreatment and there was a positive node in the gastrohepatic region.  I discussed the surgical management with him. CT scan chemoradiation we may need to extend our proximal margin to anastomose in healthy esophageal tissue. He is aware that we would likely place a feeding jejunostomy.  We'll go ahead and get our office to schedule this jointly with Dr. Servando Snare.    Past Medical History  Diagnosis Date  . Hypertension   . Hyperlipidemia     diet controlled, statins and zetia intolerant  . Retinopathy     mild glaucomea  . Transient ischemic attack 1999  . Histoplasmosis   . Chronic cough 08/2009    -allergy profile- june 2,2011- neg. -Max GERD rx/ off oils January 07 2010 x 3 weeks only but no benefit. - Sinus CT rec July 11,2011- refused due to claustrophobia -MCT neg for asthma 03/01/10 -Add 1st Gen H1 July 11,2011- better  . Macular degeneration   . Coronary artery disease     CABG 01-2013, stent 04-2013    . Heart murmur     TIA  . Adenocarcinoma of gastroesophageal junction   . Stroke 1995    tia 2 years ago  . Radiation 11/19/13-12/26/13    gastroesophageal junction area 50.4 gray  . Diabetes mellitus     no  meds- controls with diet  . PONV (postoperative nausea and vomiting)     extreme nausea    Past Surgical History  Procedure Laterality Date  . Cervical spine surgery  2002    plate  . Cholecystectomy  1985  . Percutaneous coronary stent intervention (pci-s)  101/31/2014    [facility]  . Coronary artery bypass graft N/A 01/21/2013    Procedure: Coronary Arery Bypass Grafting  Times Five Using Left Internal Mammary Artery and Right Saphenous Leg Vein Harvested Endoscopically;  Surgeon: Melrose Nakayama, MD;  Location: [facility];  Service: Open Heart Surgery;  Laterality: N/A;  . Tee without cardioversion N/A 08/23/2013    Procedure: TRANSESOPHAGEAL ECHOCARDIOGRAM (TEE);  Surgeon: Sanda Klein, MD;  Location: [facility] ENDOSCOPY;  Service: Cardiovascular;  Laterality: N/A;  . Esophagogastroduodenoscopy N/A 10/17/2013    Procedure: ESOPHAGOGASTRODUODENOSCOPY (EGD);  Surgeon: Winfield Cunas., MD;  Location: [facility] ENDOSCOPY;  Service: Endoscopy;  Laterality: N/A;  . Appendectomy    . Esophagogastroduodenoscopy N/A 02/05/2014    Procedure: ESOPHAGOGASTRODUODENOSCOPY (EGD);  Surgeon: Winfield Cunas., MD;  Location: [facility] ENDOSCOPY;  Service: Endoscopy;  Laterality: N/A;  . Eus N/A 03/05/2014    Procedure: ESOPHAGEAL ENDOSCOPIC ULTRASOUND (EUS) RADIAL;  Surgeon: Arta Silence, MD;  Location: [facility] ENDOSCOPY;  Service: Endoscopy;  Laterality: N/A;    Current Outpatient Prescriptions  Medication Sig Dispense Refill  . aspirin EC 81 MG tablet Take 81  mg by mouth daily.      . Cholecalciferol (VITAMIN D3) 2000 UNITS TABS Take 2,000 Units by mouth daily.      . Coenzyme Q10 (COQ10) 100 MG CAPS Take 100 mg by mouth daily.      . finasteride (PROSCAR) 5 MG tablet Take 5 mg by mouth every morning.       Marland Kitchen glucose blood test strip Use as instructed to test blood glucose daily Dx: 250.00  100 each  12  . isosorbide mononitrate (IMDUR) 60 MG 24 hr tablet Take 60 mg by mouth every morning.      Marland Kitchen losartan (COZAAR)  50 MG tablet TAKE 1 TABLET BY MOUTH EVERY DAY  90 tablet  3  . metoprolol tartrate (LOPRESSOR) 25 MG tablet Take 25 mg by mouth 2 (two) times daily.      . nitroGLYCERIN (NITROSTAT) 0.4 MG SL tablet Place 0.4 mg under the tongue every 5 (five) minutes as needed for chest pain.      Marland Kitchen omeprazole (PRILOSEC) 20 MG capsule Take 20 mg by mouth 2 (two) times daily before a meal.      . pravastatin (PRAVACHOL) 40 MG tablet Take 40 mg by mouth daily.       No current facility-administered medications for this visit.   Percocet; Atorvastatin; Ezetimibe; Lovastatin; Ranexa; and Tramadol Family History  Problem Relation Age of Onset  . Coronary artery disease Neg Hx   . Stroke Neg Hx   . Colon cancer Neg Hx   . Prostate cancer Neg Hx   . Atopy Neg Hx   . Asthma Neg Hx   . Hypertension Mother   . Hypertension Father   . Diabetes Father   . Cancer Father     colon  . Hypertension Brother   . Cancer Brother     lung  . Hypertension Sister   . Cancer Sister     lung  . Diabetes Brother   . Diabetes Sister   . Diabetes      Grandparents   . Lung cancer Brother   . Lung cancer Sister    Social History:   reports that he quit smoking about 37 years ago. His smoking use included Cigarettes. He has a 10 pack-year smoking history. He has never used smokeless tobacco. He reports that he does not drink alcohol or use illicit drugs.   REVIEW OF SYSTEMS : Negative except for hearing loss  Physical Exam:   Blood pressure 136/82, pulse 71, temperature 97.3 F (36.3 C), temperature source Temporal, resp. rate 16, height 5\' 11"  (1.803 m), weight 173 lb 9.6 oz (78.744 kg). Body mass index is 24.22 kg/(m^2).  Gen:  WDWN WM NAD  Neurological: Alert and oriented to person, place, and time. Motor and sensory function is grossly intact  Head: Normocephalic and atraumatic.  Eyes: Conjunctivae are normal. Pupils are equal, round, and reactive to light. No scleral icterus.  Neck: Normal range of motion.  Neck supple. No tracheal deviation or thyromegaly present.  Cardiovascular:  SR without murmurs or gallops.  No carotid bruits-median sternotomy incision Breast:  Not examined Respiratory: Effort normal.  No respiratory distress. No chest wall tenderness. Breath sounds normal.  No wheezes, rales or rhonchi.  Abdomen:  There is a right paramedian incision from on open chole in the late 70s.   GU:  Not examined.  PET showed a highlight inside the prostate of uncertain significance Musculoskeletal: Normal range of motion. Extremities are nontender. No cyanosis, edema  or clubbing noted Lymphadenopathy: No cervical, preauricular, postauricular or axillary adenopathy is present Skin: Skin is warm and dry. No rash noted. No diaphoresis. No erythema. No pallor. Pscyh: Normal mood and affect. Behavior is normal. Judgment and thought content normal.   LABORATORY RESULTS: No results found for this or any previous visit (from the past 48 hour(s)).   RADIOLOGY RESULTS: No results found.  Problem List: Patient Active Problem List   Diagnosis Date Noted  . Pre-operative clearance 03/17/2014  . Contact dermatitis 03/05/2014  . Gastroesophageal cancer 10/28/2013  . Second degree AV block 10/25/2013  . CVA (cerebral infarction) 08/06/2013  . TIA (transient ischemic attack) 08/06/2013  . H/O: CVA (cerebrovascular accident) 08/06/2013  . Unspecified constipation 06/14/2013  . S/P SVG-RI/OM DES 05/06/13- staged DES to RCA 06/07/13 06/08/2013  . Chronic renal insufficiency, stage III (moderate) 06/08/2013  . RBBB 06/08/2013  . Angina, class III 06/07/2013  . Anxiety and depression 05/14/2013  . CAD- S/P CABG x 5 (LIMA-LAD, SVG-RI-OM1, SVG to distal RCA, SVG-PDA) 01/21/13 01/23/2013  . Left main coronary artery disease 01/16/2013  . Abnormal nuclear cardiac imaging test - Inferior Ischemia 01/07/2013  . Yellow jacket sting 04/20/2012  . Annual physical exam 02/17/2012  . Fatigue 01/13/2012  .  Onychomycosis 11/29/2010  . GLAUCOMA 12/16/2009  . DIABETES MELLITUS, TYPE II 06/07/2007  . INSOMNIA-SLEEP DISORDER-UNSPEC 05/07/2007  . HYPERLIPIDEMIA 03/01/2007  . HYPERTENSION 03/01/2007    Assessment & Plan: Esophagogastric cancer;  Will schedule with Dr. Servando Snare at [facility] when our schedules permit.     Matt B. Hassell Done, MD, [facility] [phone] beeper [phone]  03/21/2014 4:19 PM

## 2014-04-18 NOTE — Progress Notes (Signed)
Patient ID: Charles [name], male   DOB: 1934-12-05, 79 y.o.   MRN: [id number] EVENING ROUNDS NOTE :     [address]       [place] [address]             [phone]                 Day of Surgery Procedure(s) (LRB): LAPAROSCOPIC PROXIMAL ESOPHAGOGASTRECTOMY/DISTAL with EEA  (N/A) LAPAROSCOPIC LYSIS OF ADHESIONS (N/A) JEJUNOSTOMY (N/A)  Total Length of Stay:  LOS: 0 days  BP 162/70  Pulse 90  Temp(Src) 98 F (36.7 C) (Oral)  Resp 12  Ht 5' 10.87" (1.8 m)  Wt 179 lb (81.194 kg)  BMI 25.06 kg/m2  SpO2 99%  .Intake/Output     09/10 0701 - 09/11 0700 09/11 0701 - 09/12 0700   I.V. (mL/kg)  4806.7 (59.2)   IV Piggyback  50   Total Intake(mL/kg)  4856.7 (59.8)   Urine (mL/kg/hr)  545 (0.6)   Total Output   545   Net   +4311.7          . dextrose 5 % and 0.45 % NaCl with KCl 20 mEq/L 100 mL/hr at 04/15/2014 1800  . lactated ringers 10 mL/hr at 04/24/2014 1800  . nitroGLYCERIN 80 mcg/min ( 1815)     Lab Results  Component Value Date   WBC 8.1 04/14/2014   HGB 11.4* 04/08/2014   HCT 34.4* 04/15/2014   PLT 121* 04/14/2014   GLUCOSE 151* 04/15/2014   CHOL 167 08/07/2013   TRIG 165* 08/07/2013   HDL 52 08/07/2013   LDLDIRECT 151.6 07/25/2011   LDLCALC 82 08/07/2013   ALT 17 04/10/2014   AST 24 04/10/2014   NA 139 05/03/2014   K 4.6 04/08/2014   CL 105 04/20/2014   CREATININE 1.20 04/13/2014   BUN 25* 04/22/2014   CO2 23 04/28/2014   TSH 2.581 05/01/2013   INR 1.08 04/10/2014   HGBA1C 6.2* 08/07/2013   MICROALBUR 2.4* 07/25/2011   Stable early post op, on vent waking up start weaning vent  Grace Isaac MD  Beeper [phone] Office [phone]  6:41 PM

## 2014-04-18 NOTE — Interval H&P Note (Signed)
History and Physical Interval Note:  04/08/2014 7:30 AM  Charles [name]  has presented today for surgery, with the diagnosis of ESOPHAGEAL CANCER  The various methods of treatment have been discussed with the patient and family. After consideration of risks, benefits and other options for treatment, the patient has consented to  Procedure(s): LAPAROSCOPIC PROXIMAL GASTRECTOMY/DISTAL (N/A) ESOPHAGOGASTRECTOMY/POSSIBLE TRANHITIAL ESOPHAGECTOMY (N/A) POSSIBLE RIGHT THORACOTOMY (Right) as a surgical intervention .  The patient's history has been reviewed, patient examined, no change in status, stable for surgery.  I have reviewed the patient's chart and labs.  Questions were answered to the patient's satisfaction.     Amit Leece B

## 2014-04-18 NOTE — H&P (Signed)
[address]       [place] [address]             [phone]                    Rulon O Saffo [place] Medical Record #[id number] Date of Birth: 1935-07-03  Referring:Dr Benay Spice Primary Care: Kathlene November, MD  Chief Complaint:    GE junction cancer   History of Present Illness:    Charles [name] 79 y.o. male is seen in the office for gastric cancer. In April of 2015 the patient noted a single tarry stool. He had no other symptoms of difficulty swallowing or painful swallowing. Dr Oletta Lamas did a UGI endoscopy and bx obtained of ulcered mass of GE junction and cardia.  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 RESULT RATIO OF HER2: CEP 17 SIGNALS 1.15 AVERAGE HER2 COPY NUMBER PER CELL 2.35 REFERENCE RANGE NEGATIVE HER2/Chr17 Ratio <2.0 and Average HER2 copy number <4.0 EQUIVOCAL HER2/Chr17 Ratio <2.0 and Average HER2 copy number 4.0 and <6.0 POSITIVE HER2/Chr17 Ratio >=2.0 and/or Average HER2 copy number >=6.0 [name] MD Pathologist, Electronic Signature ( Signed 11/01/2013) FINAL Gastroesophageal cancer   Primary site: Esophagus - Adenocarcinoma   Staging method: AJCC 7th Edition   Clinical: (TX, N1, M0)   Summary: (TX, N1, M0)  Patent has completed chemo and radiation therapy (last radiation May17) . He feels well. He is taking po diet without difficulty. Repeat endo with EUS done and cardiology has seen for clearance. Currently he has no anginal symptoms.  Current Activity/ Functional Status:  Patient is independent with mobility/ambulation, transfers, ADL's, IADL's.   Zubrod Score: At the time of surgery this patient's most appropriate activity status/level should be described as: _0     0    Normal activity, no symptoms _1     1    Restricted in physical  strenuous activity but ambulatory, able to do out light work _2     2    Ambulatory and capable of self care, unable to do work activities, up and about               >50 % of waking hours                              _3     3    Only limited self care, in bed greater than 50% of waking hours _4     4    Completely disabled, no self care, confined to bed or chair _5     5    Moribund   Past Medical History  Diagnosis Date  . Hypertension   . Hyperlipidemia     diet controlled, statins and zetia intolerant  . Retinopathy     mild glaucomea  . Transient ischemic attack 1999  . Histoplasmosis   . Chronic cough 08/2009    -allergy profile- june 2,2011- neg. -Max GERD rx/ off oils January 07 2010 x 3 weeks only but no benefit. - Sinus CT rec July 11,2011- refused due to claustrophobia -MCT neg for asthma 03/01/10 -Add 1st Gen H1 July 11,2011- better  . Macular degeneration   . Coronary artery disease     CABG 01-2013, stent 04-2013    . Heart murmur  TIA  . Adenocarcinoma of gastroesophageal junction   . Stroke 1995    tia 2 years ago  . Radiation 11/19/13-12/26/13    gastroesophageal junction area 50.4 gray  . Diabetes mellitus     no meds- controls with diet  . PONV (postoperative nausea and vomiting)     extreme nausea  . Dysrhythmia     bradyarrhythmias, 2nd degree AVB with pauses 10/2013 (Dr. Sallyanne Kuster)    Past Surgical History  Procedure Laterality Date  . Cervical spine surgery  2002    plate  . Cholecystectomy  1985  . Percutaneous coronary stent intervention (pci-s)  101/31/2014    Dr[facility]hARDING  . Coronary artery bypass graft N/A 01/21/2013    Procedure: Coronary Arery Bypass Grafting  Times Five Using Left Internal Mammary Artery and Right Saphenous Leg Vein Harvested Endoscopically;  Surgeon: Melrose Nakayama, MD;  Location: [facility];  Service: Open Heart Surgery;  Laterality: N/A;  . Tee without cardioversion N/A 08/23/2013    Procedure: TRANSESOPHAGEAL ECHOCARDIOGRAM (TEE);   Surgeon: Sanda Klein, MD;  Location: [facility] ENDOSCOPY;  Service: Cardiovascular;  Laterality: N/A;  . Esophagogastroduodenoscopy N/A 10/17/2013    Procedure: ESOPHAGOGASTRODUODENOSCOPY (EGD);  Surgeon: Winfield Cunas., MD;  Location: [facility] ENDOSCOPY;  Service: Endoscopy;  Laterality: N/A;  . Appendectomy    . Esophagogastroduodenoscopy N/A 02/05/2014    Procedure: ESOPHAGOGASTRODUODENOSCOPY (EGD);  Surgeon: Winfield Cunas., MD;  Location: [facility] ENDOSCOPY;  Service: Endoscopy;  Laterality: N/A;  . Eus N/A 03/05/2014    Procedure: ESOPHAGEAL ENDOSCOPIC ULTRASOUND (EUS) RADIAL;  Surgeon: Arta Silence, MD;  Location: [facility] ENDOSCOPY;  Service: Endoscopy;  Laterality: N/A;  . Eye surgery      Family History  Problem Relation Age of Onset  . Coronary artery disease Neg Hx   . Stroke Neg Hx   . Colon cancer Neg Hx   . Prostate cancer Neg Hx   . Atopy Neg Hx   . Asthma Neg Hx   . Hypertension Mother   . Hypertension Father   . Diabetes Father   . Cancer Father     colon  . Hypertension Brother   . Cancer Brother     lung  . Hypertension Sister   . Cancer Sister     lung  . Diabetes Brother   . Diabetes Sister   . Diabetes      Grandparents   . Lung cancer Brother   . Lung cancer Sister     History   Social History  . Marital Status: Married    Spouse Name: Charles [name]    Number of Children: 2  . Years of Education: college   Occupational History  . reitred    Social History Main Topics  . Smoking status: Former Smoker -- 1.00 packs/day for 10 years    Types: Cigarettes    Quit date: 01/17/1977  . Smokeless tobacco: Never Used     Comment: Stopped at age 79   . Alcohol Use: No     Comment: seldom  . Drug Use: No  . Sexual Activity: No   Other Topics Concern  . 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 Former Smoker -- 1.00 packs/day for 10 years  . Types: Cigarettes  .  Quit date: 01/17/1977  Smokeless tobacco  . Never Used    Comment: Stopped at age 10     History  Alcohol Use No    Comment: seldom     Allergies  Allergen Reactions  . Percocet [Oxycodone-Acetaminophen] Other (See Comments)    hallucinations  . Atorvastatin Other (See Comments)    Causes myalgias   . Ezetimibe Other (See Comments)    Causes dizziness  . Lovastatin Other (See Comments)    REACTION: weakness. Patient able to tolerate pravastatin  . Ranexa [Ranolazine] Other (See Comments)    Severe constipation  . Tramadol     Nausea and Vomiting    Current Facility-Administered Medications  Medication Dose Route Frequency Provider Last Rate Last Dose  . cefUROXime (ZINACEF) 1.5 g in dextrose 5 % 50 mL IVPB  1.5 g Intravenous 60 min Pre-Op Grace Isaac, MD         Review of Systems:     Cardiac Review of Systems: Y or N  Chest Pain [ n   ]  Resting SOB [ n] Exertional SOB  [ n  Orthopnea [n  ]   Pedal Edema [ n  ]    Palpitationsn ] Syncope  [n ]   Presyncope n  ]  General Review of Systems: [Y] = yes [  ]=no Constitional: recent weight change [n  ];  Wt loss over the last 3 months [   ] anorexia [  ]; fatigue [  ]; nausea [  ]; night sweats [  ]; fever [  ]; or chills [  ];          Dental: poor dentition[  ]; Last Dentist visit:   Eye : blurred vision [  ]; diplopia [   ]; vision changes [  ];  Amaurosis fugax[  ]; Resp: cough [  ];  wheezing[  ];  hemoptysis[  ]; shortness of breath[  ]; paroxysmal nocturnal dyspnea[  ]; dyspnea on exertion[  ]; or orthopnea[  ];  GI:  gallstones[  ], vomiting[  ];  dysphagia[  ]; melena[  ];  hematochezia [  ]; heartburn[  ];   Hx of  Colonoscopy[  ]; GU: kidney stones [  ]; hematuria[  ];   dysuria [  ];  nocturia[  ];  history of     obstruction [  ]; urinary frequency Blue.Reese  ]             Skin: rash, swelling[  ];, hair loss[  ];   peripheral edema[  ];  or itching[  ]; Musculosketetal: myalgias[  ];  joint swelling[  ];  joint erythema[  ];  joint pain[  ];  back pain[  ];  Heme/Lymph: bruising[  ];  bleeding[  ];  anemia[  ];  Neuro: TIA[ yes 07/30/2013 ];  headaches[  ];  stroke[  ];  vertigo[  ];  seizures[  ];   paresthesias[  ];  difficulty walking[n  ];  Psych:depression[  ]; anxiety[  ];  Endocrine: diabetes[ y ];  thyroid dysfunction[ n ];  Immunizations: Flu up to date [?  ]; Pneumococcal up to date [?  ];  Other:  Physical Exam: BP 171/77  Pulse 62  Temp(Src) 98.1 F (36.7 C)  Resp 18  Wt 179 lb (81.194 kg)  SpO2 100%  PHYSICAL EXAMINATION:  General appearance: alert, cooperative and appears stated age Neurologic: intact Heart: regular rate and rhythm, S1, S2 normal,  no murmur, click, rub or gallop Lungs: clear to auscultation bilaterally and normal percussion bilaterally Abdomen: soft, non-tender; bowel sounds normal; no masses,  no organomegaly Extremities: extremities normal, atraumatic, no cyanosis or edema no cervical , supraclavicular or axillary nodes No pedal edema  Diagnostic Studies & Laboratory data:     Recent Radiology Findings:   Nm Pet Image Restag (ps) Skull Base To Thigh  02/13/2014   CLINICAL DATA:  Subsequent treatment strategy for gastroesophageal cancer. Radiation therapy completed 12/26/2013. Undergoing chemotherapy.  EXAM: NUCLEAR MEDICINE PET SKULL BASE TO THIGH  TECHNIQUE: 7.3 mCi F-18 FDG was injected intravenously. Full-ring PET imaging was performed from the skull base to thigh after the radiotracer. CT data was obtained and used for attenuation correction and anatomic localization.  FASTING BLOOD GLUCOSE:  Value: 105 mg/dl  COMPARISON:  PET-CT 11/08/2013. CTs of the chest CT, abdomen and pelvis 10/18/2013.  FINDINGS: NECK  No hypermetabolic cervical lymph nodes are identified.There are no lesions of the pharyngeal mucosal space.  CHEST  There are no hypermetabolic  mediastinal, hilar or axillary lymph nodes. There is no hypermetabolic pulmonary activity. Calcified right hilar lymph nodes and a calcified right upper lobe granuloma are again noted. Emphysema and extensive atherosclerosis are noted status post CABG.  ABDOMEN/PELVIS  The degree of hypermetabolic activity at the gastroesophageal junction and extending into the gastric cardia has decreased compared with the prior study. This currently has an SUV max of 14.3 (previously 44.6). No residual hypermetabolic surrounding lymph nodes are identified. There is no abnormal hypermetabolic activity within the liver, adrenal glands, spleen or pancreas. No hypermetabolic abdominal pelvic lymph nodes are seen. The prostate gland is mildly enlarged and demonstrates mild nonspecific central hypermetabolic activity (SUV max 5.6). This appears similar to the prior study.  SKELETON  There is no hypermetabolic activity to suggest osseous metastatic disease. There is decreased activity within the spine near the thoracolumbar junction attributed to interval radiation therapy.  IMPRESSION: 1. There is decreased hypermetabolic activity within the primary cancer involving the gastroesophageal junction and gastric cardia consistent with response to therapy. No residual hypermetabolic adjacent lymph nodes are identified. 2. No evidence of distant metastases. 3. Mild nonspecific hypermetabolic activity centrally within the prostate gland.   Electronically Signed   By: Camie Patience M.D.   On: 02/13/2014 09:52  Nm Pet Image Restag (ps) Skull Base To Thigh CLINICAL DATA: Initial treatment strategy for invasive poorly  differentiated adenocarcinoma of the esophagogastric junction.  EXAM:  NUCLEAR MEDICINE PET SKULL BASE TO THIGH  TECHNIQUE:  9.6 mCi F-18 FDG was injected intravenously. Full-ring PET imaging  was performed from the skull base to thigh after the radiotracer. CT  data was obtained and used for attenuation correction and  anatomic  localization.  FASTING BLOOD GLUCOSE: Value: 111 mg/dl  COMPARISON: CT ABD W/CM dated 10/18/2013  FINDINGS:  NECK  No hypermetabolic lymph nodes in the neck.  CHEST  No hypermetabolic mediastinal or hilar nodes. There is mild  hypermetabolic uptake in the distal esophagus, leading into the  esophagogastric junction.  No suspicious pulmonary nodules on the CT scan. Calcified lymph  nodes are seen in the right hilum with calcified right upper lobe  granuloma noted. There is some dependent atelectasis in the lung  bases with a posterior right lower lobe tiny calcified granuloma  noted.  ABDOMEN/PELVIS  Wall thickening is identified in the distal esophagus and  esophagogastric junction. Esophagogastric junction is markedly  hypermetabolic, consistent with the patient's known adenocarcinoma.  The  gastrohepatic ligament lymph node which was seen on the recent  CT scan is also hypermetabolic consistent with metastatic disease.  No abnormal hypermetabolic activity within the liver, pancreas,  adrenal glands, or spleen.  SKELETON  No focal hypermetabolic activity to suggest skeletal metastasis.  IMPRESSION:  Markedly hypermetabolic lesion at the esophagogastric junction,  consistent with the patient's history of known primary malignancy.  There is low level uptake in the distal esophagus proximal to the  dominant lesion and hypermetabolic activity is identified in the  previously described gastrohepatic ligament lymph node, consistent  with metastatic involvement.  No evidence for distant metastases in the neck, chest, or pelvis.  Electronically Signed  By: Misty Stanley M.D.  On: 11/08/2013 09:13  EUS post treatment: 3-4 cm tongue of Barrett's mucosa extending proximally from GE junction. Diffuse mucosal friability of the esophagus without overt lesion. Friable lesion seen in very proximal cardia, seemingly extending from distal margin of GE junction. The lesion encompasses  about 50% of the circumference of the lumen of the cardia. The lesion appears to invade into, but not through, the muscularis propria. There is a round hypoechoic well-defined malignant-appearing lymph node in the immediate vicinity of the mass; I did not feel the benefits of FNA outweighed the risk, as the needle would have to traverse the cardia lesion in order to reach the lymph node. Celiac axis and left lobe of liver appear grossly normal. IMPRESSION: As above. Locoregional EUS staging of GE junction adenocarcinoma, post-neoadjuvant therapy, would be T2 N1 Mx.  Recent Lab Findings: Lab Results  Component Value Date   WBC 4.8 04/10/2014   HGB 13.4 04/10/2014   HCT 41.1 04/10/2014   PLT 160 04/10/2014   GLUCOSE 119* 04/10/2014   CHOL 167 08/07/2013   TRIG 165* 08/07/2013   HDL 52 08/07/2013   LDLDIRECT 151.6 07/25/2011   LDLCALC 82 08/07/2013   ALT 17 04/10/2014   AST 24 04/10/2014   NA 144 04/10/2014   K 4.2 04/10/2014   CL 107 04/10/2014   CREATININE 1.17 04/10/2014   BUN 29* 04/10/2014   CO2 22 04/10/2014   TSH 2.581 05/01/2013   INR 1.08 04/10/2014   HGBA1C 6.2* 08/07/2013   Overall Impression: Intermediate risk stress nuclear study with a small area (6%) of anterior reversible ischemia.  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 Now s/p radiation and chemo. Plan to proceed with resection with combined approach with general surgery. Patient is aware if necessary we will extend resection into chest or neck with cervical anastomosis . He is aware he will need a feed j tube. Risks and options have been discussed with him in detail.   The goals risks and alternatives of the planned surgical procedure esophagogastrectomy poss right thoracotomy   have been discussed with the patient in detail. The risks of the procedure including death, infection,  stroke, myocardial infarction, bleeding, anastomotic leak,  blood transfusion have all been discussed specifically.  I have quoted [name] a 7 % of perioperative mortality and a complication rate as high as 30 %. The patient's questions have been answered.Danta Baumgardner Dudash is willing  to proceed with the planned procedure.  Grace Isaac MD      [address] [place] [address] Office [phone]   Beeper [phone]  04/30/2014 7:07 AM

## 2014-04-18 NOTE — Op Note (Signed)
Co-Surgeons: Kaylyn Lim, MD, FACS & Ceasar Mons MD, FACS  Anes:  General   Procedure: Laparoscopy with enterolysis; esophagogastrectomy with 21 EEA esophagogastrostomy, endoscopy, feeding jejunostomy  Diagnosis: Esophageal carcinoma  Complications: Difficulty passing NG tube  EBL:   200 cc  Drains: None  Op Time 7 hours  Description of Procedure:  The patient was taken to OR 14 at [facility] .  After anesthesia was administered and the patient was prepped a timeout was performed.  The patient Had been prepped from his neck to his groin. Access was achieved to the left upper quadrant with a 5 mm Optiview without difficulty. He had numerous adhesions throughout the anterior abdominal wall from a previous right paramedian incision for a cholecystectomy. These were taken down with the harmonic scalpel and the scissors including adhesions involving the first portion of the duodenum. This took about one hour.  When the abdominal wall was free of adhesions we put in multiple other small 5 mm trochars including left medial to the first trocar and then 2 on the right side which has subsequently upsized to a 12 mm. In the upper midline I placed a Nathanson retractor using the retractor securing it to the bed. Dissection of the foregut began by taking down the gastrohepatic window and exposing the right crus. The patient had a dimple indicating a site for his sliding hiatal hernia the may well been related to his GERD, Barrett's esophagus and subsequent esophagogastric cancer.  Short gastrics were taken down on the left side and this was carried up to the left crus. This portion of the dissection was somewhat difficult because of the inflammatory reaction probably a desmoplastic reaction related to the tumor and the reaction to this with radiation and chemotherapy. After some dissection we were able to get a Penrose drain around behind the distal esophagus. Once that was performed we used a harmonic  scalpel and dissected up into the mediastinum for a ways and got at least 5 cm of the esophageal length. We saw no gross evidence of metastatic disease to the liver.  At that point the OG tube was removed and we used a Secretary/administrator with a 6 cm purple cartridge to transect the esophagus. Prior that I placed 2 Prolene sutures above the staple line for traction. After this division occurred we then peeled the stomach downward and using the harmonic scalpel to cut off the retroperitoneum. As expected was more stuck on the left near the cardia and we carried this down to the left gastric vessel when this pedicle was divided with a vascular load using the [employer]. We came down far enough to get we felt was an adequate distal margin and then we created a gastric pouch allowing for a little more length on the greater curvature side of the stomach. This was in case we were to have to bring the stomach up into the neck. Dr. Servando Snare not thought that we would hopefully be able to do this through the abdomen if our margins were clear and we had to get the specimen for pathologic evaluation.  The stomach was transected using 6 cm purple load using the buttressing material. When this was completed the stomach was removed first by creating the left of midline a small incision in placing the GelPort. With the GelPort in place were able to extract the stomach and sent it to pathology. With the GelPort in place we then began planning our EEA anastomosis.  In the meantime  Dr. [name] reported that the proximal and distal margins were negative and on that report we plan to do this all through the abdomen and the this would not require a total esophagectomy.  Through the mouth we passed the EEA handle platform and it came down on the the carrier and G-tube and we opened that with the harmonic and brought it out through the esophageal staple line where it was pulled down and engaged. The sutures were cut  and the tube was was removed leaving the anvil in place.  We next went through the GelPort site decompressed and the abdomen grasping stomach and creating an open gastrotomy through which the 21 EEA was passed. We had passed it through the GelPort first and so were able to reestablish our pneumoperitoneum.  The EEA stapler was engaged with the handle and the crura were gently retracted and we pulled the stomach and push it up into the mediastinum and closed the GIA. This was then fired and was then withdrawn through the stomach. The resultant hole in the stomach was closed in 2 layers using the Endo Stitch and 2 of [place].  At that point I went up to endoscope the patient and was able to visualize the stapled anastomosis and looked down into the stomach. Everything appeared to be intact with an adequate blood supply and there was no evidence of bubbles or any leaks. We attempted to pass an NG tube through his nose and it may have gone into a pocket in his neck. We  asked anesthesia to attempt to at least put in a smaller NG tube above the anastomosis.  Following this we went back and performed a jejunostomy for feeding. We marched back on the small bowel to the ligament of Treitz and measured at about 40 cm distal to that point. There we performed a Witzeled red Robinson multifenistrated straight in tube placement and then sutured it to an exit site which was a prior 5 mm port. 2-0 silks were used to secure and it was secured to the skin with nylon.    The transverse incision was then closed by first closing the posterior sheath with running 2-0 PDS and then using 0 PDS on the anterior sheath. Wounds are irrigated and closed with staples. Patient tolerated the procedure well.  Because the operation lasted in excess of 7 hours patient be taken to the ICU to allow him to wake up and be extubated in that environment.   Matt B. Hassell Done, [place], [facility], [place]

## 2014-04-18 NOTE — Brief Op Note (Signed)
05/05/2014  3:44 PM  PATIENT:  Charles [name]  79 y.o. male  PRE-OPERATIVE DIAGNOSIS:  ESOPHAGEAL CANCER  POST-OPERATIVE DIAGNOSIS:  ESOPHAGEAL CANCER  PROCEDURE:  Procedure(s): LAPAROSCOPIC PROXIMAL ESOPHAGOGASTRECTOMY/DISTAL with EEA  (N/A) LAPAROSCOPIC LYSIS OF ADHESIONS (N/A) JEJUNOSTOMY (N/A)  SURGEON: Co  Surgeon:    * Kaylyn Lim, MD -cosurgeons    * Grace Isaac, MD -cosurgeons   ANESTHESIA:   general  EBL:  Total I/O In: 3000 [I.V.:3000] Out: 170 [Urine:170] Blood: less then  248ml  BLOOD ADMINISTERED:none  DRAINS: Jejunostomy Tube   LOCAL MEDICATIONS USED:  none  SPECIMEN:  Source of Specimen:  esophagogastrectomy  DISPOSITION OF SPECIMEN:  PATHOLOGY  COUNTS:  YES   DICTATION: .Dragon Dictation  PLAN OF CARE: Admit to inpatient   PATIENT DISPOSITION:  ICU - intubated and hemodynamically stable.   Delay start of Pharmacological VTE agent (>24hrs) due to surgical blood loss or risk of bleeding: yes

## 2014-04-19 ENCOUNTER — Inpatient Hospital Stay (HOSPITAL_COMMUNITY): Payer: Medicare Other

## 2014-04-19 LAB — POCT I-STAT 3, ART BLOOD GAS (G3+)
ACID-BASE DEFICIT: 3 mmol/L — AB (ref 0.0–2.0)
Bicarbonate: 20.8 mEq/L (ref 20.0–24.0)
O2 SAT: 94 %
PCO2 ART: 34.2 mmHg — AB (ref 35.0–45.0)
Patient temperature: 37
TCO2: 22 mmol/L (ref 0–100)
pH, Arterial: 7.392 (ref 7.350–7.450)
pO2, Arterial: 70 mmHg — ABNORMAL LOW (ref 80.0–100.0)

## 2014-04-19 LAB — TROPONIN I: Troponin I: <0.3 ng/mL (ref <0.30)

## 2014-04-19 LAB — BASIC METABOLIC PANEL
Anion gap: 15 (ref 5–15)
BUN: 21 mg/dL (ref 6–23)
CO2: 20 mEq/L (ref 19–32)
Calcium: 8.7 mg/dL (ref 8.4–10.5)
Chloride: 102 mEq/L (ref 96–112)
Creatinine, Ser: 1.09 mg/dL (ref 0.50–1.35)
GFR calc Af Amer: 74 mL/min — ABNORMAL LOW (ref >90)
GFR calc non Af Amer: 63 mL/min — ABNORMAL LOW (ref >90)
Glucose, Bld: 169 mg/dL — ABNORMAL HIGH (ref 70–99)
Potassium: 4.5 mEq/L (ref 3.7–5.3)
Sodium: 137 mEq/L (ref 137–147)

## 2014-04-19 LAB — CBC
HCT: 40.3 % (ref 39.0–52.0)
Hemoglobin: 13.4 g/dL (ref 13.0–17.0)
MCH: 31.6 pg (ref 26.0–34.0)
MCHC: 33.3 g/dL (ref 30.0–36.0)
MCV: 95 fL (ref 78.0–100.0)
Platelets: 135 10*3/uL — ABNORMAL LOW (ref 150–400)
RBC: 4.24 MIL/uL (ref 4.22–5.81)
RDW: 13 % (ref 11.5–15.5)
WBC: 7.7 10*3/uL (ref 4.0–10.5)

## 2014-04-19 LAB — CK TOTAL AND CKMB (NOT AT ARMC)
CK, MB: 3.5 ng/mL (ref 0.3–4.0)
Relative Index: 0.9 (ref 0.0–2.5)
Total CK: 380 U/L — ABNORMAL HIGH (ref 7–232)

## 2014-04-19 LAB — GLUCOSE, CAPILLARY
Glucose-Capillary: 176 mg/dL — ABNORMAL HIGH (ref 70–99)
Glucose-Capillary: 197 mg/dL — ABNORMAL HIGH (ref 70–99)

## 2014-04-19 MED ORDER — INFLUENZA VAC SPLIT QUAD 0.5 ML IM SUSY: 0.5000 mL | PREFILLED_SYRINGE | INTRAMUSCULAR | Status: DC | PRN

## 2014-04-19 MED ORDER — PNEUMOCOCCAL VAC POLYVALENT 25 MCG/0.5ML IJ INJ: 0.5000 mL | INJECTION | INTRAMUSCULAR | Status: DC | PRN

## 2014-04-19 MED ORDER — ENOXAPARIN SODIUM 30 MG/0.3ML ~~LOC~~ SOLN
30.0000 mg | SUBCUTANEOUS | Status: DC
  Administered 2014-04-19 – 2014-04-24 (×6): 30 mg via SUBCUTANEOUS
  Filled 2014-04-19 (×6): qty 0.3

## 2014-04-19 MED ORDER — METOPROLOL TARTRATE 1 MG/ML IV SOLN
5.0000 mg | Freq: Four times a day (QID) | INTRAVENOUS | Status: DC
  Administered 2014-04-19 – 2014-04-24 (×21): 5 mg via INTRAVENOUS
  Filled 2014-04-19 (×25): qty 5

## 2014-04-19 MED ORDER — INSULIN ASPART 100 UNIT/ML ~~LOC~~ SOLN
0.0000 [IU] | Freq: Three times a day (TID) | SUBCUTANEOUS | Status: DC
  Administered 2014-04-19 (×2): 3 [IU] via SUBCUTANEOUS

## 2014-04-19 NOTE — Progress Notes (Signed)
Patient ID: Charles [name], male   DOB: [birth year], 79 y.o.   MRN: [id number] TCTS DAILY ICU PROGRESS NOTE                   [address]            [place] [address]          [phone]   1 Day Post-Op Procedure(s) (LRB): LAPAROSCOPIC PROXIMAL ESOPHAGOGASTRECTOMY/DISTAL with EEA  (N/A) LAPAROSCOPIC LYSIS OF ADHESIONS (N/A) JEJUNOSTOMY (N/A)  Total Length of Stay:  LOS: 1 day   Subjective: Some abdominal and lower chest discomfort today, very mildly confused , complaint of left hand iv site   Objective: Vital signs in last 24 hours: Temp:  [98 F (36.7 C)-98.7 F (37.1 C)] 98 F (36.7 C) (09/12 0722) Pulse Rate:  [69-128] 99 (09/12 1100) Cardiac Rhythm:  [-] Sinus tachycardia (09/12 0750) Resp:  [9-58] 24 (09/12 1100) BP: (131-180)/(47-101) 152/97 mmHg (09/12 1100) SpO2:  [94 %-100 %] 95 % (09/12 1100) Arterial Line BP: (154-253)/(48-130) 197/74 mmHg (09/12 1100) FiO2 (%):  [40 %-50 %] 40 % (09/11 1913)  Filed Weights   05/04/2014 0600  Weight: 179 lb (81.194 kg)    Weight change:    Hemodynamic parameters for last 24 hours:    Intake/Output from previous day: 09/11 0701 - 09/12 0700 In: 6944.9 [I.V.:6564.9; IV Piggyback:350] Out: 9242 [Urine:1470]  Intake/Output this shift: Total I/O In: 675 [I.V.:545; Other:30; IV Piggyback:100] Out: 330 [Urine:330]  Current Meds: Scheduled Meds: . acetaminophen  1,000 mg Intravenous Q6H  . albuterol  2.5 mg Nebulization Q6H  . antiseptic oral rinse  7 mL Mouth Rinse QID  . cefOXitin  2 g Intravenous 4 times per day  . chlorhexidine  15 mL Mouth Rinse BID  . Chlorhexidine Gluconate Cloth  6 each Topical Daily  . fentaNYL   Intravenous 6 times per day  . metoprolol  5 mg Intravenous 4 times per day  . mupirocin ointment  1 application Nasal BID  . pantoprazole (PROTONIX) IV  40 mg Intravenous Q12H  . sodium chloride  10 mL Intravenous Q12H   Continuous Infusions: . dextrose 5 % and 0.45 % NaCl with  KCl 20 mEq/L 100 mL/hr at 04/19/14 0419  . lactated ringers 10 mL/hr (04/13/2014 1902)  . nitroGLYCERIN 90 mcg/min (04/19/14 1100)   PRN Meds:.diphenhydrAMINE, diphenhydrAMINE, fentaNYL, Influenza vac split quadrivalent PF, metoprolol, naloxone, ondansetron (ZOFRAN) IV, pneumococcal 23 valent vaccine, potassium chloride, sodium chloride  General appearance: alert and cooperative Neurologic: intact Heart: regular rate and rhythm, S1, S2 normal, no murmur, click, rub or gallop Lungs: diminished breath sounds bibasilar Abdomen: hypo active bowel sounds , no distension Extremities: extremities normal, atraumatic, no cyanosis or edema and Homans sign is negative, no sign of DVT Wound: intact  Lab Results: CBC: Recent Labs  04/17/2014 1645 04/19/14 0427  WBC 8.1 7.7  HGB 11.4* 13.4  HCT 34.4* 40.3  PLT 121* 135*   BMET:  Recent Labs  05/02/2014 1645 04/19/14 0427  NA 139 137  K 4.6 4.5  CL 105 102  CO2 23 20  GLUCOSE 151* 169*  BUN 25* 21  CREATININE 1.20 1.09  CALCIUM 8.3* 8.7    PT/INR: No results found for this basename: LABPROT, INR,  in the last 72 hours Radiology: Chest 2 View  04/16/2014   CLINICAL DATA:  Preoperative examination (esophageal cancer)  EXAM: CHEST  2 VIEW  COMPARISON:  01/17/2014; PET-CT - 02/13/2014; chest CT -  10/18/2013  FINDINGS: Grossly unchanged cardiac silhouette and mediastinal contours post median sternotomy and CABG. A loop recorder is seen within the anterior soft tissues of the medial aspect of the left chest. Unchanged punctate (approximately 6 mm) granuloma with the peripheral aspect of the right mid lung with associated calcified right hilar lymph nodes. No new focal airspace opacities. No pleural effusion or pneumothorax. No evidence of edema. There is minimal pleural parenchymal thickening about the bilateral major fissures. Stigmata of DISH within the thoracic spine. Metallic surgical sutures are seen within the midline of the imaged upper abdomen.   IMPRESSION: 1. No acute cardiopulmonary disease. 2. Sequela of prior granulomatous infection as above.   Electronically Signed   By: Sandi Mariscal M.D.   On: 04/12/2014 07:30   Dg Chest Port 1 View  04/19/2014   CLINICAL DATA:  Proximal esophageal resection status post extubation  EXAM: PORTABLE CHEST - 1 VIEW  COMPARISON:    FINDINGS: Endotracheal tube has been removed. Nasogastric tube again projects over the upper mediastinum with tip at the level of the sternoclavicular joints. Granuloma right middle lobe stable with minimal atelectatic change right lung base.  There is increased left lower lobe consolidation when compared to the prior study. Vascular pattern normal. No evidence of pulmonary edema.  IMPRESSION: New mild right lower lobe atelectasis. Interval development of more significant left lower lobe consolidation. This could reflect atelectasis or pneumonitis.   Electronically Signed   By: Skipper Cliche M.D.   On: 04/19/2014 07:53   Dg Chest Portable 1 View  04/20/2014   CLINICAL DATA:  Incorrect instrument count.  EXAM: PORTABLE CHEST - 1 VIEW  COMPARISON:  Chest x-ray 05/05/2014.  FINDINGS: Patient is intubated, with the tip of the endotracheal tube approximately 5.1 cm above the carina. An additional linear radio-opaque structure projects over the upper mediastinum, of uncertain etiology and significance. There is also a right internal jugular central venous catheter with tip terminating in the mid superior vena cava. Electronic device projecting over the left hemithorax is presumably an implantable loop recorder. Postoperative changes of median sternotomy for CABG are noted. Lung volumes are low. No consolidative airspace disease. No pleural effusions. Mild crowding of the pulmonary vasculature secondary to low lung volumes, without frank pulmonary edema. Numerous small calcified right-sided pulmonary nodules and calcified right hilar lymph nodes, related to old granulomatous disease.  Heart size is normal. Upper mediastinal contours are within normal limits. Atherosclerosis in the thoracic aorta.  IMPRESSION: 1. Support apparatus, and postoperative changes, as above. 2. There is a linear opacity projecting over the superior aspect of the mediastinum adjacent to the endotracheal tube, which is of uncertain etiology and significance. Clinical correlation is recommended to exclude the possibility of a retained instrument given the abnormal count. 3. Otherwise, there is no radiographic evidence of acute cardiopulmonary disease. These results will be called to the ordering clinician or representative by the Radiologist Assistant, and communication documented in the PACS or zVision Dashboard.   Electronically Signed   By: Vinnie Langton M.D.   On: 04/08/2014 16:26   Dg Abd Portable 1v  04/22/2014   CLINICAL DATA:  Incorrect instrument count.  EXAM: PORTABLE ABDOMEN - 1 VIEW  COMPARISON:  PET-CT 02/13/2014.  FINDINGS: No retained radiopaque foreign body is identified. Surgical drain is present in the LEFT mid abdomen. Staples are present in the midline and extending transversely across the abdomen. Bowel gas pattern appears within normal limits.  IMPRESSION: No retained radiopaque foreign body. Postsurgical  changes of the abdomen. Findings were telephoned to the technologist [facility] M) at 1623 hr who would relay report to the OR.   Electronically Signed   By: Dereck Ligas M.D.   On: 04/30/2014 16:24   EKG done today no acute changes Lab Results  Component Value Date   CKTOTAL 380* 04/19/2014   CKMB 3.5 04/19/2014   TROPONINI <0.30 04/19/2014     Assessment/Plan: S/P Procedure(s) (LRB): LAPAROSCOPIC PROXIMAL ESOPHAGOGASTRECTOMY/DISTAL with EEA  (N/A) LAPAROSCOPIC LYSIS OF ADHESIONS (N/A) JEJUNOSTOMY (N/A) Mobilize Diabetes control Continue foley due to strict I&O     Shirlean Berman B 04/19/2014 11:20 AM

## 2014-04-19 NOTE — Progress Notes (Signed)
Pt noted to be more alert s/p extubation; explanation provide r/t ordered PCA and asked if the Pt felt he would be able to properly utilize the button to which he stated that he preferred for the staff to continue to provide analgesia at this moment; will continue to assess and monitor.

## 2014-04-19 NOTE — Progress Notes (Signed)
Patient ID: Charles [name], male   DOB: 17-Mar-1935, 79 y.o.   MRN: [id number] EVENING ROUNDS NOTE :     [address]       [place], [address]             [phone]                 1 Day Post-Op Procedure(s) (LRB): LAPAROSCOPIC PROXIMAL ESOPHAGOGASTRECTOMY/DISTAL with EEA  (N/A) LAPAROSCOPIC LYSIS OF ADHESIONS (N/A) JEJUNOSTOMY (N/A)  Total Length of Stay:  LOS: 1 day  BP 164/73  Pulse 102  Temp(Src) 97.7 F (36.5 C) (Oral)  Resp 21  Ht 5' 10.87" (1.8 m)  Wt 179 lb (81.194 kg)  BMI 25.06 kg/m2  SpO2 95%  .Intake/Output     09/11 0701 - 09/12 0700 09/12 0701 - 09/13 0700   I.V. (mL/kg) 6564.9 (80.9) 655 (8.1)   Other 30 30   IV Piggyback 350 100   Total Intake(mL/kg) 6944.9 (85.5) 785 (9.7)   Urine (mL/kg/hr) 1470 (0.8) 330 (0.4)   Total Output 1470 330   Net +5474.9 +455          . dextrose 5 % and 0.45 % NaCl with KCl 20 mEq/L 100 mL/hr (04/19/14 1429)  . lactated ringers 10 mL/hr ( 1902)  . nitroGLYCERIN 90 mcg/min (04/19/14 1100)     Lab Results  Component Value Date   WBC 7.7 04/19/2014   HGB 13.4 04/19/2014   HCT 40.3 04/19/2014   PLT 135* 04/19/2014   GLUCOSE 169* 04/19/2014   CHOL 167 08/07/2013   TRIG 165* 08/07/2013   HDL 52 08/07/2013   LDLDIRECT 151.6 07/25/2011   LDLCALC 82 08/07/2013   ALT 17 04/10/2014   AST 24 04/10/2014   NA 137 04/19/2014   K 4.5 04/19/2014   CL 102 04/19/2014   CREATININE 1.09 04/19/2014   BUN 21 04/19/2014   CO2 20 04/19/2014   TSH 2.581 05/01/2013   INR 1.08 04/10/2014   HGBA1C 6.2* 08/07/2013   MICROALBUR 2.4* 07/25/2011   Starting PCA pump to help with pain control uop adequate   Grace Isaac MD  Beeper [phone] Office [phone] 04/19/2014 5:00 PM

## 2014-04-19 NOTE — Progress Notes (Signed)
1 Day Post-Op  Subjective: Awake and alert. Complains of pain but he seems to be managing it well. Very hypertensive  Objective: Vital signs in last 24 hours: Temp:  [98 F (36.7 C)-98.7 F (37.1 C)] 98 F (36.7 C) (09/12 0722) Pulse Rate:  [69-128] 107 (09/12 0700) Resp:  [9-58] 33 (09/12 0722) BP: (131-180)/(47-101) 152/77 mmHg (09/12 0700) SpO2:  [94 %-100 %] 96 % (09/12 0722) Arterial Line BP: (154-253)/(48-130) 208/74 mmHg (09/12 0700) FiO2 (%):  [40 %-50 %] 40 % (09/11 1913)    Intake/Output from previous day: 09/11 0701 - 09/12 0700 In: 6944.9 [I.V.:6564.9; IV Piggyback:350] Out: 8841 [Urine:1470] Intake/Output this shift: Total I/O In: 30 [Other:30] Out: 110 [Urine:110]  Resp: clear to auscultation bilaterally Cardio: regular rate and rhythm GI: soft, tender diffusely. dressings clean. quiet  Lab Results:   Recent Labs  05/07/2014 1645 04/19/14 0427  WBC 8.1 7.7  HGB 11.4* 13.4  HCT 34.4* 40.3  PLT 121* 135*   BMET  Recent Labs  04/11/2014 1645 04/19/14 0427  NA 139 137  K 4.6 4.5  CL 105 102  CO2 23 20  GLUCOSE 151* 169*  BUN 25* 21  CREATININE 1.20 1.09  CALCIUM 8.3* 8.7   PT/INR No results found for this basename: LABPROT, INR,  in the last 72 hours ABG  Recent Labs  04/12/2014 1942 04/19/14 0404  PHART 7.367 7.392  HCO3 22.3 20.8    Studies/Results: Chest 2 View  04/24/2014   CLINICAL DATA:  Preoperative examination (esophageal cancer)  EXAM: CHEST  2 VIEW  COMPARISON:  01/17/2014; PET-CT - 02/13/2014; chest CT - 10/18/2013  FINDINGS: Grossly unchanged cardiac silhouette and mediastinal contours post median sternotomy and CABG. A loop recorder is seen within the anterior soft tissues of the medial aspect of the left chest. Unchanged punctate (approximately 6 mm) granuloma with the peripheral aspect of the right mid lung with associated calcified right hilar lymph nodes. No new focal airspace opacities. No pleural effusion or pneumothorax.  No evidence of edema. There is minimal pleural parenchymal thickening about the bilateral major fissures. Stigmata of DISH within the thoracic spine. Metallic surgical sutures are seen within the midline of the imaged upper abdomen.  IMPRESSION: 1. No acute cardiopulmonary disease. 2. Sequela of prior granulomatous infection as above.   Electronically Signed   By: Sandi Mariscal M.D.   On: 05/02/2014 07:30   Dg Chest Port 1 View  04/19/2014   CLINICAL DATA:  Proximal esophageal resection status post extubation  EXAM: PORTABLE CHEST - 1 VIEW  COMPARISON:  04/24/2014  FINDINGS: Endotracheal tube has been removed. Nasogastric tube again projects over the upper mediastinum with tip at the level of the sternoclavicular joints. Granuloma right middle lobe stable with minimal atelectatic change right lung base.  There is increased left lower lobe consolidation when compared to the prior study. Vascular pattern normal. No evidence of pulmonary edema.  IMPRESSION: New mild right lower lobe atelectasis. Interval development of more significant left lower lobe consolidation. This could reflect atelectasis or pneumonitis.   Electronically Signed   By: Skipper Cliche M.D.   On: 04/19/2014 07:53   Dg Chest Portable 1 View  04/19/2014   CLINICAL DATA:  Incorrect instrument count.  EXAM: PORTABLE CHEST - 1 VIEW  COMPARISON:  Chest x-ray 04/21/2014.  FINDINGS: Patient is intubated, with the tip of the endotracheal tube approximately 5.1 cm above the carina. An additional linear radio-opaque structure projects over the upper mediastinum, of uncertain etiology and  significance. There is also a right internal jugular central venous catheter with tip terminating in the mid superior vena cava. Electronic device projecting over the left hemithorax is presumably an implantable loop recorder. Postoperative changes of median sternotomy for CABG are noted. Lung volumes are low. No consolidative airspace disease. No pleural effusions. Mild  crowding of the pulmonary vasculature secondary to low lung volumes, without frank pulmonary edema. Numerous small calcified right-sided pulmonary nodules and calcified right hilar lymph nodes, related to old granulomatous disease. Heart size is normal. Upper mediastinal contours are within normal limits. Atherosclerosis in the thoracic aorta.  IMPRESSION: 1. Support apparatus, and postoperative changes, as above. 2. There is a linear opacity projecting over the superior aspect of the mediastinum adjacent to the endotracheal tube, which is of uncertain etiology and significance. Clinical correlation is recommended to exclude the possibility of a retained instrument given the abnormal count. 3. Otherwise, there is no radiographic evidence of acute cardiopulmonary disease. These results will be called to the ordering clinician or representative by the Radiologist Assistant, and communication documented in the PACS or zVision Dashboard.   Electronically Signed   By: Vinnie Langton M.D.   On: 04/30/2014 16:26   Dg Abd Portable 1v  04/09/2014   CLINICAL DATA:  Incorrect instrument count.  EXAM: PORTABLE ABDOMEN - 1 VIEW  COMPARISON:  PET-CT 02/13/2014.  FINDINGS: No retained radiopaque foreign body is identified. Surgical drain is present in the LEFT mid abdomen. Staples are present in the midline and extending transversely across the abdomen. Bowel gas pattern appears within normal limits.  IMPRESSION: No retained radiopaque foreign body. Postsurgical changes of the abdomen. Findings were telephoned to the technologist [facility] M) at 1623 hr who would relay report to the OR.   Electronically Signed   By: Dereck Ligas M.D.   On: 04/14/2014 16:24    Anti-infectives: Anti-infectives   Start     Dose/Rate Route Frequency Ordered Stop   04/22/2014 1800  cefOXitin (MEFOXIN) 2 g in dextrose 5 % 50 mL IVPB     2 g 100 mL/hr over 30 Minutes Intravenous 4 times per day 05/02/2014 1601 04/19/14 1759   04/17/14  1407  cefUROXime (ZINACEF) 1.5 g in dextrose 5 % 50 mL IVPB     1.5 g 100 mL/hr over 30 Minutes Intravenous 60 min pre-op 04/17/14 1407 04/12/2014 0839      Assessment/Plan: s/p Procedure(s): LAPAROSCOPIC PROXIMAL ESOPHAGOGASTRECTOMY/DISTAL with EEA  (N/A) LAPAROSCOPIC LYSIS OF ADHESIONS (N/A) JEJUNOSTOMY (N/A) Continue ng and bowel rest Will increase IV lopressor and titrate nitroglycerin for hypertension Continue to monitor in ICU  LOS: 1 day    TOTH III,Zebulin Siegel S 04/19/2014

## 2014-04-19 NOTE — Progress Notes (Signed)
Fentanyl PCA taken out of line d/t Pt not using as instructed, will use PRN IVP instead; bilateral mittens applied per esophagogastrectomy orders. 20 cc of remaining fentanyl wasted in biobox and witnessed by 2 RN's, myself and M. Jenne Pane, Therapist, sports.

## 2014-04-19 NOTE — Op Note (Signed)
NAME:  BASSEM, BERNASCONI NO.:  [id number]  MEDICAL RECORD NO.:  [id number]  LOCATION:  2S11C                        FACILITY:  [facility]  PHYSICIAN:  Lanelle Bal, MD    DATE OF BIRTH:  April 28, 1935  DATE OF PROCEDURE:  04/17/2014 DATE OF DISCHARGE:                              OPERATIVE REPORT   PREOPERATIVE DIAGNOSIS:  Adenocarcinoma of the gastroesophageal junction, feeding J tube.  POSTOPERATIVE DIAGNOSIS:  Adenocarcinoma of the gastroesophageal junction,  Feeding J tube.  PROCEDURE:  Laparoscopic approach with enterolysis, esophagogastrectomy with 21-mm EEA, esophagogastrostomy, upper GI endoscopy, and feeding jejunostomy.  COSURGEONS: 1. Lanelle Bal, MD. Thoracic Surgery 2. Johnathan Hausen, MD. General Surgery  BRIEF HISTORY:  The patient is a 79 year old male who presented with an episode of dark stools approximately 3 months ago.  Evaluation showed a mass at the GE junction extending to the cardia with PET scan and CT scan suggestive of the local lymph node involvement.  The patient underwent a course of chemotherapy and radiation therapy.  Completing this, he had significant response and was referred for consideration of surgical resection.  The patient previously had had a cholecystectomy from a right paramedian incision.  He also had known coronary occlusive disease having had bypass surgery and subsequent need for stents.  After extensive discussion with the patient, restaging demonstrated no distant metastasis and after Cardiology clearance, the patient was agreeable with proceeding.  With the tumor predominantly being upper in stomach esophageal, this case was done as co-surgeons with General Surgery and Thoracic Surgery.  Consideration during the procedure slowly from the abdomen, but being ready to move to the right chest or neck should adequate margins not be able to be obtained.  This approach was discussed with the patient, who was  agreeable.  Risks and options had been discussed with him in detail and he was aware that with his increased age and known previous cardiac problems surgical risks was higher than average.  DESCRIPTION OF PROCEDURE:  With arterial line and central line in place, the patient underwent general anesthesia without incident.  A single- lumen endotracheal tube was placed.  The left neck, chest, and abdomen was prepped with Hibiclens and draped in usual sterile manner. Appropriate time-out was performed.  Initially, we proceeded with abdominal approach.  A 5-mm Optiview access was obtained in the left upper quadrant.  Through this, numerous adhesions to the paramedian incision were taken down with the Harmonic scalpel.  Multiple 5-mm trocars in left median and right side were placed, ultimately a total of 6 port sites.  Through the midline, a retractor was placed and dissection of the upper abdomen proceeded identifying the right crus, left crus, and dissecting around the esophagus.  A Penrose was placed around the esophagus, which facilitated further discussion of the esophageal hiatus.  We dissected with the Harmonic Scalpel along the esophagus up into the lower mediastinum approximately 5 cm.  Prolene retraction sutures were placed in the esophagus and through the ports a purple covident stapler was used to transect the esophagus.  The area posterior to the esophagus and upper stomach were dissected free.  We then entered the lesser sac and continued dissection.  A pedicle with left gastric artery was identified and this vessel was divided with a vascular stapler.  We then used serial firings of purple stapler to divide the gastric margin and then did this in a way to create somewhat of a gastric tube in case margins on the esophagus were not satisfactory and we needed to have length of stomach to move into the right chest. Just at the left of the midline lower incision, a small  transverse incision was used and a GelPort was placed.  Through this, we were able to deliver the specimen.  A specimen was sent for frozen section, Dr. Saralyn Pilar reported back to [place] and both the proximal and distal margins were free of tumor.  We then proceeded with creating the EEA anastomosis by passing an NG tube with the anvil attached down the esophagus and small opening was made in the stapled end of the esophagus and the anvil positioned appropriately and the tubes removed leaving the anvil in place.  We created an open anterior gastrotomy and through this a 21 EEA stapler was placed and brought out through the upper posterior wall of the stomach.  The firing spiked, which then was engaged with the anvil. Care was taken to make sure no extraneous tissue was caught in stapler and the stapler fired and removed two fully formed rings of tissue, were examined from the stapled anastomosis.  We then closed the open gastrotomy with 2 layer running 2-0 Surgidac.  At this point of the procedure, Dr. Hassell Done broke scrub and took the gastroscope and passed down the esophagus with examination of the anastomosis, which appeared intact.  We had some difficulty passing the NG tube in the posterior pharynx, however, with direct visualization by anesthesia and a smaller tube.  The NG tube was placed into the esophagus.  We then identified the ligament of Treitz and placed a 2-0 silk pursestring and an 18-red Robinson catheter was sutured in place and brought out through one of the port sites.  Silk sutures used to detect the site to the inner abdominal wall.  A transverse incision was then closed the posterior sheath with a running 2-0 PDS and the anterior sheath with a 0 PDS.  Wounds were irrigated and remaining port sites incisions closed with skin staples.  The needle count was reported as correct.  The instrument count showed two extra instruments, so technically since the count was incorrect an  abdominal film was obtained with no evidence of incorrect foreign material left.  The estimated blood loss was 200 mL. The patient did not require any blood products during the procedure. Because of the length of the procedure, he was left intubated and transferred to the Surgical Intensive Care Unit for further postoperative care and to slowly wean from the ventilator.     Lanelle Bal, MD     EG/MEDQ  D:  04/19/2014  T:  04/19/2014  Job:  [id number]

## 2014-04-20 ENCOUNTER — Inpatient Hospital Stay (HOSPITAL_COMMUNITY): Payer: Medicare Other

## 2014-04-20 LAB — GLUCOSE, CAPILLARY
GLUCOSE-CAPILLARY: 163 mg/dL — AB (ref 70–99)
Glucose-Capillary: 192 mg/dL — ABNORMAL HIGH (ref 70–99)

## 2014-04-20 LAB — CBC
HCT: 42.8 % (ref 39.0–52.0)
Hemoglobin: 14.2 g/dL (ref 13.0–17.0)
MCH: 31.6 pg (ref 26.0–34.0)
MCHC: 33.2 g/dL (ref 30.0–36.0)
MCV: 95.3 fL (ref 78.0–100.0)
Platelets: 141 10*3/uL — ABNORMAL LOW (ref 150–400)
RBC: 4.49 MIL/uL (ref 4.22–5.81)
RDW: 13.1 % (ref 11.5–15.5)
WBC: 12.2 10*3/uL — ABNORMAL HIGH (ref 4.0–10.5)

## 2014-04-20 LAB — BASIC METABOLIC PANEL
Anion gap: 14 (ref 5–15)
BUN: 28 mg/dL — ABNORMAL HIGH (ref 6–23)
CO2: 20 mEq/L (ref 19–32)
Calcium: 8.4 mg/dL (ref 8.4–10.5)
Chloride: 94 mEq/L — ABNORMAL LOW (ref 96–112)
Creatinine, Ser: 1.16 mg/dL (ref 0.50–1.35)
GFR calc Af Amer: 68 mL/min — ABNORMAL LOW (ref >90)
GFR calc non Af Amer: 59 mL/min — ABNORMAL LOW (ref >90)
Glucose, Bld: 176 mg/dL — ABNORMAL HIGH (ref 70–99)
Potassium: 4.7 mEq/L (ref 3.7–5.3)
Sodium: 128 mEq/L — ABNORMAL LOW (ref 137–147)

## 2014-04-20 MED ORDER — NITROGLYCERIN 2 % TD OINT
0.5000 [in_us] | TOPICAL_OINTMENT | Freq: Four times a day (QID) | TRANSDERMAL | Status: DC
  Administered 2014-04-20 – 2014-04-25 (×19): 0.5 [in_us] via TOPICAL
  Filled 2014-04-20: qty 30

## 2014-04-20 MED ORDER — AMIODARONE HCL IN DEXTROSE 360-4.14 MG/200ML-% IV SOLN
60.0000 mg/h | INTRAVENOUS | Status: AC
  Administered 2014-04-20: 60 mg/h via INTRAVENOUS

## 2014-04-20 MED ORDER — FUROSEMIDE 10 MG/ML IJ SOLN
INTRAMUSCULAR | Status: AC
  Filled 2014-04-20: qty 4

## 2014-04-20 MED ORDER — AMIODARONE HCL IN DEXTROSE 360-4.14 MG/200ML-% IV SOLN
INTRAVENOUS | Status: AC
  Filled 2014-04-20: qty 400

## 2014-04-20 MED ORDER — INSULIN ASPART 100 UNIT/ML ~~LOC~~ SOLN: 0.0000 [IU] | Freq: Four times a day (QID) | SUBCUTANEOUS | Status: DC

## 2014-04-20 MED ORDER — AMIODARONE HCL IN DEXTROSE 360-4.14 MG/200ML-% IV SOLN
30.0000 mg/h | INTRAVENOUS | Status: DC
  Administered 2014-04-21 – 2014-04-24 (×8): 30 mg/h via INTRAVENOUS
  Filled 2014-04-20 (×18): qty 200

## 2014-04-20 MED ORDER — INSULIN ASPART 100 UNIT/ML ~~LOC~~ SOLN
0.0000 [IU] | Freq: Four times a day (QID) | SUBCUTANEOUS | Status: DC
  Administered 2014-04-20: 2 [IU] via SUBCUTANEOUS
  Administered 2014-04-20 – 2014-04-21 (×3): 3 [IU] via SUBCUTANEOUS
  Administered 2014-04-21 – 2014-04-24 (×12): 2 [IU] via SUBCUTANEOUS

## 2014-04-20 MED ORDER — FUROSEMIDE 10 MG/ML IJ SOLN
40.0000 mg | Freq: Once | INTRAMUSCULAR | Status: AC
  Administered 2014-04-20: 40 mg via INTRAVENOUS

## 2014-04-20 MED ORDER — SODIUM CHLORIDE 0.9 % IV BOLUS (SEPSIS)
500.0000 mL | Freq: Once | INTRAVENOUS | Status: AC
Start: 2014-04-20 — End: 2014-04-20
  Administered 2014-04-20: 500 mL via INTRAVENOUS

## 2014-04-20 NOTE — Progress Notes (Signed)
2 Days Post-Op  Subjective: Awake and alert. Some confusion. Complains of pain  Objective: Vital signs in last 24 hours: Temp:  [97.7 F (36.5 C)-99.2 F (37.3 C)] 98.8 F (37.1 C) (09/13 0400) Pulse Rate:  [89-130] 114 (09/13 0700) Resp:  [11-45] 32 (09/13 0700) BP: (114-174)/(56-97) 114/56 mmHg (09/13 0700) SpO2:  [88 %-96 %] 93 % (09/13 0700) Arterial Line BP: (195-220)/(71-82) 195/75 mmHg (09/12 1230)    Intake/Output from previous day: 09/12 0701 - 09/13 0700 In: 3177.5 [I.V.:2987.5; IV Piggyback:100] Out: 1220 [Urine:970; Emesis/NG output:250] Intake/Output this shift:    Resp: clear to auscultation bilaterally Cardio: regular rate and rhythm and tachy GI: soft, diffuse tenderness. quiet  Lab Results:   Recent Labs  04/19/14 0427 04/20/14 0406  WBC 7.7 12.2*  HGB 13.4 14.2  HCT 40.3 42.8  PLT 135* 141*   BMET  Recent Labs  04/19/14 0427 04/20/14 0406  NA 137 128*  K 4.5 4.7  CL 102 94*  CO2 20 20  GLUCOSE 169* 176*  BUN 21 28*  CREATININE 1.09 1.16  CALCIUM 8.7 8.4   PT/INR No results found for this basename: LABPROT, INR,  in the last 72 hours ABG  Recent Labs  04/15/2014 1942 04/19/14 0404  PHART 7.367 7.392  HCO3 22.3 20.8    Studies/Results: Dg Chest Port 1 View  04/19/2014   CLINICAL DATA:  Proximal esophageal resection status post extubation  EXAM: PORTABLE CHEST - 1 VIEW  COMPARISON:  04/19/2014  FINDINGS: Endotracheal tube has been removed. Nasogastric tube again projects over the upper mediastinum with tip at the level of the sternoclavicular joints. Granuloma right middle lobe stable with minimal atelectatic change right lung base.  There is increased left lower lobe consolidation when compared to the prior study. Vascular pattern normal. No evidence of pulmonary edema.  IMPRESSION: New mild right lower lobe atelectasis. Interval development of more significant left lower lobe consolidation. This could reflect atelectasis or  pneumonitis.   Electronically Signed   By: Skipper Cliche M.D.   On: 04/19/2014 07:53   Dg Chest Portable 1 View  04/12/2014   CLINICAL DATA:  Incorrect instrument count.  EXAM: PORTABLE CHEST - 1 VIEW  COMPARISON:  Chest x-ray 05/05/2014.  FINDINGS: Patient is intubated, with the tip of the endotracheal tube approximately 5.1 cm above the carina. An additional linear radio-opaque structure projects over the upper mediastinum, of uncertain etiology and significance. There is also a right internal jugular central venous catheter with tip terminating in the mid superior vena cava. Electronic device projecting over the left hemithorax is presumably an implantable loop recorder. Postoperative changes of median sternotomy for CABG are noted. Lung volumes are low. No consolidative airspace disease. No pleural effusions. Mild crowding of the pulmonary vasculature secondary to low lung volumes, without frank pulmonary edema. Numerous small calcified right-sided pulmonary nodules and calcified right hilar lymph nodes, related to old granulomatous disease. Heart size is normal. Upper mediastinal contours are within normal limits. Atherosclerosis in the thoracic aorta.  IMPRESSION: 1. Support apparatus, and postoperative changes, as above. 2. There is a linear opacity projecting over the superior aspect of the mediastinum adjacent to the endotracheal tube, which is of uncertain etiology and significance. Clinical correlation is recommended to exclude the possibility of a retained instrument given the abnormal count. 3. Otherwise, there is no radiographic evidence of acute cardiopulmonary disease. These results will be called to the ordering clinician or representative by the Radiologist Assistant, and communication documented in the PACS  or zVision Dashboard.   Electronically Signed   By: Vinnie Langton M.D.   On: 04/16/2014 16:26   Dg Abd Portable 1v  05/04/2014   CLINICAL DATA:  Incorrect instrument count.  EXAM:  PORTABLE ABDOMEN - 1 VIEW  COMPARISON:  PET-CT 02/13/2014.  FINDINGS: No retained radiopaque foreign body is identified. Surgical drain is present in the LEFT mid abdomen. Staples are present in the midline and extending transversely across the abdomen. Bowel gas pattern appears within normal limits.  IMPRESSION: No retained radiopaque foreign body. Postsurgical changes of the abdomen. Findings were telephoned to the technologist [facility], [facility] M) at 1623 hr who would relay report to the OR.   Electronically Signed   By: Dereck Ligas M.D.   On: 05/03/2014 16:24    Anti-infectives: Anti-infectives   Start     Dose/Rate Route Frequency Ordered Stop   04/29/2014 1800  cefOXitin (MEFOXIN) 2 g in dextrose 5 % 50 mL IVPB     2 g 100 mL/hr over 30 Minutes Intravenous 4 times per day 04/12/2014 1601 04/19/14 1237   04/17/14 1407  cefUROXime (ZINACEF) 1.5 g in dextrose 5 % 50 mL IVPB     1.5 g 100 mL/hr over 30 Minutes Intravenous 60 min pre-op 04/17/14 1407 05/06/2014 0839      Assessment/Plan: s/p Procedure(s): LAPAROSCOPIC PROXIMAL ESOPHAGOGASTRECTOMY/DISTAL with EEA  (N/A) LAPAROSCOPIC LYSIS OF ADHESIONS (N/A) JEJUNOSTOMY (N/A) Continue ng and bowel rest. Continue cefoxitin HTN improved on lopressor and nitro Continue to monitor in icu  LOS: 2 days    TOTH III,Artasia Thang S 04/20/2014

## 2014-04-20 NOTE — Progress Notes (Signed)
Utilization review completed.  

## 2014-04-20 NOTE — Progress Notes (Addendum)
Patient ID: Charles [name], male   DOB: January 10, 1935, 79 y.o.   MRN: [id number] [facility] DAILY ICU PROGRESS NOTE                   [address]            [place] [address]          [phone]   2 Days Post-Op Procedure(s) (LRB): LAPAROSCOPIC PROXIMAL ESOPHAGOGASTRECTOMY/DISTAL with EEA  (N/A) LAPAROSCOPIC LYSIS OF ADHESIONS (N/A) JEJUNOSTOMY (N/A)  Total Length of Stay:  LOS: 2 days   Subjective: Up to chair  better pain control today, but not able to use pca  Objective: Vital signs in last 24 hours: Temp:  [97.7 F (36.5 C)-99.2 F (37.3 C)] 97.8 F (36.6 C) (09/13 0700) Pulse Rate:  [89-130] 115 (09/13 0900) Cardiac Rhythm:  [-] Sinus tachycardia (09/13 0800) Resp:  [11-45] 27 (09/13 0900) BP: (114-174)/(56-97) 129/63 mmHg (09/13 0900) SpO2:  [88 %-96 %] 95 % (09/13 0900) Arterial Line BP: (195-220)/(73-82) 195/75 mmHg (09/12 1230)  Filed Weights   05/05/2014 0600  Weight: 179 lb (81.194 kg)    Weight change:    Hemodynamic parameters for last 24 hours:    Intake/Output from previous day: 09/12 0701 - 09/13 0700 In: 3230 [I.V.:3040; IV Piggyback:100] Out: 1220 [Urine:970; Emesis/NG output:250]  Intake/Output this shift: Total I/O In: 110 [I.V.:110] Out: 260 [Urine:10; Emesis/NG output:250]  Current Meds: Scheduled Meds: . albuterol  2.5 mg Nebulization Q6H  . antiseptic oral rinse  7 mL Mouth Rinse QID  . chlorhexidine  15 mL Mouth Rinse BID  . Chlorhexidine Gluconate Cloth  6 each Topical Daily  . enoxaparin (LOVENOX) injection  30 mg Subcutaneous Q24H  . fentaNYL   Intravenous 6 times per day  . insulin aspart  0-15 Units Subcutaneous 4 times per day  . metoprolol  5 mg Intravenous 4 times per day  . mupirocin ointment  1 application Nasal BID  . pantoprazole (PROTONIX) IV  40 mg Intravenous Q12H  . sodium chloride  500 mL Intravenous Once  . sodium chloride  10 mL Intravenous Q12H   Continuous Infusions: . dextrose 5 % and 0.45 %  NaCl with KCl 20 mEq/L 100 mL/hr at 04/20/14 0159  . lactated ringers 10 mL/hr (04/21/2014 1902)  . nitroGLYCERIN Stopped (04/20/14 0800)   PRN Meds:.diphenhydrAMINE, diphenhydrAMINE, fentaNYL, Influenza vac split quadrivalent PF, metoprolol, naloxone, ondansetron (ZOFRAN) IV, pneumococcal 23 valent vaccine, potassium chloride, sodium chloride  General appearance: alert and mild confusion Neurologic: intact Heart: regular rate and rhythm, S1, S2 normal, no murmur, click, rub or gallop Lungs: diminished breath sounds bibasilar Abdomen: soft, non-tender; bowel sounds hypoactive; no masses,  no organomegaly Extremities: extremities normal, atraumatic, no cyanosis or edema and Homans sign is negative, no sign of DVT Wound: intact  Lab Results: CBC: Recent Labs  04/19/14 0427 04/20/14 0406  WBC 7.7 12.2*  HGB 13.4 14.2  HCT 40.3 42.8  PLT 135* 141*   BMET:  Recent Labs  04/19/14 0427 04/20/14 0406  NA 137 128*  K 4.5 4.7  CL 102 94*  CO2 20 20  GLUCOSE 169* 176*  BUN 21 28*  CREATININE 1.09 1.16  CALCIUM 8.7 8.4    PT/INR: No results found for this basename: LABPROT, INR,  in the last 72 hours Radiology: Dg Chest Port 1 View  04/19/2014   CLINICAL DATA:  Proximal esophageal resection status post extubation  EXAM: PORTABLE CHEST - 1 VIEW  COMPARISON:  05/06/2014  FINDINGS: Endotracheal tube has been removed. Nasogastric tube again projects over the upper mediastinum with tip at the level of the sternoclavicular joints. Granuloma right middle lobe stable with minimal atelectatic change right lung base.  There is increased left lower lobe consolidation when compared to the prior study. Vascular pattern normal. No evidence of pulmonary edema.  IMPRESSION: New mild right lower lobe atelectasis. Interval development of more significant left lower lobe consolidation. This could reflect atelectasis or pneumonitis.   Electronically Signed   By: Skipper Cliche M.D.   On: 04/19/2014 07:53    Dg Chest Portable 1 View  04/11/2014   CLINICAL DATA:  Incorrect instrument count.  EXAM: PORTABLE CHEST - 1 VIEW  COMPARISON:  Chest x-ray 05/01/2014.  FINDINGS: Patient is intubated, with the tip of the endotracheal tube approximately 5.1 cm above the carina. An additional linear radio-opaque structure projects over the upper mediastinum, of uncertain etiology and significance. There is also a right internal jugular central venous catheter with tip terminating in the mid superior vena cava. Electronic device projecting over the left hemithorax is presumably an implantable loop recorder. Postoperative changes of median sternotomy for CABG are noted. Lung volumes are low. No consolidative airspace disease. No pleural effusions. Mild crowding of the pulmonary vasculature secondary to low lung volumes, without frank pulmonary edema. Numerous small calcified right-sided pulmonary nodules and calcified right hilar lymph nodes, related to old granulomatous disease. Heart size is normal. Upper mediastinal contours are within normal limits. Atherosclerosis in the thoracic aorta.  IMPRESSION: 1. Support apparatus, and postoperative changes, as above. 2. There is a linear opacity projecting over the superior aspect of the mediastinum adjacent to the endotracheal tube, which is of uncertain etiology and significance. Clinical correlation is recommended to exclude the possibility of a retained instrument given the abnormal count. 3. Otherwise, there is no radiographic evidence of acute cardiopulmonary disease. These results will be called to the ordering clinician or representative by the Radiologist Assistant, and communication documented in the PACS or zVision Dashboard.   Electronically Signed   By: Vinnie Langton M.D.   On: 04/14/2014 16:26   Dg Abd Portable 1v  04/23/2014   CLINICAL DATA:  Incorrect instrument count.  EXAM: PORTABLE ABDOMEN - 1 VIEW  COMPARISON:  PET-CT 02/13/2014.  FINDINGS: No retained  radiopaque foreign body is identified. Surgical drain is present in the LEFT mid abdomen. Staples are present in the midline and extending transversely across the abdomen. Bowel gas pattern appears within normal limits.  IMPRESSION: No retained radiopaque foreign body. Postsurgical changes of the abdomen. Findings were telephoned to the technologist [facility], [facility] M) at 1623 hr who would relay report to the OR.   Electronically Signed   By: Dereck Ligas M.D.   On: 05/04/2014 16:24     Assessment/Plan: S/P Procedure(s) (LRB): LAPAROSCOPIC PROXIMAL ESOPHAGOGASTRECTOMY/DISTAL with EEA  (N/A) LAPAROSCOPIC LYSIS OF ADHESIONS (N/A) JEJUNOSTOMY (N/A) Mobilize Hyponatremia  128 today Still hypoactive bowel sounds will wait on starting tube feeding until tomorrow. Off ntg drip but better controlled , getting beta blocker iv    Amiera Herzberg B 04/20/2014 9:18 AM

## 2014-04-20 NOTE — Progress Notes (Signed)
  Amiodarone Drug - Drug Interaction Consult Note  Recommendations: Monitor for bradycardia with concurrent metoprolol and QTc prolongation with prn ondansetron orders  Amiodarone is metabolized by the cytochrome P450 system and therefore has the potential to cause many drug interactions. Amiodarone has an average plasma half-life of 50 days (range 20 to 100 days).   There is potential for drug interactions to occur several weeks or months after stopping treatment and the onset of drug interactions may be slow after initiating amiodarone.   []  Statins: Increased risk of myopathy. Simvastatin- restrict dose to 20mg  daily. Other statins: counsel patients to report any muscle pain or weakness immediately.  []  Anticoagulants: Amiodarone can increase anticoagulant effect. Consider warfarin dose reduction. Patients should be monitored closely and the dose of anticoagulant altered accordingly, remembering that amiodarone levels take several weeks to stabilize.  []  Antiepileptics: Amiodarone can increase plasma concentration of phenytoin, the dose should be reduced. Note that small changes in phenytoin dose can result in large changes in levels. Monitor patient and counsel on signs of toxicity.  [x]  Beta blockers: increased risk of bradycardia, AV block and myocardial depression. Sotalol - avoid concomitant use.  []   Calcium channel blockers (diltiazem and verapamil): increased risk of bradycardia, AV block and myocardial depression.  []   Cyclosporine: Amiodarone increases levels of cyclosporine. Reduced dose of cyclosporine is recommended.  []  Digoxin dose should be halved when amiodarone is started.  []  Diuretics: increased risk of cardiotoxicity if hypokalemia occurs.  []  Oral hypoglycemic agents (glyburide, glipizide, glimepiride): increased risk of hypoglycemia. Patient's glucose levels should be monitored closely when initiating amiodarone therapy.   [x]  Drugs that prolong the QT interval:   Torsades de pointes risk may be increased with concurrent use - avoid if possible.  Monitor QTc, also keep magnesium/potassium WNL if concurrent therapy can't be avoided. Marland Kitchen Antibiotics: e.g. fluoroquinolones, erythromycin. . Antiarrhythmics: e.g. quinidine, procainamide, disopyramide, sotalol. . Antipsychotics: e.g. phenothiazines, haloperidol.  . Lithium, tricyclic antidepressants, and methadone. Thank You,  Lawson Radar  04/20/2014 6:13 PM

## 2014-04-21 ENCOUNTER — Inpatient Hospital Stay (HOSPITAL_COMMUNITY): Payer: Medicare Other

## 2014-04-21 ENCOUNTER — Encounter (HOSPITAL_COMMUNITY): Payer: Self-pay | Admitting: Surgery

## 2014-04-21 DIAGNOSIS — I25810 Atherosclerosis of coronary artery bypass graft(s) without angina pectoris: Secondary | ICD-10-CM

## 2014-04-21 DIAGNOSIS — I10 Essential (primary) hypertension: Secondary | ICD-10-CM

## 2014-04-21 DIAGNOSIS — E8770 Fluid overload, unspecified: Secondary | ICD-10-CM | POA: Diagnosis not present

## 2014-04-21 DIAGNOSIS — I5033 Acute on chronic diastolic (congestive) heart failure: Secondary | ICD-10-CM

## 2014-04-21 DIAGNOSIS — I4891 Unspecified atrial fibrillation: Secondary | ICD-10-CM | POA: Diagnosis present

## 2014-04-21 DIAGNOSIS — Z9861 Coronary angioplasty status: Secondary | ICD-10-CM

## 2014-04-21 DIAGNOSIS — N183 Chronic kidney disease, stage 3 unspecified: Secondary | ICD-10-CM

## 2014-04-21 DIAGNOSIS — I2510 Atherosclerotic heart disease of native coronary artery without angina pectoris: Secondary | ICD-10-CM

## 2014-04-21 DIAGNOSIS — Z8673 Personal history of transient ischemic attack (TIA), and cerebral infarction without residual deficits: Secondary | ICD-10-CM

## 2014-04-21 DIAGNOSIS — I517 Cardiomegaly: Secondary | ICD-10-CM

## 2014-04-21 DIAGNOSIS — I209 Angina pectoris, unspecified: Secondary | ICD-10-CM

## 2014-04-21 DIAGNOSIS — G458 Other transient cerebral ischemic attacks and related syndromes: Secondary | ICD-10-CM

## 2014-04-21 DIAGNOSIS — C159 Malignant neoplasm of esophagus, unspecified: Secondary | ICD-10-CM

## 2014-04-21 LAB — BASIC METABOLIC PANEL
Anion gap: 11 (ref 5–15)
BUN: 30 mg/dL — ABNORMAL HIGH (ref 6–23)
CO2: 23 mEq/L (ref 19–32)
Calcium: 8.5 mg/dL (ref 8.4–10.5)
Chloride: 96 mEq/L (ref 96–112)
Creatinine, Ser: 1.13 mg/dL (ref 0.50–1.35)
GFR calc Af Amer: 70 mL/min — ABNORMAL LOW (ref >90)
GFR calc non Af Amer: 61 mL/min — ABNORMAL LOW (ref >90)
Glucose, Bld: 134 mg/dL — ABNORMAL HIGH (ref 70–99)
Potassium: 4.1 mEq/L (ref 3.7–5.3)
Sodium: 130 mEq/L — ABNORMAL LOW (ref 137–147)

## 2014-04-21 LAB — CBC
HCT: 40 % (ref 39.0–52.0)
Hemoglobin: 13.3 g/dL (ref 13.0–17.0)
MCH: 31.1 pg (ref 26.0–34.0)
MCHC: 33.3 g/dL (ref 30.0–36.0)
MCV: 93.5 fL (ref 78.0–100.0)
Platelets: 136 10*3/uL — ABNORMAL LOW (ref 150–400)
RBC: 4.28 MIL/uL (ref 4.22–5.81)
RDW: 13.1 % (ref 11.5–15.5)
WBC: 12.6 10*3/uL — ABNORMAL HIGH (ref 4.0–10.5)

## 2014-04-21 LAB — GLUCOSE, CAPILLARY
GLUCOSE-CAPILLARY: 130 mg/dL — AB (ref 70–99)
GLUCOSE-CAPILLARY: 134 mg/dL — AB (ref 70–99)
GLUCOSE-CAPILLARY: 138 mg/dL — AB (ref 70–99)
Glucose-Capillary: 138 mg/dL — ABNORMAL HIGH (ref 70–99)
Glucose-Capillary: 131 mg/dL — ABNORMAL HIGH (ref 70–99)
Glucose-Capillary: 164 mg/dL — ABNORMAL HIGH (ref 70–99)

## 2014-04-21 MED ORDER — FUROSEMIDE 10 MG/ML IJ SOLN
40.0000 mg | Freq: Once | INTRAMUSCULAR | Status: AC
  Administered 2014-04-21: 40 mg via INTRAVENOUS

## 2014-04-21 MED ORDER — VITAL HIGH PROTEIN PO LIQD: 1000.0000 mL | ORAL | Status: DC

## 2014-04-21 MED ORDER — FUROSEMIDE 10 MG/ML IJ SOLN
20.0000 mg | Freq: Once | INTRAMUSCULAR | Status: AC
  Administered 2014-04-21: 20 mg via INTRAVENOUS
  Filled 2014-04-21: qty 2

## 2014-04-21 MED ORDER — VITAL AF 1.2 CAL PO LIQD
1000.0000 mL | ORAL | Status: DC
  Administered 2014-04-21 – 2014-04-23 (×2): 1000 mL
  Filled 2014-04-21 (×5): qty 1000

## 2014-04-21 MED ORDER — AMIODARONE LOAD VIA INFUSION
150.0000 mg | Freq: Once | INTRAVENOUS | Status: AC
  Administered 2014-04-21: 150 mg via INTRAVENOUS
  Filled 2014-04-21: qty 83.34

## 2014-04-21 NOTE — Consult Note (Signed)
CARDIOLOGY CONSULT NOTE   Patient ID: Charles [name] MRN: [id number] DOB/AGE: [birth year] 79 y.o.  Admit date: 04/17/2014  Primary Physician   Kathlene November, MD Primary Cardiologist   Dr. Sallyanne Kuster Reason for Consultation   PAF  Charles [name] is a 79 y.o. male with a history of CAD.    S/p CABG 01/2013, SVG-OM & SVG-RI s/p DES of the SVG-RI, 05/2013 s/p DES of the R-PDA.   TIA 07/2013, TEE neg so Loop recorder 08/2013. Pauses on loop of 3 sec 2nd Mobitz I, plus asystole x 14 sec after PVC. PPM refused, no hx syncope.  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 Had chemo/XRT for GE CA. Surgery recommended, pre-op MV with 6% anterior reversible ischemia, med rx and OK for surgery.  Admitted 09/11 for surgery, had Laparoscopic approach with enterolysis, esophagogastrectomy with 21-mm EEA, esophagogastrostomy, upper GI endoscopy, and feeding jejunostomy. He has remained on a feeding tube since then. He had hyponatremia, rx w/ IVF, but has developed volume overload and is being diuresed. He has been tachycardic since the surgery, but was ambulating and went into rapid atrial fibrillation. He was started on amiodarone, and spontaneously converted to sinus rhythm. He has remained in sinus tachycardia, but has had frequent PVCs and one 3 bt run of NSVT.  The patient is unaware of a rapid or irregular heart rate. He denies chest pain. He is currently SOB, but Lasix is ordered. Other than acute problems secondary to recent surgery, he has no complaints.  Past Medical History  Diagnosis Date  . Hypertension   . Hyperlipidemia     diet controlled, statins and zetia intolerant  . Retinopathy     mild glaucomea  . Transient ischemic attack 1999  . Histoplasmosis   . Chronic cough 08/2009    -allergy profile- june 2,2011- neg. -Max GERD rx/ off oils January 07 2010 x 3 weeks only but no benefit. - Sinus CT rec July 11,2011- refused due to claustrophobia -MCT neg for asthma 03/01/10 -Add 1st Gen H1 July  11,2011- better  . Macular degeneration   . Coronary artery disease     CABG 01-2013, stent 04-2013    . Heart murmur     TIA  . Adenocarcinoma of gastroesophageal junction   . Stroke 1995    tia 2 years ago  . Radiation 11/19/13-12/26/13    gastroesophageal junction area 50.4 gray  . Diabetes mellitus     no meds- controls with diet  . PONV (postoperative nausea and vomiting)     extreme nausea  . Dysrhythmia     bradyarrhythmias, 2nd degree AVB with pauses 10/2013 (Dr. Sallyanne Kuster)     Past Surgical History  Procedure Laterality Date  . Cervical spine surgery  2002    plate  . Cholecystectomy  1985  . Percutaneous coronary stent intervention (pci-s)  101/31/2014    Dr Ellyn Hack, Promus Premier DES 2.25 mm x 24 mm to the PDA  . Coronary artery bypass graft N/A 01/21/2013    Procedure: Coronary Arery Bypass Grafting  Times Five Using Left Internal Mammary Artery and Right Saphenous Leg Vein Harvested Endoscopically;  Surgeon: Melrose Nakayama, MD; LIMA-LAD, SVG-RI-OM1, SVG-RCA-PDA  . Tee without cardioversion N/A 08/23/2013    Procedure: TRANSESOPHAGEAL ECHOCARDIOGRAM (TEE);  Surgeon: Sanda Klein, MD;  Location: [facility] ENDOSCOPY;  Service: Cardiovascular;  Laterality: N/A;  . Esophagogastroduodenoscopy N/A 10/17/2013    Procedure: ESOPHAGOGASTRODUODENOSCOPY (EGD);  Surgeon: Winfield Cunas., MD;  Location: [facility] ENDOSCOPY;  Service: Endoscopy;  Laterality: N/A;  . Appendectomy    . Esophagogastroduodenoscopy N/A 02/05/2014    Procedure: ESOPHAGOGASTRODUODENOSCOPY (EGD);  Surgeon: Winfield Cunas., MD;  Location: [facility] ENDOSCOPY;  Service: Endoscopy;  Laterality: N/A;  . Eus N/A 03/05/2014    Procedure: ESOPHAGEAL ENDOSCOPIC ULTRASOUND (EUS) RADIAL;  Surgeon: Arta Silence, MD;  Location: [facility] ENDOSCOPY;  Service: Endoscopy;  Laterality: N/A;  . Eye surgery    . Laparoscopic gastric resection N/A 04/11/2014    Procedure: LAPAROSCOPIC PROXIMAL ESOPHAGOGASTRECTOMY/DISTAL with EEA ;  Surgeon:  Kaylyn Lim, MD;  Location: [place];  Service: General;  Laterality: N/A;  . Laparoscopic lysis of adhesions N/A 04/13/2014    Procedure: LAPAROSCOPIC LYSIS OF ADHESIONS;  Surgeon: Kaylyn Lim, MD;  Location: [place];  Service: General;  Laterality: N/A;  . Jejunostomy N/A 04/27/2014    Procedure: Shanon Rosser;  Surgeon: Kaylyn Lim, MD;  Location: [place];  Service: General;  Laterality: N/A;    Allergies  Allergen Reactions  . Percocet [Oxycodone-Acetaminophen] Other (See Comments)    hallucinations  . Atorvastatin Other (See Comments)    Causes myalgias   . Ezetimibe Other (See Comments)    Causes dizziness  . Lovastatin Other (See Comments)    REACTION: weakness. Patient able to tolerate pravastatin  . Ranexa [Ranolazine] Other (See Comments)    Severe constipation  . Tramadol     Nausea and Vomiting    I have reviewed the patient's current medications . antiseptic oral rinse  7 mL Mouth Rinse QID  . chlorhexidine  15 mL Mouth Rinse BID  . Chlorhexidine Gluconate Cloth  6 each Topical Daily  . enoxaparin (LOVENOX) injection  30 mg Subcutaneous Q24H  . insulin aspart  0-15 Units Subcutaneous 4 times per day  . metoprolol  5 mg Intravenous 4 times per day  . mupirocin ointment  1 application Nasal BID  . nitroGLYCERIN  0.5 inch Topical 4 times per day  . pantoprazole (PROTONIX) IV  40 mg Intravenous Q12H  . sodium chloride  10 mL Intravenous Q12H   . amiodarone 30 mg/hr (04/21/14 0600)  . dextrose 5 % and 0.45 % NaCl with KCl 20 mEq/L 75 mL/hr (04/21/14 0233)  . lactated ringers 10 mL/hr (05/06/2014 1902)   fentaNYL, Influenza vac split quadrivalent PF, metoprolol, ondansetron (ZOFRAN) IV, pneumococcal 23 valent vaccine, potassium chloride  Prior to Admission medications   Medication Sig Start Date End Date Taking? Authorizing Provider  aspirin 81 MG chewable tablet Chew 81 mg by mouth daily.   Yes Historical Provider, MD  Cholecalciferol (VITAMIN D3) 2000 UNITS TABS Take 2,000  Units by mouth daily.   Yes Historical Provider, MD  Coenzyme Q10 (COQ10) 100 MG CAPS Take 100 mg by mouth daily.   Yes Historical Provider, MD  finasteride (PROSCAR) 5 MG tablet Take 5 mg by mouth every morning.    Yes Historical Provider, MD  glucose blood test strip Use as instructed to test blood glucose daily Dx: 250.00 03/03/14  Yes Brunetta Jeans, PA-C  isosorbide mononitrate (IMDUR) 60 MG 24 hr tablet Take 60 mg by mouth every morning.   Yes Historical Provider, MD  losartan (COZAAR) 50 MG tablet TAKE 1 TABLET BY MOUTH EVERY DAY 03/06/14  Yes Braedan Meuth, MD  metoprolol tartrate (LOPRESSOR) 25 MG tablet Take 25 mg by mouth 2 (two) times daily.   Yes Historical Provider, MD  pravastatin (PRAVACHOL) 40 MG tablet Take 40 mg by mouth daily.   Yes Historical Provider, MD  nitroGLYCERIN (NITROSTAT) 0.4 MG SL tablet Place 0.4 mg under the tongue every 5 (five) minutes as needed for chest pain.    Historical Provider, MD     History   Social History  . Marital Status: Married    Spouse Name: [name]    Number of Children: 2  . Years of Education: college   Occupational History  . reitred    Social History Main Topics  . Smoking status: Former Smoker -- 1.00 packs/day for 10 years    Types: Cigarettes    Quit date: 01/17/1977  . Smokeless tobacco: Never Used     Comment: Stopped at age 41   . Alcohol Use: No     Comment: seldom  . Drug Use: No  . Sexual Activity: No   Other Topics Concern  . 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 Mother Deceased   . Father Deceased    Family History  Problem Relation Age of Onset  . Coronary artery disease Neg Hx   . Stroke Neg Hx   . Colon cancer Neg Hx   . Prostate cancer Neg Hx   . Atopy Neg Hx   . Asthma Neg Hx   .  Hypertension Mother   . Hypertension Father   . Diabetes Father   . Cancer Father     colon  . Hypertension Brother   . Cancer Brother     lung  . Hypertension Sister   . Cancer Sister     lung  . Diabetes Brother   . Diabetes Sister   . Diabetes      Grandparents   . Lung cancer Brother   . Lung cancer Sister      ROS:  Full 14 point review of systems complete and found to be negative unless listed above.  Physical Exam: Blood pressure 160/72, pulse 120, temperature 97.3 F (36.3 C), temperature source Oral, resp. rate 25, height 5' 10.87" (1.8 m), weight 179 lb (81.194 kg), SpO2 89.00%.  General: Well developed, well nourished, male in moderate respiratory distress Head: Eyes PERRLA, No xanthomas.   Normocephalic and atraumatic, oropharynx without edema or exudate. Dentition: poor Lungs: Rales bilaterally Heart: HRRR S1 S2, no rub/gallop, soft murmur. pulses are 2+ all 4 extrem.   Neck: No carotid bruits. No lymphadenopathy.  JVD 10 cm. Abdomen: Bowel sounds absent, abdomen distended and tender, dressings and feeding tube in place, not disturbed. Msk: generalized weakness, no joint deformities or effusions. Extremities: No clubbing or cyanosis. No edema.  Neuro: Alert and oriented X 2. No focal deficits noted. Psych:  Good affect, responds appropriately Skin: No rashes or lesions noted.  Labs:   Lab Results  Component Value Date   WBC 12.6* 04/21/2014   HGB 13.3 04/21/2014   HCT 40.0 04/21/2014   MCV 93.5 04/21/2014   PLT 136* 04/21/2014     Recent Labs Lab 04/21/14 0339  NA 130*  K 4.1  CL 96  CO2 23  BUN 30*  CREATININE 1.13  CALCIUM 8.5  GLUCOSE 134*   Magnesium  Date Value Ref Range Status  01/22/2013 2.4  1.5 - 2.5 mg/dL Final    Recent Labs  04/19/14 0945  CKTOTAL 380*  CKMB 3.5  TROPONINI <0.30    Echo: 08/23/2013 Study Conclusions - Left ventricle: The cavity size was normal. Wall thickness was normal. Systolic function was normal. The  estimated ejection fraction was in the range of 55% to 60%. Wall motion was normal; there were no regional wall motion abnormalities. - Left atrium: No evidence of thrombus in the atrial cavity or appendage. - Right atrium: No evidence of thrombus in the atrial cavity or appendage. No evidence of thrombus in the atrial cavity or appendage. - Atrial septum: No defect or patent foramen ovale was identified. Echo contrast study showed no right-to-left atrial level shunt, following an increase in RA pressure induced by provocative maneuvers. - Tricuspid valve: No evidence of vegetation. - Pulmonic valve: No evidence of vegetation.   ECG:  04/20/2014 Atrial fib, RVR, RBBB is old Vent. rate 106 BPM PR interval * ms QRS duration 128 ms QT/QTc 358/475 ms P-R-T axes * -37 4  Radiology:  Dg Chest Port 1 View 04/21/2014   CLINICAL DATA:  Esophagogastrectomy and esophagogastrostomy. Postoperative atelectasis and pleural effusions.  EXAM: PORTABLE CHEST - 1 VIEW  COMPARISON:  04/20/2014  FINDINGS: Endotracheal tube tip is 2.3 cm above the carina. Right IJ line tip: Cavoatrial junction.  Low lung volumes are present, causing crowding of the pulmonary vasculature. Stable obscuration of both hemidiaphragms noted likely due to a combination of moderate bilateral pleural effusions and associated passive atelectasis. No overt edema in the aerated portions of the lungs.  Calcified granuloma observed peripherally in the right mid lung. Thoracic spondylosis noted.  IMPRESSION: 1. Stable appearance of the chest with bilateral pleural effusions and associated passive atelectasis.   Electronically Signed   By: Sherryl Barters M.D.   On: 04/21/2014 08:13   Dg Chest Port 1 View 04/20/2014   CLINICAL DATA:  Postoperative evaluation. Evaluate chest tube placement.  EXAM: PORTABLE CHEST - 1 VIEW  COMPARISON:  Chest x-ray 04/19/2014.  FINDINGS: Nasogastric tube in position with tip terminating in the mid to upper  esophagus and side port at the level of the thoracic inlet. Right internal jugular central venous catheter with tip terminating in the distal superior vena cava. Status post median sternotomy for CABG with LIMA. Implantable electronic device overlying the lower left hemithorax is presumably an implantable loop recorder. Small calcified granuloma in the right mid lung with multiple calcified right hilar lymph nodes. Lung volumes are low. Bibasilar opacities are favored to reflect postoperative areas of subsegmental atelectasis with associated moderate right and small left pleural effusions. Heart size is normal. Upper mediastinal contours are within normal limits. Orthopedic fixation hardware in the lower cervical spine.  IMPRESSION: 1. Support apparatus, as above.  No chest tube noted. 2. Status post esophagogastrectomy and esophagogastrostomy with bibasilar postoperative atelectasis, moderate right and small left pleural effusions.   Electronically Signed   By: Vinnie Langton M.D.   On: 04/20/2014 09:52    ASSESSMENT AND PLAN:   The patient was seen today by Dr. Sallyanne Kuster, the patient evaluated and the data reviewed.  Principal Problem:   Esophageal cancer - per TCTS  Active Problems:   Atrial fibrillation with rapid ventricular response - paroxysmal, spontaneous conversion to SR, will ck TSH and LFTs, continue IV Rx for now. IV metoprolol also.    Volume overload - will give Lasix 40 mg IV x 1 now, with low Na+, further dosing per MD.  If does not improve, may need invasive monitoring.    Hyponatremia - will have to decrease IVF with  extra volume, will change from 75 cc/hr to 40 cc/hr, otherwise, per TCTS.    DIABETES MELLITUS, TYPE II - per TCTS  Signed: Rosaria Ferries, PA-C 04/21/2014 11:53 AM Beeper [phone]  I have seen and examined the patient along with Rosaria Ferries, PA-C.  I have reviewed the chart, notes and new data.  I agree with PA's note.  Key new complaints: he appears  confused and slightly agitated Key examination changes: clearly hypervolemic - peripheral edema, +9L, tachypnea Key new findings / data: hyponatremia (receiving Dextrose containing fluids, CHF) ECG c/w PAF  PLAN: DC maintenance IV fluids altogether for now Diuretics IV amiodarone - plan to transition to PO Monitor NA and renal function Echo He has had neurological events that were presumed to be embolic (hence his ILR was implanted). He will need long term anticoagulation. Note that a single lengthy sinus pause has been detected by his ILR months ago (asymptomatic, no recurrence). Might need a dual chamber pacemaker All his meds will be administered via jejunostomy for a while - will need to pick anticoagulants and antiarrhythmics accordingly  Sanda Klein, MD, [facility] [place] [phone] 04/21/2014, 12:18 PM

## 2014-04-21 NOTE — Clinical Documentation Improvement (Signed)
Possible Clinical Conditions?    ____Hyponatremia ____Other Condition ____Cannot Clinically Determine    Diagnostics: Sodium: 9/14: 130. 9/13: 128. 9/11: 139.  Treatment: D5 1/2 sodium chloride w/20 meq kcl @ 75 cc/hr.   Thank You, Theron Arista, Clinical Documentation Specialist:  [phone]  [facility] Information Management

## 2014-04-21 NOTE — Progress Notes (Signed)
*  PRELIMINARY RESULTS* Echocardiogram 2D Echocardiogram has been performed.  Charles [name] 04/21/2014, 2:22 PM

## 2014-04-21 NOTE — Progress Notes (Deleted)
Attempted to call report

## 2014-04-21 NOTE — Progress Notes (Signed)
INITIAL NUTRITION ASSESSMENT  DOCUMENTATION CODES Per approved criteria  -Not Applicable   INTERVENTION: Initiate Vital AF @ 10 ml/hr via J-tube per MD request.   Tube feeding regimen provides 288 kcal (14% of needs), 18 grams of protein (16% of needs), and 194 ml of H2O.   NUTRITION DIAGNOSIS: Inadequate oral intake related to inability to eat as evidenced by NPO.   Goal: Pt to meet >/= 90% of their estimated nutrition needs   Monitor:  Weight trend, I/O's, respiratory status, labs  Reason for Assessment: consult for TF I&M, trickle feeds  79 y.o. male   Admitting Dx: Esophageal cancer  ASSESSMENT: 79 y.o. male is seen in the office for gastric cancer. In April of 2015 the patient noted a single tarry stool. He had no other symptoms of difficulty swallowing or painful swallowing. Dr Oletta Lamas did a UGI endoscopy and bx obtained of ulcered mass of GE junction and cardia.  9/11 pt s/p: PROCEDURE: Procedure(s):  LAPAROSCOPIC PROXIMAL ESOPHAGOGASTRECTOMY/DISTAL with EEA (N/A)  LAPAROSCOPIC LYSIS OF ADHESIONS (N/A)  JEJUNOSTOMY (N/A)  - Pt with minimal to no bowel sounds per MD note. - MD has requested trickle feeds of 10 ml/hr.  - Pt with no significant signs of fat or muscle wasting.  Labs: CBGs: 131-164 Na Low BUN elevated  Height: Ht Readings from Last 1 Encounters:  04/21/2014 5' 10.87" (1.8 m)    Weight: Wt Readings from Last 1 Encounters:  04/19/2014 179 lb (81.194 kg)    Ideal Body Weight: 75 kg  % Ideal Body Weight: 108%  Wt Readings from Last 10 Encounters:  04/13/2014 179 lb (81.194 kg)  04/15/2014 179 lb (81.194 kg)  04/10/14 179 lb 1.6 oz (81.239 kg)  03/27/14 173 lb (78.472 kg)  03/21/14 173 lb 9.6 oz (78.744 kg)  03/17/14 172 lb 14.4 oz (78.427 kg)  03/06/14 166 lb (75.297 kg)  03/03/14 172 lb 4 oz (78.132 kg)  02/21/14 163 lb (73.936 kg)  02/17/14 167 lb 11.2 oz (76.068 kg)    Usual Body Weight: 179 lbs  % Usual Body Weight: 100%  BMI:   Body mass index is 25.06 kg/(m^2).  Estimated Nutritional Needs: Kcal: 2050-2300 Protein: 115-130 g Fluid: Per MD  Skin: incision on abdomen  Diet Order: NPO  EDUCATION NEEDS: -Education not appropriate at this time   Intake/Output Summary (Last 24 hours) at 04/21/14 1300 Last data filed at 04/21/14 1200  Gross per 24 hour  Intake 3130.33 ml  Output   1940 ml  Net 1190.33 ml    Last BM: none recorded   Labs:   Recent Labs Lab 04/19/14 0427 04/20/14 0406 04/21/14 0339  NA 137 128* 130*  K 4.5 4.7 4.1  CL 102 94* 96  CO2 20 20 23   BUN 21 28* 30*  CREATININE 1.09 1.16 1.13  CALCIUM 8.7 8.4 8.5  GLUCOSE 169* 176* 134*    CBG (last 3)   Recent Labs  04/21/14 0042 04/21/14 0813 04/21/14 1205  GLUCAP 138* 131* 164*    Scheduled Meds: . antiseptic oral rinse  7 mL Mouth Rinse QID  . chlorhexidine  15 mL Mouth Rinse BID  . Chlorhexidine Gluconate Cloth  6 each Topical Daily  . enoxaparin (LOVENOX) injection  30 mg Subcutaneous Q24H  . insulin aspart  0-15 Units Subcutaneous 4 times per day  . metoprolol  5 mg Intravenous 4 times per day  . mupirocin ointment  1 application Nasal BID  . nitroGLYCERIN  0.5 inch Topical 4  times per day  . pantoprazole (PROTONIX) IV  40 mg Intravenous Q12H  . sodium chloride  10 mL Intravenous Q12H    Continuous Infusions: . amiodarone 30 mg/hr (04/21/14 0600)  . lactated ringers 10 mL/hr (04/12/2014 1902)    Past Medical History  Diagnosis Date  . Hypertension   . Hyperlipidemia     diet controlled, statins and zetia intolerant  . Retinopathy     mild glaucomea  . Transient ischemic attack 1999  . Histoplasmosis   . Chronic cough 08/2009    -allergy profile- june 2,2011- neg. -Max GERD rx/ off oils January 07 2010 x 3 weeks only but no benefit. - Sinus CT rec July 11,2011- refused due to claustrophobia -MCT neg for asthma 03/01/10 -Add 1st Gen H1 July 11,2011- better  . Macular degeneration   . Coronary artery disease      CABG 01-2013, stent 04-2013    . Heart murmur     TIA  . Adenocarcinoma of gastroesophageal junction   . Stroke 1995    tia 2 years ago  . Radiation 11/19/13-12/26/13    gastroesophageal junction area 50.4 gray  . Diabetes mellitus     no meds- controls with diet  . PONV (postoperative nausea and vomiting)     extreme nausea  . Dysrhythmia     bradyarrhythmias, 2nd degree AVB with pauses 10/2013 (Dr. Sallyanne Kuster)    Past Surgical History  Procedure Laterality Date  . Cervical spine surgery  2002    plate  . Cholecystectomy  1985  . Percutaneous coronary stent intervention (pci-s)  101/31/2014    Dr Ellyn Hack, Promus Premier DES 2.25 mm x 24 mm to the PDA  . Coronary artery bypass graft N/A 01/21/2013    Procedure: Coronary Arery Bypass Grafting  Times Five Using Left Internal Mammary Artery and Right Saphenous Leg Vein Harvested Endoscopically;  Surgeon: Melrose Nakayama, MD; LIMA-LAD, SVG-RI-OM1, SVG-RCA-PDA  . Tee without cardioversion N/A 08/23/2013    Procedure: TRANSESOPHAGEAL ECHOCARDIOGRAM (TEE);  Surgeon: Sanda Klein, MD;  Location: [facility] ENDOSCOPY;  Service: Cardiovascular;  Laterality: N/A;  . Esophagogastroduodenoscopy N/A 10/17/2013    Procedure: ESOPHAGOGASTRODUODENOSCOPY (EGD);  Surgeon: Winfield Cunas., MD;  Location: [facility] ENDOSCOPY;  Service: Endoscopy;  Laterality: N/A;  . Appendectomy    . Esophagogastroduodenoscopy N/A 02/05/2014    Procedure: ESOPHAGOGASTRODUODENOSCOPY (EGD);  Surgeon: Winfield Cunas., MD;  Location: [facility] ENDOSCOPY;  Service: Endoscopy;  Laterality: N/A;  . Eus N/A 03/05/2014    Procedure: ESOPHAGEAL ENDOSCOPIC ULTRASOUND (EUS) RADIAL;  Surgeon: Arta Silence, MD;  Location: [facility] ENDOSCOPY;  Service: Endoscopy;  Laterality: N/A;  . Eye surgery    . Laparoscopic gastric resection N/A 04/27/2014    Procedure: LAPAROSCOPIC PROXIMAL ESOPHAGOGASTRECTOMY/DISTAL with EEA ;  Surgeon: Kaylyn Lim, MD;  Location: [place];  Service: General;  Laterality: N/A;  .  Laparoscopic lysis of adhesions N/A 05/07/2014    Procedure: LAPAROSCOPIC LYSIS OF ADHESIONS;  Surgeon: Kaylyn Lim, MD;  Location: [place];  Service: General;  Laterality: N/A;  . Jejunostomy N/A 04/13/2014    Procedure: Shanon Rosser;  Surgeon: Kaylyn Lim, MD;  Location: [place];  Service: General;  Laterality: N/A;    Terrace Arabia RD, LDN

## 2014-04-21 NOTE — Progress Notes (Addendum)
      [address]       [place] [address]             [phone]      3 Days Post-Op Procedure(s) (LRB): LAPAROSCOPIC PROXIMAL ESOPHAGOGASTRECTOMY/DISTAL with EEA  (N/A) LAPAROSCOPIC LYSIS OF ADHESIONS (N/A) JEJUNOSTOMY (N/A)  Subjective:  Patient states he feels okay this morning.   He is requesting to blow his nose.  However with NG tube in place nursing staff and I explained to the patient he would not be able to do that.  Objective: Vital signs in last 24 hours: Temp:  [97.8 F (36.6 C)-98.9 F (37.2 C)] 97.8 F (36.6 C) (09/14 0400) Pulse Rate:  [82-125] 109 (09/14 0500) Cardiac Rhythm:  [-] Normal sinus rhythm (09/13 2100) Resp:  [19-34] 31 (09/14 0500) BP: (120-181)/(58-86) 181/67 mmHg (09/14 0500) SpO2:  [89 %-100 %] 93 % (09/14 0500) FiO2 (%):  [45 %-55 %] 45 % (09/13 1400)  Intake/Output from previous day: 09/13 0701 - 09/14 0700 In: 3262.7 [I.V.:2672.7; IV Piggyback:500] Out: 2325 [Urine:1725; Emesis/NG output:600] Intake/Output this shift: Total I/O In: -  Out: 75 [Urine:75]  General appearance: alert, cooperative and no distress Heart: regular rate and rhythm and tachy Lungs: clear to auscultation bilaterally Abdomen: hypoactive to minimal bowel sounds appreciated Wound: clean and dry  Lab Results:  Recent Labs  04/20/14 0406 04/21/14 0339  WBC 12.2* 12.6*  HGB 14.2 13.3  HCT 42.8 40.0  PLT 141* 136*   BMET:  Recent Labs  04/20/14 0406 04/21/14 0339  NA 128* 130*  K 4.7 4.1  CL 94* 96  CO2 20 23  GLUCOSE 176* 134*  BUN 28* 30*  CREATININE 1.16 1.13  CALCIUM 8.4 8.5    PT/INR: No results found for this basename: LABPROT, INR,  in the last 72 hours ABG    Component Value Date/Time   PHART 7.392 04/19/2014 0404   HCO3 20.8 04/19/2014 0404   TCO2 22 04/19/2014 0404   ACIDBASEDEF 3.0* 04/19/2014 0404   O2SAT 94.0 04/19/2014 0404   CBG (last 3)   Recent Labs  04/20/14 1151 04/20/14 1828 04/21/14 0042  GLUCAP  163* 138* 138*    Assessment/Plan: S/P Procedure(s) (LRB): LAPAROSCOPIC PROXIMAL ESOPHAGOGASTRECTOMY/DISTAL with EEA  (N/A) LAPAROSCOPIC LYSIS OF ADHESIONS (N/A) JEJUNOSTOMY (N/A)  1. CV- remains tachy, hypertensive- continue IV Amiodarone, Lopressor 2. Pulm- CXR with continued atelectasis, bilateral effusions, wean oxygen as tolerated 3. Renal- creatinine, lytes okay 4. GI/Nutrition- minimal to no bowel sounds appreciated, not currently on tube feeds 5. Dispo- continue current care, TF's per staff, suspect swallow study to be done later this week   LOS: 3 days    BARRETT, ERIN 04/21/2014  Mild diuresis Ambulate today Holding sinus  Start trickle tube feeding I have seen and examined [name] and agree with the above assessment  and plan.  Grace Isaac MD Beeper [phone] Office [phone] 04/21/2014 8:48 AM

## 2014-04-21 NOTE — Progress Notes (Signed)
Patient ID: Charles [name], male   DOB: 08-Jan-1935, 79 y.o.   MRN: [id number] [facility] Surgery Progress Note:   3 Days Post-Op  Subjective: Mental status is more fuzzy than yesterday.  Answers questions appropriately but is in chair on non rebreather mask.  Just got back from walk.   Objective: Vital signs in last 24 hours: Temp:  [97.3 F (36.3 C)-98.5 F (36.9 C)] 98.2 F (36.8 C) (09/14 1553) Pulse Rate:  [82-126] 108 (09/14 1600) Resp:  [19-36] 28 (09/14 1600) BP: (120-181)/(55-83) 157/64 mmHg (09/14 1600) SpO2:  [89 %-100 %] 96 % (09/14 1600) FiO2 (%):  [35 %-100 %] 100 % (09/14 1600) Weight:  [198 lb 13.7 oz (90.2 kg)] 198 lb 13.7 oz (90.2 kg) (09/14 1600)  Intake/Output from previous day: 09/13 0701 - 09/14 0700 In: 3364.4 [I.V.:2774.4; IV Piggyback:500] Out: 2325 [Urine:1725; Emesis/NG output:600] Intake/Output this shift: Total I/O In: 1098.7 [I.V.:1098.7] Out: 1150 [Urine:1150]  Physical Exam: Work of breathing is more labored today.  Abdomen is as sore as before.  NG has green material in it.   Lab Results:  Results for orders placed during the hospital encounter of 04/24/2014 (from the past 48 hour(s))  GLUCOSE, CAPILLARY     Status: Abnormal   Collection Time    04/19/14  5:39 PM      Result Value Ref Range   Glucose-Capillary 197 (*) 70 - 99 mg/dL  GLUCOSE, CAPILLARY     Status: Abnormal   Collection Time    04/20/14 12:25 AM      Result Value Ref Range   Glucose-Capillary 192 (*) 70 - 99 mg/dL   Comment 1 Documented in Chart     Comment 2 Notify RN    BASIC METABOLIC PANEL     Status: Abnormal   Collection Time    04/20/14  4:06 AM      Result Value Ref Range   Sodium 128 (*) 137 - 147 mEq/L   Comment: DELTA CHECK NOTED   Potassium 4.7  3.7 - 5.3 mEq/L   Chloride 94 (*) 96 - 112 mEq/L   Comment: DELTA CHECK NOTED   CO2 20  19 - 32 mEq/L   Glucose, Bld 176 (*) 70 - 99 mg/dL   BUN 28 (*) 6 - 23 mg/dL   Creatinine, Ser 1.16  0.50 - 1.35 mg/dL    Calcium 8.4  8.4 - 10.5 mg/dL   GFR calc non Af Amer 59 (*) >90 mL/min   GFR calc Af Amer 68 (*) >90 mL/min   Comment: (NOTE)     The eGFR has been calculated using the CKD EPI equation.     This calculation has not been validated in all clinical situations.     eGFR's persistently <90 mL/min signify possible Chronic Kidney     Disease.   Anion gap 14  5 - 15  CBC     Status: Abnormal   Collection Time    04/20/14  4:06 AM      Result Value Ref Range   WBC 12.2 (*) 4.0 - 10.5 K/uL   RBC 4.49  4.22 - 5.81 MIL/uL   Hemoglobin 14.2  13.0 - 17.0 g/dL   HCT 42.8  39.0 - 52.0 %   MCV 95.3  78.0 - 100.0 fL   MCH 31.6  26.0 - 34.0 pg   MCHC 33.2  30.0 - 36.0 g/dL   RDW 13.1  11.5 - 15.5 %   Platelets 141 (*) 150 -  400 K/uL  GLUCOSE, CAPILLARY     Status: Abnormal   Collection Time    04/20/14 11:51 AM      Result Value Ref Range   Glucose-Capillary 163 (*) 70 - 99 mg/dL  GLUCOSE, CAPILLARY     Status: Abnormal   Collection Time    04/20/14  6:28 PM      Result Value Ref Range   Glucose-Capillary 138 (*) 70 - 99 mg/dL  GLUCOSE, CAPILLARY     Status: Abnormal   Collection Time    04/21/14 12:42 AM      Result Value Ref Range   Glucose-Capillary 138 (*) 70 - 99 mg/dL   Comment 1 Documented in Chart     Comment 2 Notify RN    BASIC METABOLIC PANEL     Status: Abnormal   Collection Time    04/21/14  3:39 AM      Result Value Ref Range   Sodium 130 (*) 137 - 147 mEq/L   Potassium 4.1  3.7 - 5.3 mEq/L   Chloride 96  96 - 112 mEq/L   CO2 23  19 - 32 mEq/L   Glucose, Bld 134 (*) 70 - 99 mg/dL   BUN 30 (*) 6 - 23 mg/dL   Creatinine, Ser 1.13  0.50 - 1.35 mg/dL   Calcium 8.5  8.4 - 10.5 mg/dL   GFR calc non Af Amer 61 (*) >90 mL/min   GFR calc Af Amer 70 (*) >90 mL/min   Comment: (NOTE)     The eGFR has been calculated using the CKD EPI equation.     This calculation has not been validated in all clinical situations.     eGFR's persistently <90 mL/min signify possible Chronic  Kidney     Disease.   Anion gap 11  5 - 15  CBC     Status: Abnormal   Collection Time    04/21/14  3:39 AM      Result Value Ref Range   WBC 12.6 (*) 4.0 - 10.5 K/uL   RBC 4.28  4.22 - 5.81 MIL/uL   Hemoglobin 13.3  13.0 - 17.0 g/dL   HCT 40.0  39.0 - 52.0 %   MCV 93.5  78.0 - 100.0 fL   MCH 31.1  26.0 - 34.0 pg   MCHC 33.3  30.0 - 36.0 g/dL   RDW 13.1  11.5 - 15.5 %   Platelets 136 (*) 150 - 400 K/uL  GLUCOSE, CAPILLARY     Status: Abnormal   Collection Time    04/21/14  8:13 AM      Result Value Ref Range   Glucose-Capillary 131 (*) 70 - 99 mg/dL   Comment 1 Documented in Chart     Comment 2 Notify RN    GLUCOSE, CAPILLARY     Status: Abnormal   Collection Time    04/21/14 12:05 PM      Result Value Ref Range   Glucose-Capillary 164 (*) 70 - 99 mg/dL   Comment 1 Documented in Chart     Comment 2 Notify RN    GLUCOSE, CAPILLARY     Status: Abnormal   Collection Time    04/21/14  3:51 PM      Result Value Ref Range   Glucose-Capillary 130 (*) 70 - 99 mg/dL   Comment 1 Capillary Sample      Radiology/Results: Dg Chest Port 1 View  04/21/2014   CLINICAL DATA:  Esophagogastrectomy and esophagogastrostomy. Postoperative atelectasis  and pleural effusions.  EXAM: PORTABLE CHEST - 1 VIEW  COMPARISON:  04/20/2014  FINDINGS: Endotracheal tube tip is 2.3 cm above the carina. Right IJ line tip: Cavoatrial junction.  Low lung volumes are present, causing crowding of the pulmonary vasculature. Stable obscuration of both hemidiaphragms noted likely due to a combination of moderate bilateral pleural effusions and associated passive atelectasis. No overt edema in the aerated portions of the lungs.  Calcified granuloma observed peripherally in the right mid lung. Thoracic spondylosis noted.  IMPRESSION: 1. Stable appearance of the chest with bilateral pleural effusions and associated passive atelectasis.   Electronically Signed   By: Sherryl Barters M.D.   On: 04/21/2014 08:13   Dg Chest  Port 1 View  04/20/2014   CLINICAL DATA:  Postoperative evaluation. Evaluate chest tube placement.  EXAM: PORTABLE CHEST - 1 VIEW  COMPARISON:  Chest x-ray 04/19/2014.  FINDINGS: Nasogastric tube in position with tip terminating in the mid to upper esophagus and side port at the level of the thoracic inlet. Right internal jugular central venous catheter with tip terminating in the distal superior vena cava. Status post median sternotomy for CABG with LIMA. Implantable electronic device overlying the lower left hemithorax is presumably an implantable loop recorder. Small calcified granuloma in the right mid lung with multiple calcified right hilar lymph nodes. Lung volumes are low. Bibasilar opacities are favored to reflect postoperative areas of subsegmental atelectasis with associated moderate right and small left pleural effusions. Heart size is normal. Upper mediastinal contours are within normal limits. Orthopedic fixation hardware in the lower cervical spine.  IMPRESSION: 1. Support apparatus, as above.  No chest tube noted. 2. Status post esophagogastrectomy and esophagogastrostomy with bibasilar postoperative atelectasis, moderate right and small left pleural effusions.   Electronically Signed   By: Vinnie Langton M.D.   On: 04/20/2014 09:52    Anti-infectives: Anti-infectives   Start     Dose/Rate Route Frequency Ordered Stop   04/21/2014 1800  cefOXitin (MEFOXIN) 2 g in dextrose 5 % 50 mL IVPB     2 g 100 mL/hr over 30 Minutes Intravenous 4 times per day 04/10/2014 1601 04/19/14 1237   04/17/14 1407  cefUROXime (ZINACEF) 1.5 g in dextrose 5 % 50 mL IVPB     1.5 g 100 mL/hr over 30 Minutes Intravenous 60 min pre-op 04/17/14 1407 05/02/2014 0839      Assessment/Plan: Problem List: Patient Active Problem List   Diagnosis Date Noted  . Atrial fibrillation with rapid ventricular response 04/21/2014  . Volume overload 04/21/2014  . Esophageal cancer 04/19/2014  . Pre-operative clearance  03/17/2014  . Contact dermatitis 03/05/2014  . Gastroesophageal cancer 10/28/2013  . Second degree AV block 10/25/2013  . CVA (cerebral infarction) 08/06/2013  . TIA (transient ischemic attack) 08/06/2013  . H/O: CVA (cerebrovascular accident) 08/06/2013  . Unspecified constipation 06/14/2013  . S/P SVG-RI/OM DES 05/06/13- staged DES to RCA 06/07/13 06/08/2013  . Chronic renal insufficiency, stage III (moderate) 06/08/2013  . RBBB 06/08/2013  . Angina, class III 06/07/2013  . Anxiety and depression 05/14/2013  . CAD- S/P CABG x 5 (LIMA-LAD, SVG-RI-OM1, SVG to distal RCA, SVG-PDA) 01/21/13 01/23/2013  . Left main coronary artery disease 01/16/2013  . Abnormal nuclear cardiac imaging test - Inferior Ischemia 01/07/2013  . Yellow jacket sting 04/20/2012  . Annual physical exam 02/17/2012  . Fatigue 01/13/2012  . Onychomycosis 11/29/2010  . GLAUCOMA 12/16/2009  . DIABETES MELLITUS, TYPE II 06/07/2007  . INSOMNIA-SLEEP DISORDER-UNSPEC 05/07/2007  . HYPERLIPIDEMIA  03/01/2007  . HYPERTENSION 03/01/2007    WOB is increased since yesterday.  To begin trickle feeds tonight.   3 Days Post-Op    LOS: 3 days   Matt B. Hassell Done, MD, [facility] [phone] beeper [phone]  04/21/2014 5:03 PM

## 2014-04-22 ENCOUNTER — Inpatient Hospital Stay (HOSPITAL_COMMUNITY): Payer: Medicare Other

## 2014-04-22 LAB — BASIC METABOLIC PANEL
Anion gap: 13 (ref 5–15)
BUN: 36 mg/dL — ABNORMAL HIGH (ref 6–23)
CO2: 23 meq/L (ref 19–32)
CREATININE: 1.24 mg/dL (ref 0.50–1.35)
Calcium: 8.9 mg/dL (ref 8.4–10.5)
Chloride: 95 mEq/L — ABNORMAL LOW (ref 96–112)
GFR calc Af Amer: 63 mL/min — ABNORMAL LOW (ref >90)
GFR, EST NON AFRICAN AMERICAN: 54 mL/min — AB (ref >90)
Glucose, Bld: 123 mg/dL — ABNORMAL HIGH (ref 70–99)
Potassium: 4.1 mEq/L (ref 3.7–5.3)
SODIUM: 131 meq/L — AB (ref 137–147)

## 2014-04-22 LAB — HEPATIC FUNCTION PANEL
ALK PHOS: 60 U/L (ref 39–117)
ALT: 33 U/L (ref 0–53)
AST: 28 U/L (ref 0–37)
Albumin: 1.9 g/dL — ABNORMAL LOW (ref 3.5–5.2)
BILIRUBIN INDIRECT: 0.6 mg/dL (ref 0.3–0.9)
Bilirubin, Direct: 0.2 mg/dL (ref 0.0–0.3)
Total Bilirubin: 0.8 mg/dL (ref 0.3–1.2)
Total Protein: 5.6 g/dL — ABNORMAL LOW (ref 6.0–8.3)

## 2014-04-22 LAB — CBC
HCT: 38.5 % — ABNORMAL LOW (ref 39.0–52.0)
Hemoglobin: 13.2 g/dL (ref 13.0–17.0)
MCH: 31.8 pg (ref 26.0–34.0)
MCHC: 34.3 g/dL (ref 30.0–36.0)
MCV: 92.8 fL (ref 78.0–100.0)
Platelets: 130 10*3/uL — ABNORMAL LOW (ref 150–400)
RBC: 4.15 MIL/uL — ABNORMAL LOW (ref 4.22–5.81)
RDW: 13.4 % (ref 11.5–15.5)
WBC: 10.6 10*3/uL — ABNORMAL HIGH (ref 4.0–10.5)

## 2014-04-22 LAB — GLUCOSE, CAPILLARY
Glucose-Capillary: 106 mg/dL — ABNORMAL HIGH (ref 70–99)
Glucose-Capillary: 121 mg/dL — ABNORMAL HIGH (ref 70–99)
Glucose-Capillary: 130 mg/dL — ABNORMAL HIGH (ref 70–99)
Glucose-Capillary: 143 mg/dL — ABNORMAL HIGH (ref 70–99)

## 2014-04-22 LAB — TSH: TSH: 4.15 u[IU]/mL (ref 0.350–4.500)

## 2014-04-22 MED ORDER — FUROSEMIDE 10 MG/ML IJ SOLN
80.0000 mg | Freq: Two times a day (BID) | INTRAMUSCULAR | Status: DC
  Administered 2014-04-22: 80 mg via INTRAVENOUS
  Filled 2014-04-22 (×3): qty 8

## 2014-04-22 MED ORDER — FUROSEMIDE 10 MG/ML IJ SOLN
40.0000 mg | Freq: Once | INTRAMUSCULAR | Status: AC
  Administered 2014-04-22: 40 mg via INTRAVENOUS
  Filled 2014-04-22: qty 4

## 2014-04-22 NOTE — Progress Notes (Signed)
INITIAL NUTRITION ASSESSMENT  DOCUMENTATION CODES Per approved criteria  -Not Applicable   INTERVENTION:  As medically appropriate, increase Vital AF 1.2 formula by 10 ml every 6 to 8 hours to goal rate of 75 ml/hr to provide 2160 kcals, 135 gm protein, 1460 ml of free water RD to follow for nutrition care plan  NUTRITION DIAGNOSIS: Inadequate oral intake related to inability to eat, s/p esophagogastrectomy as evidenced by NPO, ongoing  Goal: Pt to meet >/= 90% of their estimated nutrition needs, currently unmet  Monitor:  TF regimen & tolerance, weight, labs, I/O's  ASSESSMENT: 79 y.o. male is seen in the office for gastric cancer. In April of 2015 the patient noted a single tarry stool. He had no other symptoms of difficulty swallowing or painful swallowing. Dr Oletta Lamas did a UGI endoscopy and bx obtained of ulcered mass of GE junction and cardia.  Patient s/p procedures 9/11: LAPAROSCOPIC PROXIMAL ESOPHAGOGASTRECTOMY/DISTAL with EEA LAPAROSCOPIC LYSIS OF ADHESIONS  JEJUNOSTOMY   Vital AF 1.2 formula currently infusing at 10 ml/hr via J-tube providing 288 kcals, 18 grams of protein and 194 ml of free water.  Tolerating well.  Of note, patient was being followed by outpatient RD at [facility] since [place] 2015.  Notes reviewed.  Prior to surgery, pt was experiencing stomach pains with eating and drinking, however, overall appetite was good.  Height: Ht Readings from Last 1 Encounters:  04/09/2014 5' 10.87" (1.8 m)    Weight: Wt Readings from Last 1 Encounters:  04/22/14 192 lb 11.2 oz (87.408 kg)    BMI:  Body mass index is 26.98 kg/(m^2).  Re-estimated Nutritional Needs: Kcal: 2100-2300 Protein: 130-140 gm Fluid: per MD  Skin: incision on abdomen  Diet Order: NPO   Intake/Output Summary (Last 24 hours) at 04/22/14 1043 Last data filed at 04/22/14 0900  Gross per 24 hour  Intake 1487.5 ml  Output   1856 ml  Net -368.5 ml    Labs:   Recent  Labs Lab 04/20/14 0406 04/21/14 0339 04/22/14 0440  NA 128* 130* 131*  K 4.7 4.1 4.1  CL 94* 96 95*  CO2 20 23 23   BUN 28* 30* 36*  CREATININE 1.16 1.13 1.24  CALCIUM 8.4 8.5 8.9  GLUCOSE 176* 134* 123*    CBG (last 3)   Recent Labs  04/21/14 1551 04/21/14 2333 04/22/14 0552  GLUCAP 130* 134* 106*    Scheduled Meds: . antiseptic oral rinse  7 mL Mouth Rinse QID  . chlorhexidine  15 mL Mouth Rinse BID  . Chlorhexidine Gluconate Cloth  6 each Topical Daily  . enoxaparin (LOVENOX) injection  30 mg Subcutaneous Q24H  . furosemide  80 mg Intravenous BID  . insulin aspart  0-15 Units Subcutaneous 4 times per day  . metoprolol  5 mg Intravenous 4 times per day  . mupirocin ointment  1 application Nasal BID  . nitroGLYCERIN  0.5 inch Topical 4 times per day  . pantoprazole (PROTONIX) IV  40 mg Intravenous Q12H  . sodium chloride  10 mL Intravenous Q12H    Continuous Infusions: . amiodarone 30 mg/hr (04/22/14 0700)  . feeding supplement (VITAL AF 1.2 CAL) 1,000 mL (04/22/14 0700)  . lactated ringers 10 mL/hr (04/24/2014 1902)    Past Medical History  Diagnosis Date  . Hypertension   . Hyperlipidemia     diet controlled, statins and zetia intolerant  . Retinopathy     mild glaucomea  . Transient ischemic attack 1999  . Histoplasmosis   .  Chronic cough 08/2009    -allergy profile- june 2,2011- neg. -Max GERD rx/ off oils January 07 2010 x 3 weeks only but no benefit. - Sinus CT rec July 11,2011- refused due to claustrophobia -MCT neg for asthma 03/01/10 -Add 1st Gen H1 July 11,2011- better  . Macular degeneration   . Coronary artery disease     CABG 01-2013, stent 04-2013    . Heart murmur     TIA  . Adenocarcinoma of gastroesophageal junction   . Stroke 1995    tia 2 years ago  . Radiation 11/19/13-12/26/13    gastroesophageal junction area 50.4 gray  . Diabetes mellitus     no meds- controls with diet  . PONV (postoperative nausea and vomiting)     extreme nausea  .  Dysrhythmia     bradyarrhythmias, 2nd degree AVB with pauses 10/2013 (Dr. Sallyanne Kuster)    Past Surgical History  Procedure Laterality Date  . Cervical spine surgery  2002    plate  . Cholecystectomy  1985  . Percutaneous coronary stent intervention (pci-s)  101/31/2014    Dr Ellyn Hack, Promus Premier DES 2.25 mm x 24 mm to the PDA  . Coronary artery bypass graft N/A 01/21/2013    Procedure: Coronary Arery Bypass Grafting  Times Five Using Left Internal Mammary Artery and Right Saphenous Leg Vein Harvested Endoscopically;  Surgeon: Melrose Nakayama, MD; LIMA-LAD, SVG-RI-OM1, SVG-RCA-PDA  . Tee without cardioversion N/A 08/23/2013    Procedure: TRANSESOPHAGEAL ECHOCARDIOGRAM (TEE);  Surgeon: Sanda Klein, MD;  Location: [facility] ENDOSCOPY;  Service: Cardiovascular;  Laterality: N/A;  . Esophagogastroduodenoscopy N/A 10/17/2013    Procedure: ESOPHAGOGASTRODUODENOSCOPY (EGD);  Surgeon: Winfield Cunas., MD;  Location: [facility] ENDOSCOPY;  Service: Endoscopy;  Laterality: N/A;  . Appendectomy    . Esophagogastroduodenoscopy N/A 02/05/2014    Procedure: ESOPHAGOGASTRODUODENOSCOPY (EGD);  Surgeon: Winfield Cunas., MD;  Location: [facility] ENDOSCOPY;  Service: Endoscopy;  Laterality: N/A;  . Eus N/A 03/05/2014    Procedure: ESOPHAGEAL ENDOSCOPIC ULTRASOUND (EUS) RADIAL;  Surgeon: Arta Silence, MD;  Location: [facility] ENDOSCOPY;  Service: Endoscopy;  Laterality: N/A;  . Eye surgery    . Laparoscopic gastric resection N/A 04/11/2014    Procedure: LAPAROSCOPIC PROXIMAL ESOPHAGOGASTRECTOMY/DISTAL with EEA ;  Surgeon: Kaylyn Lim, MD;  Location: [place];  Service: General;  Laterality: N/A;  . Laparoscopic lysis of adhesions N/A 05/01/2014    Procedure: LAPAROSCOPIC LYSIS OF ADHESIONS;  Surgeon: Kaylyn Lim, MD;  Location: [place];  Service: General;  Laterality: N/A;  . Jejunostomy N/A 04/19/2014    Procedure: Shanon Rosser;  Surgeon: Kaylyn Lim, MD;  Location: [place];  Service: General;  Laterality: N/A;    Arthur Holms,  RD, LDN Pager #: [phone] After-Hours Pager #: [phone]

## 2014-04-22 NOTE — Progress Notes (Signed)
Patient ID: Charles [name], male   DOB: May 25, 1935, 79 y.o.   MRN: [id number]  Cardiothoracic Surgery:  Hemodynamically stable today  Maintaining sinus 100 on IV amio  Urine output ok  Tolerating TF at 10.  Intermittent confusion. Has mitts on.

## 2014-04-22 NOTE — Clinical Documentation Improvement (Signed)
Possible Clinical Conditions?   Chronic Systolic Congestive Heart Failure Chronic Diastolic Congestive Heart Failure Chronic Systolic & Diastolic Congestive Heart Failure Acute Systolic Congestive Heart Failure Acute Diastolic Congestive Heart Failure Acute Systolic & Diastolic Congestive Heart Failure Acute on Chronic Systolic Congestive Heart Failure Acute on Chronic Diastolic Congestive Heart Failure Acute on Chronic Systolic & Diastolic Congestive Heart Failure Other Condition Cannot Clinically Determine   Risk Factors: Hypervolemic, peripheral edema, + 9L, CHF per 9/14 progress notes. Increase diuretics per 9/15 progress notes.   Thank You, Theron Arista, Clinical Documentation Specialist:  [phone]  [place] Information Management

## 2014-04-22 NOTE — Progress Notes (Signed)
Patient ID: Charles [name], male   DOB: 1934/09/13, 79 y.o.   MRN: [id number] [facility] Surgery Progress Note:   4 Days Post-Op  Subjective: Mental status is more alert;  Feels better Objective: Vital signs in last 24 hours: Temp:  [97.5 F (36.4 C)-98.8 F (37.1 C)] 98.8 F (37.1 C) (09/15 1211) Pulse Rate:  [83-110] 91 (09/15 1500) Resp:  [0-33] 19 (09/15 1500) BP: (124-183)/(56-80) 160/61 mmHg (09/15 1500) SpO2:  [90 %-100 %] 95 % (09/15 1500) Weight:  [192 lb 11.2 oz (87.408 kg)] 192 lb 11.2 oz (87.408 kg) (09/15 0500)  Intake/Output from previous day: 09/14 0701 - 09/15 0700 In: 1719.2 [I.V.:1499.2; NG/GT:130] Out: 2031 [Urine:1781; Emesis/NG output:250] Intake/Output this shift: Total I/O In: 293.6 [I.V.:213.6; NG/GT:80] Out: 700 [Urine:700]  Physical Exam: 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  NG with green discharge.  Incisions ok.  Trickle feeds going at 10cc/hr.    Lab Results:  Results for orders placed during the hospital encounter of 04/30/2014 (from the past 48 hour(s))  GLUCOSE, CAPILLARY     Status: Abnormal   Collection Time    04/20/14  6:28 PM      Result Value Ref Range   Glucose-Capillary 138 (*) 70 - 99 mg/dL  GLUCOSE, CAPILLARY     Status: Abnormal   Collection Time    04/21/14 12:42 AM      Result Value Ref Range   Glucose-Capillary 138 (*) 70 - 99 mg/dL   Comment 1 Documented in Chart     Comment 2 Notify RN    BASIC METABOLIC PANEL     Status: Abnormal   Collection Time    04/21/14  3:39 AM      Result Value Ref Range   Sodium 130 (*) 137 - 147 mEq/L   Potassium 4.1  3.7 - 5.3 mEq/L   Chloride 96  96 - 112 mEq/L   CO2 23  19 - 32 mEq/L   Glucose, Bld 134 (*) 70 - 99 mg/dL   BUN 30 (*) 6 - 23 mg/dL   Creatinine, Ser 1.13  0.50 - 1.35 mg/dL   Calcium 8.5  8.4 - 10.5 mg/dL   GFR calc non Af Amer 61 (*) >90 mL/min   GFR calc Af Amer 70 (*) >90 mL/min   Comment: (NOTE)     The eGFR has been calculated using the CKD EPI  equation.     This calculation has not been validated in all clinical situations.     eGFR's persistently <90 mL/min signify possible Chronic Kidney     Disease.   Anion gap 11  5 - 15  CBC     Status: Abnormal   Collection Time    04/21/14  3:39 AM      Result Value Ref Range   WBC 12.6 (*) 4.0 - 10.5 K/uL   RBC 4.28  4.22 - 5.81 MIL/uL   Hemoglobin 13.3  13.0 - 17.0 g/dL   HCT 40.0  39.0 - 52.0 %   MCV 93.5  78.0 - 100.0 fL   MCH 31.1  26.0 - 34.0 pg   MCHC 33.3  30.0 - 36.0 g/dL   RDW 13.1  11.5 - 15.5 %   Platelets 136 (*) 150 - 400 K/uL  GLUCOSE, CAPILLARY     Status: Abnormal   Collection Time    04/21/14  8:13 AM      Result Value Ref Range   Glucose-Capillary 131 (*) 70 - 99  mg/dL   Comment 1 Documented in Chart     Comment 2 Notify RN    GLUCOSE, CAPILLARY     Status: Abnormal   Collection Time    04/21/14 12:05 PM      Result Value Ref Range   Glucose-Capillary 164 (*) 70 - 99 mg/dL   Comment 1 Documented in Chart     Comment 2 Notify RN    GLUCOSE, CAPILLARY     Status: Abnormal   Collection Time    04/21/14  3:51 PM      Result Value Ref Range   Glucose-Capillary 130 (*) 70 - 99 mg/dL   Comment 1 Capillary Sample    GLUCOSE, CAPILLARY     Status: Abnormal   Collection Time    04/21/14 11:33 PM      Result Value Ref Range   Glucose-Capillary 134 (*) 70 - 99 mg/dL  CBC     Status: Abnormal   Collection Time    04/22/14  4:40 AM      Result Value Ref Range   WBC 10.6 (*) 4.0 - 10.5 K/uL   RBC 4.15 (*) 4.22 - 5.81 MIL/uL   Hemoglobin 13.2  13.0 - 17.0 g/dL   HCT 38.5 (*) 39.0 - 52.0 %   MCV 92.8  78.0 - 100.0 fL   MCH 31.8  26.0 - 34.0 pg   MCHC 34.3  30.0 - 36.0 g/dL   RDW 13.4  11.5 - 15.5 %   Platelets 130 (*) 150 - 400 K/uL  BASIC METABOLIC PANEL     Status: Abnormal   Collection Time    04/22/14  4:40 AM      Result Value Ref Range   Sodium 131 (*) 137 - 147 mEq/L   Potassium 4.1  3.7 - 5.3 mEq/L   Chloride 95 (*) 96 - 112 mEq/L   CO2 23  19  - 32 mEq/L   Glucose, Bld 123 (*) 70 - 99 mg/dL   BUN 36 (*) 6 - 23 mg/dL   Creatinine, Ser 1.24  0.50 - 1.35 mg/dL   Calcium 8.9  8.4 - 10.5 mg/dL   GFR calc non Af Amer 54 (*) >90 mL/min   GFR calc Af Amer 63 (*) >90 mL/min   Comment: (NOTE)     The eGFR has been calculated using the CKD EPI equation.     This calculation has not been validated in all clinical situations.     eGFR's persistently <90 mL/min signify possible Chronic Kidney     Disease.   Anion gap 13  5 - 15  GLUCOSE, CAPILLARY     Status: Abnormal   Collection Time    04/22/14  5:52 AM      Result Value Ref Range   Glucose-Capillary 106 (*) 70 - 99 mg/dL   Comment 1 Capillary Sample    TSH     Status: None   Collection Time    04/22/14 10:00 AM      Result Value Ref Range   TSH 4.150  0.350 - 4.500 uIU/mL  HEPATIC FUNCTION PANEL     Status: Abnormal   Collection Time    04/22/14 10:00 AM      Result Value Ref Range   Total Protein 5.6 (*) 6.0 - 8.3 g/dL   Albumin 1.9 (*) 3.5 - 5.2 g/dL   AST 28  0 - 37 U/L   Comment: HEMOLYSIS AT THIS LEVEL MAY AFFECT RESULT   ALT  33  0 - 53 U/L   Alkaline Phosphatase 60  39 - 117 U/L   Total Bilirubin 0.8  0.3 - 1.2 mg/dL   Bilirubin, Direct 0.2  0.0 - 0.3 mg/dL   Comment: HEMOLYSIS AT THIS LEVEL MAY AFFECT RESULT   Indirect Bilirubin 0.6  0.3 - 0.9 mg/dL  GLUCOSE, CAPILLARY     Status: Abnormal   Collection Time    04/22/14 12:08 PM      Result Value Ref Range   Glucose-Capillary 143 (*) 70 - 99 mg/dL   Comment 1 Capillary Sample      Radiology/Results: Dg Chest Port 1 View  04/22/2014   CLINICAL DATA:  79 year old male, status post laparoscopic esophagogastrectomy  EXAM: PORTABLE CHEST - 1 VIEW  COMPARISON:  Multiple prior chest x-ray, most recent 04/21/2014 appearing and prior PET-CT 02/13/2014 and 11/08/2013.  FINDINGS: Aeration of the lungs is similar to the comparison with dense basilar opacities obscuring the heart borders and hemidiaphragms. No visualized  pneumothorax.  Cardiomediastinal silhouette partially obscured by overlying lung and pleural disease. Heart size relatively unchanged.  Unchanged position of right IJ central line which appears to terminate superior vena cava. Surgical drain overlies the thoracic inlet. Surgical changes of prior median sternotomy. Overlying EKG leads.  IMPRESSION: Relatively unchanged appearance of the chest x-ray with dense basilar opacities likely reflecting combination of pleural fluid, atelectasis, and/or consolidated changes of the lungs.  Unchanged support apparatus.  Signed,  Dulcy Fanny. Earleen Newport, DO  [facility]  [facility]   Electronically Signed   By: Corrie Mckusick O.D.   On: 04/22/2014 08:10   Dg Chest Port 1 View  04/21/2014   CLINICAL DATA:  Esophagogastrectomy and esophagogastrostomy. Postoperative atelectasis and pleural effusions.  EXAM: PORTABLE CHEST - 1 VIEW  COMPARISON:  04/20/2014  FINDINGS: Endotracheal tube tip is 2.3 cm above the carina. Right IJ line tip: Cavoatrial junction.  Low lung volumes are present, causing crowding of the pulmonary vasculature. Stable obscuration of both hemidiaphragms noted likely due to a combination of moderate bilateral pleural effusions and associated passive atelectasis. No overt edema in the aerated portions of the lungs.  Calcified granuloma observed peripherally in the right mid lung. Thoracic spondylosis noted.  IMPRESSION: 1. Stable appearance of the chest with bilateral pleural effusions and associated passive atelectasis.   Electronically Signed   By: Sherryl Barters M.D.   On: 04/21/2014 08:13    Anti-infectives: Anti-infectives   Start     Dose/Rate Route Frequency Ordered Stop   04/10/2014 1800  cefOXitin (MEFOXIN) 2 g in dextrose 5 % 50 mL IVPB     2 g 100 mL/hr over 30 Minutes Intravenous 4 times per day 05/04/2014 1601 04/19/14 1237   04/17/14 1407  cefUROXime (ZINACEF) 1.5 g in dextrose 5 % 50 mL IVPB     1.5  g 100 mL/hr over 30 Minutes Intravenous 60 min pre-op 04/17/14 1407 05/03/2014 0839      Assessment/Plan: Problem List: Patient Active Problem List   Diagnosis Date Noted  . Atrial fibrillation with rapid ventricular response 04/21/2014  . Volume overload 04/21/2014  . Esophageal cancer 04/20/2014  . Pre-operative clearance 03/17/2014  . Contact dermatitis 03/05/2014  . Gastroesophageal cancer 10/28/2013  . Second degree AV block 10/25/2013  . CVA (cerebral infarction) 08/06/2013  . TIA (transient ischemic attack) 08/06/2013  . H/O: CVA (cerebrovascular accident) 08/06/2013  . Unspecified constipation 06/14/2013  . S/P SVG-RI/OM DES 05/06/13- staged DES to RCA 06/07/13 06/08/2013  .  Chronic renal insufficiency, stage III (moderate) 06/08/2013  . RBBB 06/08/2013  . Angina, class III 06/07/2013  . Anxiety and depression 05/14/2013  . CAD- S/P CABG x 5 (LIMA-LAD, SVG-RI-OM1, SVG to distal RCA, SVG-PDA) 01/21/13 01/23/2013  . Left main coronary artery disease 01/16/2013  . Abnormal nuclear cardiac imaging test - Inferior Ischemia 01/07/2013  . Yellow jacket sting 04/20/2012  . Annual physical exam 02/17/2012  . Fatigue 01/13/2012  . Onychomycosis 11/29/2010  . GLAUCOMA 12/16/2009  . DIABETES MELLITUS, TYPE II 06/07/2007  . INSOMNIA-SLEEP DISORDER-UNSPEC 05/07/2007  . HYPERLIPIDEMIA 03/01/2007  . HYPERTENSION 03/01/2007    Path noted.  1/4 nodes +,  Margins negative Looks better today than yesterday.   4 Days Post-Op    LOS: 4 days   Matt B. Hassell Done, MD, [facility] [phone] beeper [phone]  04/22/2014 5:26 PM

## 2014-04-22 NOTE — Progress Notes (Signed)
Patient Name: Charles [name] Date of Encounter: 04/22/2014  Principal Problem:   Esophageal cancer Active Problems:   Atrial fibrillation with rapid ventricular response   Volume overload   DIABETES MELLITUS, TYPE II    Patient Profile: 79 yo male w/ hx CABG and DES of SVG-RI & PDA  TIA in 2014, loop recorder w/ > 5 sec pause, PPM refused due to dx esoph CA. Admitted after chemo/XRT 09/11 for surgery. Cards seeing for PAF, also w/ some volume overload.   SUBJECTIVE: Feels a little better today. Looks more alert.  OBJECTIVE Filed Vitals:   04/22/14 0500 04/22/14 0600 04/22/14 0700 04/22/14 0749  BP: 165/60 139/65 140/63   Pulse: 110 93 94   Temp:    97.5 F (36.4 C)  TempSrc:    Axillary  Resp: 25 22 26    Height:      Weight: 192 lb 11.2 oz (87.408 kg)     SpO2: 93% 94% 98%     Intake/Output Summary (Last 24 hours) at 04/22/14 0853 Last data filed at 04/22/14 0700  Gross per 24 hour  Intake 1597.5 ml  Output   1956 ml  Net -358.5 ml   Filed Weights   05/05/2014 0600 04/21/14 1600 04/22/14 0500  Weight: 179 lb (81.194 kg) 198 lb 13.7 oz (90.2 kg) 192 lb 11.2 oz (87.408 kg)    PHYSICAL EXAM General: Well developed, well nourished, male in no acute distress. Head: Normocephalic, atraumatic.  Neck: Supple without bruits, JVD. Lungs:  Resp regular and unlabored, CTA. Heart: RRR, S1, S2, no S3, S4, or murmur; no rub. Abdomen: Soft, non-tender, non-distended, BS + x 4.  Extremities: No clubbing, cyanosis, edema.  Neuro: Alert and oriented X 3. Moves all extremities spontaneously. Psych: Normal affect.  LABS: CBC: Recent Labs  04/21/14 0339 04/22/14 0440  WBC 12.6* 10.6*  HGB 13.3 13.2  HCT 40.0 38.5*  MCV 93.5 92.8  PLT 136* 500*   Basic Metabolic Panel: Recent Labs  04/21/14 0339 04/22/14 0440  NA 130* 131*  K 4.1 4.1  CL 96 95*  CO2 23 23  GLUCOSE 134* 123*  BUN 30* 36*  CREATININE 1.13 1.24  CALCIUM 8.5 8.9   Liver Function Tests & TSH:  pending  Cardiac Enzymes: Recent Labs  04/19/14 0945  CKTOTAL 380*  CKMB 3.5  TROPONINI <0.30   TELE:  SR with possible short run PAF, artifact so not clear.      ECHO: 04/21/2014  Study Conclusions - Procedure narrative: Transthoracic echocardiography. Technically difficult study with reduced echo windows. - Left ventricle: The cavity size was normal. Wall thickness was increased in a pattern of moderate LVH. Systolic function was vigorous. The estimated ejection fraction was in the range of 65% to 70%. Wall motion was normal; there were no regional wall motion abnormalities. The study is not technically sufficient to allow evaluation of LV diastolic function. - Left atrium: The atrium was normal in size. - Right atrium: The atrium was normal in size. Impressions: - Compared to the TEE in 08/2013, the only change is hyperdynamic LV function.  Radiology/Studies: Dg Chest Port 1 View 04/22/2014   CLINICAL DATA:  79 year old male, status post laparoscopic esophagogastrectomy  EXAM: PORTABLE CHEST - 1 VIEW  COMPARISON:  Multiple prior chest x-ray, most recent 04/21/2014 appearing and prior PET-CT 02/13/2014 and 11/08/2013.  FINDINGS: Aeration of the lungs is similar to the comparison with dense basilar opacities obscuring the heart borders and hemidiaphragms. No visualized pneumothorax.  Cardiomediastinal silhouette partially obscured by overlying lung and pleural disease. Heart size relatively unchanged.  Unchanged position of right IJ central line which appears to terminate superior vena cava. Surgical drain overlies the thoracic inlet. Surgical changes of prior median sternotomy. Overlying EKG leads.  IMPRESSION: Relatively unchanged appearance of the chest x-ray with dense basilar opacities likely reflecting combination of pleural fluid, atelectasis, and/or consolidated changes of the lungs.  Unchanged support apparatus.  Signed,  Dulcy Fanny. Earleen Newport, DO  [facility]  [facility]   Electronically Signed   By: Corrie Mckusick O.D.   On: 04/22/2014 08:10   Dg Chest Port 1 View 04/21/2014   CLINICAL DATA:  Esophagogastrectomy and esophagogastrostomy. Postoperative atelectasis and pleural effusions.  EXAM: PORTABLE CHEST - 1 VIEW  COMPARISON:  04/20/2014  FINDINGS: Endotracheal tube tip is 2.3 cm above the carina. Right IJ line tip: Cavoatrial junction.  Low lung volumes are present, causing crowding of the pulmonary vasculature. Stable obscuration of both hemidiaphragms noted likely due to a combination of moderate bilateral pleural effusions and associated passive atelectasis. No overt edema in the aerated portions of the lungs.  Calcified granuloma observed peripherally in the right mid lung. Thoracic spondylosis noted.  IMPRESSION: 1. Stable appearance of the chest with bilateral pleural effusions and associated passive atelectasis.   Electronically Signed   By: Sherryl Barters M.D.   On: 04/21/2014 08:13     Current Medications:  . antiseptic oral rinse  7 mL Mouth Rinse QID  . chlorhexidine  15 mL Mouth Rinse BID  . Chlorhexidine Gluconate Cloth  6 each Topical Daily  . enoxaparin (LOVENOX) injection  30 mg Subcutaneous Q24H  . furosemide  40 mg Intravenous Once  . insulin aspart  0-15 Units Subcutaneous 4 times per day  . metoprolol  5 mg Intravenous 4 times per day  . mupirocin ointment  1 application Nasal BID  . nitroGLYCERIN  0.5 inch Topical 4 times per day  . pantoprazole (PROTONIX) IV  40 mg Intravenous Q12H  . sodium chloride  10 mL Intravenous Q12H   . amiodarone 30 mg/hr (04/22/14 0700)  . feeding supplement (VITAL AF 1.2 CAL) 1,000 mL (04/22/14 0700)  . lactated ringers 10 mL/hr (05/03/2014 1902)    ASSESSMENT AND PLAN: Principal Problem:   Esophageal cancer - per TCTS  Active Problems:   Atrial fibrillation with rapid ventricular response - has only had a few beats (maybe) overnight, otherwise SR, no pauses  or bradycardia.    Volume overload - Lasix 40 mg IV x 1 ordered by Dr Servando Snare, further dosing per MD, follow electrolytes/RF. I/O not strongly negative yest but weight trending down. Still net > 8 L since admit.    DIABETES MELLITUS, TYPE II - per TCTS   Signed, Rosaria Ferries , PA-C  8:53 AM 04/22/2014  I have seen and examined the patient along with Rosaria Ferries , PA-C.  I have reviewed the chart, notes and new data.  I agree with PA's note.  Key new complaints: feels a little better, more alert Key examination changes: no further sustained AF, still tachycardic (sinus), very quiet bowels Key new findings / data: very little net diuresis, still markedly hypervolemic, excellent BP  PLAN: Keep on IV amiodarone until reliable enteral feeding for at least 24-48h Will need full anticoagulation once we can give enteral meds Increase diuretics  Sanda Klein, MD, [facility] [phone] 04/22/2014, 10:29 AM

## 2014-04-22 NOTE — Progress Notes (Signed)
Patient ID: Charles [name], male   DOB: March 12, 1935, 79 y.o.   MRN: [id number] [facility] DAILY ICU PROGRESS NOTE                   [address]            [place] [address]          [phone]   4 Days Post-Op Procedure(s) (LRB): LAPAROSCOPIC PROXIMAL ESOPHAGOGASTRECTOMY/DISTAL with EEA  (N/A) LAPAROSCOPIC LYSIS OF ADHESIONS (N/A) JEJUNOSTOMY (N/A)  Total Length of Stay:  LOS: 4 days   Subjective: Fatigued, alert and answers questions but remains mildly confused   Objective: Vital signs in last 24 hours: Temp:  [97.3 F (36.3 C)-98.5 F (36.9 C)] 97.5 F (36.4 C) (09/15 0749) Pulse Rate:  [83-126] 94 (09/15 0700) Cardiac Rhythm:  [-] Sinus tachycardia;Bundle branch block (09/15 0500) Resp:  [20-36] 26 (09/15 0700) BP: (124-183)/(55-76) 140/63 mmHg (09/15 0700) SpO2:  [89 %-100 %] 98 % (09/15 0700) FiO2 (%):  [35 %-100 %] 100 % (09/14 1600) Weight:  [192 lb 11.2 oz (87.408 kg)-198 lb 13.7 oz (90.2 kg)] 192 lb 11.2 oz (87.408 kg) (09/15 0500)  Filed Weights   04/20/2014 0600 04/21/14 1600 04/22/14 0500  Weight: 179 lb (81.194 kg) 198 lb 13.7 oz (90.2 kg) 192 lb 11.2 oz (87.408 kg)    Weight change:    Hemodynamic parameters for last 24 hours:    Intake/Output from previous day: 09/14 0701 - 09/15 0700 In: 1699.2 [I.V.:1499.2; NG/GT:110] Out: 2031 [Urine:1781; Emesis/NG output:250]  Intake/Output this shift:    Current Meds: Scheduled Meds: . antiseptic oral rinse  7 mL Mouth Rinse QID  . chlorhexidine  15 mL Mouth Rinse BID  . Chlorhexidine Gluconate Cloth  6 each Topical Daily  . enoxaparin (LOVENOX) injection  30 mg Subcutaneous Q24H  . insulin aspart  0-15 Units Subcutaneous 4 times per day  . metoprolol  5 mg Intravenous 4 times per day  . mupirocin ointment  1 application Nasal BID  . nitroGLYCERIN  0.5 inch Topical 4 times per day  . pantoprazole (PROTONIX) IV  40 mg Intravenous Q12H  . sodium chloride  10 mL Intravenous Q12H   Continuous  Infusions: . amiodarone 30 mg/hr (04/22/14 0700)  . feeding supplement (VITAL AF 1.2 CAL) 1,000 mL (04/22/14 0700)  . lactated ringers 10 mL/hr (04/17/2014 1902)   PRN Meds:.fentaNYL, Influenza vac split quadrivalent PF, metoprolol, ondansetron (ZOFRAN) IV, pneumococcal 23 valent vaccine, potassium chloride  General appearance: alert, cooperative, fatigued and no distress Neurologic: intact Heart: regular rate and rhythm, S1, S2 normal, no murmur, click, rub or gallop Lungs: diminished breath sounds bibasilar Abdomen: hypoactive bowel sounds midly distended , tube feedings trickling in Extremities: extremities normal, atraumatic, no cyanosis or edema and Homans sign is negative, no sign of DVT Wound: intact  Lab Results: CBC: Recent Labs  04/21/14 0339 04/22/14 0440  WBC 12.6* 10.6*  HGB 13.3 13.2  HCT 40.0 38.5*  PLT 136* 130*   BMET:  Recent Labs  04/21/14 0339 04/22/14 0440  NA 130* 131*  K 4.1 4.1  CL 96 95*  CO2 23 23  GLUCOSE 134* 123*  BUN 30* 36*  CREATININE 1.13 1.24  CALCIUM 8.5 8.9    PT/INR: No results found for this basename: LABPROT, INR,  in the last 72 hours Radiology: Dg Chest Port 1 View  04/21/2014   CLINICAL DATA:  Esophagogastrectomy and esophagogastrostomy. Postoperative atelectasis and pleural effusions.  EXAM: PORTABLE  CHEST - 1 VIEW  COMPARISON:  04/20/2014  FINDINGS: Endotracheal tube tip is 2.3 cm above the carina. Right IJ line tip: Cavoatrial junction.  Low lung volumes are present, causing crowding of the pulmonary vasculature. Stable obscuration of both hemidiaphragms noted likely due to a combination of moderate bilateral pleural effusions and associated passive atelectasis. No overt edema in the aerated portions of the lungs.  Calcified granuloma observed peripherally in the right mid lung. Thoracic spondylosis noted.  IMPRESSION: 1. Stable appearance of the chest with bilateral pleural effusions and associated passive atelectasis.    Electronically Signed   By: Sherryl Barters M.D.   On: 04/21/2014 08:13   Dg Chest Port 1 View  04/20/2014   CLINICAL DATA:  Postoperative evaluation. Evaluate chest tube placement.  EXAM: PORTABLE CHEST - 1 VIEW  COMPARISON:  Chest x-ray 04/19/2014.  FINDINGS: Nasogastric tube in position with tip terminating in the mid to upper esophagus and side port at the level of the thoracic inlet. Right internal jugular central venous catheter with tip terminating in the distal superior vena cava. Status post median sternotomy for CABG with LIMA. Implantable electronic device overlying the lower left hemithorax is presumably an implantable loop recorder. Small calcified granuloma in the right mid lung with multiple calcified right hilar lymph nodes. Lung volumes are low. Bibasilar opacities are favored to reflect postoperative areas of subsegmental atelectasis with associated moderate right and small left pleural effusions. Heart size is normal. Upper mediastinal contours are within normal limits. Orthopedic fixation hardware in the lower cervical spine.  IMPRESSION: 1. Support apparatus, as above.  No chest tube noted. 2. Status post esophagogastrectomy and esophagogastrostomy with bibasilar postoperative atelectasis, moderate right and small left pleural effusions.   Electronically Signed   By: Vinnie Langton M.D.   On: 04/20/2014 09:52     Assessment/Plan: S/P Procedure(s) (LRB): LAPAROSCOPIC PROXIMAL ESOPHAGOGASTRECTOMY/DISTAL with EEA  (N/A) LAPAROSCOPIC LYSIS OF ADHESIONS (N/A) JEJUNOSTOMY (N/A) Wbc decreased Renal /cr stable  hyponatremia slightly improved Holding sinus     Doshie Maggi B 04/22/2014 7:54 AM

## 2014-04-23 ENCOUNTER — Inpatient Hospital Stay (HOSPITAL_COMMUNITY): Payer: Medicare Other

## 2014-04-23 DIAGNOSIS — I441 Atrioventricular block, second degree: Secondary | ICD-10-CM

## 2014-04-23 LAB — CBC
HCT: 35.9 % — ABNORMAL LOW (ref 39.0–52.0)
Hemoglobin: 12.4 g/dL — ABNORMAL LOW (ref 13.0–17.0)
MCH: 31.3 pg (ref 26.0–34.0)
MCHC: 34.5 g/dL (ref 30.0–36.0)
MCV: 90.7 fL (ref 78.0–100.0)
Platelets: 132 10*3/uL — ABNORMAL LOW (ref 150–400)
RBC: 3.96 MIL/uL — ABNORMAL LOW (ref 4.22–5.81)
RDW: 13.6 % (ref 11.5–15.5)
WBC: 10.3 10*3/uL (ref 4.0–10.5)

## 2014-04-23 LAB — BASIC METABOLIC PANEL
ANION GAP: 15 (ref 5–15)
BUN: 47 mg/dL — ABNORMAL HIGH (ref 6–23)
CHLORIDE: 98 meq/L (ref 96–112)
CO2: 26 meq/L (ref 19–32)
Calcium: 8.9 mg/dL (ref 8.4–10.5)
Creatinine, Ser: 1.42 mg/dL — ABNORMAL HIGH (ref 0.50–1.35)
GFR calc Af Amer: 53 mL/min — ABNORMAL LOW (ref >90)
GFR calc non Af Amer: 46 mL/min — ABNORMAL LOW (ref >90)
Glucose, Bld: 137 mg/dL — ABNORMAL HIGH (ref 70–99)
Potassium: 3.6 mEq/L — ABNORMAL LOW (ref 3.7–5.3)
SODIUM: 139 meq/L (ref 137–147)

## 2014-04-23 LAB — GLUCOSE, CAPILLARY
GLUCOSE-CAPILLARY: 130 mg/dL — AB (ref 70–99)
GLUCOSE-CAPILLARY: 144 mg/dL — AB (ref 70–99)
Glucose-Capillary: 143 mg/dL — ABNORMAL HIGH (ref 70–99)
Glucose-Capillary: 117 mg/dL — ABNORMAL HIGH (ref 70–99)

## 2014-04-23 MED ORDER — VITAL AF 1.2 CAL PO LIQD
1000.0000 mL | ORAL | Status: DC
  Filled 2014-04-23 (×2): qty 1000

## 2014-04-23 NOTE — Progress Notes (Addendum)
      [address]       [place] [address]             [phone]      5 Days Post-Op Procedure(s) (LRB): LAPAROSCOPIC PROXIMAL ESOPHAGOGASTRECTOMY/DISTAL with EEA  (N/A) LAPAROSCOPIC LYSIS OF ADHESIONS (N/A) JEJUNOSTOMY (N/A)  Subjective:  Patient confused and agitated this morning.  Continually trying to get out of bed, tugging on lines and oxygen mask.  Objective: Vital signs in last 24 hours: Temp:  [97.6 F (36.4 C)-98.9 F (37.2 C)] 97.6 F (36.4 C) (09/16 0812) Pulse Rate:  [91-120] 95 (09/16 0800) Cardiac Rhythm:  [-] Sinus tachycardia (09/16 0600) Resp:  [1-38] 21 (09/16 0800) BP: (124-162)/(51-109) 142/60 mmHg (09/16 0800) SpO2:  [90 %-98 %] 96 % (09/16 0800) FiO2 (%):  [55 %] 55 % (09/16 0800) Weight:  [185 lb 13.6 oz (84.3 kg)] 185 lb 13.6 oz (84.3 kg) (09/16 0500)  Intake/Output from previous day: 09/15 0701 - 09/16 0700 In: 930.8 [I.V.:640.8; NG/GT:240; IV Piggyback:50] Out: 2650 [Urine:2250; Emesis/NG output:400] Intake/Output this shift: Total I/O In: 96.7 [I.V.:26.7; NG/GT:20; IV Piggyback:50] Out: -   General appearance: alert, mild distress and confused Heart: regular rate and rhythm Lungs: diminished breath sounds bibasilar Abdomen: hypoactive bowel sounds, mild distention Wound: clean and dry  Lab Results:  Recent Labs  04/22/14 0440 04/23/14 0410  WBC 10.6* 10.3  HGB 13.2 12.4*  HCT 38.5* 35.9*  PLT 130* 132*   BMET:  Recent Labs  04/22/14 0440 04/23/14 0410  NA 131* 139  K 4.1 3.6*  CL 95* 98  CO2 23 26  GLUCOSE 123* 137*  BUN 36* 47*  CREATININE 1.24 1.42*  CALCIUM 8.9 8.9    PT/INR: No results found for this basename: LABPROT, INR,  in the last 72 hours ABG    Component Value Date/Time   PHART 7.392 04/19/2014 0404   HCO3 20.8 04/19/2014 0404   TCO2 22 04/19/2014 0404   ACIDBASEDEF 3.0* 04/19/2014 0404   O2SAT 94.0 04/19/2014 0404   CBG (last 3)   Recent Labs  04/22/14 1748 04/22/14 2349  04/23/14 0605  GLUCAP 121* 130* 144*    Assessment/Plan: S/P Procedure(s) (LRB): LAPAROSCOPIC PROXIMAL ESOPHAGOGASTRECTOMY/DISTAL with EEA  (N/A) LAPAROSCOPIC LYSIS OF ADHESIONS (N/A) JEJUNOSTOMY (N/A)  1. Neuro- patient confused and agitated this morning, continue restraints for patient safety and monitor 2. Pulm- wean oxygen as tolerated, patient currently on face mask, CXR with small bilateral effusions 3. Renal- creatinine trending upward, lytes okay will follow 4. GI- on tube feeds currently tolerating without difficulty, ? Swallow study possibly 24-48 hours    LOS: 5 days    BARRETT, ERIN 04/23/2014 Hypokalemia, being replaced   Will hold lasix this am, as mildly elevated cr/bun,  Bun/cr ratio elevated suggesting decreased intravascular Considering right thoracentesis Now with bowel sounds will increase tube feeding I have seen and examined [name] and agree with the above assessment  and plan. Consider gastrografin  swallow Friday  Grace Isaac MD Beeper [phone] Office [phone] 04/23/2014 9:08 AM

## 2014-04-23 NOTE — Significant Event (Signed)
Patient ambulated around the whole unit, on 4L [place]. Returned to room and settled in chair. Chair alarm on, waist restraint secured. Will continue to monitor closely. Shirrell Solinger, Therapist, sports.

## 2014-04-23 NOTE — Progress Notes (Signed)
Patient ID: Charles [name], male   DOB: 1935-02-12, 79 y.o.   MRN: [id number] EVENING ROUNDS NOTE :     [address]       [place] [address]             [phone]                 5 Days Post-Op Procedure(s) (LRB): LAPAROSCOPIC PROXIMAL ESOPHAGOGASTRECTOMY/DISTAL with EEA  (N/A) LAPAROSCOPIC LYSIS OF ADHESIONS (N/A) JEJUNOSTOMY (N/A)  Total Length of Stay:  LOS: 5 days  BP 158/56  Pulse 106  Temp(Src) 99.2 F (37.3 C) (Oral)  Resp 27  Ht 5' 10.87" (1.8 m)  Wt 185 lb 13.6 oz (84.3 kg)  BMI 26.02 kg/m2  SpO2 93%  .Intake/Output     09/16 0701 - 09/17 0700   I.V. (mL/kg) 347.1 (4.1)   Other 60   NG/GT 260   IV Piggyback 100   Total Intake(mL/kg) 767.1 (9.1)   Urine (mL/kg/hr) 775 (0.7)   Emesis/NG output 50 (0)   Total Output 825   Net -57.9         . amiodarone 30 mg/hr (04/23/14 1900)  . feeding supplement (VITAL AF 1.2 CAL)    . lactated ringers 10 mL/hr (05/05/2014 1902)     Lab Results  Component Value Date   WBC 10.3 04/23/2014   HGB 12.4* 04/23/2014   HCT 35.9* 04/23/2014   PLT 132* 04/23/2014   GLUCOSE 137* 04/23/2014   CHOL 167 08/07/2013   TRIG 165* 08/07/2013   HDL 52 08/07/2013   LDLDIRECT 151.6 07/25/2011   LDLCALC 82 08/07/2013   ALT 33 04/22/2014   AST 28 04/22/2014   NA 139 04/23/2014   K 3.6* 04/23/2014   CL 98 04/23/2014   CREATININE 1.42* 04/23/2014   BUN 47* 04/23/2014   CO2 26 04/23/2014   TSH 4.150 04/22/2014   INR 1.08 04/10/2014   HGBA1C 6.2* 08/07/2013   MICROALBUR 2.4* 07/25/2011   Better day ,less confused  Tonight Walked around the unit today   Grace Isaac MD  Beeper [phone] Office [phone] 04/23/2014 8:38 PM

## 2014-04-23 NOTE — Progress Notes (Signed)
Patient Profile: 79 yo male w/ hx CABG and DES of SVG-RI & PDA TIA in 2014, loop recorder w/ > 5 sec pause, PPM refused due to dx esoph CA. Admitted after chemo/XRT 09/11 for surgery. Cards seeing for PAF, also w/ some volume overload.    Subjective: Resting and a bit drowsy. No major complaints. Family by bedside.   Objective: Vital signs in last 24 hours: Temp:  [97.6 F (36.4 C)-98.9 F (37.2 C)] 97.6 F (36.4 C) (09/16 0812) Pulse Rate:  [91-120] 102 (09/16 0900) Resp:  [18-38] 25 (09/16 0900) BP: (124-162)/(51-109) 142/60 mmHg (09/16 0800) SpO2:  [90 %-97 %] 97 % (09/16 0900) FiO2 (%):  [55 %] 55 % (09/16 0900) Weight:  [185 lb 13.6 oz (84.3 kg)] 185 lb 13.6 oz (84.3 kg) (09/16 0500)    Intake/Output from previous day: 09/15 0701 - 09/16 0700 In: 930.8 [I.V.:640.8; NG/GT:240; IV Piggyback:50] Out: 2650 [Urine:2250; Emesis/NG output:400] Intake/Output this shift: Total I/O In: 143.4 [I.V.:53.4; NG/GT:40; IV Piggyback:50] Out: 475 [Urine:475]  Medications Current Facility-Administered Medications  Medication Dose Route Frequency Provider Last Rate Last Dose  . amiodarone (NEXTERONE PREMIX) 360 MG/200ML (1.8 mg/mL) IV infusion  30 mg/hr Intravenous Continuous Grace Isaac, MD 16.7 mL/hr at 04/23/14 0900 30 mg/hr at 04/23/14 0900  . antiseptic oral rinse (CPC / CETYLPYRIDINIUM CHLORIDE 0.05%) solution 7 mL  7 mL Mouth Rinse QID Grace Isaac, MD   7 mL at 04/23/14 0400  . chlorhexidine (PERIDEX) 0.12 % solution 15 mL  15 mL Mouth Rinse BID Grace Isaac, MD   15 mL at 04/23/14 0803  . Chlorhexidine Gluconate Cloth 2 % PADS 6 each  6 each Topical Daily Grace Isaac, MD   6 each at 04/22/14 1000  . enoxaparin (LOVENOX) injection 30 mg  30 mg Subcutaneous Q24H Grace Isaac, MD   30 mg at 04/22/14 1216  . feeding supplement (VITAL AF 1.2 CAL) liquid 1,000 mL  1,000 mL Per Tube Continuous Rogue Bussing, RD 10 mL/hr at 04/23/14 0040 1,000 mL at  04/23/14 0040  . fentaNYL (SUBLIMAZE) injection 50 mcg  50 mcg Intravenous Q1H PRN Grace Isaac, MD   50 mcg at 04/22/14 2106  . Influenza vac split quadrivalent PF (FLUARIX) injection 0.5 mL  0.5 mL Intramuscular Prior to discharge Grace Isaac, MD      . insulin aspart (novoLOG) injection 0-15 Units  0-15 Units Subcutaneous 4 times per day Grace Isaac, MD   2 Units at 04/23/14 [phone]  . lactated ringers infusion   Intravenous Continuous Grace Isaac, MD 10 mL/hr at 04/30/2014 1902 10 mL/hr at 04/30/2014 1902  . metoprolol (LOPRESSOR) injection 2.5 mg  2.5 mg Intravenous Q5 min PRN Grace Isaac, MD   2.5 mg at 04/20/14 1536  . metoprolol (LOPRESSOR) injection 5 mg  5 mg Intravenous 4 times per day Autumn Messing III, MD   5 mg at 04/23/14 0557  . mupirocin ointment (BACTROBAN) 2 % 1 application  1 application Nasal BID Grace Isaac, MD   1 application at 96/04/54 2214  . nitroGLYCERIN (NITROGLYN) 2 % ointment 0.5 inch  0.5 inch Topical 4 times per day Grace Isaac, MD   0.5 inch at 04/23/14 0557  . ondansetron (ZOFRAN) injection 4 mg  4 mg Intravenous Q4H PRN Grace Isaac, MD   4 mg at 04/21/14 1848  . pantoprazole (PROTONIX) injection 40 mg  40 mg Intravenous Q12H Grace Isaac,  MD   40 mg at 04/23/14 1016  . pneumococcal 23 valent vaccine (PNU-IMMUNE) injection 0.5 mL  0.5 mL Intramuscular Prior to discharge Grace Isaac, MD      . potassium chloride 10 mEq in 50 mL *CENTRAL LINE* IVPB  10 mEq Intravenous PRN Grace Isaac, MD   10 mEq at 04/23/14 1017  . sodium chloride 0.9 % injection 10 mL  10 mL Intravenous Q12H Grace Isaac, MD   10 mL at 04/23/14 1017    PE: General appearance: alert, cooperative and no distress Lungs: decreased BS at the bases Heart: regular rhythm, tachy rate Extremities: trace LEE Pulses: 2+ and symmetric Skin: warm and dry Neurologic: Grossly normal  Lab Results:   Recent Labs  04/21/14 0339 04/22/14 0440  04/23/14 0410  WBC 12.6* 10.6* 10.3  HGB 13.3 13.2 12.4*  HCT 40.0 38.5* 35.9*  PLT 136* 130* 132*   BMET  Recent Labs  04/21/14 0339 04/22/14 0440 04/23/14 0410  NA 130* 131* 139  K 4.1 4.1 3.6*  CL 96 95* 98  CO2 23 23 26   GLUCOSE 134* 123* 137*  BUN 30* 36* 47*  CREATININE 1.13 1.24 1.42*  CALCIUM 8.5 8.9 8.9     Assessment/Plan  Principal Problem:   Esophageal cancer Active Problems:   DIABETES MELLITUS, TYPE II   Atrial fibrillation with rapid ventricular response   Volume overload  1. Atrial Fibrillation: currently borderline sinus tachy with HR in the upper 90s, low 100s. Continue on IV amiodarone until reliable enteral feeding for at least 24-48h. Will also need full anticoagulation once he can be given enteral meds.  2. Volume overload: I/Os still in the positive at 6.2 L. IV Lasix is on hold per TCTS due to increasing Scr/BUN 1.42/47. Continue to monitor fluid status closely. Restart lasix once renal function improves.      LOS: 5 days    Brittainy M. Rosita Fire, PA-C 04/23/2014 10:44 AM  I have seen and examined the patient along with Brittainy M. Rosita Fire, PA-C.  I have reviewed the chart, notes and new data.  I agree with PA's note.  Key new complaints: he is intermittently disoriented, but denies dyspnea or angina Key examination changes: NSR, less edema, very weak bowel sounds Key new findings / data: 1.8L negative, but still clearly hypervolemic  PLAN: He is probably 3rd spacing in his intestines, hence the rising BUN/creat despite +ve volume status IV amiodarone until enteral feeding established for at least 24-48h. Start NOAC when able to give enteral meds.  Sanda Klein, MD, [place] [phone] 04/23/2014, 1:17 PM

## 2014-04-23 NOTE — Significant Event (Addendum)
Patient have attempted to get out of the chair several times since being in it and pulling off venti-mask off, kept stating he wants to go home. Patient has the chair-alarm on, is re-oriented by staff. Patient remains confused. Dr. Servando Snare at bedside. Received new order for a waist belt restraint to keep patient safe. Will continue to monitor closely. Kavin Weckwerth, Therapist, sports.

## 2014-04-23 NOTE — Progress Notes (Signed)
Patient ID: Charles [name], male   DOB: [birth year], 79 y.o.   MRN: [id number] [facility] Progress Note:   5 Days Post-Op  Subjective: Mental status is fairly clear and coherent Objective: Vital signs in last 24 hours: Temp:  [97.6 F (36.4 C)-98.9 F (37.2 C)] 98 F (36.7 C) (09/16 1537) Pulse Rate:  [84-120] 106 (09/16 1700) Resp:  [17-38] 26 (09/16 1700) BP: (122-162)/(46-109) 146/59 mmHg (09/16 1700) SpO2:  [90 %-97 %] 93 % (09/16 1700) FiO2 (%):  [55 %] 55 % (09/16 1000) Weight:  [185 lb 13.6 oz (84.3 kg)] 185 lb 13.6 oz (84.3 kg) (09/16 0500)  Intake/Output from previous day: 09/15 0701 - 09/16 0700 In: 930.8 [I.V.:640.8; NG/GT:240; IV Piggyback:50] Out: 2650 [Urine:2250; Emesis/NG output:400] Intake/Output this shift: Total I/O In: 567 [I.V.:267; NG/GT:200; IV Piggyback:100] Out: 775 [Urine:775]  Physical Exam: Work of breathing is not labored.  NG looks like it contains tube feed.   Lab Results:  Results for orders placed during the hospital encounter of 05/03/2014 (from the past 48 hour(s))  GLUCOSE, CAPILLARY     Status: Abnormal   Collection Time    04/21/14 11:33 PM      Result Value Ref Range   Glucose-Capillary 134 (*) 70 - 99 mg/dL  CBC     Status: Abnormal   Collection Time    04/22/14  4:40 AM      Result Value Ref Range   WBC 10.6 (*) 4.0 - 10.5 K/uL   RBC 4.15 (*) 4.22 - 5.81 MIL/uL   Hemoglobin 13.2  13.0 - 17.0 g/dL   HCT 38.5 (*) 39.0 - 52.0 %   MCV 92.8  78.0 - 100.0 fL   MCH 31.8  26.0 - 34.0 pg   MCHC 34.3  30.0 - 36.0 g/dL   RDW 13.4  11.5 - 15.5 %   Platelets 130 (*) 150 - 400 K/uL  BASIC METABOLIC PANEL     Status: Abnormal   Collection Time    04/22/14  4:40 AM      Result Value Ref Range   Sodium 131 (*) 137 - 147 mEq/L   Potassium 4.1  3.7 - 5.3 mEq/L   Chloride 95 (*) 96 - 112 mEq/L   CO2 23  19 - 32 mEq/L   Glucose, Bld 123 (*) 70 - 99 mg/dL   BUN 36 (*) 6 - 23 mg/dL   Creatinine, Ser 1.24  0.50 - 1.35 mg/dL    Calcium 8.9  8.4 - 10.5 mg/dL   GFR calc non Af Amer 54 (*) >90 mL/min   GFR calc Af Amer 63 (*) >90 mL/min   Comment: (NOTE)     The eGFR has been calculated using the CKD EPI equation.     This calculation has not been validated in all clinical situations.     eGFR's persistently <90 mL/min signify possible Chronic Kidney     Disease.   Anion gap 13  5 - 15  GLUCOSE, CAPILLARY     Status: Abnormal   Collection Time    04/22/14  5:52 AM      Result Value Ref Range   Glucose-Capillary 106 (*) 70 - 99 mg/dL   Comment 1 Capillary Sample    TSH     Status: None   Collection Time    04/22/14 10:00 AM      Result Value Ref Range   TSH 4.150  0.350 - 4.500 uIU/mL  HEPATIC FUNCTION PANEL  Status: Abnormal   Collection Time    04/22/14 10:00 AM      Result Value Ref Range   Total Protein 5.6 (*) 6.0 - 8.3 g/dL   Albumin 1.9 (*) 3.5 - 5.2 g/dL   AST 28  0 - 37 U/L   Comment: HEMOLYSIS AT THIS LEVEL MAY AFFECT RESULT   ALT 33  0 - 53 U/L   Alkaline Phosphatase 60  39 - 117 U/L   Total Bilirubin 0.8  0.3 - 1.2 mg/dL   Bilirubin, Direct 0.2  0.0 - 0.3 mg/dL   Comment: HEMOLYSIS AT THIS LEVEL MAY AFFECT RESULT   Indirect Bilirubin 0.6  0.3 - 0.9 mg/dL  GLUCOSE, CAPILLARY     Status: Abnormal   Collection Time    04/22/14 12:08 PM      Result Value Ref Range   Glucose-Capillary 143 (*) 70 - 99 mg/dL   Comment 1 Capillary Sample    GLUCOSE, CAPILLARY     Status: Abnormal   Collection Time    04/22/14  5:48 PM      Result Value Ref Range   Glucose-Capillary 121 (*) 70 - 99 mg/dL   Comment 1 Capillary Sample    GLUCOSE, CAPILLARY     Status: Abnormal   Collection Time    04/22/14 11:49 PM      Result Value Ref Range   Glucose-Capillary 130 (*) 70 - 99 mg/dL   Comment 1 Documented in Chart     Comment 2 Notify RN    BASIC METABOLIC PANEL     Status: Abnormal   Collection Time    04/23/14  4:10 AM      Result Value Ref Range   Sodium 139  137 - 147 mEq/L   Comment: DELTA  CHECK NOTED   Potassium 3.6 (*) 3.7 - 5.3 mEq/L   Chloride 98  96 - 112 mEq/L   CO2 26  19 - 32 mEq/L   Glucose, Bld 137 (*) 70 - 99 mg/dL   BUN 47 (*) 6 - 23 mg/dL   Creatinine, Ser 1.42 (*) 0.50 - 1.35 mg/dL   Calcium 8.9  8.4 - 10.5 mg/dL   GFR calc non Af Amer 46 (*) >90 mL/min   GFR calc Af Amer 53 (*) >90 mL/min   Comment: (NOTE)     The eGFR has been calculated using the CKD EPI equation.     This calculation has not been validated in all clinical situations.     eGFR's persistently <90 mL/min signify possible Chronic Kidney     Disease.   Anion gap 15  5 - 15  CBC     Status: Abnormal   Collection Time    04/23/14  4:10 AM      Result Value Ref Range   WBC 10.3  4.0 - 10.5 K/uL   RBC 3.96 (*) 4.22 - 5.81 MIL/uL   Hemoglobin 12.4 (*) 13.0 - 17.0 g/dL   HCT 35.9 (*) 39.0 - 52.0 %   MCV 90.7  78.0 - 100.0 fL   MCH 31.3  26.0 - 34.0 pg   MCHC 34.5  30.0 - 36.0 g/dL   RDW 13.6  11.5 - 15.5 %   Platelets 132 (*) 150 - 400 K/uL  GLUCOSE, CAPILLARY     Status: Abnormal   Collection Time    04/23/14  6:05 AM      Result Value Ref Range   Glucose-Capillary 144 (*) 70 - 99 mg/dL  Comment 1 Documented in Chart     Comment 2 Notify RN    GLUCOSE, CAPILLARY     Status: Abnormal   Collection Time    04/23/14 12:15 PM      Result Value Ref Range   Glucose-Capillary 143 (*) 70 - 99 mg/dL   Comment 1 Notify RN    GLUCOSE, CAPILLARY     Status: Abnormal   Collection Time    04/23/14  6:11 PM      Result Value Ref Range   Glucose-Capillary 117 (*) 70 - 99 mg/dL   Comment 1 Notify RN      Radiology/Results: Dg Chest Port 1 View  04/23/2014   CLINICAL DATA:  Post operative exam; assess support tubes and lines  EXAM: PORTABLE CHEST - 1 VIEW  COMPARISON:  Portable chest x-ray of April 22, 2014  FINDINGS: The lung volumes remain low. There are bilateral pleural effusions and bibasilar atelectasis. The cardiopericardial silhouette is enlarged. The pulmonary vascularity is not  clearly engorged. The patient has undergone previous CABG. The endotracheal tube tip lies approximately 3 cm above the crotch of the carina. The internal jugular venous catheter tip projects over the proximal SVC.  IMPRESSION: There is persistent hypoinflation with small bilateral pleural effusions and bibasilar atelectasis. There has not been significant interval change since yesterday's study.   Electronically Signed   By: David  Martinique   On: 04/23/2014 08:25   Dg Chest Port 1 View  04/22/2014   CLINICAL DATA:  79 year old male, status post laparoscopic esophagogastrectomy  EXAM: PORTABLE CHEST - 1 VIEW  COMPARISON:  Multiple prior chest x-ray, most recent 04/21/2014 appearing and prior PET-CT 02/13/2014 and 11/08/2013.  FINDINGS: Aeration of the lungs is similar to the comparison with dense basilar opacities obscuring the heart borders and hemidiaphragms. No visualized pneumothorax.  Cardiomediastinal silhouette partially obscured by overlying lung and pleural disease. Heart size relatively unchanged.  Unchanged position of right IJ central line which appears to terminate superior vena cava. Surgical drain overlies the thoracic inlet. Surgical changes of prior median sternotomy. Overlying EKG leads.  IMPRESSION: Relatively unchanged appearance of the chest x-ray with dense basilar opacities likely reflecting combination of pleural fluid, atelectasis, and/or consolidated changes of the lungs.  Unchanged support apparatus.  Signed,  Dulcy Fanny. Earleen Newport, DO  [facility]  [facility]   Electronically Signed   By: Corrie Mckusick O.D.   On: 04/22/2014 08:10    Anti-infectives: Anti-infectives   Start     Dose/Rate Route Frequency Ordered Stop   05/04/2014 1800  cefOXitin (MEFOXIN) 2 g in dextrose 5 % 50 mL IVPB     2 g 100 mL/hr over 30 Minutes Intravenous 4 times per day 04/28/2014 1601 04/19/14 1237   04/17/14 1407  cefUROXime (ZINACEF) 1.5 g in dextrose 5 % 50 mL  IVPB     1.5 g 100 mL/hr over 30 Minutes Intravenous 60 min pre-op 04/17/14 1407 04/10/2014 0839      Assessment/Plan: Problem List: Patient Active Problem List   Diagnosis Date Noted  . Atrial fibrillation with rapid ventricular response 04/21/2014  . Volume overload 04/21/2014  . Esophageal cancer 04/17/2014  . Pre-operative clearance 03/17/2014  . Contact dermatitis 03/05/2014  . Gastroesophageal cancer 10/28/2013  . Second degree AV block 10/25/2013  . CVA (cerebral infarction) 08/06/2013  . TIA (transient ischemic attack) 08/06/2013  . H/O: CVA (cerebrovascular accident) 08/06/2013  . Unspecified constipation 06/14/2013  . S/P SVG-RI/OM DES 05/06/13- staged DES  to RCA 06/07/13 06/08/2013  . Chronic renal insufficiency, stage III (moderate) 06/08/2013  . RBBB 06/08/2013  . Angina, class III 06/07/2013  . Anxiety and depression 05/14/2013  . CAD- S/P CABG x 5 (LIMA-LAD, SVG-RI-OM1, SVG to distal RCA, SVG-PDA) 01/21/13 01/23/2013  . Left main coronary artery disease 01/16/2013  . Abnormal nuclear cardiac imaging test - Inferior Ischemia 01/07/2013  . Yellow jacket sting 04/20/2012  . Annual physical exam 02/17/2012  . Fatigue 01/13/2012  . Onychomycosis 11/29/2010  . GLAUCOMA 12/16/2009  . DIABETES MELLITUS, TYPE II 06/07/2007  . INSOMNIA-SLEEP DISORDER-UNSPEC 05/07/2007  . HYPERLIPIDEMIA 03/01/2007  . HYPERTENSION 03/01/2007    Improving slowly.   5 Days Post-Op    LOS: 5 days   Matt B. Hassell Done, MD, [facility], [place]. [phone] beeper [phone]  04/23/2014 6:34 PM

## 2014-04-24 ENCOUNTER — Inpatient Hospital Stay (HOSPITAL_COMMUNITY): Payer: Medicare Other

## 2014-04-24 DIAGNOSIS — C160 Malignant neoplasm of cardia: Secondary | ICD-10-CM

## 2014-04-24 LAB — BASIC METABOLIC PANEL
ANION GAP: 13 (ref 5–15)
BUN: 48 mg/dL — ABNORMAL HIGH (ref 6–23)
CO2: 27 meq/L (ref 19–32)
CREATININE: 1.23 mg/dL (ref 0.50–1.35)
Calcium: 8.8 mg/dL (ref 8.4–10.5)
Chloride: 100 mEq/L (ref 96–112)
GFR calc Af Amer: 64 mL/min — ABNORMAL LOW (ref >90)
GFR calc non Af Amer: 55 mL/min — ABNORMAL LOW (ref >90)
Glucose, Bld: 131 mg/dL — ABNORMAL HIGH (ref 70–99)
POTASSIUM: 3.4 meq/L — AB (ref 3.7–5.3)
Sodium: 140 mEq/L (ref 137–147)

## 2014-04-24 LAB — BLOOD GAS, ARTERIAL
ACID-BASE EXCESS: 2.3 mmol/L — AB (ref 0.0–2.0)
Acid-Base Excess: 1.5 mmol/L (ref 0.0–2.0)
Bicarbonate: 25.7 mEq/L — ABNORMAL HIGH (ref 20.0–24.0)
Bicarbonate: 25.5 mEq/L — ABNORMAL HIGH (ref 20.0–24.0)
DRAWN BY: 41881
FIO2: 100 %
FIO2: 1 %
LHR: 14 {breaths}/min
O2 Saturation: 92.8 %
O2 Saturation: 91.6 %
PATIENT TEMPERATURE: 98.6
PATIENT TEMPERATURE: 98.6
PCO2 ART: 39.4 mmHg (ref 35.0–45.0)
PEEP: 5 cmH2O
PH ART: 7.426 (ref 7.350–7.450)
TCO2: 26.8 mmol/L (ref 0–100)
TCO2: 26.7 mmol/L (ref 0–100)
VT: 600 mL
pCO2 arterial: 35.4 mmHg (ref 35.0–45.0)
pH, Arterial: 7.474 — ABNORMAL HIGH (ref 7.350–7.450)
pO2, Arterial: 65.5 mmHg — ABNORMAL LOW (ref 80.0–100.0)
pO2, Arterial: 65.3 mmHg — ABNORMAL LOW (ref 80.0–100.0)

## 2014-04-24 LAB — CBC
HCT: 35 % — ABNORMAL LOW (ref 39.0–52.0)
Hemoglobin: 12.2 g/dL — ABNORMAL LOW (ref 13.0–17.0)
MCH: 31.4 pg (ref 26.0–34.0)
MCHC: 34.9 g/dL (ref 30.0–36.0)
MCV: 90.2 fL (ref 78.0–100.0)
Platelets: 138 10*3/uL — ABNORMAL LOW (ref 150–400)
RBC: 3.88 MIL/uL — ABNORMAL LOW (ref 4.22–5.81)
RDW: 14.2 % (ref 11.5–15.5)
WBC: 13.2 10*3/uL — ABNORMAL HIGH (ref 4.0–10.5)

## 2014-04-24 LAB — GLUCOSE, CAPILLARY
GLUCOSE-CAPILLARY: 147 mg/dL — AB (ref 70–99)
GLUCOSE-CAPILLARY: 130 mg/dL — AB (ref 70–99)
Glucose-Capillary: 136 mg/dL — ABNORMAL HIGH (ref 70–99)

## 2014-04-24 MED ORDER — ETOMIDATE 2 MG/ML IV SOLN
20.0000 mg | Freq: Once | INTRAVENOUS | Status: AC
  Administered 2014-04-24: 20 mg via INTRAVENOUS

## 2014-04-24 MED ORDER — FENTANYL CITRATE 0.05 MG/ML IJ SOLN
50.0000 ug | Freq: Once | INTRAMUSCULAR | Status: AC
Start: 2014-04-24 — End: 2014-04-24
  Administered 2014-04-24: 50 ug via INTRAVENOUS

## 2014-04-24 MED ORDER — LEVALBUTEROL HCL 0.63 MG/3ML IN NEBU: 0.6300 mg | INHALATION_SOLUTION | RESPIRATORY_TRACT | Status: DC | PRN

## 2014-04-24 MED ORDER — METOPROLOL TARTRATE 25 MG/10 ML ORAL SUSPENSION
25.0000 mg | Freq: Two times a day (BID) | ORAL | Status: DC
  Administered 2014-04-24 (×2): 25 mg via JEJUNOSTOMY
  Filled 2014-04-24 (×4): qty 10

## 2014-04-24 MED ORDER — POTASSIUM CHLORIDE 10 MEQ/50ML IV SOLN
10.0000 meq | INTRAVENOUS | Status: DC
  Filled 2014-04-24: qty 50

## 2014-04-24 MED ORDER — SODIUM CHLORIDE 0.9 % IV SOLN
0.0000 ug/h | INTRAVENOUS | Status: DC
  Administered 2014-04-24: 50 ug/h via INTRAVENOUS
  Filled 2014-04-24: qty 50

## 2014-04-24 MED ORDER — MIDAZOLAM HCL 2 MG/2ML IJ SOLN
INTRAMUSCULAR | Status: AC
  Administered 2014-04-24: 2 mg
  Filled 2014-04-24: qty 2

## 2014-04-24 MED ORDER — FENTANYL BOLUS VIA INFUSION
25.0000 ug | INTRAVENOUS | Status: DC | PRN
  Filled 2014-04-24: qty 50

## 2014-04-24 MED ORDER — ROCURONIUM BROMIDE 50 MG/5ML IV SOLN
50.0000 mg | Freq: Once | INTRAVENOUS | Status: AC
  Administered 2014-04-24: 50 mg via INTRAVENOUS

## 2014-04-24 MED ORDER — VITAL AF 1.2 CAL PO LIQD
1000.0000 mL | ORAL | Status: DC
  Filled 2014-04-24: qty 1000

## 2014-04-24 MED ORDER — VANCOMYCIN HCL IN DEXTROSE 750-5 MG/150ML-% IV SOLN
750.0000 mg | Freq: Two times a day (BID) | INTRAVENOUS | Status: DC
  Administered 2014-04-25: 750 mg via INTRAVENOUS
  Filled 2014-04-24 (×2): qty 150

## 2014-04-24 MED ORDER — INSULIN ASPART 100 UNIT/ML ~~LOC~~ SOLN
2.0000 [IU] | SUBCUTANEOUS | Status: DC
  Administered 2014-04-25: 4 [IU] via SUBCUTANEOUS

## 2014-04-24 MED ORDER — PIPERACILLIN-TAZOBACTAM 3.375 G IVPB
3.3750 g | Freq: Three times a day (TID) | INTRAVENOUS | Status: DC
  Administered 2014-04-25: 3.375 g via INTRAVENOUS
  Filled 2014-04-24 (×3): qty 50

## 2014-04-24 NOTE — Progress Notes (Signed)
ANTIBIOTIC CONSULT NOTE - INITIAL  Pharmacy Consult for Vancomycin/Zosyn  Indication: rule out pneumonia  Allergies  Allergen Reactions  . Percocet [Oxycodone-Acetaminophen] Other (See Comments)    hallucinations  . Atorvastatin Other (See Comments)    Causes myalgias   . Ezetimibe Other (See Comments)    Causes dizziness  . Lovastatin Other (See Comments)    REACTION: weakness. Patient able to tolerate pravastatin  . Ranexa [Ranolazine] Other (See Comments)    Severe constipation  . Tramadol     Nausea and Vomiting    Patient Measurements: Height: 5' 10.87" (180 cm) Weight: 184 lb 4.9 oz (83.6 kg) IBW/kg (Calculated) : 74.99  Vital Signs: Temp: 99.6 F (37.6 C) (09/17 1939) Temp src: Oral (09/17 1939) BP: 147/116 mmHg (09/17 1800) Pulse Rate: 102 (09/17 1800)  Labs:  Recent Labs  04/22/14 0440 04/23/14 0410 04/24/14 0410  WBC 10.6* 10.3 13.2*  HGB 13.2 12.4* 12.2*  PLT 130* 132* 138*  CREATININE 1.24 1.42* 1.23   Estimated Creatinine Clearance: 53.4 ml/min (by C-G formula based on Cr of 1.23).   Microbiology: Recent Results (from the past 720 hour(s))  SURGICAL PCR SCREEN     Status: Abnormal   Collection Time    04/10/14 10:00 AM      Result Value Ref Range Status   MRSA, PCR NEGATIVE  NEGATIVE Final   Staphylococcus aureus POSITIVE (*) NEGATIVE Final   Comment:            The Xpert SA Assay (FDA     approved for NASAL specimens     in patients over 18 years of age),     is one component of     a comprehensive surveillance     program.  Test performance has     been validated by [employer] for patients greater     than or equal to 25 year old.     It is not intended     to diagnose infection nor to     guide or monitor treatment.    Medical History: Past Medical History  Diagnosis Date  . Hypertension   . Hyperlipidemia     diet controlled, statins and zetia intolerant  . Retinopathy     mild glaucomea  . Transient ischemic attack  1999  . Histoplasmosis   . Chronic cough 08/2009    -allergy profile- june 2,2011- neg. -Max GERD rx/ off oils January 07 2010 x 3 weeks only but no benefit. - Sinus CT rec July 11,2011- refused due to claustrophobia -MCT neg for asthma 03/01/10 -Add 1st Gen H1 July 11,2011- better  . Macular degeneration   . Coronary artery disease     CABG 01-2013, stent 04-2013    . Heart murmur     TIA  . Adenocarcinoma of gastroesophageal junction   . Stroke 1995    tia 2 years ago  . Radiation 11/19/13-12/26/13    gastroesophageal junction area 50.4 gray  . Diabetes mellitus     no meds- controls with diet  . PONV (postoperative nausea and vomiting)     extreme nausea  . Dysrhythmia     bradyarrhythmias, 2nd degree AVB with pauses 10/2013 (Dr. Sallyanne Kuster)    Assessment: 79 y/o M s/p esophagogastrectomy, intubated this evening for possible PNA, WBC mildly elevated, CrCl ~50, other labs as above.   Goal of Therapy:  Vancomycin trough level 15-20 mcg/ml  Plan:  -Vancomycin 750 mg IV q12h -Zosyn 3.375G IV  q8h to be infused over 4 hours -Trend WBC, temp, renal function  -Drug levels as indicated  Narda Bonds 04/24/2014,11:55 PM

## 2014-04-24 NOTE — Consult Note (Signed)
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 Presented for esophagogastrostomy, and feeding jejunostomy 9/11. The procedure went as planned. He remained in ICU due to increased O2 demands and delerium. 9/17 PM he developed profound SOB and hypoxia requiring intubation. PCCM to assist with ICU care.    STUDIES:    SIGNIFICANT EVENTS: 9/11 - to OR for esophagogastrostomy 9/11. Out to ICU 9/17 - Intubated for hypoxia, likely asp pna   HISTORY OF PRESENT ILLNESS: 79 yo male who presented with an episode of dark stools approximately 3 months ago. Evaluation showed a mass at the GE junction extending to the cardia with PET scan and CT scan suggestive of the local lymph node involvement. The patient underwent a course of chemotherapy and radiation therapy. Completing this, he had significant response and was referred for consideration of surgical resection. He presented for surgery on 9/11 for Laparoscopic approach with enterolysis, esophagogastrectomy with 21-mm EEA, esophagogastrostomy, upper GI endoscopy, and feeding jejunostomy. The procedure was without complication. He was placed in ICU post operatively. Course has been complicated by atrial fib, hypoxia, and delirium. 9/17 He developed acute hypoxia, tachypnea, and dyspnea. PCCM as asked to see.    PAST MEDICAL HISTORY :  Past Medical History  Diagnosis Date  . Hypertension   . Hyperlipidemia     diet controlled, statins and zetia intolerant  . Retinopathy     mild glaucomea  . Transient ischemic attack 1999  . Histoplasmosis   . Chronic cough 08/2009    -allergy profile- june 2,2011- neg. -Max GERD rx/ off oils January 07 2010 x 3 weeks only but no benefit. - Sinus CT rec July 11,2011- refused due to  claustrophobia -MCT neg for asthma 03/01/10 -Add 1st Gen H1 July 11,2011- better  . Macular degeneration   . Coronary artery disease     CABG 01-2013, stent 04-2013    . Heart murmur     TIA  . Adenocarcinoma of gastroesophageal junction   . Stroke 1995    tia 2 years ago  . Radiation 11/19/13-12/26/13    gastroesophageal junction area 50.4 gray  . Diabetes mellitus     no meds- controls with diet  . PONV (postoperative nausea and vomiting)     extreme nausea  . Dysrhythmia     bradyarrhythmias, 2nd degree AVB with pauses 10/2013 (Dr. Sallyanne Kuster)   Past Surgical History  Procedure Laterality Date  . Cervical spine surgery  2002    plate  . Cholecystectomy  1985  . Percutaneous coronary stent intervention (pci-s)  101/31/2014    Dr Ellyn Hack, Promus Premier DES 2.25 mm x 24 mm to the PDA  . Coronary artery bypass graft N/A 01/21/2013    Procedure: Coronary Arery Bypass Grafting  Times Five Using Left Internal Mammary Artery and Right Saphenous Leg Vein Harvested Endoscopically;  Surgeon: Melrose Nakayama, MD; LIMA-LAD, SVG-RI-OM1, SVG-RCA-PDA  . Tee without cardioversion N/A 08/23/2013    Procedure: TRANSESOPHAGEAL ECHOCARDIOGRAM (TEE);  Surgeon: Sanda Klein, MD;  Location: [facility] ENDOSCOPY;  Service: Cardiovascular;  Laterality: N/A;  . Esophagogastroduodenoscopy N/A 10/17/2013    Procedure: ESOPHAGOGASTRODUODENOSCOPY (EGD);  Surgeon: Winfield Cunas., MD;  Location: [facility];  Service: Endoscopy;  Laterality: N/A;  . Appendectomy    . Esophagogastroduodenoscopy N/A 02/05/2014  Procedure: ESOPHAGOGASTRODUODENOSCOPY (EGD);  Surgeon: Winfield Cunas., MD;  Location: [facility] ENDOS[facility];  Service: Endoscopy;  Laterality: N/A;  . Eus N/A 03/05/2014    Procedure: ESOPHAGEAL ENDOSCOPIC ULTRASOUND (EUS) RADIAL;  Surgeon: Arta Silence, MD;  Location: [facility] ENDOSCOPY;  Service: Endoscopy;  Laterality: N/A;  . Eye surgery    . Laparoscopic gastric resection N/A 05/05/2014    Procedure: LAPAROSCOPIC  PROXIMAL ESOPHAGOGASTRECTOMY/DISTAL with EEA ;  Surgeon: Kaylyn Lim, MD;  Location: [place] [facility];  Service: General;  Laterality: N/A;  . Laparoscopic lysis of adhesions N/A     Procedure: LAPAROSCOPIC LYSIS OF ADHESIONS;  Surgeon: Kaylyn Lim, MD;  Location: [place];  Service: General;  Laterality: N/A;  . Jejunostomy N/A 04/15/2014    Procedure: Shanon Rosser;  Surgeon: Kaylyn Lim, MD;  Location: [place];  Service: General;  Laterality: N/A;   Prior to Admission medications   Medication Sig Start Date End Date Taking? Authorizing Provider  aspirin 81 MG chewable tablet Chew 81 mg by mouth daily.   Yes Historical Provider, MD  Cholecalciferol (VITAMIN D3) 2000 UNITS TABS Take 2,000 Units by mouth daily.   Yes Historical Provider, MD  Coenzyme Q10 (COQ10) 100 MG CAPS Take 100 mg by mouth daily.   Yes Historical Provider, MD  finasteride (PROSCAR) 5 MG tablet Take 5 mg by mouth every morning.    Yes Historical Provider, MD  glucose blood test strip Use as instructed to test blood glucose daily Dx: 250.00 03/03/14  Yes Brunetta Jeans, PA-C  isosorbide mononitrate (IMDUR) 60 MG 24 hr tablet Take 60 mg by mouth every morning.   Yes Historical Provider, MD  losartan (COZAAR) 50 MG tablet TAKE 1 TABLET BY MOUTH EVERY DAY 03/06/14  Yes Mihai Croitoru, MD  metoprolol tartrate (LOPRESSOR) 25 MG tablet Take 25 mg by mouth 2 (two) times daily.   Yes Historical Provider, MD  pravastatin (PRAVACHOL) 40 MG tablet Take 40 mg by mouth daily.   Yes Historical Provider, MD  nitroGLYCERIN (NITROSTAT) 0.4 MG SL tablet Place 0.4 mg under the tongue every 5 (five) minutes as needed for chest pain.    Historical Provider, MD   Allergies  Allergen Reactions  . Percocet [Oxycodone-Acetaminophen] Other (See Comments)    hallucinations  . Atorvastatin Other (See Comments)    Causes myalgias   . Ezetimibe Other (See Comments)    Causes dizziness  . Lovastatin Other (See Comments)    REACTION: weakness. Patient able to  tolerate pravastatin  . Ranexa [Ranolazine] Other (See Comments)    Severe constipation  . Tramadol     Nausea and Vomiting    FAMILY HISTORY:  Family History  Problem Relation Age of Onset  . Coronary artery disease Neg Hx   . Stroke Neg Hx   . Colon cancer Neg Hx   . Prostate cancer Neg Hx   . Atopy Neg Hx   . Asthma Neg Hx   . Hypertension Mother   . Hypertension Father   . Diabetes Father   . Cancer Father     colon  . Hypertension Brother   . Cancer Brother     lung  . Hypertension Sister   . Cancer Sister     lung  . Diabetes Brother   . Diabetes Sister   . Diabetes      Grandparents   . Lung cancer Brother   . Lung cancer Sister    SOCIAL HISTORY:  reports that he quit smoking about 37 years ago. His  smoking use included Cigarettes. He has a 10 pack-year smoking history. He has never used smokeless tobacco. He reports that he does not drink alcohol or use illicit drugs.  REVIEW OF SYSTEMS:  Unable due to respiratory distress and delirium.  SUBJECTIVE:   VITAL SIGNS: Temp:  [98.4 F (36.9 C)-99.6 F (37.6 C)] 99.6 F (37.6 C) (09/17 1939) Pulse Rate:  [83-115] 102 (09/17 1800) Resp:  [18-33] 27 (09/17 1800) BP: (110-158)/(39-116) 147/116 mmHg (09/17 1800) SpO2:  [86 %-97 %] 86 % (09/17 1800) Weight:  [83.6 kg (184 lb 4.9 oz)] 83.6 kg (184 lb 4.9 oz) (09/17 0500) HEMODYNAMICS:   VENTILATOR SETTINGS:   INTAKE / OUTPUT:  Intake/Output Summary (Last 24 hours) at 04/24/14 2124 Last data filed at 04/24/14 1900  Gross per 24 hour  Intake 1162.4 ml  Output   1185 ml  Net  -22.6 ml    PHYSICAL EXAMINATION: General:  Thin male in severe respiratory distress Neuro:  Oriented to self, spontaneously awake, follows simple commands. Confusion.  HEENT:  [place]/AT, PERRL, no JVD noted Cardiovascular:  Tachy (120), regular Lungs:  Poor air movement throughout.  Abdomen:  Soft, non-tender, non-distended Musculoskeletal:  No acute deformity Skin:  Intact, Dry  MMM  LABS:  CBC  Recent Labs Lab 04/22/14 0440 04/23/14 0410 04/24/14 0410  WBC 10.6* 10.3 13.2*  HGB 13.2 12.4* 12.2*  HCT 38.5* 35.9* 35.0*  PLT 130* 132* 138*   Coag's No results found for this basename: APTT, INR,  in the last 168 hours BMET  Recent Labs Lab 04/22/14 0440 04/23/14 0410 04/24/14 0410  NA 131* 139 140  K 4.1 3.6* 3.4*  CL 95* 98 100  CO2 23 26 27   BUN 36* 47* 48*  CREATININE 1.24 1.42* 1.23  GLUCOSE 123* 137* 131*   Electrolytes  Recent Labs Lab 04/22/14 0440 04/23/14 0410 04/24/14 0410  CALCIUM 8.9 8.9 8.8   Sepsis Markers No results found for this basename: LATICACIDVEN, PROCALCITON, O2SATVEN,  in the last 168 hours ABG  Recent Labs Lab 04/08/2014 1942 04/19/14 0404 04/24/14 2040  PHART 7.367 7.392 7.474*  PCO2ART 38.9 34.2* 35.4  PO2ART 95.0 70.0* 65.5*   Liver Enzymes  Recent Labs Lab 04/22/14 1000  AST 28  ALT 33  ALKPHOS 60  BILITOT 0.8  ALBUMIN 1.9*   Cardiac Enzymes  Recent Labs Lab 04/19/14 0945  TROPONINI <0.30   Glucose  Recent Labs Lab 04/23/14 1215 04/23/14 1811 04/23/14 2343 04/24/14 0556 04/24/14 1225 04/24/14 1734  GLUCAP 143* 117* 130* 147* 136* 130*    Imaging Dg Chest Port 1 View  04/23/2014   CLINICAL DATA:  Post operative exam; assess support tubes and lines  EXAM: PORTABLE CHEST - 1 VIEW  COMPARISON:  Portable chest x-ray of April 22, 2014  FINDINGS: The lung volumes remain low. There are bilateral pleural effusions and bibasilar atelectasis. The cardiopericardial silhouette is enlarged. The pulmonary vascularity is not clearly engorged. The patient has undergone previous CABG. The endotracheal tube tip lies approximately 3 cm above the crotch of the carina. The internal jugular venous catheter tip projects over the proximal SVC.  IMPRESSION: There is persistent hypoinflation with small bilateral pleural effusions and bibasilar atelectasis. There has not been significant interval change  since yesterday's study.   Electronically Signed   By: David  Martinique   On: 04/23/2014 08:25     ASSESSMENT / PLAN:  PULMONARY OETT 9/17 >>> A: Acute hypoxemic respiratory failure Likely Aspiration pneumonitis Less likely pulmonary edema Doubt  PE Bilateral small effusions P:   Full vent support Follow ABG Follow CXR PRN BD's Daily SBT May need eval for thoracentesis if unable to wean  CARDIOVASCULAR RIJ CVL 9/11 >>> A:  Tachycardia (sinus) Paroxysmal atrial fib (resolved) Hypertension (improving)  P:  Check Pro-BNP Management per Cardiology and TCTS as follows: IV amiodarone > plan to PO 9/18 Metoprolol NTG paste Will need anticoagulation for AF when able  RENAL A:  AKI Hypokalemia   P:   Follow Bmet Replace K as needed  GASTROINTESTINAL J tube 9/11 A:   GE adenocarcinoma s/p esophagogastrostomy  P:   NO OG or NG tube Management per TCTS Resume TF SUP: IV protonix  HEMATOLOGIC A:   Anemia Leukocytosis  P:  Trend CBC  INFECTIOUS A:   Aspiration pneumonitis  P:   BCx2 9/17 >>> Sputum 9/17 >>> Abx: Vanc, start date 9/17, day 1 Abx: Zosyn, start date 9/17, day 1  ENDOCRINE A:   DM   P:   Follow CBG SSI  NEUROLOGIC A:   Acute encephalopathy  P:   monitor  Georgann Housekeeper, ACNP [place] [facility] Pager [phone] or [phone]  Attending:  I have seen and examined the patient with nurse practitioner/resident and agree with the note above.   This looks like aspiration to me.  We had to suction copious thick secretions from his airway on intubation.  He appears dry on exam.  Plan full vent support, treat with broad spectrum antibiotics, hydrate, feeding per TCTS   I have personally obtained a history, examined the patient, evaluated laboratory and imaging results, formulated the assessment and plan and placed orders. CRITICAL CARE: The patient is critically ill with multiple organ systems failure and  requires high complexity decision making for assessment and support, frequent evaluation and titration of therapies, application of advanced monitoring technologies and extensive interpretation of multiple databases. Critical Care Time devoted to patient care services described in this note is 45 minutes.   Roselie Awkward, MD [facility] Pager: [phone] Cell: [phone] If no response, call [phone]

## 2014-04-24 NOTE — Progress Notes (Signed)
TCTS BRIEF SICU PROGRESS NOTE  6 Days Post-Op  S/P Procedure(s) (LRB): LAPAROSCOPIC PROXIMAL ESOPHAGOGASTRECTOMY/DISTAL with EEA  (N/A) LAPAROSCOPIC LYSIS OF ADHESIONS (N/A) JEJUNOSTOMY (N/A)   Overall stable day although delirium persists Maintaining NSR this evening O2 sats 93% on 4 L/min UOP adequate  Plan: Continue current plan  OWEN,CLARENCE H 04/24/2014 7:15 PM

## 2014-04-24 NOTE — Procedures (Signed)
Intubation Procedure Note Charles [name] [id number] 19-Aug-1934  Procedure: Intubation Indications: Respiratory insufficiency  Procedure Details Consent: Unable to obtain consent because of emergent medical necessity. Time Out: Verified patient identification, verified procedure, site/side was marked, verified correct patient position, special equipment/implants available, medications/allergies/relevent history reviewed, required imaging and test results available.  Performed  Maximum sterile technique was used including cap, gloves, hand hygiene and mask.  MAC 4    Evaluation Hemodynamic Status: BP stable throughout; O2 sats: stable throughout Patient's Current Condition: stable Complications: No apparent complications Patient did tolerate procedure well. Chest X-ray ordered to verify placement.  CXR: pending.  Georgann Housekeeper, ACNP [place] [facility] [facility]/Critical Care Pager [phone] or [phone]   Attending:  I directly supervised the procedure  Roselie Awkward, MD [facility] Pager: [phone] Cell: [phone] If no response, call [phone]

## 2014-04-24 NOTE — Progress Notes (Signed)
Patient ID: Charles [name], male   DOB: 11-23-1934, 79 y.o.   MRN: [id number] [facility] Progress Note:   6 Days Post-Op  Subjective: Mental status is confused at times;   Objective: Vital signs in last 24 hours: Temp:  [98 F (36.7 C)-99.4 F (37.4 C)] 98.5 F (36.9 C) (09/17 1200) Pulse Rate:  [83-115] 83 (09/17 1300) Resp:  [18-40] 18 (09/17 1300) BP: (110-158)/(39-84) 134/55 mmHg (09/17 1300) SpO2:  [91 %-97 %] 94 % (09/17 1300) Weight:  [184 lb 4.9 oz (83.6 kg)] 184 lb 4.9 oz (83.6 kg) (09/17 0500)  Intake/Output from previous day: 09/16 0701 - 09/17 0700 In: 1330.8 [I.V.:640.8; NG/GT:480; IV Piggyback:150] Out: 1520 [Urine:1470; Emesis/NG output:50] Intake/Output this shift: Total I/O In: 360.2 [I.V.:160.2; Other:30; NG/GT:120; IV Piggyback:50] Out: 49 [Urine:340; Emesis/NG output:150]  Physical Exam: Work of breathing is not labored.  NG out.  Trickle feeds at 20/hr.  Contrast swallow tomorrow.  Lab Results:  Results for orders placed during the hospital encounter of 04/14/2014 (from the past 48 hour(s))  GLUCOSE, CAPILLARY     Status: Abnormal   Collection Time    04/22/14  5:48 PM      Result Value Ref Range   Glucose-Capillary 121 (*) 70 - 99 mg/dL   Comment 1 Capillary Sample    GLUCOSE, CAPILLARY     Status: Abnormal   Collection Time    04/22/14 11:49 PM      Result Value Ref Range   Glucose-Capillary 130 (*) 70 - 99 mg/dL   Comment 1 Documented in Chart     Comment 2 Notify RN    BASIC METABOLIC PANEL     Status: Abnormal   Collection Time    04/23/14  4:10 AM      Result Value Ref Range   Sodium 139  137 - 147 mEq/L   Comment: DELTA CHECK NOTED   Potassium 3.6 (*) 3.7 - 5.3 mEq/L   Chloride 98  96 - 112 mEq/L   CO2 26  19 - 32 mEq/L   Glucose, Bld 137 (*) 70 - 99 mg/dL   BUN 47 (*) 6 - 23 mg/dL   Creatinine, Ser 1.42 (*) 0.50 - 1.35 mg/dL   Calcium 8.9  8.4 - 10.5 mg/dL   GFR calc non Af Amer 46 (*) >90 mL/min   GFR calc Af Amer 53  (*) >90 mL/min   Comment: (NOTE)     The eGFR has been calculated using the CKD EPI equation.     This calculation has not been validated in all clinical situations.     eGFR's persistently <90 mL/min signify possible Chronic Kidney     Disease.   Anion gap 15  5 - 15  CBC     Status: Abnormal   Collection Time    04/23/14  4:10 AM      Result Value Ref Range   WBC 10.3  4.0 - 10.5 K/uL   RBC 3.96 (*) 4.22 - 5.81 MIL/uL   Hemoglobin 12.4 (*) 13.0 - 17.0 g/dL   HCT 35.9 (*) 39.0 - 52.0 %   MCV 90.7  78.0 - 100.0 fL   MCH 31.3  26.0 - 34.0 pg   MCHC 34.5  30.0 - 36.0 g/dL   RDW 13.6  11.5 - 15.5 %   Platelets 132 (*) 150 - 400 K/uL  GLUCOSE, CAPILLARY     Status: Abnormal   Collection Time    04/23/14  6:05 AM  Result Value Ref Range   Glucose-Capillary 144 (*) 70 - 99 mg/dL   Comment 1 Documented in Chart     Comment 2 Notify RN    GLUCOSE, CAPILLARY     Status: Abnormal   Collection Time    04/23/14 12:15 PM      Result Value Ref Range   Glucose-Capillary 143 (*) 70 - 99 mg/dL   Comment 1 Notify RN    GLUCOSE, CAPILLARY     Status: Abnormal   Collection Time    04/23/14  6:11 PM      Result Value Ref Range   Glucose-Capillary 117 (*) 70 - 99 mg/dL   Comment 1 Notify RN    GLUCOSE, CAPILLARY     Status: Abnormal   Collection Time    04/23/14 11:43 PM      Result Value Ref Range   Glucose-Capillary 130 (*) 70 - 99 mg/dL   Comment 1 Documented in Chart     Comment 2 Notify RN    BASIC METABOLIC PANEL     Status: Abnormal   Collection Time    04/24/14  4:10 AM      Result Value Ref Range   Sodium 140  137 - 147 mEq/L   Potassium 3.4 (*) 3.7 - 5.3 mEq/L   Chloride 100  96 - 112 mEq/L   CO2 27  19 - 32 mEq/L   Glucose, Bld 131 (*) 70 - 99 mg/dL   BUN 48 (*) 6 - 23 mg/dL   Creatinine, Ser 1.23  0.50 - 1.35 mg/dL   Calcium 8.8  8.4 - 10.5 mg/dL   GFR calc non Af Amer 55 (*) >90 mL/min   GFR calc Af Amer 64 (*) >90 mL/min   Comment: (NOTE)     The eGFR has been  calculated using the CKD EPI equation.     This calculation has not been validated in all clinical situations.     eGFR's persistently <90 mL/min signify possible Chronic Kidney     Disease.   Anion gap 13  5 - 15  CBC     Status: Abnormal   Collection Time    04/24/14  4:10 AM      Result Value Ref Range   WBC 13.2 (*) 4.0 - 10.5 K/uL   RBC 3.88 (*) 4.22 - 5.81 MIL/uL   Hemoglobin 12.2 (*) 13.0 - 17.0 g/dL   HCT 35.0 (*) 39.0 - 52.0 %   MCV 90.2  78.0 - 100.0 fL   MCH 31.4  26.0 - 34.0 pg   MCHC 34.9  30.0 - 36.0 g/dL   RDW 14.2  11.5 - 15.5 %   Platelets 138 (*) 150 - 400 K/uL  GLUCOSE, CAPILLARY     Status: Abnormal   Collection Time    04/24/14  5:56 AM      Result Value Ref Range   Glucose-Capillary 147 (*) 70 - 99 mg/dL  GLUCOSE, CAPILLARY     Status: Abnormal   Collection Time    04/24/14 12:25 PM      Result Value Ref Range   Glucose-Capillary 136 (*) 70 - 99 mg/dL   Comment 1 Capillary Sample      Radiology/Results: Dg Chest Port 1 View  04/24/2014   CLINICAL DATA:  Assess support apparatus. The patient is postoperative from esophageal and gastric surgery.  EXAM: PORTABLE CHEST - 1 VIEW  COMPARISON:  Portable chest x-ray of April 23, 2014  FINDINGS: The lung  volumes remain low. On the right the interstitial markings are slightly increased. There is persistent blunting of the right lateral costophrenic angle consistent with pleural effusion. A stable dense 7 mm diameter granuloma is noted on the right. The cardiac silhouette where visualized is mildly enlarged. The pulmonary vascularity is not engorged.  The trachea is not intubated. There is an esophageal tube in the proximal esophagus with the tip projecting at the level of the clavicular heads and the proximal port projecting over the right T1 transverse process. The right internal jugular venous catheter tip overlies the midportion of the SVC. There is a tubular structure presumably connected to the patient's known  jejunostomy tube that projects over the left epigastrium. This is unchanged in appearance and may reflect a esophageal tube rather than an endotracheal tube.  IMPRESSION: 1. There has been slight interval increase in the interstitial density in the right lung. Both lungs remain hypoinflated. 2. The support tubes and lines are in appropriate position. 3. The findings were discussed by me with the patient's nurse, Vickii Penna, RN, at 8:18 a.m. on 24 April 2014.   Electronically Signed   By: David  Martinique   On: 04/24/2014 08:16   Dg Chest Port 1 View  04/23/2014   CLINICAL DATA:  Post operative exam; assess support tubes and lines  EXAM: PORTABLE CHEST - 1 VIEW  COMPARISON:  Portable chest x-ray of April 22, 2014  FINDINGS: The lung volumes remain low. There are bilateral pleural effusions and bibasilar atelectasis. The cardiopericardial silhouette is enlarged. The pulmonary vascularity is not clearly engorged. The patient has undergone previous CABG. The endotracheal tube tip lies approximately 3 cm above the crotch of the carina. The internal jugular venous catheter tip projects over the proximal SVC.  IMPRESSION: There is persistent hypoinflation with small bilateral pleural effusions and bibasilar atelectasis. There has not been significant interval change since yesterday's study.   Electronically Signed   By: David  Martinique   On: 04/23/2014 08:25    Anti-infectives: Anti-infectives   Start     Dose/Rate Route Frequency Ordered Stop   04/16/2014 1800  cefOXitin (MEFOXIN) 2 g in dextrose 5 % 50 mL IVPB     2 g 100 mL/hr over 30 Minutes Intravenous 4 times per day 04/14/2014 1601 04/19/14 1237   04/17/14 1407  cefUROXime (ZINACEF) 1.5 g in dextrose 5 % 50 mL IVPB     1.5 g 100 mL/hr over 30 Minutes Intravenous 60 min pre-op 04/17/14 1407 04/09/2014 0839      Assessment/Plan: Problem List: Patient Active Problem List   Diagnosis Date Noted  . Atrial fibrillation with rapid ventricular  response 04/21/2014  . Volume overload 04/21/2014  . Esophageal cancer 04/28/2014  . Pre-operative clearance 03/17/2014  . Contact dermatitis 03/05/2014  . Gastroesophageal cancer 10/28/2013  . Second degree AV block 10/25/2013  . CVA (cerebral infarction) 08/06/2013  . TIA (transient ischemic attack) 08/06/2013  . H/O: CVA (cerebrovascular accident) 08/06/2013  . Unspecified constipation 06/14/2013  . S/P SVG-RI/OM DES 05/06/13- staged DES to RCA 06/07/13 06/08/2013  . Chronic renal insufficiency, stage III (moderate) 06/08/2013  . RBBB 06/08/2013  . Angina, class III 06/07/2013  . Anxiety and depression 05/14/2013  . CAD- S/P CABG x 5 (LIMA-LAD, SVG-RI-OM1, SVG to distal RCA, SVG-PDA) 01/21/13 01/23/2013  . Left main coronary artery disease 01/16/2013  . Abnormal nuclear cardiac imaging test - Inferior Ischemia 01/07/2013  . Yellow jacket sting 04/20/2012  . Annual physical exam 02/17/2012  . Fatigue  01/13/2012  . Onychomycosis 11/29/2010  . GLAUCOMA 12/16/2009  . DIABETES MELLITUS, TYPE II 06/07/2007  . INSOMNIA-SLEEP DISORDER-UNSPEC 05/07/2007  . HYPERLIPIDEMIA 03/01/2007  . HYPERTENSION 03/01/2007    WBC up a bit today.  Check swallow in AM.   6 Days Post-Op    LOS: 6 days   Matt B. Hassell Done, MD, [facility] [phone] beeper [phone]  04/24/2014 2:47 PM

## 2014-04-24 NOTE — Procedures (Signed)
Intubation Procedure Note KOLYN ROZARIO [id number] April 29, 1935  Procedure: Intubation Indications: Airway protection and maintenance  Procedure Details Consent: Risks of procedure as well as the alternatives and risks of each were explained to the (patient/caregiver).  Consent for procedure obtained. Time Out: Verified patient identification, verified procedure, site/side was marked, verified correct patient position, special equipment/implants available, medications/allergies/relevent history reviewed, required imaging and test results available.  Performed  Maximum sterile technique was used.  4    Evaluation Hemodynamic Status: Transient hypotension resolved spontaneously; O2 sats: stable throughout Patient's Current Condition: stable Complications: No apparent complications Patient did tolerate procedure well. Chest X-ray ordered to verify placement.  CXR: pending.  Patient intubated at 2115 with 7.5 ETT.  Patient tolerated procedure well.  Marita Snellen B 04/24/2014

## 2014-04-24 NOTE — Progress Notes (Signed)
Patient Name: Charles [name] Date of Encounter: 04/24/2014  Principal Problem:   Esophageal cancer Active Problems:   DIABETES MELLITUS, TYPE II   CAD- S/P CABG x 5 (LIMA-LAD, SVG-RI-OM1, SVG to distal RCA, SVG-PDA) 01/21/13   S/P SVG-RI/OM DES 05/06/13- staged DES to RCA 06/07/13   Atrial fibrillation with rapid ventricular response   Volume overload   Length of Stay: 6  SUBJECTIVE  Intermittently disoriented and slightly agitated. Calm and oriented right now. Improved bowel sounds, tolerating J-tube feeds. NGT just removed. Very brief AF overnight.  CURRENT MEDS . antiseptic oral rinse  7 mL Mouth Rinse QID  . chlorhexidine  15 mL Mouth Rinse BID  . enoxaparin (LOVENOX) injection  30 mg Subcutaneous Q24H  . insulin aspart  0-15 Units Subcutaneous 4 times per day  . metoprolol  5 mg Intravenous 4 times per day  . nitroGLYCERIN  0.5 inch Topical 4 times per day  . pantoprazole (PROTONIX) IV  40 mg Intravenous Q12H  . sodium chloride  10 mL Intravenous Q12H    OBJECTIVE   Intake/Output Summary (Last 24 hours) at 04/24/14 0851 Last data filed at 04/24/14 0818  Gross per 24 hour  Intake 1300.8 ml  Output   1685 ml  Net -384.2 ml   Filed Weights   04/22/14 0500 04/23/14 0500 04/24/14 0500  Weight: 87.408 kg (192 lb 11.2 oz) 84.3 kg (185 lb 13.6 oz) 83.6 kg (184 lb 4.9 oz)    PHYSICAL EXAM Filed Vitals:   04/24/14 0500 04/24/14 0600 04/24/14 0700 04/24/14 0800  BP: 141/82 114/39 131/53 112/84  Pulse: 103 112 93 103  Temp:    98.4 F (36.9 C)  TempSrc:    Oral  Resp: 23 31 21 27   Height:      Weight: 83.6 kg (184 lb 4.9 oz)     SpO2: 93% 91% 95% 93%   General: Alert, oriented x3 while I was there, mumbling to himself later, no distress Head: no evidence of trauma, PERRL, EOMI, no exophtalmos or lid lag, no myxedema, no xanthelasma; normal ears, nose and oropharynx Neck: normal jugular venous pulsations and no hepatojugular reflux; brisk carotid pulses without  delay and no carotid bruits Chest: reduced breath sounds bilaterally Cardiovascular: normal position and quality of the apical impulse, regular rhythm, normal first and second heart sounds, no rubs or gallops, no murmur Abdomen: no tenderness or distention, no masses by palpation, no abnormal pulsatility or arterial bruits, normal bowel sounds, no hepatosplenomegaly Extremities: no clubbing, cyanosis or edema; 2+ radial, ulnar and brachial pulses bilaterally; 2+ right femoral, posterior tibial and dorsalis pedis pulses; 2+ left femoral, posterior tibial and dorsalis pedis pulses; no subclavian or femoral bruits Neurological: grossly nonfocal  LABS  CBC  Recent Labs  04/23/14 0410 04/24/14 0410  WBC 10.3 13.2*  HGB 12.4* 12.2*  HCT 35.9* 35.0*  MCV 90.7 90.2  PLT 132* 169*   Basic Metabolic Panel  Recent Labs  04/23/14 0410 04/24/14 0410  NA 139 140  K 3.6* 3.4*  CL 98 100  CO2 26 27  GLUCOSE 137* 131*  BUN 47* 48*  CREATININE 1.42* 1.23  CALCIUM 8.9 8.8   Liver Function Tests  Recent Labs  04/22/14 1000  AST 28  ALT 33  ALKPHOS 60  BILITOT 0.8  PROT 5.6*  ALBUMIN 1.9*    Recent Labs  04/22/14 1000  TSH 4.150    Radiology Studies Imaging results have been reviewed and Dg Chest [facility]  04/24/2014   CLINICAL DATA:  Assess support apparatus. The patient is postoperative from esophageal and gastric surgery.  EXAM: PORTABLE CHEST - 1 VIEW  COMPARISON:  Portable chest x-ray of April 23, 2014  FINDINGS: The lung volumes remain low. On the right the interstitial markings are slightly increased. There is persistent blunting of the right lateral costophrenic angle consistent with pleural effusion. A stable dense 7 mm diameter granuloma is noted on the right. The cardiac silhouette where visualized is mildly enlarged. The pulmonary vascularity is not engorged.  The trachea is not intubated. There is an esophageal tube in the proximal esophagus with the tip  projecting at the level of the clavicular heads and the proximal port projecting over the right T1 transverse process. The right internal jugular venous catheter tip overlies the midportion of the SVC. There is a tubular structure presumably connected to the patient's known jejunostomy tube that projects over the left epigastrium. This is unchanged in appearance and may reflect a esophageal tube rather than an endotracheal tube.  IMPRESSION: 1. There has been slight interval increase in the interstitial density in the right lung. Both lungs remain hypoinflated. 2. The support tubes and lines are in appropriate position. 3. The findings were discussed by me with the patient's nurse, Vickii Penna, RN, at 8:18 a.m. on 24 April 2014.   Electronically Signed   By: David  Martinique   On: 04/24/2014 08:16   Dg Chest Port 1 View  04/23/2014   CLINICAL DATA:  Post operative exam; assess support tubes and lines  EXAM: PORTABLE CHEST - 1 VIEW  COMPARISON:  Portable chest x-ray of April 22, 2014  FINDINGS: The lung volumes remain low. There are bilateral pleural effusions and bibasilar atelectasis. The cardiopericardial silhouette is enlarged. The pulmonary vascularity is not clearly engorged. The patient has undergone previous CABG. The endotracheal tube tip lies approximately 3 cm above the crotch of the carina. The internal jugular venous catheter tip projects over the proximal SVC.  IMPRESSION: There is persistent hypoinflation with small bilateral pleural effusions and bibasilar atelectasis. There has not been significant interval change since yesterday's study.   Electronically Signed   By: David  Martinique   On: 04/23/2014 08:25    TELE Mostly SR/STach, frequent PACs/bigeminy, one brief episode of AFib lasted < 10 minutes   ASSESSMENT AND PLAN Continue IV amiodarone today. Transition to enteral route tomorrow. Change metoprolol from IV to J tube today. BUN still high, creat better. Maybe resume diuretics  tomorrow if still improving. Need to start anticoagulants for AF when it is safe. Still possibility he may need a thoracentesis.   Sanda Klein, MD, [facility] [phone] office [phone] pager 04/24/2014 8:51 AM

## 2014-04-24 NOTE — Progress Notes (Signed)
Patient ID: Charles [name], male   DOB: 1935/01/20, 79 y.o.   MRN: [id number] TCTS DAILY ICU PROGRESS NOTE                   [address]            [place] [address]          [phone]   6 Days Post-Op Procedure(s) (LRB): LAPAROSCOPIC PROXIMAL ESOPHAGOGASTRECTOMY/DISTAL with EEA  (N/A) LAPAROSCOPIC LYSIS OF ADHESIONS (N/A) JEJUNOSTOMY (N/A)  Total Length of Stay:  LOS: 6 days   Subjective: Still confused  But improved, no fever or chills   Objective: Vital signs in last 24 hours: Temp:  [98 F (36.7 C)-99.4 F (37.4 C)] 99 F (37.2 C) (09/17 0400) Pulse Rate:  [84-115] 112 (09/17 0600) Cardiac Rhythm:  [-] Normal sinus rhythm (09/17 0400) Resp:  [17-40] 31 (09/17 0600) BP: (110-158)/(39-106) 114/39 mmHg (09/17 0600) SpO2:  [91 %-97 %] 91 % (09/17 0600) FiO2 (%):  [55 %] 55 % (09/16 1000) Weight:  [184 lb 4.9 oz (83.6 kg)] 184 lb 4.9 oz (83.6 kg) (09/17 0500)  Filed Weights   04/22/14 0500 04/23/14 0500 04/24/14 0500  Weight: 192 lb 11.2 oz (87.408 kg) 185 lb 13.6 oz (84.3 kg) 184 lb 4.9 oz (83.6 kg)    Weight change: -1 lb 8.7 oz (-0.7 kg)   Hemodynamic parameters for last 24 hours:    Intake/Output from previous day: 09/16 0701 - 09/17 0700 In: 1284.1 [I.V.:614.1; NG/GT:460; IV Piggyback:150] Out: 2440 [Urine:1470; Emesis/NG output:50]  Intake/Output this shift:    Current Meds: Scheduled Meds: . antiseptic oral rinse  7 mL Mouth Rinse QID  . chlorhexidine  15 mL Mouth Rinse BID  . enoxaparin (LOVENOX) injection  30 mg Subcutaneous Q24H  . insulin aspart  0-15 Units Subcutaneous 4 times per day  . metoprolol  5 mg Intravenous 4 times per day  . nitroGLYCERIN  0.5 inch Topical 4 times per day  . pantoprazole (PROTONIX) IV  40 mg Intravenous Q12H  . sodium chloride  10 mL Intravenous Q12H   Continuous Infusions: . amiodarone 30 mg/hr (04/24/14 0039)  . feeding supplement (VITAL AF 1.2 CAL)    . lactated ringers 10 mL/hr (04/14/2014  1902)   PRN Meds:.fentaNYL, Influenza vac split quadrivalent PF, metoprolol, ondansetron (ZOFRAN) IV, pneumococcal 23 valent vaccine, potassium chloride  General appearance: alert, cooperative and confused mildly Neurologic: intact Heart: regular rate and rhythm, S1, S2 normal, no murmur, click, rub or gallop Lungs: diminished breath sounds bibasilar Abdomen: soft, non-tender; bowel sounds normal; no masses,  no organomegaly Extremities: extremities normal, atraumatic, no cyanosis or edema and Homans sign is negative, no sign of DVT Wound: intact  Lab Results: CBC: Recent Labs  04/23/14 0410 04/24/14 0410  WBC 10.3 13.2*  HGB 12.4* 12.2*  HCT 35.9* 35.0*  PLT 132* 138*   BMET:  Recent Labs  04/23/14 0410 04/24/14 0410  NA 139 140  K 3.6* 3.4*  CL 98 100  CO2 26 27  GLUCOSE 137* 131*  BUN 47* 48*  CREATININE 1.42* 1.23  CALCIUM 8.9 8.8    PT/INR: No results found for this basename: LABPROT, INR,  in the last 72 hours Radiology: Dg Chest Port 1 View  04/23/2014   CLINICAL DATA:  Post operative exam; assess support tubes and lines  EXAM: PORTABLE CHEST - 1 VIEW  COMPARISON:  Portable chest x-ray of April 22, 2014  FINDINGS: The lung volumes remain  low. There are bilateral pleural effusions and bibasilar atelectasis. The cardiopericardial silhouette is enlarged. The pulmonary vascularity is not clearly engorged. The patient has undergone previous CABG. The endotracheal tube tip lies approximately 3 cm above the crotch of the carina. The internal jugular venous catheter tip projects over the proximal SVC.  IMPRESSION: There is persistent hypoinflation with small bilateral pleural effusions and bibasilar atelectasis. There has not been significant interval change since yesterday's study.   Electronically Signed   By: David  Martinique   On: 04/23/2014 08:25     Assessment/Plan: S/P Procedure(s) (LRB): LAPAROSCOPIC PROXIMAL ESOPHAGOGASTRECTOMY/DISTAL with EEA   (N/A) LAPAROSCOPIC LYSIS OF ADHESIONS (N/A) JEJUNOSTOMY (N/A) Mobilize d/c ng, gartrofraffin swallow tomorrow Keep npo for now Cr, bun stable Hypokalemia treated Increase tube feeding   Charles [name] 04/24/2014 8:16 AM

## 2014-04-25 ENCOUNTER — Inpatient Hospital Stay (HOSPITAL_COMMUNITY): Payer: Medicare Other

## 2014-04-25 DIAGNOSIS — J90 Pleural effusion, not elsewhere classified: Secondary | ICD-10-CM

## 2014-04-25 LAB — POCT I-STAT 3, ART BLOOD GAS (G3+)
ACID-BASE DEFICIT: 13 mmol/L — AB (ref 0.0–2.0)
Acid-base deficit: 2 mmol/L (ref 0.0–2.0)
Acid-base deficit: 6 mmol/L — ABNORMAL HIGH (ref 0.0–2.0)
Bicarbonate: 14.9 mEq/L — ABNORMAL LOW (ref 20.0–24.0)
Bicarbonate: 25 mEq/L — ABNORMAL HIGH (ref 20.0–24.0)
Bicarbonate: 22.3 mEq/L (ref 20.0–24.0)
O2 SAT: 23 %
O2 Saturation: 77 %
O2 Saturation: 78 %
PCO2 ART: 51.6 mmHg — AB (ref 35.0–45.0)
PCO2 ART: 57.5 mmHg — AB (ref 35.0–45.0)
PH ART: 7.294 — AB (ref 7.350–7.450)
PH ART: 7.2 — AB (ref 7.350–7.450)
TCO2: 16 mmol/L (ref 0–100)
TCO2: 27 mmol/L (ref 0–100)
TCO2: 24 mmol/L (ref 0–100)
pCO2 arterial: 43.6 mmHg (ref 35.0–45.0)
pH, Arterial: 7.14 — CL (ref 7.350–7.450)
pO2, Arterial: 19 mmHg — CL (ref 80.0–100.0)
pO2, Arterial: 53 mmHg — ABNORMAL LOW (ref 80.0–100.0)
pO2, Arterial: 55 mmHg — ABNORMAL LOW (ref 80.0–100.0)

## 2014-04-25 LAB — GLUCOSE, CAPILLARY
GLUCOSE-CAPILLARY: 147 mg/dL — AB (ref 70–99)
Glucose-Capillary: 182 mg/dL — ABNORMAL HIGH (ref 70–99)

## 2014-04-25 LAB — BASIC METABOLIC PANEL
Anion gap: 23 — ABNORMAL HIGH (ref 5–15)
BUN: 54 mg/dL — ABNORMAL HIGH (ref 6–23)
CALCIUM: 8.9 mg/dL (ref 8.4–10.5)
CO2: 19 meq/L (ref 19–32)
CREATININE: 1.86 mg/dL — AB (ref 0.50–1.35)
Chloride: 101 mEq/L (ref 96–112)
GFR calc Af Amer: 39 mL/min — ABNORMAL LOW (ref >90)
GFR, EST NON AFRICAN AMERICAN: 33 mL/min — AB (ref >90)
GLUCOSE: 112 mg/dL — AB (ref 70–99)
Potassium: 4.2 mEq/L (ref 3.7–5.3)
Sodium: 143 mEq/L (ref 137–147)

## 2014-04-25 LAB — CBC
HCT: 37.8 % — ABNORMAL LOW (ref 39.0–52.0)
Hemoglobin: 12.4 g/dL — ABNORMAL LOW (ref 13.0–17.0)
MCH: 31.2 pg (ref 26.0–34.0)
MCHC: 32.8 g/dL (ref 30.0–36.0)
MCV: 95.2 fL (ref 78.0–100.0)
Platelets: 128 10*3/uL — ABNORMAL LOW (ref 150–400)
RBC: 3.97 MIL/uL — ABNORMAL LOW (ref 4.22–5.81)
RDW: 15 % (ref 11.5–15.5)
WBC: 6.5 10*3/uL (ref 4.0–10.5)

## 2014-04-25 LAB — PRO B NATRIURETIC PEPTIDE: PRO B NATRI PEPTIDE: 1224 pg/mL — AB (ref 0–450)

## 2014-04-25 MED ORDER — ALBUMIN HUMAN 5 % IV SOLN
12.5000 g | Freq: Once | INTRAVENOUS | Status: AC
  Administered 2014-04-25: 12.5 g via INTRAVENOUS

## 2014-04-25 MED ORDER — SODIUM BICARBONATE 8.4 % IV SOLN
INTRAVENOUS | Status: DC
  Administered 2014-04-25: 04:00:00 via INTRAVENOUS
  Filled 2014-04-25 (×2): qty 850

## 2014-04-25 MED ORDER — SODIUM CHLORIDE 0.9 % IV SOLN
0.0300 [IU]/min | INTRAVENOUS | Status: DC
  Administered 2014-04-25: 0.03 [IU]/min via INTRAVENOUS
  Filled 2014-04-25: qty 2

## 2014-04-25 MED ORDER — ALBUMIN HUMAN 5 % IV SOLN
INTRAVENOUS | Status: AC
  Administered 2014-04-25: 12.5 g
  Filled 2014-04-25: qty 250

## 2014-04-25 MED ORDER — DEXTROSE 5 % IV SOLN
0.5000 ug/min | INTRAVENOUS | Status: DC
  Administered 2014-04-25: 5 ug/min via INTRAVENOUS
  Filled 2014-04-25: qty 4

## 2014-04-25 MED ORDER — NOREPINEPHRINE BITARTRATE 1 MG/ML IV SOLN
2.0000 ug/min | INTRAVENOUS | Status: DC
  Administered 2014-04-25: 10 ug/min via INTRAVENOUS
  Filled 2014-04-25: qty 16

## 2014-04-25 MED ORDER — HEPARIN (PORCINE) IN NACL 100-0.45 UNIT/ML-% IJ SOLN
1200.0000 [IU]/h | INTRAMUSCULAR | Status: DC
  Administered 2014-04-25: 1200 [IU]/h via INTRAVENOUS
  Filled 2014-04-25: qty 250

## 2014-04-25 MED ORDER — FUROSEMIDE 10 MG/ML IJ SOLN
20.0000 mg | Freq: Once | INTRAMUSCULAR | Status: AC
  Administered 2014-04-25: 20 mg via INTRAVENOUS
  Filled 2014-04-25: qty 2

## 2014-04-25 MED ORDER — SODIUM CHLORIDE 0.9 % IV SOLN
INTRAVENOUS | Status: DC
  Administered 2014-04-25 (×2): 1 mL via INTRAVENOUS

## 2014-04-25 MED FILL — Medication: Qty: 1 | Status: AC

## 2014-04-30 LAB — POCT I-STAT, CHEM 8
BUN: 51 mg/dL — ABNORMAL HIGH (ref 6–23)
Calcium, Ion: 1.2 mmol/L (ref 1.13–1.30)
Chloride: 106 mEq/L (ref 96–112)
Creatinine, Ser: 2 mg/dL — ABNORMAL HIGH (ref 0.50–1.35)
Glucose, Bld: 111 mg/dL — ABNORMAL HIGH (ref 70–99)
HCT: 39 % (ref 39.0–52.0)
Hemoglobin: 13.3 g/dL (ref 13.0–17.0)
Potassium: 4 mEq/L (ref 3.7–5.3)
Sodium: 138 mEq/L (ref 137–147)
TCO2: 20 mmol/L (ref 0–100)

## 2014-05-07 ENCOUNTER — Encounter: Payer: Self-pay | Admitting: Cardiovascular Disease

## 2014-05-08 NOTE — Progress Notes (Signed)
Increased PEEP to +8 per MD order.  Patient did not tolerate increase and desaturated into the low 80's with PIP of 39.  PEEP was lowered back to +5, RT will continue to monitor.

## 2014-05-08 NOTE — Op Note (Signed)
CARDIOTHORACIC SURGERY OPERATIVE NOTE  Date of Procedure:  05-20-14  Preoperative Diagnosis: Right Pleural Effusion  Postoperative Diagnosis: Same  Procedure:   Right chest tube placement  Surgeon:   Valentina Gu. Roxy Manns, MD  Anesthesia:   Intravenous sedation    DETAILS OF THE OPERATIVE PROCEDURE  The right chest was prepared and draped in a sterile manner. A small incision was made and a 28 French straight chest tube was placed through the incision into the pleural space. The tube was secured to the skin and connected to a closed suction collection device. Approximately 650 mL serous fluid evacuated immediately. A portable CXR was ordered. There were no complications.    Valentina Gu. Roxy Manns, MD 05/20/14 3:57 AM

## 2014-05-08 NOTE — Progress Notes (Signed)
At 0220 the respiratory therapist turned the patient's PEEP up to 8 and the patient was maintaining O2 saturations in the low 90s.  At 0245 the respiratory therapist was in the patient's room attempting to draw an ABG when it was noted that the O2 saturation dropped and was maintaining in the mid to high 80s.  We began bagging the patient on 100% and attempted to lavage the patient while bagging and the heart rate dropped from the 110s to the 50s.  No pulse was palpated, the patient went asystolic and at 7026 a code was called.  One amp of EPI was pushed.  At 0255 a 1L fluid bolus was started and the patient's heart rhythm was noted to be A fib with a rate of 81 and a palpable carotid pulse.  At 0259 the heart rate dropped to 29 and one amp of atropine was administered.  A multitude of labs were drawn and sent including a CBC, BMET, and a BNP.  The patient's family was notified that we were currently coding the patient and it was suggested that they come to the hospital, the stated that they would be here shortly.  At 0301 the patient lost his pulse and CPR was resumed.  At 0302 a amp of bicarb was pushed.  At 0303 the patient's heart rhythm was noted to be a flutter and the rate was 141 with a palpable carotid pulse.  Levophed was started at 10 mcg/min.  At 0305 the pressure dropped to a SBP of 61 and levophed was increased to 20.  At 0309 levophed was increased to 30 mcg/min.  By 3785 the heart rate dropped to 70 and 1/2 amp of EPI was pushed.  At 0312 the heart rate was in the 30s and a full amp of EPI was pushed.  An Epi drip was started at 0317 at a rate of 5 mcg/min and an albumin was hung.  At 0325 a second albumin was hung, the patient's heart rhythm was a flutter with a rate of 116 and palpable carotid pulses.  At 0331 the patient's systolic blood pressure was in the 50s.  The epi drip was increased to 10 mcg/min, levo was at 30 and 1 amp of bicarb was given.  At 0333 one albumin was given.  At 8850 the  systolic blood pressure was in the 50s and the levo was increased to 50.  A portable chest x-ray was obtained.  At 0338 1 amp of atropine was administered as well as an amp of epinephrine.  At 0340 a femoral arterial line was placed and at 0349 one amp of bicarb was administered.  At 0352 a right pleural chest tube was placed by Dr. Roxy Manns and approximately 650 mL of serosanguinous drainage was noted.  At 0402 vasopressin was started at 0.03 and epi was increased to 15.  A portable chest xray was obtained.  The family arrived to the hospital and Dr. Lake Bells went to speak to them and explain the circumstances.  The patient was made a partial code with no CPR or defibrillation and the patient is already on maximum doses of vasopressors.  Heparin was started at 0445 at a rate of 1200 units/hr.  The patient currently has a SBP in the high 80s-low 90s via the a line.  Family is in the room with the patient.  A chaplain has been paged to come see the patient.  Will continue to monitor the patient closely and attempt to meet the needs  of the family.

## 2014-05-08 NOTE — Progress Notes (Signed)
The medical examiner was called and the situation was explained.  She stated that this was not an ME case and instructed that we could proceed with post-mortem care.

## 2014-05-08 NOTE — Progress Notes (Signed)
Patient ID: Charles [name], male   DOB: [birth year], 79 y.o.   MRN: [id number] The patient experienced respiratory distress this evening requiring intubation. He continued to have problems with hypoxia and suffered cardiac arrest. CPR and aggressive resuscitation measures were started. He finally reached a relative steady state on large amounts of epi, neo, and bicarb drips. Critical care spoke with family and they indicated they did not want further CPR. He will continue with maximal medical management.

## 2014-05-08 NOTE — Progress Notes (Signed)
eLink Physician-Brief Progress Note Patient Name: Charles [name] DOB: Oct 07, 1934 MRN: [id number]   Date of Service  05/22/2014  HPI/Events of Note  Patient with acute drop in o2 saturation (down to 70s) and HR (down to 30-40s), then loss of pulse, asystole on monitor, with cyanotic appearance, ACLS started, one round, then ROSC, lost pulse within 3 mins, ACLS restarted, 2 rounds of ACLS, got 1 of epi and 1 of atropine, ROSC.  0312 -->loss of pulse, ACLS restarted, 0316 ROSC.  Started on levophed   eICU Interventions  Check EKG, BMP, ABG, lactic acid     Intervention Category Major Interventions: Arrhythmia - evaluation and management  Chukwudi Ewen 22-May-2014, 2:57 AM

## 2014-05-08 NOTE — Progress Notes (Signed)
05-13-14 0600  Clinical Encounter Type  Visited With Patient and family together;Health care provider  Visit Type Initial;Death;Patient actively dying  Referral From Nurse  Spiritual Encounters  Spiritual Needs Prayer;Emotional;Grief support  Stress Factors  Patient Stress Factors None identified  Family Stress Factors Loss;Major life changes   Chaplain responded to a page that requested a chaplain's presence in 2S. Chaplain reported to 2S and was notified that the patient was actively dying and that his family was in the room. Chaplain was notified that the patient coded earlier in the day and that the patient had been declining ever since. Chaplain entered into the room with the patient and introduced himself to the patient's wife and the patient's daughter. Both family members were already grieving and anticipating the death of the patient. Patient's wife asked me to pray. Chaplain prayed with family members for peace and God's presence through this difficult night and morning. Chaplain also prayed for the celebration of the patient and the gift he has meant to this world. Both patient's wife and patient's daughter wept openly and said their goodbyes. Patient's wife explained that two days ago was their anniversary. Patient's daughter also explained that her brother was present when the patient was doing much better but had to leave out the country for his job. Patient passed away roughly 30 minutes after prayer. Patient's daughter and wife continued to grieve but both emotionally thought it best to head home, as seeing the patient without life was painful. Chaplain referred family to patient placement and encouraged them to call when they were emotionally able. Chaplain escorted family to their car. Chaplain will continue to provide emotional and spiritual support to patient's family as needed. Cassiopeia Florentino, Claudius Sis, Chaplain 6:30 AM

## 2014-05-08 NOTE — Code Documentation (Addendum)
TCTS CODE DOCUMENTATION NOTE  7 Days Post-Op  S/P Procedure(s) (LRB): LAPAROSCOPIC PROXIMAL ESOPHAGOGASTRECTOMY/DISTAL with EEA  (N/A) LAPAROSCOPIC LYSIS OF ADHESIONS (N/A) JEJUNOSTOMY (N/A)   Patient developed acute onset and rapidly progressive respiratory distress over the course of the evening characterized by hypoxemia with air hunger.  He was evaluated by Dr Lake Bells and semi-electively intubated approx 11pm, and by report there were copious airway secretions suspicious for possible aspiration. pCXR obtained after intubation revealed appropriate ET tube position and some improvement in bibasilar volume loss due to atelectasis/effusions R>L.  Over the subsequent 4 hours the patient continued to experience hypoxemia and PEEP was increased to 8.  Patient suddenly suffered bradycardic arrest and was notably cyanotic in appearance at the time despite bagging the patient on 100% FiO2.  During the CODE patient intermittently responded to IV epinephrine and ultimately developed semi-stable rhythm (Aflutter) and BP on high dose levophed, epi and bicarb drips, although intermittently continues to develop hypotension requiring bolus administration of epinephrine.  Right chest tube placed for moderate-large right pleural effusion with some improvement in oxygenation.  Bedside ultrasound shows no signs of significant pericardial effusion.  Plan: Will start IV heparin for possible PE.  Continue to treat for possible aspiration pneumonia.  Continue IV pressors and volume resuscitation.  Doubt acute MI or primary cardiac event.  Prognosis poor.  Family present, updated.  All questions answered.  Charles [name] 05-16-2014 3:56 AM

## 2014-05-08 NOTE — Progress Notes (Signed)
ANTICOAGULATION CONSULT NOTE - Initial Consult  Pharmacy Consult for Heparin Indication: r/o PE  Allergies  Allergen Reactions  . Percocet [Oxycodone-Acetaminophen] Other (See Comments)    hallucinations  . Atorvastatin Other (See Comments)    Causes myalgias   . Ezetimibe Other (See Comments)    Causes dizziness  . Lovastatin Other (See Comments)    REACTION: weakness. Patient able to tolerate pravastatin  . Ranexa [Ranolazine] Other (See Comments)    Severe constipation  . Tramadol     Nausea and Vomiting    Patient Measurements: Height: 5' 10.87" (180 cm) Weight: 184 lb 4.9 oz (83.6 kg) IBW/kg (Calculated) : 74.99  Vital Signs: Temp: 99.2 F (37.3 C) (09/18 0000) Temp src: Oral (09/18 0000) BP: 96/44 mmHg (09/18 0200) Pulse Rate: 115 (09/18 0200)  Labs:  Recent Labs  04/23/14 0410 04/24/14 0410 05/25/14 0300  HGB 12.4* 12.2* 12.4*  HCT 35.9* 35.0* 37.8*  PLT 132* 138* 128*  CREATININE 1.42* 1.23 1.86*    Estimated Creatinine Clearance: 35.3 ml/min (by C-G formula based on Cr of 1.86).   Medications:  Infusions:  . sodium chloride 1 mL (05-25-2014 0042)  . amiodarone 30 mg/hr (04/24/14 2000)  . epinephrine    . fentaNYL infusion INTRAVENOUS 150 mcg/hr (05/25/14 0100)  . lactated ringers Stopped (May 25, 2014 0042)  . norepinephrine (LEVOPHED) Adult infusion    .  sodium bicarbonate 150 mEq in sterile water 1000 mL infusion    . vasopressin (PITRESSIN) infusion - *FOR SHOCK*      Assessment: 79 yo male with cardiac arrest, possible PE, for heparin  Goal of Therapy:  Heparin level 0.3-0.7 units/ml Monitor platelets by anticoagulation protocol: Yes   Plan:  Start heparin 1200 units/hr Check heparin level in 8 hours.   Caryl Pina 05/25/2014,4:23 AM

## 2014-05-08 NOTE — Code Documentation (Signed)
  Patient Name: Charles [name]   MRN: [id number]   Date of Birth/ Sex: 02/19/1935 , male      Admission Date: 05/06/2014  Attending Provider: Grace Isaac, MD  Primary Diagnosis: Esophageal cancer   Indication: Patient was noted to be hypoxic earlier this evening and was intubated.  Tube placement was confirmed however later he again developed worsening hypoxia.  He subsequently developed bradycardic arrest. Code blue was subsequently called. At the time of arrival on scene, ACLS protocol was underway.   Technical Description:  - CPR performance duration:  Compression preformed on and off between 2:57 and 3:12 see code paperwork for further documentation   - Was defibrillation or cardioversion used? No   - Was external pacer placed? No  - Was patient intubated pre/post CPR? Yes   Medications Administered: Y = Yes; Blank = No Amiodarone    Atropine  y  Calcium    Epinephrine  y  Lidocaine    Magnesium    Norepinephrine  y  Phenylephrine    Sodium bicarbonate  y  Vasopressin     Post CPR evaluation:  - Final Status - Was patient successfully resuscitated ? Yes - What is current rhythm? afib - What is current hemodynamic status? On epi and norepi pressor support  Miscellaneous Information:  - Labs sent, including: ABG,CBC, BMP  - Primary team notified?  Yes  - Family Notified? Yes- Dr. Lake Bells spoke with family  - Additional notes/ transfer status: Code ran, Dr. Lake Bells (PCCM), Dr. Roxy Manns (CVTS/Primary) and Dr. Marlou Starks (surgery) responded.  McQuaid inserted A-line and preformed bedside U/S.  Dr. Roxy Manns inserted a right side chest tube.  Left patient under care of Drs. McQuaid and [employer].     Lucious Groves, DO  05-16-14, 5:08 AM

## 2014-05-08 NOTE — Progress Notes (Signed)
[facility]  Called to bedside for cardiac arrest. After intubation Mr. Knutzen initially stabilized, but around 0130 he developed worsening hypoxemia.  The ventilator was adjusted but the hypoxemia worsened and he developed a bradycardic arrest.  CPR was initiated for bradycardic/PEA arrest.  He received CPR off an on for nearly one hour between 0252 and 0350  He received multiple amps of epinephrine and an epi gtt was started in addition to vasopressin and a bicarb gtt   On exam: Unresponsive to external stimuli ETT in place Diminished breath sounds bilaterally R>L Extremities cool, mottled Abdomen soft   Labs reviewed a profound metabolic acidosis, worsening renal failure and profound hypoxemia on ABG. CXR showed bilateral airspace disease and a pleural effusion Bedside echo showed no tamponade, hyperdynamic ventricular contractions Emergent chest tube placed in right lung, afterwards he had improved oxygenation and chest x-ray  Impression: 1) severe acute hypoxemic respiratory failure> etiology uncertain aspiration pneumonitis? PE? 2) cardiac arrest 3) metabolic acidosis due to shock  Plan: -add heparin gtt no bolus for empiric treatment of PE -continue full vasopressor support (epi gtt, vasopressin, norepi) -bicarb gtt -chest tube to suction -gradually increase PEEP to 10, watch pressure carefully  Discussed situation with Dr. Roxy Manns, Dr. Marlou Starks, and the patient's family.  Considering the prolonged CPR time at this point we will not perform more CPR or ACLS protocol as his likelihood of a good neurologic outcome is dismal.  We will continue vasopressor support (currently maximum doses), mechanical ventilation, but if he becomes pulseless we will not make further intervention.  CC time 60 minutes  Roselie Awkward, MD [facility] Pager: [phone] Cell: [phone] If no response, call [phone]

## 2014-05-08 NOTE — Progress Notes (Signed)
The patient's O2 via nasal cannula had been increased from 4 L/min to 6 L/min at 1900 and at 1910 the patient had been placed on a 100% non-rebreather due to O2 sats in the high 80s.  Upon entering the patient's room at 2000 it was noted that he was experiencing very labored breathing with a RR around 40/min and was only able to pull 250 on the incentive spirometer.  Warren Lacy was notified and asked to camera in and Dr. Chase Caller felt that he had paradoxical breathing and may need intubation.  Dr. Roxy Manns was notified and he ordered an ABG and STAT chest x-ray.  The ABG was obtained and Georgann Housekeeper, and Dr. Lake Bells arrived at bedside.  Dr. Lake Bells contacted Dr. Roxy Manns and it was decided that the patient would be intubated.  Dr. Lake Bells called the patient's daughter and updated her on the patient's status informing her that he was going to be intubated to help with his respiratory status.  Due to the delay in the portable x-ray tech being able to get to the unit Georgann Housekeeper proceeded with intubation.  The patient was intubated at 2115 and orders were written for a fentanyl drip.  An x-ray was obtained to verify placement of the ET tube. Will continue to monitor patient carefully.

## 2014-05-08 NOTE — Progress Notes (Signed)
The patient's blood pressure continued to drop. The patient's wife and daughter were present in the room and updated as to the patient's declining status.  The chaplain had arrived in the room and was with the family and at [phone] the patient went asystolic.  Two nurses verified that there was no heart beat by auscultation and the patient was pronounced dead at 0558.  A phone number was given to the family so that when they made the decision as to what to do with the body they would have the contact information.

## 2014-05-08 NOTE — Progress Notes (Signed)
Entered room of patient to obtain ABG.  The patient suddenly desaturated and vitals fell.  Began bagging the patient and lavaged with a saline bolus with no improvement. CPR performed and bagging continued until Chest tube was placed and patient stabilized.  Placed patient back on ventilator, PRVC: Tidal volume:600, Rate:14, FIO2: 100%, and PEEP:8. RT will continue to monitor.

## 2014-05-08 NOTE — Care Management Note (Signed)
    Page 1 of 1   18-May-2014     10:40:13 AM CARE MANAGEMENT NOTE 05/18/14  Patient:  Charles [name], Charles [name]   Account Number:  [id number]  Date Initiated:  05/18/14  Documentation initiated by:  Malcolm Quast  Subjective/Objective Assessment:   dx gastroesophageal CA, s/p esophagogastrectomy     Status of service:  Completed, signed off Medicare Important Message given?   (If response is "NO", the following Medicare IM given date fields will be blank) Date Medicare IM given:   Medicare IM given by:   Date Additional Medicare IM given:   Additional Medicare IM given by:    Discharge Disposition:  EXPIRED  Per UR Regulation:  Reviewed for med. necessity/level of care/duration of stay

## 2014-05-08 NOTE — Progress Notes (Signed)
CDS notified of patient's death. Not suitable for donation.

## 2014-05-08 NOTE — Progress Notes (Signed)
Arterial Line Insertion Procedure Note Charles [name] [id number] 06/29/1935  Procedure: Insertion of Arterial Line Indications: BP monitoring  Procedure Details Consent: Unable to obtain consent because of emergent medical necessity. Time Out: Verified patient identification, verified procedure, site/side was marked, verified correct patient position, special equipment/implants available, medications/allergies/relevent history reviewed, required imaging and test results available.  Performed  Maximum sterile technique was used including antiseptics, gloves, hand hygiene and sheet. Skin prep: Chlorhexidine; local anesthetic administered A single lumen arterial catheter was placed in the R femoral artery using the Seldinger technique.  Ultrasound was used to verify the patency of the vein and for real time needle guidance.  Evaluation Blood flow good Complications: No apparent complications Patient did tolerate procedure well.  MCQUAID, DOUGLAS 05/04/14, 4:24 AM

## 2014-05-08 DEATH — deceased

## 2014-05-09 ENCOUNTER — Encounter: Payer: Self-pay | Admitting: Cardiovascular Disease

## 2014-05-09 NOTE — Discharge Summary (Signed)
Physician Discharge Summary       [address]       [place] [address]             [phone]    Patient ID: Charles [name] MRN: [id number] DOB/AGE: September 07, 1934 79 y.o.  Admit date: May 14, 2014 Deceased date: 21-May-2014  Admission Diagnoses: 1. Adenocarcinoma of the GE junction 2. History of hypertension 3. History of hyperlipidemia 4. History CAD (s/p CABG x 5 (LIMA-LAD, SVG-RI-OM1, SVG to distal RCA, SVG-PDA) on 01/21/2014) and PTCA with stent 14') 5. History of TIA and CVA 6. History of DM  Discharge Diagnoses:  1. Adenocarcinoma of the GE junction 2. History of hypertension 3. History of hyperlipidemia 4. History CAD (s/p CABG x 5 (LIMA-LAD, SVG-RI-OM1, SVG to distal RCA, SVG-PDA) on 01/21/2014) and PTCA with stent 14') 5. History of TIA and CVA 6. History of DM 7. Post op a fib with RVR (converted to SR)   Consults: cardiology, pulmonary/intensive care and pharmacy  Procedure (s): 1. Laparoscopic approach with enterolysis, esophagogastrectomy with 21-mm EEA, esophagogastrostomy, upper GI endoscopy, and feeding Jejunostomy by Dr. Servando Snare and Dr. Hassell Done.  2.Right chest tube placement by 2014-05-21 by Dr. Roxy Manns History of Presenting Illness: This is a 79 year old male is seen in the office for gastric cancer. In April of 2015, the patient noted a single tarry stool. He had no other symptoms of difficulty swallowing or painful swallowing. Dr Oletta Lamas did a UGI endoscopy and bx obtained of ulcered mass of GE junction and cardia. He was diagnosed with distal esophageal cancer-poorly differentiated adeno (probably related to Barrett's esophagus.). He was initially diagnosed March and treated with radiation chemotherapy. He was seen by Dr. Hassell Done (general surgery) on 03/21/2014 and Dr. Servando Snare subsequently on May 14, 2014. It was discussed with the patient the need for laparoscopic approach, esophagogastrectomy  with 21-mm EEA, esophagogastrostomy, upper GI endoscopy,  and feeding jejunostomy. Potential risks, benefits, and complications were discussed with the patient and he agreed to proceed with surgery.   Brief Hospital Course:  Patient was extubated the evening of surgery. He was hypertensive and given Nitro and IV beta blocker.  He did have mild confusion. He did have a Fentanyl PCA but he was not using it much. It was removed on 9/12. He had the NGT and remained NPO . He was on Cefoxitin post op. He had hyponatremia. Sodium was down to 128 on 9/13. He was slowly started on tube feedings. He went into a fib with RVR on 9/14. A cardiology consult was obtained. He was placed on IV Amiodarone. He converted to sinus rhythm.  He would need anticoagulation. 2 D echo was done 9/14. LVEF 65-70%, wall motion normal, no significant valvular disease. He was given diuretics for volume overload. He needed oxygen via [place]. He then had to be placed on an NRB and his oxygen saturation was in the 80's and he had tachypnea.  Stat ABG and CXR were obtained.  CCM was consulted. Dr. Lake Bells re intubated the patient. He then became bradycardic and lost his pulse. He went asystole. A code was called and ACLS protocol was undertaken. Family arrived and decided to make him a partial code with NO CPR or defibrillation. He was started on a heparin drip.  He developed hypotension and went asystolic again. He died on 05/22/2023. Latest Vital Signs: Blood pressure 48/27, pulse 25, temperature 99.2 F (37.3 C), temperature source Oral, resp. rate 20, height 5' 10.87" (1.8 m), weight 184 lb 4.9  oz (83.6 kg), SpO2 94.00%.   Discharge Condition: Deceased  Recent laboratory studies:  Lab Results  Component Value Date   WBC 6.5 2014/05/18   HGB 13.3 18-May-2014   HCT 39.0 2014-05-18   MCV 95.2 05-18-14   PLT 128* 05/18/14   Lab Results  Component Value Date   NA 138 May 18, 2014   K 4.0 05/18/14   CL 106 2014-05-18   CO2 19 May 18, 2014   CREATININE 2.00* 2014/05/18   GLUCOSE 111* 05-18-2014        Diagnostic Studies: Dg Chest Port 1 View  05-18-2014   CLINICAL DATA:  Right-sided chest tube placement.  EXAM: PORTABLE CHEST - 1 VIEW  COMPARISON:  Chest radiograph performed earlier today at 3:25 a.m.  FINDINGS: The patient's endotracheal tube is seen ending 3-4 cm above the carina. The right IJ line is noted ending about the mid SVC.  There has been interval placement of a right sided chest tube, with resolution of the previously noted right-sided pleural effusion.  A small left pleural effusion is again seen. Bilateral central airspace opacities may reflect mild interstitial edema. No definite pneumothorax is seen.  The cardiomediastinal silhouette is borderline normal in size. The patient is status post median sternotomy, with evidence of prior CABG. No acute osseous abnormalities are seen. Cervical spinal fusion hardware is partially imaged. External pacing pads are seen.  IMPRESSION: 1. Interval placement of right-sided chest tube, with resolution of the right-sided pleural effusion. Mild underlying airspace opacity noted. 2. Small left pleural effusion again seen. Bilateral central airspace opacities may reflect mild interstitial edema.   Electronically Signed   By: Garald Balding M.D.   On: 05/18/2014 05:03    Signed: Lars Pinks MPA-C 05/09/2014, 2:38 PM

## 2014-05-12 ENCOUNTER — Ambulatory Visit: Payer: Medicare Other | Admitting: Oncology

## 2014-07-17 ENCOUNTER — Encounter (HOSPITAL_COMMUNITY): Payer: Self-pay | Admitting: Cardiovascular Disease

## 2014-12-24 IMAGING — CR DG CHEST 2V
2 series · 2 of 2 positions shown · non-contrast
Comparison: Chest x-ray 01/23/2013.

CLINICAL DATA: Status post CABG in January 2013.  Left-sided chest
pain.

CHEST - 2 VIEW

[w chest pa]
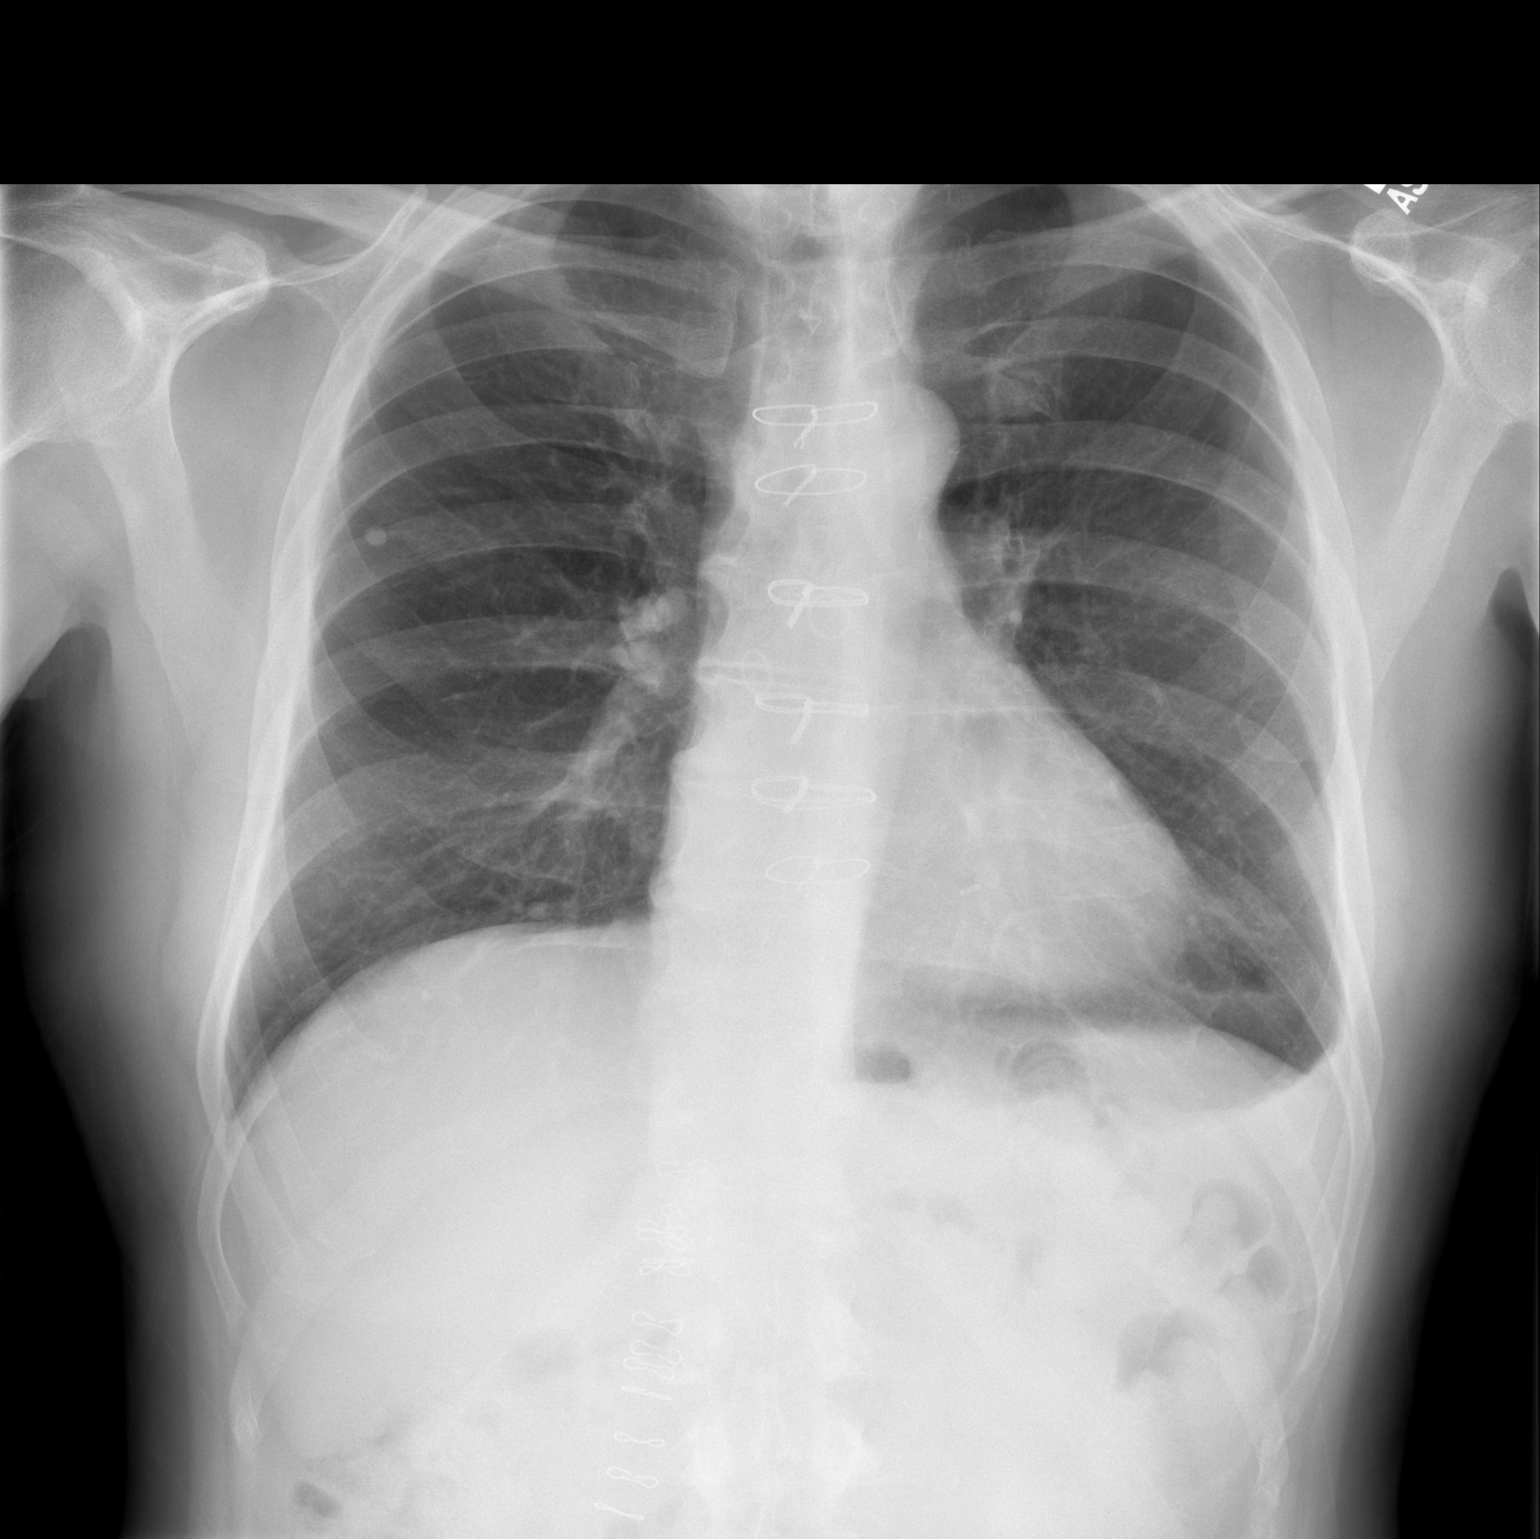

[w chest lat]
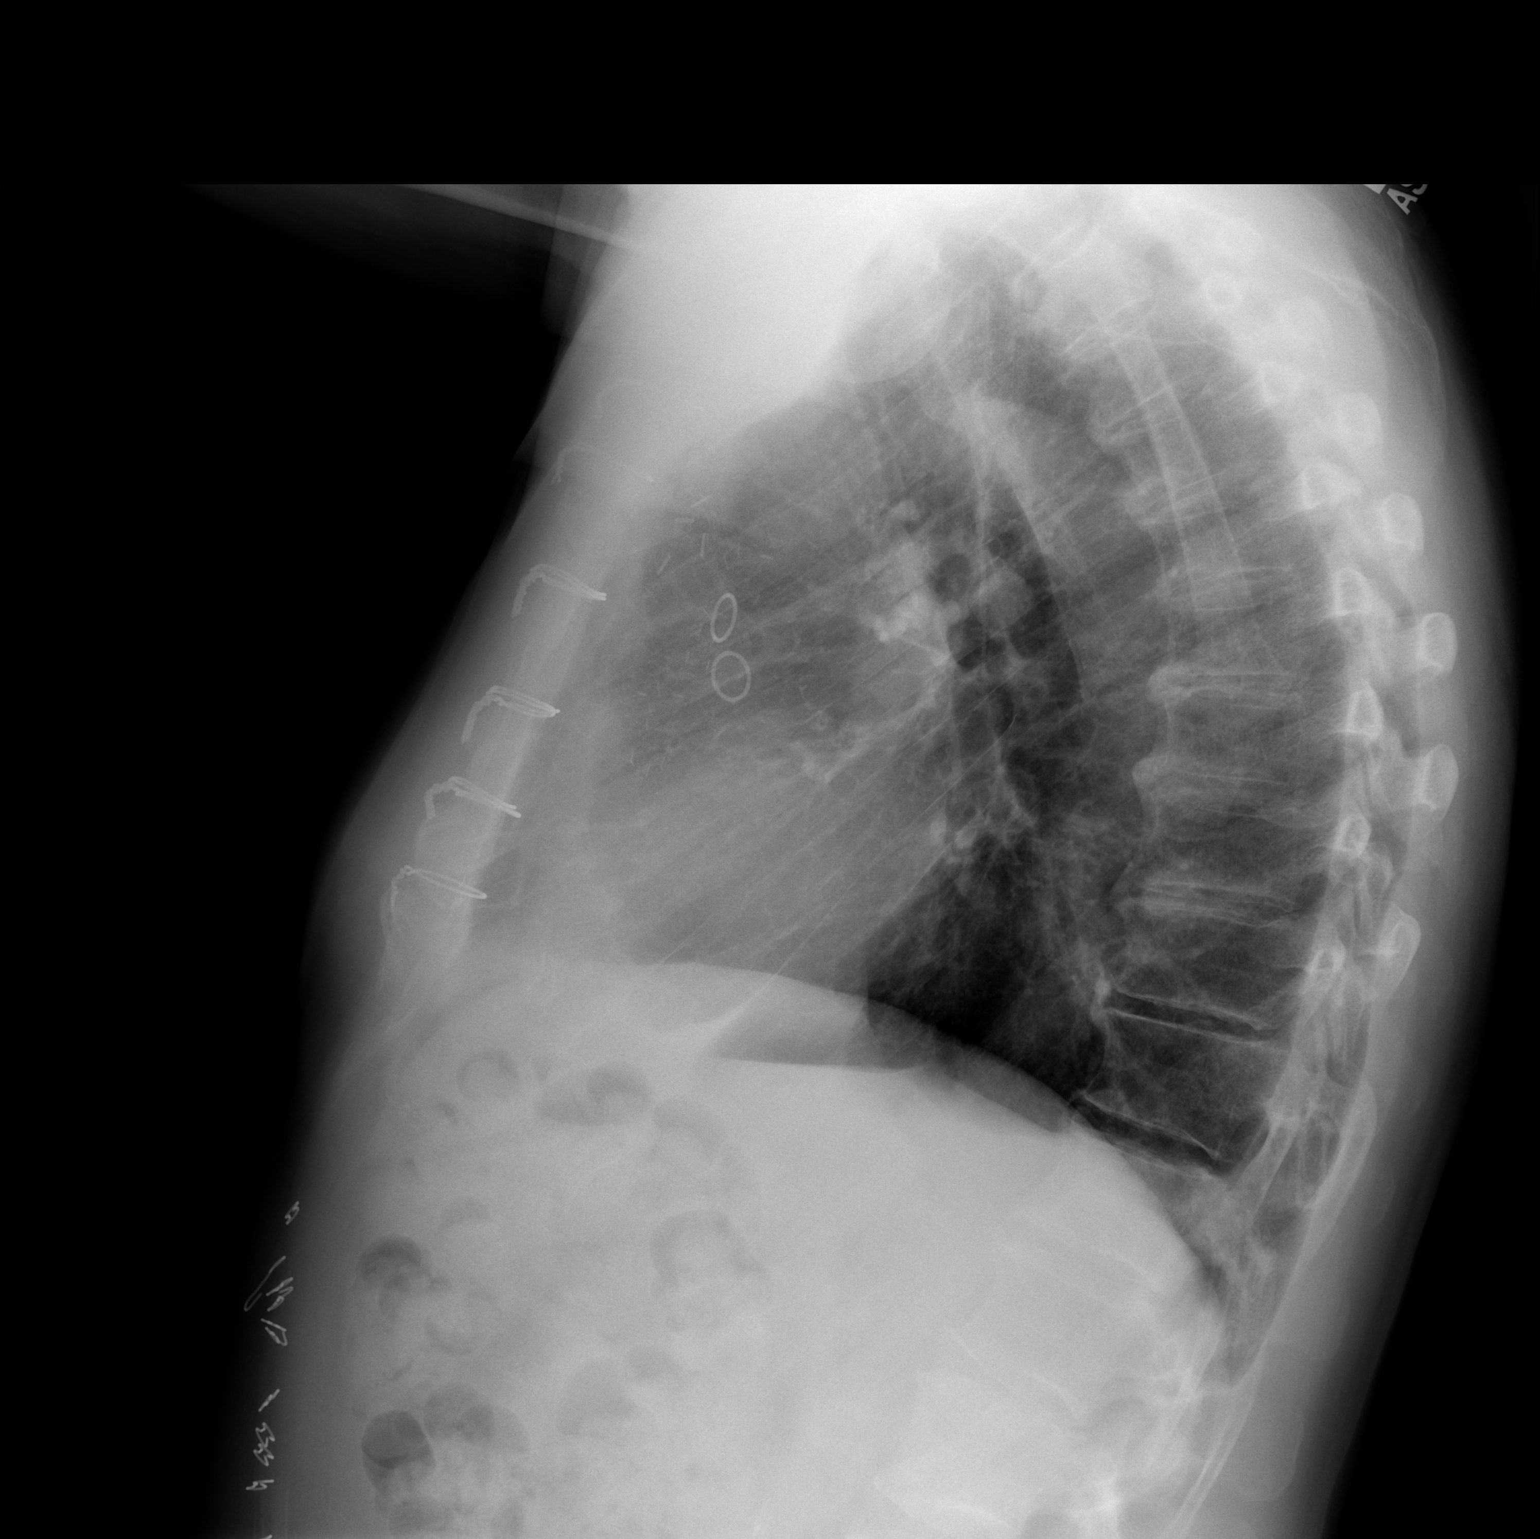

[2 of 2 positions shown; findings below may reference images not displayed]

FINDINGS: There is a persistent small left pleural effusion.  No
acute consolidative airspace disease.  No evidence of pulmonary
edema.  Heart size is normal.  Mediastinal contours are
unremarkable.  Status post median sternotomy for CABG.  Well-
defined high density nodule in the lateral aspect of the right mid
hemithorax is unchanged and compatible with a calcified granuloma.
Orthopedic fixation hardware in the lower cervical spine is
incompletely visualized.  Multiple midline abdominal staples.
IMPRESSION: 1.  Persistent small left pleural effusion.
2.  No other radiographic evidence of acute cardiopulmonary
disease.
3.  Postoperative changes, as above.

## 2015-06-10 IMAGING — CT CT HEAD W/O CM
2 series · 16 of 30 positions shown, 20 images · non-contrast
Comparison: None.

EXAM:
CT HEAD WITHOUT CONTRAST
TECHNIQUE: Contiguous axial images were obtained from the base of the skull
through the vertex without intravenous contrast.

[Series 4: head w/o · axial · non-contrast · 0.49mm/px · z∈[+14,+149]mm · 13 of 33 slices shown, 17 images]
[im 3/33  brain]
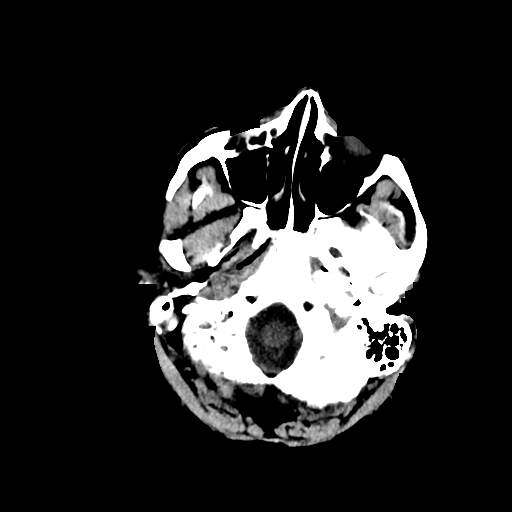
[im 3/33  bone]
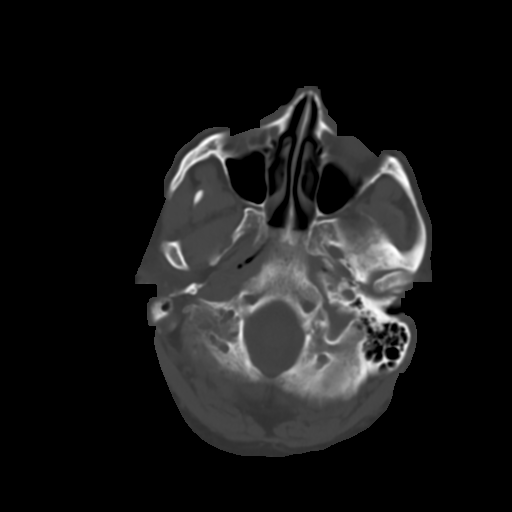
[im 5/33  brain]
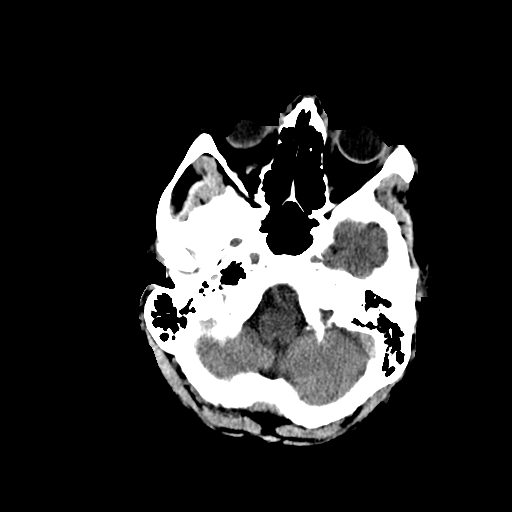
[im 7/33  brain]
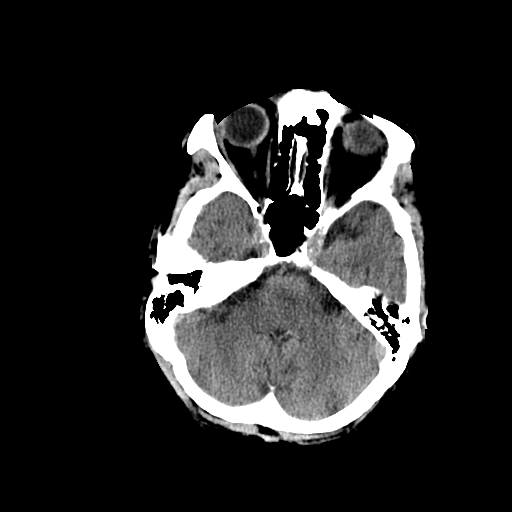
[im 10/33  brain]
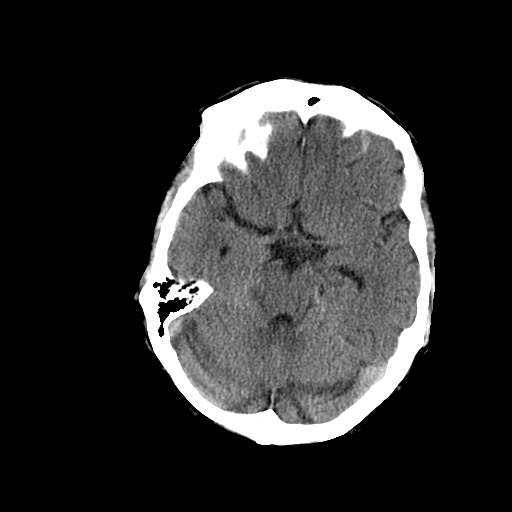
[im 12/33  brain]
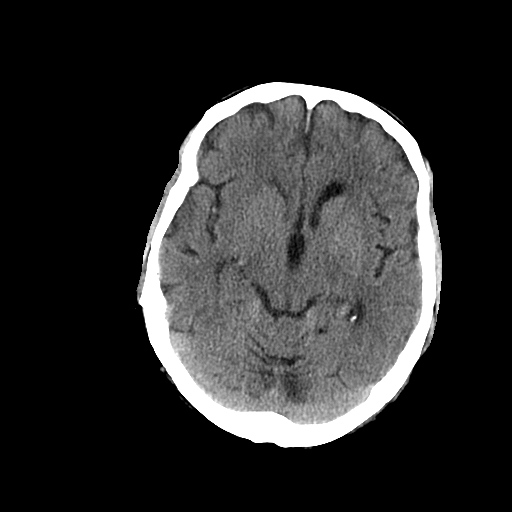
[im 12/33  bone]
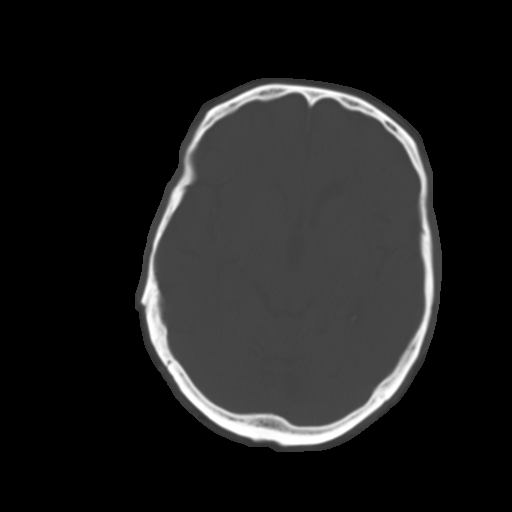
[im 14/33  brain]
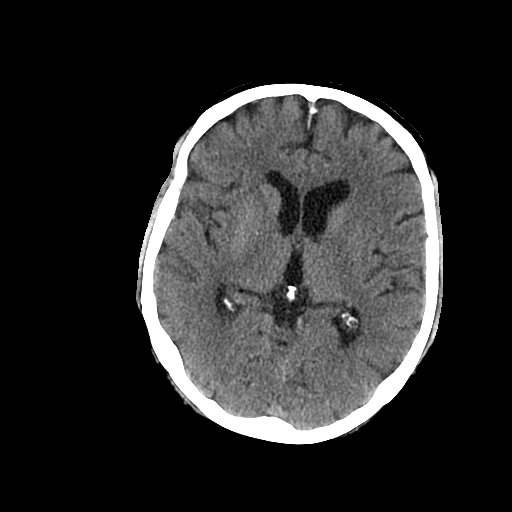
[im 17/33  brain]
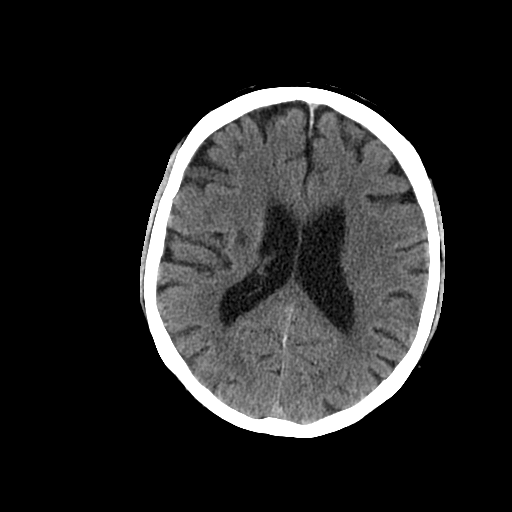
[im 19/33  brain]
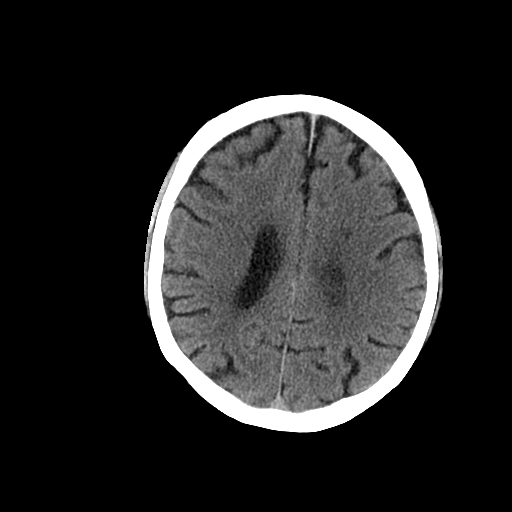
[im 21/33  brain]
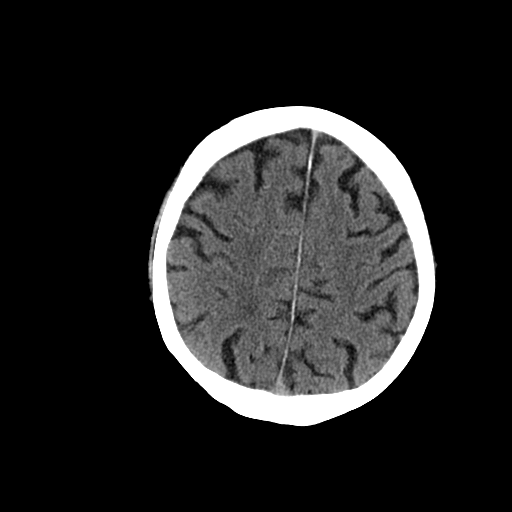
[im 21/33  bone]
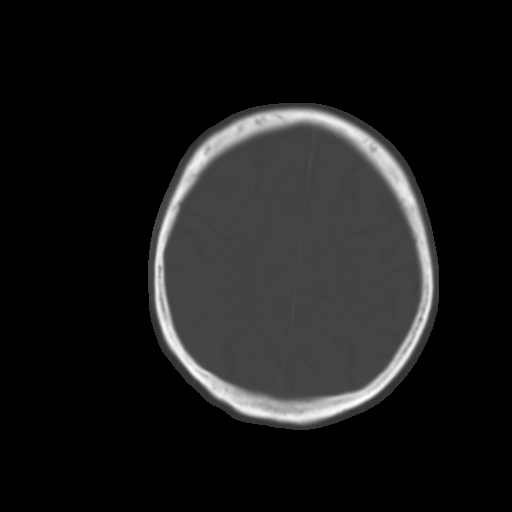
[im 23/33  brain]
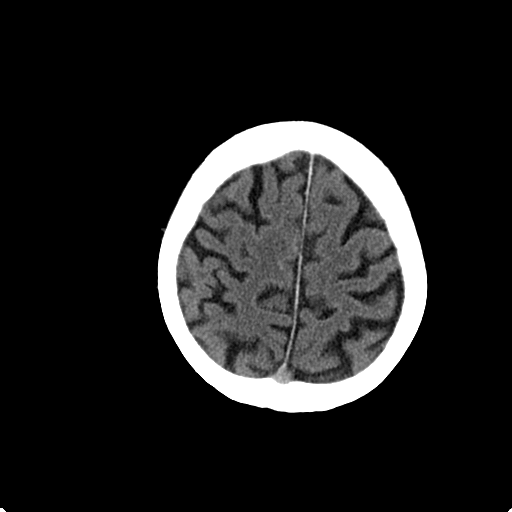
[im 26/33  brain]
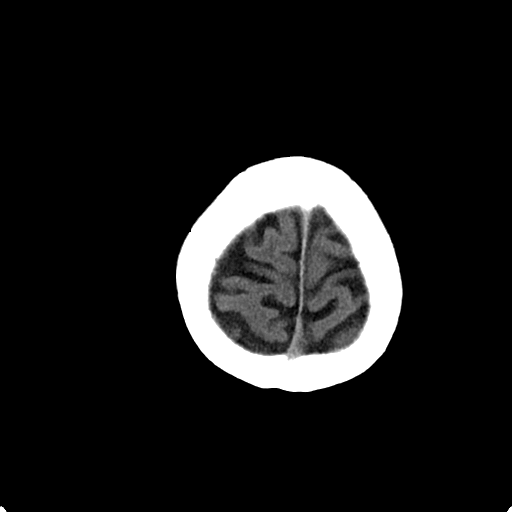
[im 28/33  brain]
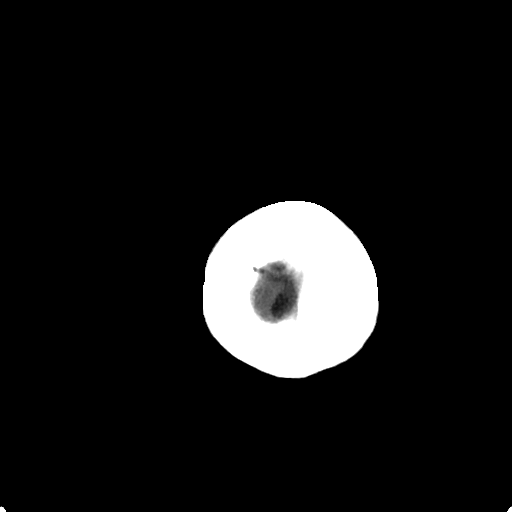
[im 30/33  brain]
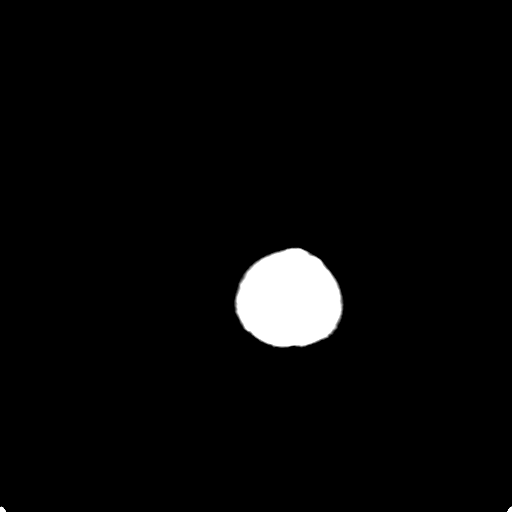
[im 30/33  bone]
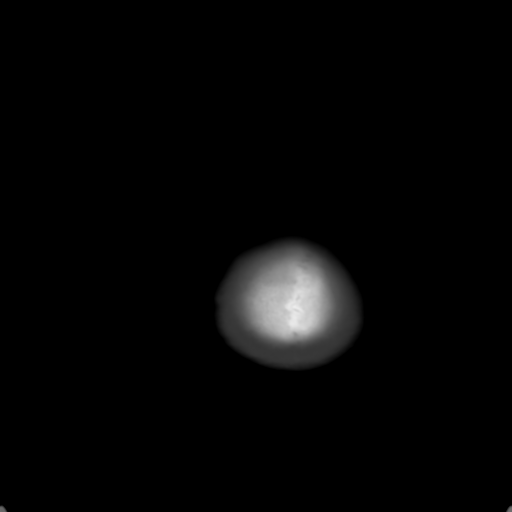

[Series 5: head w/o bone · axial · non-contrast · 0.49mm/px · z∈[+14,+59]mm · 3 of 33 slices shown]
[im 3/33  bone]
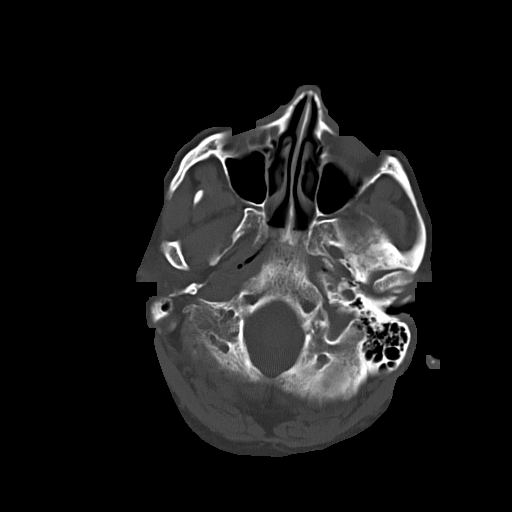
[im 7/33  bone]
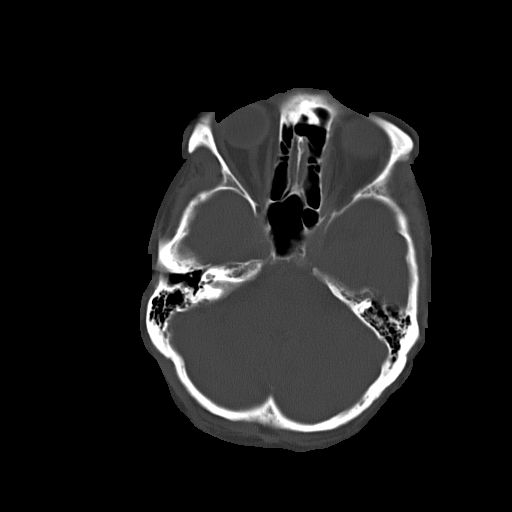
[im 12/33  bone]
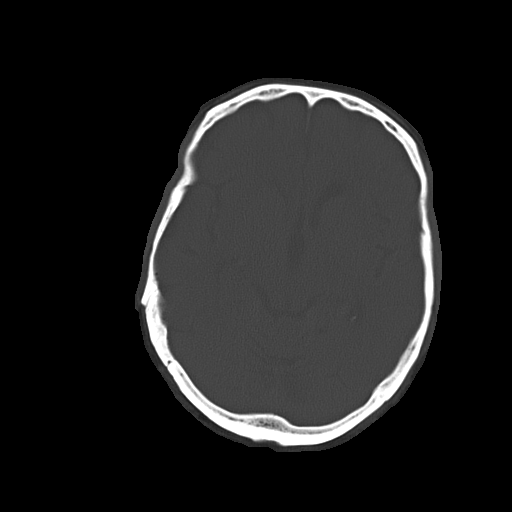

[16 of 30 positions shown; findings below may reference images not displayed]

FINDINGS: No acute intracranial hemorrhage. No focal mass lesion. No CT
evidence of acute infarction. No midline shift or mass effect. No
hydrocephalus. Basilar cisterns are patent.

There is a lacune infarction within the right basal ganglia. Mild
generalized cortical atrophy.

Paranasal sinuses and  mastoid air cells are clear.
IMPRESSION: 1. No acute intracranial hemorrhage.
2. No CT evidence of acute infarction.
3. Remote lacunar infarction in the  right basal ganglia.
4. Atrophy.
Findings conveyed toDr.  Moatshe On 08/06/2013  at[DATE].

## 2016-01-22 ENCOUNTER — Other Ambulatory Visit: Payer: Self-pay | Admitting: Nurse Practitioner
